# Patient Record
Sex: Female | Born: 1950 | Race: White | Hispanic: No | Marital: Single | Smoking: Current every day smoker
Health system: Southern US, Community
[De-identification: ages and names within clinical notes are randomized; demographics above are authoritative.]

## PROBLEM LIST (undated history)

## (undated) DIAGNOSIS — M797 Fibromyalgia: Secondary | ICD-10-CM

## (undated) DIAGNOSIS — D62 Acute posthemorrhagic anemia: Secondary | ICD-10-CM

## (undated) DIAGNOSIS — Z8701 Personal history of pneumonia (recurrent): Secondary | ICD-10-CM

## (undated) DIAGNOSIS — I499 Cardiac arrhythmia, unspecified: Secondary | ICD-10-CM

## (undated) DIAGNOSIS — R61 Generalized hyperhidrosis: Secondary | ICD-10-CM

## (undated) DIAGNOSIS — I471 Supraventricular tachycardia, unspecified: Secondary | ICD-10-CM

## (undated) DIAGNOSIS — M1990 Unspecified osteoarthritis, unspecified site: Secondary | ICD-10-CM

## (undated) DIAGNOSIS — K922 Gastrointestinal hemorrhage, unspecified: Secondary | ICD-10-CM

## (undated) DIAGNOSIS — J449 Chronic obstructive pulmonary disease, unspecified: Secondary | ICD-10-CM

## (undated) DIAGNOSIS — M545 Low back pain, unspecified: Secondary | ICD-10-CM

## (undated) DIAGNOSIS — I2510 Atherosclerotic heart disease of native coronary artery without angina pectoris: Secondary | ICD-10-CM

## (undated) DIAGNOSIS — J439 Emphysema, unspecified: Secondary | ICD-10-CM

## (undated) DIAGNOSIS — I5189 Other ill-defined heart diseases: Secondary | ICD-10-CM

## (undated) DIAGNOSIS — T8859XA Other complications of anesthesia, initial encounter: Secondary | ICD-10-CM

## (undated) DIAGNOSIS — K5792 Diverticulitis of intestine, part unspecified, without perforation or abscess without bleeding: Secondary | ICD-10-CM

## (undated) DIAGNOSIS — Z973 Presence of spectacles and contact lenses: Secondary | ICD-10-CM

## (undated) DIAGNOSIS — F32A Depression, unspecified: Secondary | ICD-10-CM

## (undated) DIAGNOSIS — G4730 Sleep apnea, unspecified: Secondary | ICD-10-CM

## (undated) DIAGNOSIS — R768 Other specified abnormal immunological findings in serum: Secondary | ICD-10-CM

## (undated) DIAGNOSIS — T4145XA Adverse effect of unspecified anesthetic, initial encounter: Secondary | ICD-10-CM

## (undated) DIAGNOSIS — F410 Panic disorder [episodic paroxysmal anxiety] without agoraphobia: Secondary | ICD-10-CM

## (undated) DIAGNOSIS — Z780 Asymptomatic menopausal state: Secondary | ICD-10-CM

## (undated) DIAGNOSIS — R7689 Other specified abnormal immunological findings in serum: Secondary | ICD-10-CM

## (undated) DIAGNOSIS — I10 Essential (primary) hypertension: Secondary | ICD-10-CM

## (undated) DIAGNOSIS — R0602 Shortness of breath: Secondary | ICD-10-CM

## (undated) DIAGNOSIS — J42 Unspecified chronic bronchitis: Secondary | ICD-10-CM

## (undated) DIAGNOSIS — I7410 Embolism and thrombosis of unspecified parts of aorta: Secondary | ICD-10-CM

## (undated) DIAGNOSIS — T7840XA Allergy, unspecified, initial encounter: Secondary | ICD-10-CM

## (undated) DIAGNOSIS — R0902 Hypoxemia: Secondary | ICD-10-CM

## (undated) DIAGNOSIS — Z9289 Personal history of other medical treatment: Secondary | ICD-10-CM

## (undated) DIAGNOSIS — K449 Diaphragmatic hernia without obstruction or gangrene: Secondary | ICD-10-CM

## (undated) DIAGNOSIS — R519 Headache, unspecified: Secondary | ICD-10-CM

## (undated) DIAGNOSIS — Z9981 Dependence on supplemental oxygen: Secondary | ICD-10-CM

## (undated) DIAGNOSIS — E785 Hyperlipidemia, unspecified: Secondary | ICD-10-CM

## (undated) DIAGNOSIS — F329 Major depressive disorder, single episode, unspecified: Secondary | ICD-10-CM

## (undated) DIAGNOSIS — K5790 Diverticulosis of intestine, part unspecified, without perforation or abscess without bleeding: Secondary | ICD-10-CM

## (undated) DIAGNOSIS — T148XXA Other injury of unspecified body region, initial encounter: Secondary | ICD-10-CM

## (undated) DIAGNOSIS — Z972 Presence of dental prosthetic device (complete) (partial): Secondary | ICD-10-CM

## (undated) DIAGNOSIS — R51 Headache: Secondary | ICD-10-CM

## (undated) DIAGNOSIS — C801 Malignant (primary) neoplasm, unspecified: Secondary | ICD-10-CM

## (undated) DIAGNOSIS — G8929 Other chronic pain: Secondary | ICD-10-CM

## (undated) DIAGNOSIS — B1920 Unspecified viral hepatitis C without hepatic coma: Secondary | ICD-10-CM

## (undated) HISTORY — DX: Hypoxemia: R09.02

## (undated) HISTORY — DX: Generalized hyperhidrosis: R61

## (undated) HISTORY — PX: ANKLE FRACTURE SURGERY: SHX122

## (undated) HISTORY — DX: Presence of spectacles and contact lenses: Z97.3

## (undated) HISTORY — PX: EYE SURGERY: SHX253

## (undated) HISTORY — DX: Sleep apnea, unspecified: G47.30

## (undated) HISTORY — PX: ABDOMINAL HERNIA REPAIR: SHX539

## (undated) HISTORY — DX: Cardiac arrhythmia, unspecified: I49.9

## (undated) HISTORY — DX: Panic disorder (episodic paroxysmal anxiety): F41.0

## (undated) HISTORY — DX: Diverticulitis of intestine, part unspecified, without perforation or abscess without bleeding: K57.92

## (undated) HISTORY — PX: COLON SURGERY: SHX602

## (undated) HISTORY — DX: Asymptomatic menopausal state: Z78.0

## (undated) HISTORY — DX: Diverticulosis of intestine, part unspecified, without perforation or abscess without bleeding: K57.90

## (undated) HISTORY — DX: Emphysema, unspecified: J43.9

## (undated) HISTORY — DX: Allergy, unspecified, initial encounter: T78.40XA

## (undated) HISTORY — DX: Presence of dental prosthetic device (complete) (partial): Z97.2

## (undated) HISTORY — DX: Essential (primary) hypertension: I10

## (undated) HISTORY — DX: Shortness of breath: R06.02

## (undated) HISTORY — DX: Hyperlipidemia, unspecified: E78.5

## (undated) HISTORY — DX: Unspecified osteoarthritis, unspecified site: M19.90

## (undated) HISTORY — DX: Other injury of unspecified body region, initial encounter: T14.8XXA

## (undated) HISTORY — DX: Unspecified viral hepatitis C without hepatic coma: B19.20

## (undated) HISTORY — PX: HERNIA REPAIR: SHX51

## (undated) HISTORY — PX: ABDOMINAL HYSTERECTOMY: SHX81

## (undated) HISTORY — DX: Diaphragmatic hernia without obstruction or gangrene: K44.9

---
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
## 1969-07-11 HISTORY — PX: APPENDECTOMY: SHX54

## 1991-11-11 DIAGNOSIS — B1920 Unspecified viral hepatitis C without hepatic coma: Secondary | ICD-10-CM

## 1991-11-11 HISTORY — DX: Unspecified viral hepatitis C without hepatic coma: B19.20

## 1997-11-10 HISTORY — PX: ELBOW SURGERY: SHX618

## 1999-11-11 DIAGNOSIS — E785 Hyperlipidemia, unspecified: Secondary | ICD-10-CM

## 1999-11-11 DIAGNOSIS — I10 Essential (primary) hypertension: Secondary | ICD-10-CM

## 1999-11-11 DIAGNOSIS — G4730 Sleep apnea, unspecified: Secondary | ICD-10-CM

## 1999-11-11 HISTORY — DX: Hyperlipidemia, unspecified: E78.5

## 1999-11-11 HISTORY — DX: Essential (primary) hypertension: I10

## 1999-11-11 HISTORY — DX: Sleep apnea, unspecified: G47.30

## 2001-11-10 HISTORY — PX: HIATAL HERNIA REPAIR: SHX195

## 2002-11-10 HISTORY — PX: FRACTURE SURGERY: SHX138

## 2003-12-12 ENCOUNTER — Other Ambulatory Visit: Admission: RE | Admit: 2003-12-12 | Discharge: 2003-12-12 | Payer: Self-pay | Admitting: Dermatology

## 2004-09-25 ENCOUNTER — Encounter: Payer: Self-pay | Admitting: Occupational Therapy

## 2004-10-10 ENCOUNTER — Encounter: Payer: Self-pay | Admitting: Occupational Therapy

## 2004-10-15 ENCOUNTER — Ambulatory Visit: Payer: Self-pay | Admitting: Occupational Therapy

## 2004-11-10 DIAGNOSIS — T148XXA Other injury of unspecified body region, initial encounter: Secondary | ICD-10-CM

## 2004-11-10 HISTORY — DX: Other injury of unspecified body region, initial encounter: T14.8XXA

## 2005-01-08 HISTORY — PX: TOTAL ABDOMINAL HYSTERECTOMY W/ BILATERAL SALPINGOOPHORECTOMY: SHX83

## 2005-01-12 ENCOUNTER — Emergency Department (HOSPITAL_COMMUNITY): Admission: EM | Admit: 2005-01-12 | Discharge: 2005-01-12 | Payer: Self-pay | Admitting: Obstetrics and Gynecology

## 2005-02-06 ENCOUNTER — Inpatient Hospital Stay (HOSPITAL_COMMUNITY): Admission: RE | Admit: 2005-02-06 | Discharge: 2005-02-08 | Payer: Self-pay | Admitting: Obstetrics and Gynecology

## 2005-03-06 ENCOUNTER — Encounter: Payer: Self-pay | Admitting: Orthopaedic Surgery

## 2005-03-10 ENCOUNTER — Encounter: Payer: Self-pay | Admitting: Orthopaedic Surgery

## 2005-08-05 ENCOUNTER — Ambulatory Visit (HOSPITAL_COMMUNITY): Admission: RE | Admit: 2005-08-05 | Discharge: 2005-08-05 | Payer: Self-pay | Admitting: Occupational Therapy

## 2005-09-11 ENCOUNTER — Emergency Department (HOSPITAL_COMMUNITY): Admission: EM | Admit: 2005-09-11 | Discharge: 2005-09-11 | Payer: Self-pay | Admitting: Emergency Medicine

## 2005-12-23 ENCOUNTER — Ambulatory Visit (HOSPITAL_COMMUNITY): Admission: RE | Admit: 2005-12-23 | Discharge: 2005-12-23 | Payer: Self-pay | Admitting: Family Medicine

## 2006-08-14 ENCOUNTER — Ambulatory Visit (HOSPITAL_COMMUNITY): Admission: EM | Admit: 2006-08-14 | Discharge: 2006-08-14 | Payer: Self-pay | Admitting: Emergency Medicine

## 2006-08-26 ENCOUNTER — Ambulatory Visit (HOSPITAL_COMMUNITY): Admission: RE | Admit: 2006-08-26 | Discharge: 2006-08-26 | Payer: Self-pay | Admitting: Family Medicine

## 2006-10-13 ENCOUNTER — Ambulatory Visit (HOSPITAL_COMMUNITY): Admission: RE | Admit: 2006-10-13 | Discharge: 2006-10-13 | Payer: Self-pay | Admitting: Family Medicine

## 2006-11-10 DIAGNOSIS — K5790 Diverticulosis of intestine, part unspecified, without perforation or abscess without bleeding: Secondary | ICD-10-CM

## 2006-11-10 HISTORY — DX: Diverticulosis of intestine, part unspecified, without perforation or abscess without bleeding: K57.90

## 2007-03-23 ENCOUNTER — Ambulatory Visit (HOSPITAL_COMMUNITY): Admission: RE | Admit: 2007-03-23 | Discharge: 2007-03-23 | Payer: Self-pay | Admitting: Family Medicine

## 2007-04-19 ENCOUNTER — Ambulatory Visit (HOSPITAL_COMMUNITY): Admission: RE | Admit: 2007-04-19 | Discharge: 2007-04-19 | Payer: Self-pay | Admitting: Family Medicine

## 2007-11-02 ENCOUNTER — Ambulatory Visit (HOSPITAL_COMMUNITY): Admission: RE | Admit: 2007-11-02 | Discharge: 2007-11-02 | Payer: Self-pay | Admitting: Otolaryngology

## 2007-11-11 HISTORY — PX: OTHER SURGICAL HISTORY: SHX169

## 2007-11-11 HISTORY — PX: PARTIAL COLECTOMY: SHX5273

## 2008-01-09 HISTORY — PX: ESOPHAGOGASTRODUODENOSCOPY: SHX1529

## 2008-01-14 ENCOUNTER — Ambulatory Visit: Payer: Self-pay | Admitting: Gastroenterology

## 2008-01-19 ENCOUNTER — Encounter: Payer: Self-pay | Admitting: Gastroenterology

## 2008-01-19 ENCOUNTER — Ambulatory Visit (HOSPITAL_COMMUNITY): Admission: RE | Admit: 2008-01-19 | Discharge: 2008-01-19 | Payer: Self-pay | Admitting: Gastroenterology

## 2008-01-19 ENCOUNTER — Ambulatory Visit: Payer: Self-pay | Admitting: Gastroenterology

## 2008-01-21 ENCOUNTER — Ambulatory Visit: Payer: Self-pay | Admitting: Internal Medicine

## 2008-01-27 ENCOUNTER — Ambulatory Visit: Payer: Self-pay | Admitting: Gastroenterology

## 2008-06-04 ENCOUNTER — Emergency Department (HOSPITAL_COMMUNITY): Admission: EM | Admit: 2008-06-04 | Discharge: 2008-06-04 | Payer: Self-pay | Admitting: Emergency Medicine

## 2008-06-27 ENCOUNTER — Ambulatory Visit: Payer: Self-pay | Admitting: Gastroenterology

## 2008-07-04 ENCOUNTER — Ambulatory Visit (HOSPITAL_COMMUNITY): Admission: RE | Admit: 2008-07-04 | Discharge: 2008-07-04 | Payer: Self-pay | Admitting: Gastroenterology

## 2008-07-11 HISTORY — PX: COLONOSCOPY: SHX174

## 2008-07-14 ENCOUNTER — Ambulatory Visit: Payer: Self-pay | Admitting: Gastroenterology

## 2008-07-14 ENCOUNTER — Ambulatory Visit (HOSPITAL_COMMUNITY): Admission: RE | Admit: 2008-07-14 | Discharge: 2008-07-14 | Payer: Self-pay | Admitting: Gastroenterology

## 2008-07-14 ENCOUNTER — Encounter: Payer: Self-pay | Admitting: Gastroenterology

## 2008-07-14 LAB — HM COLONOSCOPY

## 2008-08-07 ENCOUNTER — Inpatient Hospital Stay (HOSPITAL_COMMUNITY): Admission: RE | Admit: 2008-08-07 | Discharge: 2008-08-12 | Payer: Self-pay | Admitting: General Surgery

## 2008-08-07 ENCOUNTER — Encounter (INDEPENDENT_AMBULATORY_CARE_PROVIDER_SITE_OTHER): Payer: Self-pay | Admitting: General Surgery

## 2008-09-18 DIAGNOSIS — R1310 Dysphagia, unspecified: Secondary | ICD-10-CM | POA: Insufficient documentation

## 2008-09-18 DIAGNOSIS — G4733 Obstructive sleep apnea (adult) (pediatric): Secondary | ICD-10-CM

## 2008-09-18 DIAGNOSIS — Z8619 Personal history of other infectious and parasitic diseases: Secondary | ICD-10-CM | POA: Insufficient documentation

## 2008-09-18 DIAGNOSIS — M129 Arthropathy, unspecified: Secondary | ICD-10-CM | POA: Insufficient documentation

## 2008-09-18 DIAGNOSIS — E785 Hyperlipidemia, unspecified: Secondary | ICD-10-CM

## 2008-09-18 DIAGNOSIS — K2970 Gastritis, unspecified, without bleeding: Secondary | ICD-10-CM | POA: Insufficient documentation

## 2008-09-18 DIAGNOSIS — J45909 Unspecified asthma, uncomplicated: Secondary | ICD-10-CM | POA: Insufficient documentation

## 2008-09-18 DIAGNOSIS — Z8679 Personal history of other diseases of the circulatory system: Secondary | ICD-10-CM | POA: Insufficient documentation

## 2008-09-18 DIAGNOSIS — K449 Diaphragmatic hernia without obstruction or gangrene: Secondary | ICD-10-CM | POA: Insufficient documentation

## 2008-09-18 DIAGNOSIS — K219 Gastro-esophageal reflux disease without esophagitis: Secondary | ICD-10-CM | POA: Insufficient documentation

## 2008-09-25 ENCOUNTER — Emergency Department (HOSPITAL_COMMUNITY): Admission: EM | Admit: 2008-09-25 | Discharge: 2008-09-25 | Payer: Self-pay | Admitting: Emergency Medicine

## 2008-12-06 ENCOUNTER — Ambulatory Visit: Payer: Self-pay | Admitting: Family Medicine

## 2008-12-06 DIAGNOSIS — F1011 Alcohol abuse, in remission: Secondary | ICD-10-CM | POA: Insufficient documentation

## 2008-12-06 DIAGNOSIS — B1710 Acute hepatitis C without hepatic coma: Secondary | ICD-10-CM

## 2008-12-06 DIAGNOSIS — R5383 Other fatigue: Secondary | ICD-10-CM

## 2008-12-06 DIAGNOSIS — M79609 Pain in unspecified limb: Secondary | ICD-10-CM | POA: Insufficient documentation

## 2008-12-06 DIAGNOSIS — R519 Headache, unspecified: Secondary | ICD-10-CM | POA: Insufficient documentation

## 2008-12-06 DIAGNOSIS — F17200 Nicotine dependence, unspecified, uncomplicated: Secondary | ICD-10-CM

## 2008-12-06 DIAGNOSIS — R51 Headache: Secondary | ICD-10-CM

## 2008-12-06 DIAGNOSIS — R5381 Other malaise: Secondary | ICD-10-CM | POA: Insufficient documentation

## 2008-12-06 DIAGNOSIS — I10 Essential (primary) hypertension: Secondary | ICD-10-CM | POA: Insufficient documentation

## 2008-12-11 ENCOUNTER — Telehealth: Payer: Self-pay | Admitting: Family Medicine

## 2008-12-14 ENCOUNTER — Ambulatory Visit: Payer: Self-pay | Admitting: Family Medicine

## 2008-12-14 DIAGNOSIS — G4700 Insomnia, unspecified: Secondary | ICD-10-CM

## 2008-12-14 DIAGNOSIS — F411 Generalized anxiety disorder: Secondary | ICD-10-CM | POA: Insufficient documentation

## 2008-12-18 ENCOUNTER — Encounter: Payer: Self-pay | Admitting: Family Medicine

## 2008-12-19 LAB — CONVERTED CEMR LAB
AST: 17 units/L (ref 0–37)
Albumin: 4.5 g/dL (ref 3.5–5.2)
Alkaline Phosphatase: 75 units/L (ref 39–117)
BUN: 12 mg/dL (ref 6–23)
Basophils Absolute: 0 10*3/uL (ref 0.0–0.1)
Basophils Relative: 0 % (ref 0–1)
Bilirubin, Direct: 0.1 mg/dL (ref 0.0–0.3)
CO2: 23 meq/L (ref 19–32)
Calcium: 9.8 mg/dL (ref 8.4–10.5)
Cholesterol: 188 mg/dL (ref 0–200)
Creatinine, Ser: 0.71 mg/dL (ref 0.40–1.20)
Eosinophils Relative: 2 % (ref 0–5)
HCT: 45.9 % (ref 36.0–46.0)
HDL: 47 mg/dL (ref >39)
LDL Cholesterol: 104 mg/dL — ABNORMAL HIGH (ref 0–99)
Lymphocytes Relative: 18 % (ref 12–46)
MCHC: 32.5 g/dL (ref 30.0–36.0)
Monocytes Absolute: 0.9 10*3/uL (ref 0.1–1.0)
RBC: 4.86 M/uL (ref 3.87–5.11)
RDW: 15.1 % (ref 11.5–15.5)
Sodium: 141 meq/L (ref 135–145)
TSH: 2.128 microintl units/mL (ref 0.350–4.50)
Total CHOL/HDL Ratio: 4
Triglycerides: 187 mg/dL — ABNORMAL HIGH (ref <150)

## 2008-12-26 ENCOUNTER — Encounter: Payer: Self-pay | Admitting: Family Medicine

## 2009-01-01 ENCOUNTER — Ambulatory Visit (HOSPITAL_COMMUNITY): Admission: RE | Admit: 2009-01-01 | Discharge: 2009-01-01 | Payer: Self-pay | Admitting: Neurology

## 2009-01-02 ENCOUNTER — Encounter: Payer: Self-pay | Admitting: Family Medicine

## 2009-01-17 ENCOUNTER — Encounter: Payer: Self-pay | Admitting: Family Medicine

## 2009-01-31 ENCOUNTER — Ambulatory Visit (HOSPITAL_COMMUNITY): Admission: RE | Admit: 2009-01-31 | Discharge: 2009-01-31 | Payer: Self-pay | Admitting: General Surgery

## 2009-01-31 ENCOUNTER — Encounter: Payer: Self-pay | Admitting: Family Medicine

## 2009-01-31 HISTORY — PX: UMBILICAL HERNIA REPAIR: SHX196

## 2009-02-16 ENCOUNTER — Ambulatory Visit: Payer: Self-pay | Admitting: Family Medicine

## 2009-02-26 ENCOUNTER — Encounter: Payer: Self-pay | Admitting: Family Medicine

## 2009-04-11 ENCOUNTER — Ambulatory Visit: Payer: Self-pay | Admitting: Family Medicine

## 2009-04-11 DIAGNOSIS — M2550 Pain in unspecified joint: Secondary | ICD-10-CM | POA: Insufficient documentation

## 2009-04-11 DIAGNOSIS — IMO0001 Reserved for inherently not codable concepts without codable children: Secondary | ICD-10-CM | POA: Insufficient documentation

## 2009-04-16 LAB — CONVERTED CEMR LAB
ALT: 9 units/L (ref 0–35)
AST: 14 units/L (ref 0–37)
Albumin: 4.2 g/dL (ref 3.5–5.2)
Alkaline Phosphatase: 64 units/L (ref 39–117)
Bilirubin, Direct: <0.1 mg/dL (ref 0.0–0.3)
Chloride: 106 meq/L (ref 96–112)
Magnesium: 1.9 mg/dL (ref 1.5–2.5)
Rhuematoid fact SerPl-aCnc: <20 intl units/mL (ref 0–20)
TSH: 1.033 microintl units/mL (ref 0.350–4.500)
Total Bilirubin: 0.3 mg/dL (ref 0.3–1.2)
Total Protein: 7.1 g/dL (ref 6.0–8.3)

## 2009-04-17 ENCOUNTER — Ambulatory Visit: Payer: Self-pay | Admitting: Family Medicine

## 2009-04-17 DIAGNOSIS — E739 Lactose intolerance, unspecified: Secondary | ICD-10-CM

## 2009-06-13 ENCOUNTER — Encounter: Payer: Self-pay | Admitting: Family Medicine

## 2009-06-14 LAB — CONVERTED CEMR LAB
ALT: 12 units/L (ref 0–35)
Albumin: 4.4 g/dL (ref 3.5–5.2)
Cholesterol: 140 mg/dL (ref 0–200)
HDL: 46 mg/dL (ref >39)
Total CHOL/HDL Ratio: 3
Total Protein: 7 g/dL (ref 6.0–8.3)
Triglycerides: 108 mg/dL (ref <150)
VLDL: 22 mg/dL (ref 0–40)

## 2009-06-18 ENCOUNTER — Ambulatory Visit: Payer: Self-pay | Admitting: Family Medicine

## 2009-07-12 ENCOUNTER — Encounter: Payer: Self-pay | Admitting: Family Medicine

## 2009-08-03 ENCOUNTER — Encounter: Payer: Self-pay | Admitting: Orthopedic Surgery

## 2009-08-20 ENCOUNTER — Ambulatory Visit: Payer: Self-pay | Admitting: Family Medicine

## 2009-08-20 ENCOUNTER — Telehealth: Payer: Self-pay | Admitting: Family Medicine

## 2009-08-20 LAB — CONVERTED CEMR LAB: Hgb A1c MFr Bld: 6.1 %

## 2009-09-12 ENCOUNTER — Ambulatory Visit: Payer: Self-pay | Admitting: Family Medicine

## 2009-09-12 DIAGNOSIS — T7840XA Allergy, unspecified, initial encounter: Secondary | ICD-10-CM | POA: Insufficient documentation

## 2009-10-19 ENCOUNTER — Encounter (INDEPENDENT_AMBULATORY_CARE_PROVIDER_SITE_OTHER): Payer: Self-pay | Admitting: *Deleted

## 2009-10-19 LAB — CONVERTED CEMR LAB
ALT: 11 units/L (ref 0–35)
AST: 15 units/L (ref 0–37)
Albumin: 4.7 g/dL
Bilirubin, Direct: 0.1 mg/dL
Bilirubin, Direct: 0.1 mg/dL (ref 0.0–0.3)
CO2: 25 meq/L
Calcium: 10 mg/dL (ref 8.4–10.5)
Cholesterol: 160 mg/dL
Cholesterol: 160 mg/dL (ref 0–200)
Glucose, Bld: 93 mg/dL
Glucose, Bld: 93 mg/dL (ref 70–99)
HDL: 53 mg/dL (ref >39)
Sodium: 141 meq/L
Sodium: 141 meq/L (ref 135–145)
Total CHOL/HDL Ratio: 3
Total Protein: 7.2 g/dL
Triglycerides: 99 mg/dL (ref <150)

## 2009-10-22 ENCOUNTER — Ambulatory Visit: Payer: Self-pay | Admitting: Family Medicine

## 2009-10-22 DIAGNOSIS — R209 Unspecified disturbances of skin sensation: Secondary | ICD-10-CM | POA: Insufficient documentation

## 2009-10-22 DIAGNOSIS — M542 Cervicalgia: Secondary | ICD-10-CM | POA: Insufficient documentation

## 2009-10-22 DIAGNOSIS — G8929 Other chronic pain: Secondary | ICD-10-CM | POA: Insufficient documentation

## 2009-10-23 ENCOUNTER — Encounter: Payer: Self-pay | Admitting: Family Medicine

## 2009-10-31 ENCOUNTER — Encounter: Payer: Self-pay | Admitting: Family Medicine

## 2009-11-08 ENCOUNTER — Encounter: Payer: Self-pay | Admitting: Family Medicine

## 2009-11-12 ENCOUNTER — Ambulatory Visit: Payer: Self-pay | Admitting: Family Medicine

## 2009-11-12 DIAGNOSIS — R002 Palpitations: Secondary | ICD-10-CM

## 2009-11-14 ENCOUNTER — Ambulatory Visit (HOSPITAL_COMMUNITY): Admission: RE | Admit: 2009-11-14 | Discharge: 2009-11-14 | Payer: Self-pay | Admitting: Family Medicine

## 2009-11-19 ENCOUNTER — Encounter: Payer: Self-pay | Admitting: Family Medicine

## 2009-11-26 ENCOUNTER — Encounter: Payer: Self-pay | Admitting: Family Medicine

## 2009-12-03 ENCOUNTER — Encounter (INDEPENDENT_AMBULATORY_CARE_PROVIDER_SITE_OTHER): Payer: Self-pay | Admitting: *Deleted

## 2009-12-06 ENCOUNTER — Encounter (INDEPENDENT_AMBULATORY_CARE_PROVIDER_SITE_OTHER): Payer: Self-pay | Admitting: *Deleted

## 2009-12-06 ENCOUNTER — Ambulatory Visit (HOSPITAL_COMMUNITY): Admission: RE | Admit: 2009-12-06 | Discharge: 2009-12-06 | Payer: Self-pay | Admitting: Cardiovascular Disease

## 2009-12-06 ENCOUNTER — Ambulatory Visit: Payer: Self-pay | Admitting: Cardiovascular Disease

## 2009-12-06 DIAGNOSIS — R079 Chest pain, unspecified: Secondary | ICD-10-CM | POA: Insufficient documentation

## 2009-12-17 ENCOUNTER — Telehealth: Payer: Self-pay | Admitting: Family Medicine

## 2009-12-19 ENCOUNTER — Telehealth (INDEPENDENT_AMBULATORY_CARE_PROVIDER_SITE_OTHER): Payer: Self-pay | Admitting: *Deleted

## 2009-12-24 ENCOUNTER — Telehealth: Payer: Self-pay | Admitting: Family Medicine

## 2009-12-24 ENCOUNTER — Telehealth (INDEPENDENT_AMBULATORY_CARE_PROVIDER_SITE_OTHER): Payer: Self-pay | Admitting: *Deleted

## 2009-12-26 ENCOUNTER — Encounter: Payer: Self-pay | Admitting: Cardiovascular Disease

## 2009-12-31 ENCOUNTER — Telehealth (INDEPENDENT_AMBULATORY_CARE_PROVIDER_SITE_OTHER): Payer: Self-pay | Admitting: *Deleted

## 2010-01-02 ENCOUNTER — Ambulatory Visit: Payer: Self-pay | Admitting: Cardiology

## 2010-01-03 ENCOUNTER — Encounter: Payer: Self-pay | Admitting: Cardiovascular Disease

## 2010-02-27 ENCOUNTER — Ambulatory Visit: Payer: Self-pay | Admitting: Cardiology

## 2010-03-06 ENCOUNTER — Telehealth (INDEPENDENT_AMBULATORY_CARE_PROVIDER_SITE_OTHER): Payer: Self-pay

## 2010-03-18 ENCOUNTER — Ambulatory Visit: Payer: Self-pay | Admitting: Cardiology

## 2010-03-19 ENCOUNTER — Encounter: Payer: Self-pay | Admitting: Adult Health

## 2010-03-19 ENCOUNTER — Encounter: Payer: Self-pay | Admitting: Cardiovascular Disease

## 2010-04-15 ENCOUNTER — Ambulatory Visit: Payer: Self-pay | Admitting: Orthopedic Surgery

## 2010-04-15 DIAGNOSIS — M19079 Primary osteoarthritis, unspecified ankle and foot: Secondary | ICD-10-CM | POA: Insufficient documentation

## 2010-06-24 ENCOUNTER — Ambulatory Visit (HOSPITAL_COMMUNITY): Admission: RE | Admit: 2010-06-24 | Discharge: 2010-06-24 | Payer: Self-pay | Admitting: Neurology

## 2010-06-24 ENCOUNTER — Ambulatory Visit: Payer: Self-pay | Admitting: Family Medicine

## 2010-06-24 DIAGNOSIS — L259 Unspecified contact dermatitis, unspecified cause: Secondary | ICD-10-CM | POA: Insufficient documentation

## 2010-06-24 DIAGNOSIS — G459 Transient cerebral ischemic attack, unspecified: Secondary | ICD-10-CM | POA: Insufficient documentation

## 2010-07-08 ENCOUNTER — Encounter: Payer: Self-pay | Admitting: Family Medicine

## 2010-07-09 LAB — CONVERTED CEMR LAB
Albumin: 4.3 g/dL (ref 3.5–5.2)
CO2: 26 meq/L (ref 19–32)
Chloride: 104 meq/L (ref 96–112)
Cholesterol: 184 mg/dL (ref 0–200)
Glucose, Bld: 84 mg/dL (ref 70–99)
MCV: 96.1 fL (ref 78.0–100.0)
Potassium: 4.5 meq/L (ref 3.5–5.3)
RBC: 4.66 M/uL (ref 3.87–5.11)
Sodium: 141 meq/L (ref 135–145)
Total Protein: 6.8 g/dL (ref 6.0–8.3)
Triglycerides: 151 mg/dL — ABNORMAL HIGH (ref <150)
WBC: 10.7 10*3/uL — ABNORMAL HIGH (ref 4.0–10.5)

## 2010-07-22 ENCOUNTER — Ambulatory Visit: Payer: Self-pay | Admitting: Family Medicine

## 2010-07-22 ENCOUNTER — Ambulatory Visit (HOSPITAL_COMMUNITY)
Admission: RE | Admit: 2010-07-22 | Discharge: 2010-07-22 | Payer: Self-pay | Source: Home / Self Care | Admitting: Neurology

## 2010-07-22 DIAGNOSIS — L600 Ingrowing nail: Secondary | ICD-10-CM | POA: Insufficient documentation

## 2010-07-22 DIAGNOSIS — R7301 Impaired fasting glucose: Secondary | ICD-10-CM | POA: Insufficient documentation

## 2010-07-31 ENCOUNTER — Ambulatory Visit: Payer: Self-pay | Admitting: Family Medicine

## 2010-07-31 DIAGNOSIS — L509 Urticaria, unspecified: Secondary | ICD-10-CM | POA: Insufficient documentation

## 2010-08-06 ENCOUNTER — Encounter: Payer: Self-pay | Admitting: Family Medicine

## 2010-08-21 ENCOUNTER — Encounter: Payer: Self-pay | Admitting: Family Medicine

## 2010-09-30 ENCOUNTER — Telehealth: Payer: Self-pay | Admitting: Family Medicine

## 2010-10-02 LAB — CONVERTED CEMR LAB
ALT: 13 units/L (ref 0–35)
Albumin: 4.4 g/dL (ref 3.5–5.2)
Alkaline Phosphatase: 63 units/L (ref 39–117)
HDL: 48 mg/dL (ref >39)
LDL Cholesterol: 93 mg/dL (ref 0–99)
Total Protein: 7 g/dL (ref 6.0–8.3)
Triglycerides: 73 mg/dL (ref <150)
VLDL: 15 mg/dL (ref 0–40)

## 2010-10-11 ENCOUNTER — Ambulatory Visit: Payer: Self-pay | Admitting: Family Medicine

## 2010-10-14 ENCOUNTER — Telehealth: Payer: Self-pay | Admitting: Family Medicine

## 2010-10-17 ENCOUNTER — Encounter (INDEPENDENT_AMBULATORY_CARE_PROVIDER_SITE_OTHER): Payer: Self-pay | Admitting: *Deleted

## 2010-10-17 ENCOUNTER — Encounter: Payer: Self-pay | Admitting: Family Medicine

## 2010-10-18 ENCOUNTER — Encounter (INDEPENDENT_AMBULATORY_CARE_PROVIDER_SITE_OTHER): Payer: Self-pay | Admitting: *Deleted

## 2010-10-21 ENCOUNTER — Encounter: Payer: Self-pay | Admitting: Family Medicine

## 2010-10-21 ENCOUNTER — Ambulatory Visit
Admission: RE | Admit: 2010-10-21 | Discharge: 2010-10-21 | Payer: Self-pay | Source: Home / Self Care | Attending: Family Medicine | Admitting: Family Medicine

## 2010-10-23 ENCOUNTER — Encounter: Payer: Self-pay | Admitting: Family Medicine

## 2010-10-25 ENCOUNTER — Ambulatory Visit: Payer: Self-pay | Admitting: Family Medicine

## 2010-10-31 ENCOUNTER — Encounter: Payer: Self-pay | Admitting: Family Medicine

## 2010-11-08 LAB — HM MAMMOGRAPHY

## 2010-11-08 LAB — HM COLONOSCOPY

## 2010-11-18 ENCOUNTER — Telehealth: Payer: Self-pay | Admitting: Family Medicine

## 2010-11-20 ENCOUNTER — Encounter: Payer: Self-pay | Admitting: Family Medicine

## 2010-11-26 ENCOUNTER — Telehealth: Payer: Self-pay | Admitting: Family Medicine

## 2010-12-01 ENCOUNTER — Encounter: Payer: Self-pay | Admitting: Cardiovascular Disease

## 2010-12-02 ENCOUNTER — Encounter: Payer: Self-pay | Admitting: Family Medicine

## 2010-12-10 NOTE — Progress Notes (Signed)
Summary: TEST  Phone Note Call from Patient   Summary of Call: CALL HER ABOUT HER STRESS TEST  CEL # [phone]  HOME [phone] Initial call taken by: Lind Guest,  December 24, 2009 8:38 AM  Follow-up for Phone Call        advised patient to call cardiologist for any questions reguarding stress test Follow-up by: Lilyan Gilford LPN,  December 24, 2009 8:43 AM

## 2010-12-10 NOTE — Progress Notes (Signed)
Summary: TREADMILL  Phone Note Call from Patient Call back at Home Phone [phone]   Caller: PT Reason for Call: Talk to Nurse Summary of Call: PT IS SCHEDULED FOR LEXISCAN ON FRIDAY AND WOULD LIKE TO DO REG TREADMILL WITH NO CHEMICALS IS THIS OKAY? Initial call taken by: Faythe Ghee,  December 24, 2009 2:38 PM  Follow-up for Phone Call        pt is unable to make her appt for NM stress on Friday, would like to r/s a gxt instead. Follow-up by: Teressa Lower RN,  December 24, 2009 3:45 PM  Additional Follow-up for Phone Call Additional follow up Details #1::        yes that is fine for her to try Additional Follow-up by: Colon Branch, MD, [facility],  December 26, 2009 10:31 AM

## 2010-12-10 NOTE — Progress Notes (Signed)
Summary: med  Phone Note Call from Patient   Summary of Call: needs to speak with nurse about two meds that insur. want pay. [phone]  OR [phone] Initial call taken by: Rudene Anda,  December 17, 2009 2:49 PM  Follow-up for Phone Call        Francine Graven will not pay for bystolic and zetia. please choose a generic alternative Follow-up by: Worthy Keeler LPN,  December 17, 2009 3:06 PM  Additional Follow-up for Phone Call Additional follow up Details #1::        advise there is no generic fer zertia, but i see where she is on simvastatin continue this only and review labs in 3 months, if she absolutely wants a replacement then a trial of niaspan 500mg  one at bedtime would be appropriate #30 refill 4 along with the simvastatin. As for the carvdelilol, I suggest she has the cardiologist substitute since he is currently assessing her palpitations and recently stopped the atenolol Additional Follow-up by: Syliva Overman MD,  December 18, 2009 5:40 AM    Additional Follow-up for Phone Call Additional follow up Details #2::    will just stay on simvastatin and check labs in three months goes to cardiologist tomorrow and will check with him about the bystolic Follow-up by: Worthy Keeler LPN,  December 18, 2009 2:38 PM

## 2010-12-10 NOTE — Letter (Signed)
Summary: Letter  Letter   Imported By: Lind Guest 11/22/2009 08:17:44  _____________________________________________________________________  External Attachment:    Type:   Image     Comment:   External Document

## 2010-12-10 NOTE — Progress Notes (Signed)
Summary: simvastin  Phone Note Call from Patient   Summary of Call: [name] in [phone] in [place], she needs her simvastin called in. Initial call taken by: Curtis Sites,  September 30, 2010 4:24 PM  Follow-up for Phone Call        she has no more pills    Follow-up by: Lind Guest,  October 01, 2010 2:12 PM    Prescriptions: SIMVASTATIN 40 MG TABS (SIMVASTATIN) Take 1 tablet by mouth once a day  #30 Each x 0   Entered by:   Adella Hare LPN   Authorized by:   Syliva Overman MD   Signed by:   Adella Hare LPN on 45/40/9811   Method used:   Electronically to        [facility] (retail)       [address]       [place], [place]  [address]       Ph: [phone]       Fax: [phone]   RxID:   [id number]

## 2010-12-10 NOTE — Letter (Signed)
[facility]: [facility]   Imported By: Lind Guest 10/18/2010 13:32:51  _____________________________________________________________________  External Attachment:    Type:   Image     Comment:   External Document

## 2010-12-10 NOTE — Assessment & Plan Note (Signed)
Summary: nurse visit walk in/tmj  Nurse Visit   Vital Signs:  Patient profile:   60 year old female Menstrual status:  hysterectomy O2 Sat:      96 % on Room air Pulse (ortho):   112 / minute BP standing:   129 / 82  Vitals Entered By: Teressa Lower RN (January 02, 2010 9:38 AM)  O2 Flow:  Room air  Serial Vital Signs/Assessments:  Time      Position  BP       Pulse  Resp  Temp     By 9:38 AM   Lying LA  142/79   97                    Glendoris Nodarse RN 9:38 AM   Sitting   139/88   98                    Mennie Spiller RN 9:38 AM   Standing  129/82   112                   Jazzelle Zhang RN   Current Medications (verified): 1)  Simvastatin 40 Mg Tabs (Simvastatin) .... Take 1 Tablet By Mouth Once A Day 2)  Metoprolol Succinate 50 Mg Xr24h-Tab (Metoprolol Succinate) .... Take One Tablet By Mouth Daily 3)  Halcion 0.25 Mg Tabs (Triazolam) .... Take 1 Tab By Mouth At Bedtime 4)  Hydrocodone-Acetaminophen 5-500 Mg Tabs (Hydrocodone-Acetaminophen) .... One Tab By Mouth Every Four To Six Hours As Needed Pain 5)  Zetia 10 Mg Tabs (Ezetimibe) .... Take 1 Tab By Mouth At Bedtime 6)  Epipen 2-Pak 0.3 Mg/0.27ml Devi (Epinephrine) .... 0.15mg  Im X 1 Dose For An Acuteseverer Allergic Reaction 7)  Polysporin 500-10000 Unit/gm Oint (Bacitracin-Polymyxin B) .... Apply Thin Strip To Each Eye Bid 8)  Flexeril 10 Mg Tabs (Cyclobenzaprine Hcl) .... One Tab Q 8 Hrs As Needed  Allergies (verified): 1)  ! Steroids 2)  ! Ultram 3)  ! Codeine 4)  ! * Willo 5)  ! Bayer Aspirin (Aspirin)  Patient Instructions: 1)  Took pt to ED for further evaluation
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
## 2010-12-10 NOTE — Miscellaneous (Signed)
Summary: Orders Update  Clinical Lists Changes  Orders: Added new Referral order of Treadmill (Treadmill) - Signed  Appended Document: Orders Update GXT 01/09/10

## 2010-12-10 NOTE — Letter (Signed)
Summary: Cement Results Engineer, agricultural at [facility]  [address] [place] [address]   Phone: [phone]  Fax: [phone]      December 06, 2009 MRN: [id number]   [facility] [address] [place], [place]  [address]   Dear Ms. Looney,  Your test ordered by Selena Batten has been reviewed by your physician (or physician assistant) and was found to be normal or stable. Your physician (or physician assistant) felt no changes were needed at this time.  ____ Echocardiogram  ____ Cardiac Stress Test  ____ Lab Work  ____ Peripheral vascular study of arms, legs or neck  __X__ CT scan or X-ray  ____ Lung or Breathing test  ____ Other: Please continue on current medical treatment.  Thank you.   Charlton Haws, MD, F.A.C.C

## 2010-12-10 NOTE — Progress Notes (Signed)
Summary: rx replacement  Phone Note Call from Patient Call back at Home Phone [phone] Call back at [phone]   Caller: pt walk in Reason for Call: Refill Medication Summary of Call: pt insurance will not cover bystolic any longer is there any other med she can take in place of it? Initial call taken by: Faythe Ghee,  December 19, 2009 9:35 AM  Follow-up for Phone Call        pt was seen by you on 12/06/2009, please recommend at cheaper replacement for her bystolic  Follow-up by: Teressa Lower RN,  December 20, 2009 11:06 AM  Additional Follow-up for Phone Call Additional follow up Details #1::        Topol 50 mg would be ok Additional Follow-up by: Colon Branch, MD, [facility],  December 21, 2009 5:58 PM     Appended Document: rx replacement toprol rx    Clinical Lists Changes  Medications: Changed medication from BYSTOLIC 10 MG TABS (NEBIVOLOL HCL) one tab by mouth qd to METOPROLOL SUCCINATE 50 MG XR24H-TAB (METOPROLOL SUCCINATE) Take one tablet by mouth daily - Signed Rx of METOPROLOL SUCCINATE 50 MG XR24H-TAB (METOPROLOL SUCCINATE) Take one tablet by mouth daily;  #30 x 6;  Signed;  Entered by: Teressa Lower RN;  Authorized by: Colon Branch, MD, [facility];  Method used: Electronically to [facility] [address] [place], [place], [place]  [address], Ph: [phone], Fax: [phone]    Prescriptions: METOPROLOL SUCCINATE 50 MG XR24H-TAB (METOPROLOL SUCCINATE) Take one tablet by mouth daily  #30 x 6   Entered by:   Teressa Lower RN   Authorized by:   Colon Branch, MD, [facility]   Signed by:   Teressa Lower RN on 12/24/2009   Method used:   Electronically to        [facility] (retail)       [address]       [place], [place]  [address]       Ph: [phone]       Fax: [phone]   RxID:   [id number]

## 2010-12-10 NOTE — Progress Notes (Signed)
Summary: allergy & asthma  allergy & asthma   Imported By: Lind Guest 01/04/2010 15:17:23  _____________________________________________________________________  External Attachment:    Type:   Image     Comment:   External Document

## 2010-12-10 NOTE — Assessment & Plan Note (Signed)
Summary: OV   Vital Signs:  Patient profile:   60 year old female Menstrual status:  hysterectomy Height:      63 inches Weight:      132.08 pounds BMI:     23.48 O2 Sat:      97 % on Room air Pulse rate:   87 / minute BP sitting:   122 / 60  (left arm) CC: [facility] thuoght she had a stroke last Monday, blood pressure was low, and she had a Carotid  ultra sound this morning/thinks she has posion ivy in blood stream/room 1 Is Patient Diabetic? No   Referring Ridley Dileo:  Dr. Gerilyn Pilgrim Primary Nivek Powley:  Dr.Margaret Lodema Hong  CC:  [facility] thuoght she had a stroke last Monday, blood pressure was low, and and she had a Carotid  ultra sound this morning/thinks she has posion ivy in blood stream/room 1.  History of Present Illness: Pt reports that over a aweek ago she had an episode while outside working in her yard.  She felt like the Rt ear and Rt side of her face/ cheek area became cold and numb.  Then she developed numbness of her RUE.  Few minutes later she had numbness and weakness in her RLE also.  She had difficulty walking.  She denies HA.  A friend was with her and she had difficulty speaking, though she states her thoughts were clear.  This lasted for approx or more then resolved.  She has had several episodes in the past where she had the numbness and cold sensation on the Rt side of her face that would last for a few minutes then resolve.  Never has had an episode to this severity.   Pt states she saw Dr Ronal Fear NP for her routine appt last week.  She described the above to her, and a carotid doppler was ordered and done this am.  She has another appt scheduld with Dr Gerilyn Pilgrim for f/u.  Also was prescribed Plavix and she is taking this daily as prescribed.  Pt also states she is very allergic to poison ivy, etc.  She did get a small spot of poison ivy a couple weeks ago by her Lt eyebrow.  This resolved, but since she has been itching all over, and breaks out in various locations  with red bumps that form tiny blisters.  She states she cannot have steroids.  Also doesnt tolerate Benadryl.  Has not tried any other antihistamines.  States when she has gone to ER in the past has been given shots and IV's but is uncertain of what, other than possibly Zantac.  Allergies: 1)  ! Steroids 2)  ! Ultram 3)  ! Codeine 4)  ! * Willo 5)  ! Bayer Aspirin (Aspirin)  Past History:  Past medical history reviewed for relevance to current acute and chronic problems.  Past Medical History: Reviewed history from 12/14/2008 and no changes required. Hepatitis C diagnosed in 1993 needs hepatic panel every 6 months, treated for 1 years Hyperlipidemia  since 2001 Hearing loss since age 89 hypertension  since 2001 sleep apnea  non compliant with machine, dx in 2001 Tinnitus which is disabling 2006 Fracture left foot and ankle  2006  i mmobilized for healing diverticulosis diagnosed in2008 H/O panic disorder, was followed by mental health  Review of Systems General:  Denies chills, fatigue, and fever. ENT:  Denies decreased hearing, earache, nasal congestion, and sinus pressure. CV:  Denies chest pain or discomfort and palpitations. Resp:  Denies shortness of breath. MS:  Denies muscle weakness. Derm:  Complains of itching and rash. Neuro:  Denies headaches, numbness, tingling, and visual disturbances.  Physical Exam  General:  Well-developed,well-nourished,in no acute distress; alert,appropriate and cooperative throughout examination Head:  Normocephalic and atraumatic without obvious abnormalities. No apparent alopecia or balding. Eyes:  pupils equal, pupils round, and pupils reactive to light.   Ears:  External ear exam shows no significant lesions or deformities.  Otoscopic examination reveals clear canals, tympanic membranes are intact bilaterally without bulging, retraction, inflammation or discharge. Hearing is grossly normal bilaterally. Nose:  External nasal examination  shows no deformity or inflammation. Nasal mucosa are pink and moist without lesions or exudates. Mouth:  Oral mucosa and oropharynx without lesions or exudates.  Neck:  No deformities, masses, or tenderness noted.no thyromegaly and no carotid bruits.   Lungs:  Normal respiratory effort, chest expands symmetrically. Lungs are clear to auscultation, no crackles or wheezes. Heart:  Normal rate and regular rhythm. S1 and S2 normal without gallop, murmur, click, rub or other extra sounds. Extremities:  No clubbing, cyanosis, edema, or deformity noted with normal full range of motion of all joints.   Neurologic:  alert & oriented X3, cranial nerves II-XII intact, strength normal in all extremities, sensation intact to light touch, gait normal, and DTRs symmetrical and normal.   Skin:  excoriated grouped red papules abd noted. Cervical Nodes:  No lymphadenopathy noted Psych:  memory intact for recent and remote, normally interactive, good eye contact, and not anxious appearing.     Impression & Recommendations:  Problem # 1:  ESSENTIAL HYPERTENSION (ICD-401.9) Assessment Comment Only  Her updated medication list for this problem includes:    Metoprolol Succinate 50 Mg Xr24h-tab (Metoprolol succinate) .Marland Kitchen... Take one tablet by mouth daily  Orders: T-Comprehensive Metabolic Panel ([id number])  BP today: 122/60 Prior BP: 120/73 (03/18/2010)  Prior 10 Yr Risk Heart Disease: 9 % (02/27/2010)  Labs Reviewed: K+: 4.8 (10/19/2009) Creat: : 0.63 (10/19/2009)   Chol: 160 (10/19/2009)   HDL: 53 (10/19/2009)   LDL: 87 (10/19/2009)   TG: 99 (10/19/2009)  Problem # 2:  TIA (ICD-435.9) Assessment: New Question of recent CVA.  Work up initiated by Dr Ronal Fear office.   Discussed with pt that if she has an episode like this again to call 911.  That she needs to be seen at ER ASAP.  Discussed with pt that if she is having a stroke that early intervention is important for survival and to reduce the  injury/deficit that can be caused.   Her updated medication list for this problem includes:    Plavix 75 Mg Tabs (Clopidogrel bisulfate) .Marland Kitchen... Take 1 daily  Problem # 3:  DERMATITIS (ICD-692.9) Assessment: New Due to pts inablity to tolerate meds this is difficult to treat.  She agrees to try an over the counter antihistamine such as Loratadine, and also wants to try over the counter Zantac.  Pt declines prescription cream "they never work."  If no improv will need derm referral.  Complete Medication List: 1)  Simvastatin 40 Mg Tabs (Simvastatin) .... Take 1 tablet by mouth once a day 2)  Metoprolol Succinate 50 Mg Xr24h-tab (Metoprolol succinate) .... Take one tablet by mouth daily 3)  Hydrocodone-acetaminophen 5-500 Mg Tabs (Hydrocodone-acetaminophen) .... One tab by mouth every four to six hours as needed pain 4)  Epipen 2-pak 0.3 Mg/0.53ml Devi (Epinephrine) .... 0.15mg  im x 1 dose for an acuteseverer allergic reaction 5)  Advair Diskus 250-50 Mcg/dose  Aepb (Fluticasone-salmeterol) .... Use  as needed 6)  Plavix 75 Mg Tabs (Clopidogrel bisulfate) .... Take 1 daily 7)  Fentanyl 12 Mcg/hr Pt72 (Fentanyl) .... Apply 1 patch every 3 days & remove the old patch  Other Orders: T-Lipid Profile [id number]) T-CBC No Diff ([id number]) T-TSH ([id number])  Patient Instructions: 1)  Please schedule a follow-up appointment in 2 months with Dr Lodema Hong. 2)  Await your Carotid ultrasound results. 3)  I have refilled your Simvastatin for your cholesterol. 4)  Have lab work done fasting this week please. Prescriptions: SIMVASTATIN 40 MG TABS (SIMVASTATIN) Take 1 tablet by mouth once a day  #30 Each x 2   Entered and Authorized by:   Esperanza Sheets PA   Signed by:   Esperanza Sheets PA on 06/24/2010   Method used:   Electronically to        [facility], [facility] (retail)       [address]       [place], [place]  [address]       Ph: [phone]       Fax: [phone]   RxID:    [id number]   Appended Document: OV  06-24-10 Carotid Doppler Impression: "Bilateral carotid atherosclerosis with greater plaque volume on the   left.  No significant carotid stenosis identified.  Estimated   bilateral ICA stenoses are less than 50%."

## 2010-12-10 NOTE — Progress Notes (Signed)
Summary: [facility]   Imported By: Lind Guest 11/27/2009 13:30:43  _____________________________________________________________________  External Attachment:    Type:   Image     Comment:   External Document

## 2010-12-10 NOTE — Letter (Signed)
Summary: Appointment - Reminder 2  [facility] [place]. [address], [place] [address]   Phone: [phone]  Fax: [phone]     October 17, 2010 MRN: [id number]   [facility] [address] [place], [place]  [address]   Dear Ms. Allcorn,  Our records indicate that it is time to schedule a follow-up appointment.  [name]        recommended that you follow up with [place] in    09/2010 PAST DUE        . It is very important that we reach you to schedule this appointment. We look forward to participating in your health care needs. Please contact us at the number listed above at your earliest convenience to schedule your appointment.  If you are unable to make an appointment at this time, give [place] a call so we can update our records.     Sincerely,   Glass blower/designer

## 2010-12-10 NOTE — Letter (Signed)
Summary: History form  History form   Imported By: Jacklynn Ganong 04/18/2010 15:04:53  _____________________________________________________________________  External Attachment:    Type:   Image     Comment:   External Document

## 2010-12-10 NOTE — Miscellaneous (Signed)
Summary: LABS BMP,LIPIDS,LIVER 10/19/09  Clinical Lists Changes  Observations: Added new observation of CALCIUM: 10.0 mg/dL (19/14/7829 56:21) Added new observation of ALBUMIN: 4.7 g/dL (30/86/5784 69:62) Added new observation of PROTEIN, TOT: 7.2 g/dL (95/28/4132 44:01) Added new observation of SGPT (ALT): 11 units/L (10/19/2009 11:56) Added new observation of SGOT (AST): 15 units/L (10/19/2009 11:56) Added new observation of ALK PHOS: 65 units/L (10/19/2009 11:56) Added new observation of BILI DIRECT: 0.1 mg/dL (02/72/5366 44:03) Added new observation of CREATININE: 0.63 mg/dL (47/42/5956 38:75) Added new observation of BUN: 12 mg/dL (64/33/2951 88:41) Added new observation of BG RANDOM: 93 mg/dL (66/04/3015 01:09) Added new observation of CO2 PLSM/SER: 25 meq/L (10/19/2009 11:56) Added new observation of CL SERUM: 104 meq/L (10/19/2009 11:56) Added new observation of K SERUM: 4.8 meq/L (10/19/2009 11:56) Added new observation of NA: 141 meq/L (10/19/2009 11:56) Added new observation of LDL: 87 mg/dL (32/35/5732 20:25) Added new observation of HDL: 53 mg/dL (42/70/6237 62:83) Added new observation of TRIGLYC TOT: 99 mg/dL (15/17/6160 73:71) Added new observation of CHOLESTEROL: 160 mg/dL (05/05/9484 46:27)

## 2010-12-10 NOTE — Assessment & Plan Note (Signed)
Summary: ROV F'U TREADMILL   Visit Type:  Follow-up Primary Provider:  Dr.Margaret Lodema Hong  CC:  right side numbness on friday.  History of Present Illness: Ms. Meding is a 60 y/o CF with multiple compalints with known history of palpatations, tobacco abuse, chronic pain syndrome and arthritis with fx of R ankle.  She was last seen by [name] for complaints of palpations and was placed on Metoprolol and bystolic and atenolol was discontinued. A holter monitor was placed in Feb 2011.  A myoview was completed in April of 2011. All were found to be normal, with hypertensive response to exercise (meds held for stress).  She states that since the medication change her palpatations symptoms have gotten much better.  She complains today of postional dizzienss and instability of her Right leg causing her to severely limit her activities. These symptoms have been ongoing for several months.  She is on chronic pain medication, antianxiety medications, sleeping medication and anti-muscle spasm meds.  She is frustrated with the chronic pain and wants to eliminate some of her medications.  She believes the metoprolol may be contributing her her symptoms.  Preventive Screening-Counseling & Management  Alcohol-Tobacco     Smoking Status: current     Smoking Cessation Counseling: yes     Smoke Cessation Stage: precontemplative  Current Medications (verified): 1)  Simvastatin 40 Mg Tabs (Simvastatin) .... Take 1 Tablet By Mouth Once A Day 2)  Metoprolol Succinate 50 Mg Xr24h-Tab (Metoprolol Succinate) .... Take One Tablet By Mouth Daily 3)  Halcion 0.25 Mg Tabs (Triazolam) .... Take 1 Tab By Mouth At Bedtime 4)  Hydrocodone-Acetaminophen 5-500 Mg Tabs (Hydrocodone-Acetaminophen) .... One Tab By Mouth Every Four To Six Hours As Needed Pain 5)  Zetia 10 Mg Tabs (Ezetimibe) .... Take 1 Tab By Mouth At Bedtime 6)  Epipen 2-Pak 0.3 Mg/0.66ml Devi (Epinephrine) .... 0.15mg  Im X 1 Dose For An Acuteseverer  Allergic Reaction 7)  Polysporin 500-10000 Unit/gm Oint (Bacitracin-Polymyxin B) .... Apply Thin Strip To Each Eye Bid 8)  Flexeril 10 Mg Tabs (Cyclobenzaprine Hcl) .... One Tab Q 8 Hrs As Needed 9)  Advair Diskus 250-50 Mcg/dose Aepb (Fluticasone-Salmeterol) .... Use  As Needed  Allergies (verified): 1)  ! Steroids 2)  ! Ultram 3)  ! Codeine 4)  ! * Willo 5)  ! Bayer Aspirin (Aspirin) PMH-FH-SH reviewed-no changes except otherwise noted  Review of Systems       Postural dizziness.  Chronic pain of her Right leg, ankle with instability.  All other systems have been reviewed and are negative unless stated above.   Vital Signs:  Patient profile:   60 year old female Menstrual status:  hysterectomy Weight:      132 pounds Pulse rate:   91 / minute Pulse (ortho):   91 / minute BP sitting:   131 / 75  (right arm) BP standing:   120 / 73  Vitals Entered By: Dreama Saa, CNA (Mar 18, 2010 10:12 AM)  Serial Vital Signs/Assessments:  Time      Position  BP       Pulse  Resp  Temp     By 10:28 AM  Lying RA  120/70   90                    Sandy Neugent, CNA 10:28 AM  Sitting   118/77   101  Dreama Saa, CNA 10:28 AM  Standing  120/73   [address] CNA   Physical Exam  General:  Well developed, well nourished, in no acute distress. Lungs:  Clear bilaterally to auscultation and percussion. Heart:  Non-displaced PMI, chest non-tender; regular rate and rhythm, S1, S2 without murmurs, rubs or gallops. Carotid upstroke normal, no bruit. Normal abdominal aortic size, no bruits. Femorals normal pulses, no bruits. Pedals normal pulses. No edema, no varicosities. Abdomen:  Bowel sounds positive; abdomen soft and non-tender without masses, organomegaly, or hernias noted. No hepatosplenomegaly. Extremities:  Swelling of the right ankle with pain during ROM. Psych:  anxious and agitated.     EKG  Procedure date:  03/18/2010  Findings:       Normal sinus rhythm with rate of:82bpm  Non-specific ST-T wave changes noted.    Impression & Recommendations:  Problem # 1:  PALPITATIONS (ICD-785.1) These have improved substaintially per patient.  No changes in her medications at this time. Her updated medication list for this problem includes:    Metoprolol Succinate 50 Mg Xr24h-tab (Metoprolol succinate) .Marland Kitchen... Take one tablet by mouth daily  Problem # 2:  HYPERTENSION, HX OF (ICD-V12.50) She is advised to slowly change postions to avoid dizziness.  I do not think the metoprolol is contributing to this and is necessary for BP control.  She is on multple medications for pain, anxiety, and muscle spasms which can also be contributing to these symtpoms.  Orthostatics were completed in the office and found not to be significant.  Of note, she did lose her step when she was called into the exam room from the waiting area.  She was asleep when she was called and stood up with her R ankle not supporting her well.  She confirms this on discussion.  She did not lose conciousness.  Patient Instructions: 1)  Your physician recommends that you schedule a follow-up appointment in: 6 months with [name] 2)  Your physician recommends that you continue on your current medications as directed. Please refer to the Current Medication list given to you today.

## 2010-12-10 NOTE — Assessment & Plan Note (Signed)
Summary: office visit   Vital Signs:  Patient profile:   60 year old female Menstrual status:  hysterectomy Height:      63 inches Weight:      132 pounds BMI:     23.47 O2 Sat:      98 % Pulse rate:   82 / minute Pulse rhythm:   regular Resp:     16 per minute BP sitting:   118 / 90  (left arm) Cuff size:   regular  Vitals Entered By: Everitt Amber LPN (July 22, 2010 9:22 AM) CC: she is brusing and bleeding alot if she scratches herself since taking the plavix. Has some moles on her skin that are changing and she wants them checked. Thinks she had a stroke on aug 8th.    Primary Care Provider:  Dr.Margaret Lodema Hong  CC:  she is brusing and bleeding alot if she scratches herself since taking the plavix. Has some moles on her skin that are changing and she wants them checked. Thinks she had a stroke on aug 8th. .  History of Present Illness: Pt presents with several concerns. She is currently being evaluated by neurology for a possible cVA, and describes expressive aphasia.She is still smoking and trying to quit, but finds this very difficult. She is cncerned about hercholesterol increasingoff of zetia , and requests a PA be sent in for her, in light of vascular disease I thinkl this appropriate, she describes a low fat diet. she denioes depression or anxiety and her sleep is controlled on current meds. She does have concerns about skin lesions on her forearm and back as well an ingrown toenail that ashe wants addressed. She has noted some bruising with plavix    Preventive Screening-Counseling & Management  Alcohol-Tobacco     Smoking Cessation Counseling: yes  Current Medications (verified): 1)  Simvastatin 40 Mg Tabs (Simvastatin) .... Take 1 Tablet By Mouth Once A Day 2)  Metoprolol Succinate 50 Mg Xr24h-Tab (Metoprolol Succinate) .... Take One Tablet By Mouth Daily 3)  Hydrocodone-Acetaminophen 5-500 Mg Tabs (Hydrocodone-Acetaminophen) .... One Tab By Mouth Every Four To  Six Hours As Needed Pain 4)  Epipen 2-Pak 0.3 Mg/0.82ml Devi (Epinephrine) .... 0.15mg  Im X 1 Dose For An Acuteseverer Allergic Reaction 5)  Advair Diskus 250-50 Mcg/dose Aepb (Fluticasone-Salmeterol) .... Use  As Needed 6)  Plavix 75 Mg Tabs (Clopidogrel Bisulfate) .... Take 1 Daily 7)  Fentanyl 12 Mcg/hr Pt72 (Fentanyl) .... Apply 1 Patch Every 3 Days & Remove The Old Patch  Allergies (verified): 1)  ! Steroids 2)  ! Ultram 3)  ! Codeine 4)  ! * Willo 5)  ! Bayer Aspirin (Aspirin)  Review of Systems      See HPI General:  Complains of fatigue; feels her elvated lipids contribute to her fatigue. Eyes:  Denies discharge, eye pain, and red eye. ENT:  Denies hoarseness, nasal congestion, sinus pressure, and sore throat. CV:  Denies chest pain or discomfort, palpitations, and swelling of feet. Resp:  Complains of cough; denies sputum productive; chronic smoker's cough. GI:  Denies constipation, diarrhea, nausea, and vomiting. GU:  Denies dysuria and urinary frequency. MS:  Complains of joint pain, low back pain, and mid back pain. Derm:  Complains of lesion(s); hyperpigmented lesions x 2 on left thigh over the yrs, recurring , getting lgr, also lesion on the back for exam , right ingrown toenail. Neuro:  Complains of headaches; reports recentdevelopment of expressive aphasia. Endo:  Denies cold intolerance, excessive thirst,  excessive urination, and heat intolerance. Heme:  Complains of abnormal bruising; denies bleeding. Allergy:  Complains of seasonal allergies.  Physical Exam  General:  Well-developed,well-nourished,in no acute distress; alert,appropriate and cooperative throughout examination HEENT: No facial asymmetry,  EOMI, No sinus tenderness, TM's Clear, oropharynx  pink and moist.   Chest: Clear to auscultation bilaterally.  CVS: S1, S2, No murmurs, No S3.   Abd: Soft, Nontender.  MS: Adequate ROM spine, hips, shoulders and knees.  Ext: No edema.   CNS: CN 2-12 intact,  power tone and sensation normal throughout.   Skin: Intact, hyperpigmented macular lesions on  left forearm x 2 and also white lesion on mid back. Ingrown right great toenail Psych: Good eye contact, normal affect.  Memory intact, not anxious or depressed appearing.    Impression & Recommendations:  Problem # 1:  INGROWN TOENAIL (ICD-703.0) Assessment Comment Only  Orders: Podiatry Referral (Podiatry)  Problem # 2:  DERMATITIS (ICD-692.9) Assessment: Comment Only  Orders: Dermatology Referral (Derma)  Problem # 3:  IMPAIRED FASTING GLUCOSE (ICD-790.21) Assessment: Comment Only  Orders: T- Hemoglobin A1C ([id number]) Pt advised to reduce carbohydrate intake, espescially sweets, and to start regular physical activity, at least 30 minutes 5 days weekly, to enable weight loss, and reduce the risk of becoming diabetic   Problem # 4:  TIA (ICD-435.9) Assessment: Comment Only  Her updated medication list for this problem includes:    Plavix 75 Mg Tabs (Clopidogrel bisulfate) .Marland Kitchen... 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   Complete Medication List: 1)  Simvastatin 40 Mg Tabs (Simvastatin) .... Take 1 tablet by mouth once a day 2)  Metoprolol Succinate 50 Mg Xr24h-tab (Metoprolol succinate) .... Take one tablet by mouth daily 3)  Hydrocodone-acetaminophen 5-500 Mg Tabs (Hydrocodone-acetaminophen) .... One tab by mouth every four to six hours as needed pain 4)  Epipen 2-pak 0.3 Mg/0.36ml Devi (Epinephrine) .... 0.15mg  im x 1 dose for an acuteseverer allergic reaction 5)  Advair Diskus 250-50 Mcg/dose Aepb (Fluticasone-salmeterol) .... Use  as needed 6)  Plavix 75 Mg Tabs (Clopidogrel bisulfate) .... Take 1 daily 7)  Fentanyl 12 Mcg/hr Pt72 (Fentanyl) .... Apply 1 patch every 3 days & remove the old  patch 8)  Zetia 10 Mg Tabs (Ezetimibe) .... Take 1 tab by mouth at bedtime 9)  Polysporin 500-10000 Unit/gm Oint (Bacitracin-polymyxin b) .... Apply thin strip to each eye two times a day  Other Orders: Influenza Vaccine MCR [id number]) T-Hepatic Function [phone]) T-Lipid Profile ([id number])  Patient Instructions: 1)  Please schedule a follow-up appointment in 4 months. 2)  Tobacco is very bad for your health and your loved ones! You Should stop smoking!. 3)  Stop Smoking Tips: Choose a Quit date. Cut down before the Quit date. decide what you will do as a substitute when you feel the urge to smoke(gum,toothpick,exercise). 4)  Hepatic Panel prior to visit, ICD-9: 5)  Lipid Panel prior to visit, ICD-9: 6)  HbgA1C prior to visit, ICD-9: Prescriptions: POLYSPORIN 500-10000 UNIT/GM OINT (BACITRACIN-POLYMYXIN B) Apply thin strip to each eye two times a day  #3.5gm x 0   Entered by:   Everitt Amber LPN   Authorized by:   Syliva Overman MD   Signed by:   Everitt Amber LPN on 96/29/5284   Method used:   Electronically to        [facility],  [facility] (retail)       [address]       [place], [place]  [address]       Ph: [phone]       Fax: [phone]   RxID:   [id number] ZETIA 10 MG TABS (EZETIMIBE) Take 1 tab by mouth at bedtime  #30 x 3   Entered and Authorized by:   Syliva Overman MD   Signed by:   Syliva Overman MD on 07/22/2010   Method used:   Electronically to        [facility], [facility] (retail)       [address]       [place], [place]  [address]       Ph: [phone]       Fax: [phone]   RxID:   [id number]    Influenza Vaccine    Vaccine Type: Fluvax MCR    Site: right deltoid    Mfr: novartis     Dose: 0.5 ml    Route: IM    Given by: Everitt Amber LPN    Exp. Date: 03/2011    Lot #: [id number]

## 2010-12-10 NOTE — Letter (Signed)
Summary: *Orthopedic Consult Note  [facility]  [address]  [place], [place] [address]   Phone: [phone]  Fax: [phone]    Re:    [name] DOB:    1951-01-08   Dear: Darleen Crocker   Thank you for requesting that we see the above patient for consultation.  A copy of the detailed office note will be sent under separate cover, for your review.  Evaluation today is consistent with: arthritis RIGHT ankle, severe   Our recommendation is for: referred ankle specialist for ankle fusion but patient declined       Thank you for this opportunity to look after your patient.  Sincerely,   Terrance Mass. MD.

## 2010-12-10 NOTE — Procedures (Signed)
Summary: 21 day end of summary  21 day end of summary   Imported By: Faythe Ghee 01/03/2010 09:51:17  _____________________________________________________________________  External Attachment:    Type:   Image     Comment:   External Document  Appended Document: 21 day end of summary No significant arrythmia  All NSR  Appended Document: 21 day end of summary pt aware of results

## 2010-12-10 NOTE — Progress Notes (Signed)
  Phone Note Call from Patient   Caller: [name] Call For: blood pressure issues Summary of Call: patient called stated her insurance wouldn't pay for bystolic so we changed her to toprol xl 50mg  daily now she is dizzy,blurred vision, not feeling well bp is running 159/79 states toprol is not working she has some bystolic at home she stated she could start taking them but told her not to take until the doctor got back with you or [name] about this issue made her a bp check on wed at 9:00 am . phone no is [phone]. Initial call taken by: Dreama Saa, CNA,  December 31, 2009 4:35 PM  Follow-up for Phone Call        pt stated since starting toprol she had had headache, blurred vision, dizziness.  She is to come in tomorrow for a nurse visit has only been on toprol for 4 days Follow-up by: Teressa Lower RN,  January 01, 2010 10:22 AM

## 2010-12-10 NOTE — Assessment & Plan Note (Signed)
Summary: OV   Vital Signs:  Patient profile:   60 year old female Menstrual status:  hysterectomy Height:      63 inches Weight:      126.75 pounds BMI:     22.53 O2 Sat:      93 % Pulse rate:   89 / minute Pulse rhythm:   regular Resp:     16 per minute BP sitting:   120 / 60 Cuff size:   regular  Vitals Entered By: Everitt Amber (November 12, 2009 9:58 AM) CC: Follow up chronic problems, went for allergy testing but they needed a form signed by pcp saying it was ok to test for suspected allergy incase she had bad reaction Is Patient Diabetic? No   CC:  Follow up chronic problems and went for allergy testing but they needed a form signed by pcp saying it was ok to test for suspected allergy incase she had bad reaction.  History of Present Illness: the pt ius in today with 2 major concerns. One is regarding a new onset of palpitatioins. She states she was hospitalised in the past for this and had been on medication which awas stopped by a subsequent provider.. she has noted inc frequency and longer duration of the episodes in the past 4 to 6 months, and at least once medication to stop[ them re-introduced.She ahs a printout of meds she has been on in the past, and atenolol seems the most likely. She is sometimes light headed with the episodes. Shedenies chest pain, pND or rorthopnea. he also has concerns about right sided headaches which have been present but not addressed for months. She also describves tight bands around her upper arms and thighs , worse when she sits. She alludes to a vascular cause, but i told her based on the history it is more likely to be neirpologic, she has upcoming studies. She also has concerns about eval by the allergist which she jut had. She wants to be specifically tested for willoow" which she states is in asprin, an dasks that I sewnd this msg to the ALLERGIST.   Preventive Screening-Counseling & Management  Alcohol-Tobacco     Smoking Status: current     Packs/Day: 1.0  Current Medications (verified): 1)  Simvastatin 40 Mg Tabs (Simvastatin) .... Take 1 Tablet By Mouth Once A Day 2)  Bystolic 10 Mg Tabs (Nebivolol Hcl) .... One Tab By Mouth Qd 3)  Halcion 0.25 Mg Tabs (Triazolam) .... Take 1 Tab By Mouth At Bedtime 4)  Hydrocodone-Acetaminophen 5-500 Mg Tabs (Hydrocodone-Acetaminophen) .... One Tab By Mouth Every Four To Six Hours As Needed Pain 5)  Zetia 10 Mg Tabs (Ezetimibe) .... Take 1 Tab By Mouth At Bedtime 6)  Epipen 2-Pak 0.3 Mg/0.28ml Devi (Epinephrine) .... 0.15mg  Im X 1 Dose For An Acuteseverer Allergic Reaction 7)  Polysporin 500-10000 Unit/gm Oint (Bacitracin-Polymyxin B) .... Apply Thin Strip To Each Eye Bid 8)  Flexeril 10 Mg Tabs (Cyclobenzaprine Hcl) .... One Tab Q 8 Hrs As Needed  Allergies (verified): 1)  ! Steroids 2)  ! Ultram 3)  ! Codeine  Past History:  Risk Factors: Smoking Status: current (11/12/2009) Packs/Day: 1.0 (11/12/2009)  Social History: Packs/Day:  1.0  Review of Systems General:  Denies chills and fever. Eyes:  Denies blurring and discharge. ENT:  Denies hoarseness, nasal congestion, and sinus pressure. CV:  Complains of palpitations; h/o palpitaions saw cardiologist in [place], was on med to regulate her heartbeat which was d/c by previous  provider Dondiego.increasedf frequency of palpitaions with lightheadeness in the past  several months, on avg 4 timesper week lasts about 3 to 5 miins hard cough stops this, no nausea, no chest pain. Resp:  Complains of cough; denies sputum productive; chronic smoker's cough. GI:  Denies abdominal pain, constipation, diarrhea, nausea, and vomiting. GU:  Denies dysuria and urinary frequency. MS:  Complains of joint pain and stiffness; right ankle stifness and instability  fusion was recommended, but states she was refused because of smoking. aLAO EXPERIENCES TIGHT "BANDS" OD PAIN IN UPPER ARM AND THIGHS. Neuro:  Complains of headaches; still having left  headache decribes a tight band fromarm to hand both sides also tight band behind thuighs and calves. Psych:  Complains of anxiety; denies depression. Endo:  Denies cold intolerance, excessive hunger, excessive thirst, excessive urination, heat intolerance, polyuria, and weight change. Heme:  Denies abnormal bruising and bleeding. Allergy:  Denies hives or rash and sneezing; pt has a h/o near anaphylactic reactions , she thinks her allergy is to willow , andwants to be tested specifically for this, as she states this is in aspirin, and she needs to know, she has seen the allergist and wants correspondence sent requesting this specific testing.  Physical Exam  General:  Well-developed,well-nourished,in no acute distress; alert,appropriate and cooperative throughout examination. aNXIOUS. HEENT: No facial asymmetry,  EOMI, No sinus tenderness, TM's Clear, oropharynx  pink and moist.   Chest: Clear to auscultation bilaterally.  CVS: S1, S2, No murmurs, No S3.   Abd: Soft, Nontender.  MS: Adequate ROM spine, hips, shoulders and knees.decreased power in right grip with posisitive Tinnel's  Ext: No edema.   CNS: CN 2-12 intact, power tone and sensation normal throughout.   Skin: Intact, no visible lesions or rashes.  Psych: Good eye contact, normal affect.  Memory intact, not anxious or depressed appearing.    Impression & Recommendations:  Problem # 1:  PALPITATIONS (ICD-785.1) Assessment Deteriorated  Her updated medication list for this problem includes:    Bystolic 10 Mg Tabs (Nebivolol hcl) ..... One tab by mouth qd    Atenolol 25 Mg Tabs (Atenolol) .Marland Kitchen... Take 1 tablet by mouth once a day  Orders: Cardiology Referral (Cardiology)  Problem # 2:  HEADACHE (ICD-784.0) Assessment: Deteriorated  Her updated medication list for this problem includes:    Bystolic 10 Mg Tabs (Nebivolol hcl) ..... One tab by mouth qd    Hydrocodone-acetaminophen 5-500 Mg Tabs (Hydrocodone-acetaminophen)  ..... One tab by mouth every four to six hours as needed pain    Atenolol 25 Mg Tabs (Atenolol) .Marland Kitchen... 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   Complete Medication List: 1)  Simvastatin 40 Mg Tabs (Simvastatin) .... Take 1 tablet by mouth once a day 2)  Bystolic 10 Mg Tabs (Nebivolol hcl) .... One tab by mouth qd 3)  Halcion 0.25 Mg Tabs (Triazolam) .... Take 1 tab by mouth at bedtime 4)  Hydrocodone-acetaminophen 5-500 Mg Tabs (Hydrocodone-acetaminophen) .... One tab by mouth every four to six hours as needed pain 5)  Zetia 10 Mg Tabs (Ezetimibe) .... Take 1 tab by mouth at bedtime 6)  Epipen 2-pak 0.3 Mg/0.58ml Devi (Epinephrine) .... 0.15mg  im x 1 dose for an acuteseverer allergic reaction 7)  Polysporin 500-10000 Unit/gm Oint (Bacitracin-polymyxin b) .... Apply thin strip to each eye bid 8)  Flexeril 10 Mg Tabs (Cyclobenzaprine hcl) .... One tab q 8 hrs as needed 9)  Atenolol 25 Mg Tabs (Atenolol) .... Take 1 tablet by mouth once a day  Patient Instructions: 1)  F/u as before. 2)  I will refer you to a cardiologist about you plpitations . 3)  I will speak directly with Dr Sonny Masters about you questions regarding your test, headache and pain sensations in upper and lower ext. 4)  I will speak with allergist requesting specific testing for willow Prescriptions: ATENOLOL 25 MG TABS (ATENOLOL) Take 1 tablet by mouth once a day  #30 x 4   Entered and Authorized by:   Syliva Overman MD    Signed by:   Syliva Overman MD on 11/12/2009   Method used:   Electronically to        [facility], [facility] (retail)       [address]       [place], [place]  [address]       Ph: [phone]       Fax: [phone]   RxID:   [id number]

## 2010-12-10 NOTE — Letter (Signed)
Summary: [facility] (Nuc Med Stress)   [facility]  [address] [place] [address]   Phone: [phone]  Fax: [phone]    Nuclear Medicine 1-Day Stress Test Information Sheet  Re:     [name]   DOB:     [birth year] MRN:     [id number] Weight:  Appointment Date: Register at: Appointment Time: Referring MD:  ___Exercise Stress  __Adenosine   __Dobutamine  _x_Lexiscan  __Persantine   __Thallium  Urgency: ____1 (next day)   ____2 (one week)    ____3 (PRN)  Patient will receive Follow Up call with results: Patient needs follow-up appointment:  Instructions regarding medication:  How to prepare for your stress test: 1. DO NOT eat or dring 6 hours prior to your arrival time. This includes no caffeine (coffee, tea, sodas, chocolate) if you were instructed to take your medications, drink water with it. 2. DO NOT use any tobacco products for at leaset 8 hours prior to arrival. 3. DO NOT wear dresses or any clothing that may have metal clasps or buttons. 4. Wear short sleeve shirts, loose clothing, and comfortalbe walking shoes. 5. DO NOT use lotions, oils or powder on your chest before the test. 6. The test will take approximately 3-4 hours from the time you arrive until completion. 7. To register the day of the test, go to the Short Stay entrance at [facility]. 8. If you must cancel your test, call [phone] as soon as you are aware.  After you arrive for test:   When you arrive at [facility], you will go to Short Stay to be registered. They will then send you to Radiology to check in. The Nuclear Medicine Tech will get you and start an IV in your arm or hand. A small amount of a radioactive tracer will then be injected into your IV. This tracer will then have to circulate for 30-45 minutes. During this time you will wait in the waiting room and you will be able to drink something without caffeine. A series of pictures will be taken  of your heart follwoing this waiting period. After the 1st set of pictures you will go to the stress lab to get ready for your stress test. During the stress test, another small amount of a radioactive tracer will be injected through your IV. When the stress test is complete, there is a short rest period while your heart rate and blood pressure will be monitored. When this monitoring period is complete you will have another set of pictrues taken. (The same as the 1st set of pictures). These pictures are taken between 15 minutes and 1 hour after the stress test. The time depends on the type of stress test you had. Your doctor will inform you of your test results within 7 days after test.    The possibilities of certain changes are possible during the test. They include abnormal blood pressure and disorders of the heart. Side effects of persantine or adenosine can include flushing, chest pain, shortness of breath, stomach tightness, headache and light-headedness. These side effects usually do not last long and are self-resolving. Every effort will be made to keep you comfortable and to minimize complications by obtaining a medical history and by close observation during the test. Emergency equipment, medications, and trained personnel are available to deal with any unusual situation which may arise.  Please notify office at least 48 hours in advance if you are unable to keep  this appt.

## 2010-12-10 NOTE — Assessment & Plan Note (Signed)
Summary: office visit   Vital Signs:  Patient profile:   60 year old female Menstrual status:  hysterectomy Height:      63 inches Weight:      129.50 pounds BMI:     23.02 O2 Sat:      97 % on Room air Pulse rate:   97 / minute Resp:     16 per minute BP sitting:   110 / 60  (left arm)  Vitals Entered By: Adella Hare LPN (October 11, 2010 9:46 AM)  O2 Flow:  Room air CC: follow-up visit Is Patient Diabetic? No   Primary Care Provider:  Dr.Margaret Lodema Hong  CC:  follow-up visit.  History of Present Illness: Pt states she contacted the scooter store who asked more mobility questions than she has been asked by any of her doctors. In summary she reports being severly limited in her ADL's due to safety issues, and belives that the need for a pWC isjustified based on he rhealth conditions. She reports difficulty and feeling unsafe getting in and out of bathsetc. prt is requesting PWC, reports recurrent falls, 3 times this month due to, loss of balance. She reports limited upper body movement, worse in the past 1 month, states can't sleep at night due to pain in the right ankle. She has broken the left ankle several yrs ago dr Hilda Lias managed this. Uses lift chair at home, also has multiple other pieces of equipment inc electric wheelchair which all reportedly have  been donated. Reports poor mobility esp in the past 1 month.,esp due to right ankle , Orthopedics  recently saw pt about this. Also c/o right hip pain. Has handicap ramp and states she will put concrete in her yard to improve safety by flattening thesurface.  C/o neck pain, bilateral shoulder bursitis.  Preventive Screening-Counseling & Management  Alcohol-Tobacco     Smoking Cessation Counseling: yes  Current Medications (verified): 1)  Simvastatin 40 Mg Tabs (Simvastatin) .... Take 1 Tablet By Mouth Once A Day 2)  Metoprolol Succinate 50 Mg Xr24h-Tab (Metoprolol Succinate) .... Take One Tablet By Mouth  Daily 3)  Hydrocodone-Acetaminophen 5-500 Mg Tabs (Hydrocodone-Acetaminophen) .... One Tab By Mouth Every Four To Six Hours As Needed Pain 4)  Epipen 2-Pak 0.3 Mg/0.69ml Devi (Epinephrine) .... 0.15mg  Im X 1 Dose For An Acuteseverer Allergic Reaction 5)  Advair Diskus 250-50 Mcg/dose Aepb (Fluticasone-Salmeterol) .... Use  As Needed 6)  Plavix 75 Mg Tabs (Clopidogrel Bisulfate) .... Take 1 Daily 7)  Fentanyl 12 Mcg/hr Pt72 (Fentanyl) .... Apply 1 Patch Every 3 Days & Remove The Old Patch 8)  Zetia 10 Mg Tabs (Ezetimibe) .... Take 1 Tab By Mouth At Bedtime 9)  Cetirizine Hcl 10 Mg Tabs (Cetirizine Hcl) .... Take 1 Daily As Needed For Hives  Allergies (verified): 1)  ! Steroids 2)  ! Ultram 3)  ! Codeine 4)  ! * Willo 5)  ! Bayer Aspirin (Aspirin) 6)  ! Flu Vaccine  Review of Systems      See HPI General:  Complains of fatigue, malaise, and weakness. Eyes:  Denies eye pain and red eye. ENT:  Denies hoarseness, nasal congestion, and sinus pressure. CV:  Denies chest pain or discomfort, palpitations, and swelling of feet. Resp:  Complains of cough; denies sputum productive and wheezing; chronic nicotine use. GI:  Denies abdominal pain, constipation, diarrhea, nausea, and vomiting. GU:  Denies dysuria and urinary frequency. MS:  Complains of joint pain, low back pain, mid back pain, and  stiffness; hurt right (bad) ankle this past month. Derm:  Denies lesion(s) and rash. Neuro:  Complains of difficulty with concentration, disturbances in coordination, poor balance, and weakness; denies seizures and sensation of room spinning. Psych:  Complains of anxiety, depression, easily tearful, irritability, and mental problems; denies suicidal thoughts/plans, thoughts of violence, and unusual visions or sounds. Endo:  Denies cold intolerance, excessive hunger, excessive thirst, and excessive urination. Heme:  Denies abnormal bruising and bleeding. Allergy:  Denies hives or rash and itching  eyes.  Physical Exam  General:  Well-developed,well-nourished,in no acute distress; alert,pt has multiple problems and concerns, and exhibits pressure of speech with fluctuation in her mood, she is primarily angry or depressed. HEENT: No facial asymmetry,  EOMI, No sinus tenderness, TM's Clear, oropharynx  pink and moist.   Chest: decresed air entry  CVS: S1, S2, No murmurs, No S3.   Abd: Soft, Nontender.  MS: decreased  ROM spine, hips, shoulders and knees. Wide based gait  Ext: No edema.   CNS: CN 2-12 intact, power tone and sensation normal throughout.   Skin: Intact, no visible lesions or rashes.  Psych: Good eye contact,   Memory intact, not anxious  appearing.    Impression & Recommendations:  Problem # 1:  ACCIDENTAL FALLS, RECURRENT (ICD-E888.9) Assessment Deteriorated  Orders: Physical Therapy Referral (PT) pt to be evaluated for power wheelchair necessity  Problem # 2:  INGROWN TOENAIL (ICD-703.0) Assessment: Comment Only recent cal from podiatrist on 10/17/2010. pt seems to havemade several calls c/o work recently done for this prob, the doc was quewtioning the "type of patient" she was based on the problem. He had been offering multiple appts to evaluate the area and she always was unable to make it seemingly.  Problem # 3:  ARTHRITIS, RIGHT ANKLE (ICD-716.97) Assessment: Deteriorated reports multiple falls  Problem # 4:  INSOMNIA UNSPECIFIED (ICD-780.52) Assessment: Deteriorated pt tearful talking about her blind duck which is dying and extremely concerned about arsenic poisoning  Problem # 5:  NICOTINE ADDICTION (ICD-305.1) Assessment: Unchanged  Encouraged smoking cessation and discussed different methods for smoking cessation.   Complete Medication List: 1)  Simvastatin 40 Mg Tabs (Simvastatin) .... Take 1 tablet by mouth once a day 2)  Metoprolol Succinate 50 Mg Xr24h-tab (Metoprolol succinate) .... Take one tablet by mouth daily 3)   Hydrocodone-acetaminophen 5-500 Mg Tabs (Hydrocodone-acetaminophen) .... One tab by mouth every four to six hours as needed pain 4)  Epipen 2-pak 0.3 Mg/0.70ml Devi (Epinephrine) .... 0.15mg  im x 1 dose for an acuteseverer allergic reaction 5)  Advair Diskus 250-50 Mcg/dose Aepb (Fluticasone-salmeterol) .... Use  as needed 6)  Fentanyl 12 Mcg/hr Pt72 (Fentanyl) .... Apply 1 patch every 3 days & remove the old patch 7)  Zetia 10 Mg Tabs (Ezetimibe) .... Take 1 tab by mouth at bedtime 8)  Cetirizine Hcl 10 Mg Tabs (Cetirizine hcl) .... Take 1 daily as needed for hives 9)  Plavix 75 Mg Tabs (Clopidogrel bisulfate) .... One half daily per patient  Other Orders: T-Hepatic Function [phone]) T-Lipid Profile [phone]) T- Hemoglobin A1C ([id number])  Patient Instructions: 1)  Please schedule a follow-up appointment in 4 months. 2)  Tobacco is very bad for your health and your loved ones! You Should stop smoking!. 3)  Stop Smoking Tips: Choose a Quit date. Cut down before the Quit date. decide what you will do as a substitute when you feel the urge to smoke(gum,toothpick,exercise). 4)  you are being referred for physical therapy eval 5)  your recent labs are excellent , continue current meds   Orders Added: 1)  Est. Patient Level IV [[address]3235] 2)  T-Hepatic Function [[id number]] 3)  T-Lipid Profile [[id number]] 4)  T- Hemoglobin A1C [[id number]] 5)  Physical Therapy Referral [PT]

## 2010-12-10 NOTE — Letter (Signed)
Summary: DERMATOLOGY  DERMATOLOGY   Imported By: Lind Guest 08/21/2010 13:29:38  _____________________________________________________________________  External Attachment:    Type:   Image     Comment:   External Document

## 2010-12-10 NOTE — Letter (Signed)
[facility]   Imported By: Lind Guest 10/18/2010 10:51:48  _____________________________________________________________________  External Attachment:    Type:   Image     Comment:   External Document

## 2010-12-10 NOTE — Letter (Signed)
Summary: Appointment - Reminder 2  [facility]. [address]   [place], [place] [address]   Phone: [phone]  Fax: [phone]     October 18, 2010 MRN: [id number]   [facility] [address] [place], [place]  [address]   Dear Ms. Shingler,  Our records indicate that it is time to schedule a follow-up appointment.  Dr.    Eden Emms      recommended that you follow up with [place] in    11.2011 PAST DUE        . It is very important that we reach you to schedule this appointment. We look forward to participating in your health care needs. Please contact us at the number listed above at your earliest convenience to schedule your appointment.  If you are unable to make an appointment at this time, give [place] a call so we can update our records.     Sincerely,   Glass blower/designer

## 2010-12-10 NOTE — Letter (Signed)
Summary: [facility]  [facility]   Imported By: Lind Guest 08/22/2010 14:58:11  _____________________________________________________________________  External Attachment:    Type:   Image     Comment:   External Document

## 2010-12-10 NOTE — Assessment & Plan Note (Signed)
Summary: [name]- room 2   Vital Signs:  Patient profile:   60 year old female Menstrual status:  hysterectomy Height:      63 inches Weight:      131.75 pounds BMI:     23.42 O2 Sat:      97 % on Room air Pulse rate:   91 / minute Resp:     16 per minute BP sitting:   122 / 60  (left arm)  Vitals Entered By: Adella Hare LPN (July 31, 2010 9:26 AM) CC: [name] Is Patient Diabetic? No Pain Assessment Patient in pain? no        Referring Provider:  Dr. Gerilyn Pilgrim Primary Provider:  Dr.Margaret Lodema Hong  CC:  [name].  History of Present Illness: Pt reports [name] and itching x 1 week.  This seems to correlate with when she started Zetia, though she has taken this in the past without problems.  Pt denies any other new meds, soaps, foods,etc. No nasal congestion or sneezing.  No difficulty breathing, wheezing, or difficulty swallowing.  No throat or tongue swelling. Has not tried any over the counter meds for releif of syptoms. Pt admits that she has a hx of anxiety and panic d/o. she wonders if the [name] may be related to this, though she has not been having any panic attacks.  She has been feeling increased stress because her computer is not currently working.   Current Medications (verified): 1)  Simvastatin 40 Mg Tabs (Simvastatin) .... Take 1 Tablet By Mouth Once A Day 2)  Metoprolol Succinate 50 Mg Xr24h-Tab (Metoprolol Succinate) .... Take One Tablet By Mouth Daily 3)  Hydrocodone-Acetaminophen 5-500 Mg Tabs (Hydrocodone-Acetaminophen) .... One Tab By Mouth Every Four To Six Hours As Needed Pain 4)  Epipen 2-Pak 0.3 Mg/0.41ml Devi (Epinephrine) .... 0.15mg  Im X 1 Dose For An Acuteseverer Allergic Reaction 5)  Advair Diskus 250-50 Mcg/dose Aepb (Fluticasone-Salmeterol) .... Use  As Needed 6)  Plavix 75 Mg Tabs (Clopidogrel Bisulfate) .... Take 1 Daily 7)  Fentanyl 12 Mcg/hr Pt72 (Fentanyl) .... Apply 1 Patch Every 3 Days & Remove The Old Patch 8)  Zetia 10 Mg Tabs  (Ezetimibe) .... Take 1 Tab By Mouth At Bedtime 9)  Polysporin 500-10000 Unit/gm Oint (Bacitracin-Polymyxin B) .... Apply Thin Strip To Each Eye Two Times A Day  Allergies (verified): 1)  ! Steroids 2)  ! Ultram 3)  ! Codeine 4)  ! * Willo 5)  ! Bayer Aspirin (Aspirin)  Past History:  Past medical history reviewed for relevance to current acute and chronic problems.  Past Medical History: Reviewed history from 12/14/2008 and no changes required. Hepatitis C diagnosed in 1993 needs hepatic panel every 6 months, treated for 1 years Hyperlipidemia  since 2001 Hearing loss since age 63 hypertension  since 2001 sleep apnea  non compliant with machine, dx in 2001 Tinnitus which is disabling 2006 Fracture left foot and ankle  2006  i mmobilized for healing diverticulosis diagnosed in2008 H/O panic disorder, was followed by mental health  Review of Systems General:  Denies chills and fever.  Physical Exam  General:  Well-developed,well-nourished,in no acute distress; alert,appropriate and cooperative throughout examination Head:  Normocephalic and atraumatic without obvious abnormalities. No apparent alopecia or balding. Ears:  External ear exam shows no significant lesions or deformities.  Otoscopic examination reveals clear canals, tympanic membranes are intact bilaterally without bulging, retraction, inflammation or discharge. Hearing is grossly normal bilaterally. Nose:  External nasal examination shows no deformity or inflammation. Nasal  mucosa are pink and moist without lesions or exudates. Mouth:  Oral mucosa and oropharynx without lesions or exudates.   Neck:  No deformities, masses, or tenderness noted. Lungs:  Normal respiratory effort, chest expands symmetrically. Lungs are clear to auscultation, no crackles or wheezes. Heart:  Normal rate and regular rhythm. S1 and S2 normal without gallop, murmur, click, rub or other extra sounds. Skin:  erythematous welt noted bilat  palms, Rt 5th finger Cervical Nodes:  No lymphadenopathy noted Psych:  Cognition and judgment appear intact. Alert and cooperative with normal attention span and concentration. No apparent delusions, illusions, hallucinations   Impression & Recommendations:  Problem # 1:  URTICARIA (ICD-708.9) Assessment New  Problem # 2:  ANXIETY STATE, UNSPECIFIED (ICD-300.00) Assessment: Comment Only  Discussed medication use and relaxation techniques.   Problem # 3:  HYPERLIPIDEMIA (ICD-272.4) Assessment: Comment Only Advised pt to hold Zetia at this time.  To discuss chol treatment with Dr Lodema Hong at her f/u appt.  Her updated medication list for this problem includes:    Simvastatin 40 Mg Tabs (Simvastatin) .Marland Kitchen... Take 1 tablet by mouth once a day    Zetia 10 Mg Tabs (Ezetimibe) .Marland Kitchen... Take 1 tab by mouth at bedtime  Complete Medication List: 1)  Simvastatin 40 Mg Tabs (Simvastatin) .... Take 1 tablet by mouth once a day 2)  Metoprolol Succinate 50 Mg Xr24h-tab (Metoprolol succinate) .... Take one tablet by mouth daily 3)  Hydrocodone-acetaminophen 5-500 Mg Tabs (Hydrocodone-acetaminophen) .... One tab by mouth every four to six hours as needed pain 4)  Epipen 2-pak 0.3 Mg/0.13ml Devi (Epinephrine) .... 0.15mg  im x 1 dose for an acuteseverer allergic reaction 5)  Advair Diskus 250-50 Mcg/dose Aepb (Fluticasone-salmeterol) .... Use  as needed 6)  Plavix 75 Mg Tabs (Clopidogrel bisulfate) .... Take 1 daily 7)  Fentanyl 12 Mcg/hr Pt72 (Fentanyl) .... Apply 1 patch every 3 days & remove the old patch 8)  Zetia 10 Mg Tabs (Ezetimibe) .... Take 1 tab by mouth at bedtime 9)  Polysporin 500-10000 Unit/gm Oint (Bacitracin-polymyxin b) .... Apply thin strip to each eye two times a day 10)  Cetirizine Hcl 10 Mg Tabs (Cetirizine hcl) .... Take 1 daily as needed for [name]  Patient Instructions: 1)  Keep your next scheduled appt with Dr Lodema Hong in October.  Return sooner if your symptoms persist. 2)  I have  prescribed an allergry medication for you to help with the [name] and itching. 3)  Discontinue Zetia at this time. Prescriptions: CETIRIZINE HCL 10 MG TABS (CETIRIZINE HCL) take 1 daily as needed for [name]  #30 x 0   Entered and Authorized by:   Esperanza Sheets PA   Signed by:   Esperanza Sheets PA on 07/31/2010   Method used:   Electronically to        [facility] (retail)       [address]       [place], [place]  [address]       Ph: [phone]       Fax: [phone]   RxID:   [id number]

## 2010-12-10 NOTE — Progress Notes (Signed)
Summary: Appt. Question  Phone Note Call from Patient   Caller: Patient Summary of Call: office needs to reschedule pt's appt for 4/29/pt wants to know if it necessary for her to come/pt was only coming for f/u treadmill/pls call pt at either one of the phone numbers listed/preferrably the [phone]/tg Initial call taken by: Raechel Ache [facility],  March 06, 2010 10:28 AM  Follow-up for Phone Call        Appt. rescheduled for May 9 with Gladis Riffle, NP.  Follow-up by: Larita Fife Via LPN,  March 06, 2010 11:20 AM

## 2010-12-10 NOTE — Progress Notes (Signed)
Summary: Naval architect from Patient   Summary of Call: Marisue Ivan from [employer] called wanted to know if doctor would call to approve of scootter for patient please call Marisue Ivan back @ [phone] Initial call taken by: Eugenio Hoes,  October 14, 2010 5:04 PM  Follow-up for Phone Call        are we doing this through them or [place]? Follow-up by: Adella Hare LPN,  October 16, 2010 11:19 AM  Additional Follow-up for Phone Call Additional follow up Details #1::        shwe wants [facility], I am awaiting pT eval Additional Follow-up by: Syliva Overman MD,  October 16, 2010 2:45 PM    Additional Follow-up for Phone Call Additional follow up Details #2::    noted Follow-up by: Adella Hare LPN,  October 17, 2010 8:40 AM

## 2010-12-12 NOTE — Progress Notes (Signed)
Summary: CALL BACK  Phone Note Call from Patient   Summary of Call: RETURNING YOUR CALL Initial call taken by: Lind Guest,  November 26, 2010 3:34 PM  Follow-up for Phone Call        called patient back regarding previous message and there was still no answer.   See previous message  Follow-up by: Everitt Amber LPN,  November 26, 2010 4:50 PM

## 2010-12-12 NOTE — Letter (Signed)
Summary: [name]  [name]   Imported By: Lind Guest 10/24/2010 08:14:15  _____________________________________________________________________  External Attachment:    Type:   Image     Comment:   External Document

## 2010-12-12 NOTE — Progress Notes (Signed)
Summary: physical therapy  Phone Note Call from Patient   Summary of Call: patient called in and was requesting information about physical therapy.  She can go on Monday, Wednesday or Friday morning.  Her number is [phone]. Initial call taken by: Lind Guest,  November 18, 2010 1:33 PM  Follow-up for Phone Call        Called physical therapy they stated they needed paperwork refaxed.  I refaxed to them and they will call patient with appt.  I also called Mrs. Gail asking her to call our office back so I could tell her they should contact her. Follow-up by: Rudene Anda,  November 18, 2010 3:09 PM  Additional Follow-up for Phone Call Additional follow up Details #1::        pt stated she has already done wheel chair eval. But stated that dr. Lodema Hong wanted her to have it done again cause it was not done right the first time.. per pt. please advise i don't see another referral and rept is in black box. Additional Follow-up by: Rudene Anda,  November 18, 2010 4:29 PM    Additional Follow-up for Phone Call Additional follow up Details #2::    I had called and spoken directly with Arline Asp the chief therapis about her, asking for review, since pt was very  upset with originasl ebval which does not qualify her. PLS call and leave a msg to call me back, when she calls back pls get me to the phone, this matyter should have been completely resolved one way or another by now Follow-up by: Syliva Overman MD,  November 18, 2010 5:55 PM  Additional Follow-up for Phone Call Additional follow up Details #3:: Details for Additional Follow-up Action Taken: I left a msg for Arline Asp to call you at [phone] when she calls I will get you. Additional Follow-up by: Curtis Sites,  November 19, 2010 8:15 AM  Note from [place] reviewed, she states  clearly that the only thing that can be offered fron the PT dept is therapy to improve gait stability. Not able to state she needs a PWC form the eval. She also recommends  if at ll possible pt gets concrete poured in a path to her animals so she will be betterable to access them safely outside to feed them, pls let me know ifshe is willing to opt for the therapy, based on her last visit I do not expect this will be so.    called patient, left message 11/26/2010 at 3pm Called patient, no answer 11/28/2010 CANNOT REACH PATIENT. MAILED LETTER

## 2010-12-12 NOTE — Letter (Signed)
[facility]   Imported By: Lind Guest 10/24/2010 08:15:01  _____________________________________________________________________  External Attachment:    Type:   Image     Comment:   External Document

## 2010-12-12 NOTE — Letter (Signed)
Summary: assessment for a wheelchair  assessment for a wheelchair   Imported By: Lind Guest 11/20/2010 10:40:41  _____________________________________________________________________  External Attachment:    Type:   Image     Comment:   External Document

## 2010-12-12 NOTE — Letter (Signed)
Summary: EMAIL  EMAIL   Imported By: Lind Guest 11/21/2010 08:11:27  _____________________________________________________________________  External Attachment:    Type:   Image     Comment:   External Document

## 2010-12-12 NOTE — Letter (Signed)
Summary: Letter  Letter   Imported By: Lind Guest 12/02/2010 16:25:16  _____________________________________________________________________  External Attachment:    Type:   Image     Comment:   External Document

## 2010-12-12 NOTE — Assessment & Plan Note (Signed)
Summary: ov   Vital Signs:  Patient profile:   60 year old female Menstrual status:  hysterectomy Height:      63 inches Weight:      129.25 pounds Pulse rate:   73 / minute Pulse rhythm:   regular Resp:     16 per minute BP sitting:   100 / 58  (left arm)  Vitals Entered By: Adella Hare LPN (October 30, 2010 9:55 AM) CC: wants to discuss some things Is Patient Diabetic? No Comments did not bring meds to ov   Primary Care Kassadi Presswood:  Dr.Margaret Lodema Hong  CC:  wants to discuss some things.  History of Present Illness: Pt states she is not happy about the toenail, and she is going to another [facility]  in [place].  She is also distressed about her wheelchair decision, states her foot swells and she is unable to move around safely, states she has unsteady gait, both shoulders hurt, knees  hurt. states her mobility in her home is  extremely limitedl  The pt is interested in re-eval by PT states she cant stand to dress, has to sit down to put on pants socks and shoes, has to grab the wall on standing, has fallen in her home, loses her balance on turning, if pivots and gets pain which is too intense , she is off balance on the floor, the rght anlkle is the worst,C/O right grt toenail being damage dbecause of how she walks and this is turn causes back pain, lleft elbow, right wrist , rindex have all been broken and has bursitis in shoulders, also has neck probs  Allergies (verified): 1)  ! Steroids 2)  ! Ultram 3)  ! Codeine 4)  ! * Willo 5)  ! Bayer Aspirin (Aspirin) 6)  ! Flu Vaccine  Review of Systems      See HPI General:  Complains of fatigue. Psych:  Complains of anxiety, depression, and mental problems. Heme:  Denies abnormal bruising and bleeding. Allergy:  Denies hives or rash and itching eyes.  Physical Exam  General:  adequately l-nourished,in no acute cardiopulmoary distress; alert,mentally and emotionally distraught HEENT: No facial asymmetry,  EOMI, No  sinus tenderness, decreased rOM of neck  Chest: Clear to auscultation bilaterally. Decreased air entry CVS: S1, S2, No murmurs, No S3.   Abd: Soft, Nontender.  MS: decreased  ROM spine, hips, shoulders and knees. decreased rom anlke Ext: No edema.   CNS: CN 2-12 intact,r tone and sensation normal throughout. upper and lower ext weakness with abnormal gait  Skin: Intact, no visible lesions or rashes.  Psych: Good eye contact, normal affect.  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 Take 1 tablet by mouth once a day 2)  Metoprolol Succinate 50 Mg Xr24h-tab (Metoprolol succinate) .... Take one tablet by mouth daily 3)  Hydrocodone-acetaminophen 5-500 Mg Tabs (Hydrocodone-acetaminophen) .... One tab by mouth every four to six hours as needed pain 4)  Epipen 2-pak 0.3 Mg/0.38ml Devi (Epinephrine) .... 0.15mg  im x 1 dose for an acuteseverer allergic reaction 5)  Advair Diskus 250-50 Mcg/dose Aepb (Fluticasone-salmeterol) .... Use  as needed 6)  Fentanyl 12 Mcg/hr Pt72 (Fentanyl) .... Apply 1 patch every 3 days & remove the old patch 7)  Zetia 10 Mg Tabs (Ezetimibe) .... Take 1 tab by mouth at bedtime 8)  Cetirizine Hcl 10 Mg Tabs (Cetirizine hcl) .... Take 1 daily as needed for hives 9)  Plavix 75 Mg Tabs (Clopidogrel bisulfate) .... One half daily per patient  Patient Instructions: 1)  f/u as before  2)  I will call   you as soon as I hear from the physical therapist. 3)  pls  kerep appt with new podiatrist in [place]   Orders Added: 1)  Est. Patient Level III [04540]

## 2010-12-13 NOTE — Letter (Signed)
Summary: [facility]   Imported By: Lind Guest 07/09/2010 09:03:32  _____________________________________________________________________  External Attachment:    Type:   Image     Comment:   External Document

## 2011-01-16 ENCOUNTER — Telehealth: Payer: Self-pay | Admitting: Family Medicine

## 2011-01-27 ENCOUNTER — Ambulatory Visit (INDEPENDENT_AMBULATORY_CARE_PROVIDER_SITE_OTHER): Payer: Medicare Other | Admitting: Family Medicine

## 2011-01-27 ENCOUNTER — Encounter: Payer: Self-pay | Admitting: Family Medicine

## 2011-01-27 DIAGNOSIS — R5383 Other fatigue: Secondary | ICD-10-CM

## 2011-01-27 DIAGNOSIS — M79609 Pain in unspecified limb: Secondary | ICD-10-CM

## 2011-01-27 DIAGNOSIS — R222 Localized swelling, mass and lump, trunk: Secondary | ICD-10-CM | POA: Insufficient documentation

## 2011-01-28 ENCOUNTER — Encounter: Payer: Self-pay | Admitting: Family Medicine

## 2011-01-28 NOTE — Progress Notes (Signed)
Summary: lump on chest / letter  Phone Note Call from Patient   Summary of Call: patient has  a lump on her chest   not a breast lump does not have an appt. till 4.2.12 offered 2 other appts, and she has other appoinments she made wants you to call her and also about the letter you sent her. Initial call taken by: Lind Guest,  January 16, 2011 8:20 AM  Follow-up for Phone Call        called patient left message  Follow-up by: Everitt Amber LPN,  January 20, 2011 12:59 PM  Additional Follow-up for Phone Call Additional follow up Details #1::        Has a small bump on her left side under breast in between 2 ribs. Started out a purplish/yellow color like a fading bruise. Color went away and then she noticed a small bump. Started out the size of a baby pea, now its the size of a bigger pea. Does she need to come in earlier than April 2 Additional Follow-up by: Everitt Amber LPN,  January 20, 2011 2:44 PM    Additional Follow-up for Phone Call Additional follow up Details #2::    pls call the pt the letter has your signature so pls follow up on this. Withouit seeing the lump I would suggest the earliest appt is most appropriate Follow-up by: Syliva Overman MD,  January 20, 2011 3:59 PM  Additional Follow-up for Phone Call Additional follow up Details #3:: Details for Additional Follow-up Action Taken: patient aware and scheduling earlier appt left message 3.14.12   x 2 patient called back and scheduled for 3.19.12 @ 10:00 Luann Bullins 3.15.12 Additional Follow-up by: Adella Hare LPN,  January 21, 2011 4:18 PM

## 2011-01-29 ENCOUNTER — Encounter: Payer: Self-pay | Admitting: Family Medicine

## 2011-01-29 ENCOUNTER — Other Ambulatory Visit: Payer: Self-pay | Admitting: Family Medicine

## 2011-01-29 DIAGNOSIS — R222 Localized swelling, mass and lump, trunk: Secondary | ICD-10-CM

## 2011-02-03 ENCOUNTER — Other Ambulatory Visit: Payer: Self-pay | Admitting: Family Medicine

## 2011-02-04 ENCOUNTER — Telehealth: Payer: Self-pay | Admitting: Family Medicine

## 2011-02-06 NOTE — Miscellaneous (Signed)
  Clinical Lists Changes  Orders: Added new Referral order of Radiology Referral (Radiology) - Signed 

## 2011-02-06 NOTE — Telephone Encounter (Signed)
Spoke with patient she is aware of the appoinment

## 2011-02-06 NOTE — Assessment & Plan Note (Signed)
Summary: office visit per doc   Vital Signs:  Patient profile:   60 year old female Menstrual status:  hysterectomy Height:      63 inches Weight:      126.50 pounds BMI:     22.49 O2 Sat:      99 % Pulse rate:   72 / minute Pulse rhythm:   regular Resp:     16 per minute BP sitting:   120 / 66  (right arm)  Vitals Entered By: Everitt Amber LPN (January 27, 2011 9:49 AM) CC: Has a lump under her left breast that she wants checked. Has been there a few weeks, the size of a pea   Primary Care Provider:  Dr.Margaret Lodema Hong  CC:  Has a lump under her left breast that she wants checked. Has been there a few weeks and the size of a pea.  History of Present Illness: Reports  that she is generally doing well. She does have 2 mmain concerns necessitating an early visit. Denies recent fever or chills. Denies sinus pressure, nasal congestion , ear pain or sore throat. Denies chest congestion, or cough productive of sputum. Denies chest pain, palpitations, PND, orthopnea or leg swelling. Denies abdominal pain, nausea, vomitting, diarrhea or constipation. Denies change in bowel movements or bloody stool. Denies dysuria , frequency, incontinence or hesitancy.  Denies headaches, vertigo, seizures. Denies depression, anxiety or insomnia. Denies  rash, lesions, or itch.     Preventive Screening-Counseling & Management  Alcohol-Tobacco     Smoking Cessation Counseling: yes  Current Medications (verified): 1)  Simvastatin 40 Mg Tabs (Simvastatin) .... Take 1 Tablet By Mouth Once A Day 2)  Metoprolol Succinate 50 Mg Xr24h-Tab (Metoprolol Succinate) .... Take One Tablet By Mouth Daily 3)  Lortab 7.5-500 Mg Tabs (Hydrocodone-Acetaminophen) .... Take 1 Tablet By Mouth Four Times A Day 4)  Epipen 2-Pak 0.3 Mg/0.33ml Devi (Epinephrine) .... 0.15mg  Im X 1 Dose For An Acuteseverer Allergic Reaction 5)  Advair Diskus 250-50 Mcg/dose Aepb (Fluticasone-Salmeterol) .... Use  As Needed 6)  Fentanyl 12  Mcg/hr Pt72 (Fentanyl) .... Apply 1 Patch Every 3 Days & Remove The Old Patch 7)  Zetia 10 Mg Tabs (Ezetimibe) .... Take 1 Tab By Mouth At Bedtime 8)  Cetirizine Hcl 10 Mg Tabs (Cetirizine Hcl) .... Take 1 Daily As Needed For Hives 9)  Plavix 75 Mg Tabs (Clopidogrel Bisulfate) .... One Half Daily Per Patient  Allergies (verified): 1)  ! Steroids 2)  ! Ultram 3)  ! Codeine 4)  ! * Willo 5)  ! Bayer Aspirin (Aspirin) 6)  ! Flu Vaccine  Past History:  Past Medical History: Hepatitis C diagnosed in 1993 needs hepatic panel every 6 months, treated for 1 year Hyperlipidemia  since 2001 Hearing loss since age 24 hypertension  since 2001 sleep apnea  non compliant with machine, dx in 2001 Tinnitus which is disabling 2006 Fracture left foot and ankle  2006  i mmobilized for healing diverticulosis diagnosed in2008 H/O panic disorder, was followed by mental health  Review of Systems      See HPI MS:  left 4th toe and 2nd right toe pain in ther ojint waking pt  approx 6 to 8 months wakes up pt  every night, walks for 30 mins,very well, pain is as much as a 10 wants to know the reason , wonders if it is gout. Derm:  Complains of changes in nail beds and lesion(s); painless "lump" on lefty ant chest, lower rib cage  x 2 weeks, mobile, feels as though there may be a 2nd lump. h/o bruise in the area, but no recall of trauma still concerned  that toenails still ingrown. Endo:  Denies cold intolerance, excessive hunger, excessive thirst, and excessive urination. Heme:  Denies abnormal bruising, bleeding, enlarge lymph nodes, and fevers.  Physical Exam  General:  Well-developed,well-nourished,in no acute distress; alert,appropriate and cooperative throughout examination HEENT: No facial asymmetry,  EOMI, No sinus tenderness, TM's Clear, oropharynx  pink and moist.   Chest: Clear to auscultation bilaterally.  CVS: S1, S2, No murmurs, No S3.   Abd: Soft, Nontender.  MS: Adequate ROM spine, hips,  shoulders and knees.  Ext: No edema.   CNS: CN 2-12 intact, power tone and sensation normal throughout.   Skin: Intact, no visible lesions or rashes. Palpalble c/st or nodule on left lower chest anteriorly, stated areas of concern Psych: Good eye contact, normal affect.  Memory intact, not anxious or depressed appearing.    Impression & Recommendations:  Problem # 1:  TOE PAIN (ICD-729.5) Assessment Deteriorated  Orders: Rheumatology Referral (Rheumatology) T-Uric Acid (Blood) ([address]-60454)  Problem # 2:  SWELLING, MASS, OR LUMP IN CHEST (ICD-786.6) Assessment: Comment Only  Orders: Radiology Referral (Radiology), i believe this is a benign lesion and have told pt this  Problem # 3:  ANXIETY STATE, UNSPECIFIED (ICD-300.00) Assessment: Improved  Problem # 4:  HYPERLIPIDEMIA (ICD-272.4) Assessment: Comment Only  Her updated medication list for this problem includes:    Simvastatin 40 Mg Tabs (Simvastatin) .Marland Kitchen... Take 1 tablet by mouth once a day    Zetia 10 Mg Tabs (Ezetimibe) .Marland Kitchen... Take 1 tab by mouth at bedtime Low fat dietdiscussed and encouraged  Labs Reviewed: SGOT: 18 (10/01/2010)   SGPT: 13 (10/01/2010)  Prior 10 Yr Risk Heart Disease: 9 % (02/27/2010)   HDL:48 (10/01/2010), 46 (07/08/2010)  LDL:93 (10/01/2010), 108 (09/81/1914)  Chol:156 (10/01/2010), 184 (07/08/2010)  Trig:73 (10/01/2010), 151 (07/08/2010)  Problem # 5:  PALPITATIONS (ICD-785.1) Assessment: Improved  Her updated medication list for this problem includes:    Metoprolol Succinate 50 Mg Xr24h-tab (Metoprolol succinate) .Marland Kitchen... Take one tablet by mouth daily  Avoid caffeine, chocolate, and stimulants. Stress reduction as well as medication options discussed.   Complete Medication List: 1)  Simvastatin 40 Mg Tabs (Simvastatin) .... Take 1 tablet by mouth once a day 2)  Metoprolol Succinate 50 Mg Xr24h-tab (Metoprolol succinate) .... Take one tablet by mouth daily 3)  Lortab 7.5-500 Mg Tabs  (Hydrocodone-acetaminophen) .... Take 1 tablet by mouth four times a day 4)  Epipen 2-pak 0.3 Mg/0.3ml Devi (Epinephrine) .... 0.15mg  im x 1 dose for an acuteseverer allergic reaction 5)  Advair Diskus 250-50 Mcg/dose Aepb (Fluticasone-salmeterol) .... Use  as needed 6)  Fentanyl 12 Mcg/hr Pt72 (Fentanyl) .... Apply 1 patch every 3 days & remove the old patch 7)  Zetia 10 Mg Tabs (Ezetimibe) .... Take 1 tab by mouth at bedtime 8)  Cetirizine Hcl 10 Mg Tabs (Cetirizine hcl) .... Take 1 daily as needed for hives 9)  Plavix 75 Mg Tabs (Clopidogrel bisulfate) .... One half daily per patient  Patient Instructions: 1)  f/u as before. 2)  Tobacco is very bad for your health and your loved ones! You Should stop smoking!. 3)  Stop Smoking Tips: Choose a Quit date. Cut down before the Quit date. decide what you will do as a substitute when you feel the urge to smoke(gum,toothpick,exercise). 4)  You are referred to rheumatology for evaluation for toe  pain. 5)  I believe you have cysts/nodules on your left chest , I will order an appropiate study to confirm this. 6)  Uric acid level to be added to labs. 7)  We need to send for your mamogram You are referr  Orders Added: 1)  Rheumatology Referral [Rheumatology] 2)  Est. Patient Level IV [04540] 3)  T-Uric Acid (Blood) [[id number]] 4)  Radiology Referral [Radiology]

## 2011-02-07 ENCOUNTER — Ambulatory Visit (HOSPITAL_COMMUNITY): Payer: Medicare Other

## 2011-02-10 ENCOUNTER — Ambulatory Visit: Payer: Self-pay | Admitting: Family Medicine

## 2011-02-20 LAB — BASIC METABOLIC PANEL
Chloride: 102 mEq/L (ref 96–112)
GFR calc Af Amer: >60 mL/min (ref >60)
GFR calc non Af Amer: >60 mL/min (ref >60)
Potassium: 4.6 mEq/L (ref 3.5–5.1)
Sodium: 138 mEq/L (ref 135–145)

## 2011-02-20 LAB — CBC
HCT: 40 % (ref 36.0–46.0)
Hemoglobin: 14 g/dL (ref 12.0–15.0)
MCV: 90.6 fL (ref 78.0–100.0)
RBC: 4.42 MIL/uL (ref 3.87–5.11)
WBC: 9.3 10*3/uL (ref 4.0–10.5)

## 2011-02-26 ENCOUNTER — Telehealth: Payer: Self-pay | Admitting: Family Medicine

## 2011-02-26 DIAGNOSIS — R5383 Other fatigue: Secondary | ICD-10-CM

## 2011-02-27 ENCOUNTER — Other Ambulatory Visit: Payer: Self-pay

## 2011-02-27 DIAGNOSIS — R5383 Other fatigue: Secondary | ICD-10-CM

## 2011-02-27 NOTE — Telephone Encounter (Signed)
Patient aware her labs were fine.  Wanted you to know that she went to [facility] and her BP was low--98/50 and they told her to contact our office regarding that as well as other symptoms she is having. She stays tired all the time and could sleep all day. Also having headaches and also has a few tick bites. She did go without her plavix for a few days but she is back on it. [place] did start her on Vit D 50000 units weekly. She wants to know what you think her problem is and if she needs more labs?

## 2011-02-27 NOTE — Telephone Encounter (Signed)
Ordered and patient aware and schedule appt

## 2011-02-27 NOTE — Telephone Encounter (Signed)
pls let her know her cholesterol ,liver panel and HBA1C were tested inNovember and these were all normal at the time.

## 2011-02-27 NOTE — Telephone Encounter (Signed)
pls order cbc and diff , chem 7 and tsh for fatigue , she needs to sched ov to be seen when i am available

## 2011-03-07 ENCOUNTER — Other Ambulatory Visit: Payer: Self-pay | Admitting: *Deleted

## 2011-03-07 MED ORDER — METOPROLOL SUCCINATE ER 50 MG PO TB24: 50.0000 mg | ORAL_TABLET | Freq: Every day | ORAL | Status: DC

## 2011-03-10 ENCOUNTER — Other Ambulatory Visit (INDEPENDENT_AMBULATORY_CARE_PROVIDER_SITE_OTHER): Payer: Medicare Other

## 2011-03-10 DIAGNOSIS — E785 Hyperlipidemia, unspecified: Secondary | ICD-10-CM

## 2011-03-10 MED ORDER — SIMVASTATIN 40 MG PO TABS: 40.0000 mg | ORAL_TABLET | Freq: Every day | ORAL | Status: DC

## 2011-03-11 ENCOUNTER — Telehealth: Payer: Self-pay | Admitting: Family Medicine

## 2011-03-11 NOTE — Telephone Encounter (Signed)
Social services states patient is on medicaid and gets transportation through them, they cannot provide transportation this far unless it is medically necessary.  Patient comes here by choice, it is not medically necessary. Social services wants a letter faxed to them stating this, is this ok?

## 2011-03-12 ENCOUNTER — Telehealth: Payer: Self-pay | Admitting: Family Medicine

## 2011-03-12 NOTE — Telephone Encounter (Signed)
Forwarded so you will be on the look out for form from [employer]

## 2011-03-12 NOTE — Telephone Encounter (Signed)
And wants to let you know that she still feels bad feels tired

## 2011-03-13 ENCOUNTER — Telehealth: Payer: Self-pay | Admitting: Family Medicine

## 2011-03-25 NOTE — Op Note (Signed)
NAMECHARNAE, LILL NO.:  [id number]   MEDICAL RECORD NO.:  [id number]          PATIENT TYPE:  AMB   LOCATION:  DAY                           FACILITY:  [facility]   PHYSICIAN:  Tilford Pillar, MD      DATE OF BIRTH:  02/03/1951   DATE OF PROCEDURE:  01/31/2009  DATE OF DISCHARGE:                               OPERATIVE REPORT   PREOPERATIVE DIAGNOSIS:  Incisional hernia.   POSTOPERATIVE DIAGNOSIS:  Incisional hernia.   PROCEDURE:  Open anterior repair of a incisional hernia with a 5 x 10 cm  Proceed mesh.   SURGEON:  Tilford Pillar, MD   ANESTHESIA:  General endotracheal, local anesthetic 0.5% Sensorcaine  plain.   SPECIMENS:  None.   ESTIMATED BLOOD LOSS:  Scant.   INDICATIONS:  The patient is a 60 year old female, well known to me  after previous sigmoid colectomy for diverticular disease who presents  with a bulge in her __________.  The patient's initial recovery from her  prior operations did include earlier return to work than I would have  liked.  The patient did admit to having increased pain during this  period.  On evaluation in the office, the patient does have a  bulge in  her __________.    Dictation ended at this point.      Tilford Pillar, MD  Electronically Signed     BZ/MEDQ  D:  02/01/2009  T:  02/01/2009  Job:  [id number]

## 2011-03-25 NOTE — Op Note (Signed)
[name], [name] NO.:  [id number]   MEDICAL RECORD NO.:  [id number]          PATIENT TYPE:  INP   LOCATION:  A323                          FACILITY:  [facility]   PHYSICIAN:  Tilford Pillar, MD      DATE OF BIRTH:  1950-11-26   DATE OF PROCEDURE:  08/07/2008  DATE OF DISCHARGE:                               OPERATIVE REPORT   PREOPERATIVE DIAGNOSIS:  Recurrent sigmoid diverticulitis.   POSTOPERATIVE DIAGNOSIS:  Recurrent sigmoid diverticulitis.   PROCEDURES:  1. Laparoscopic sigmoid colectomy with mobilization of the splenic      flexure.  2. Rigid proctoscopy.   SURGEON:  Tilford Pillar, MD   ANESTHESIA:  General endotracheal, local anesthetic 1% lidocaine plain.   SPECIMEN:  Sigmoid colon and anastomotic doughnuts x2.   ESTIMATED BLOOD LOSS:  Less than 100 mL.   INDICATIONS:  The patient is a 60 year old female who presented to my  office on referral after multiple episodes of left lower quadrant  abdominal pain.  She had been worked up extensively and diagnosed with  multiple episodes of sigmoid diverticulitis, last episode being  approximately 6 weeks ago.  The risks, benefits, and alternatives of a  laparoscopic possible open colectomy were discussed at length with the  patient including, but not limited to risk of bleeding, infection,  anastomotic leak, the possible need for an end colostomy with interval  anastomoses as well as the possibility of intraoperative cardiac and  pulmonary events.  The patient's questions and concerns were addressed.  The patient was consented for the planned procedure.   OPERATION:  The patient was taken to the operating room, placed in a  supine position on the operating room table at which time general  anesthetic was administered.  Once the patient was asleep, she was  endotracheally intubated by anesthesia.  At this point, she had a Foley  catheter placed in sterile fashion by the operating room staff and then  was  placed in bilateral stirrups in a lithotomy position.  Her perineum  was prepped with Betadine solution.  Her abdomen was prepped with  DuraPrep solution and drapes were placed in standard fashion.  A stab  incision was created supraumbilically at previous umbilical trocar site  scar with an 11-blade scalpel.  Additional dissection down through the  subcuticular tissue was carried out using a Kocher clamp, which we  utilized to grasp the anterior abdominal fascia, which was grasped  anteriorly.  A Veress needle was inserted.  Saline drop test was  utilized to confirm intraperitoneal placement and then a 11-mm trocar  was inserted over the laparoscope allowing visualization of the trocar  entering into the peritoneal cavity.  At this point, the inner cannula  was removed.  The laparoscope was reinserted.  There was no evidence of  any trocar or Veress needle placement injury.  Then attention was turned  to placement of a right lower quadrant trocar.  Stab incision was  created with an 11-blade scalpel.  An 11-mm trocar was inserted under  direct visualization of the laparoscope.  An additional 11-mm trocar was  placed into the left upper quadrant again through a stab incision and  placement of a trocar under direct visualization.  The initial umbilical  trocar was removed, and the incision was elongated in a left lateral  keyhole incision around the umbilicus and carried down inferiorly along  the midline.  The incision was created long enough to easily incorporate  my hand.  Additional dissection down to the subcuticular tissue was  carried out using electrocautery.  Upon entrance into the peritoneal  cavity, the pneumoperitoneum was wrapped and decompressed.  Upon  entrance, the fascial defect was elongated both superiorly and  inferiorly.  Once the size was adequate easily, I placed my hand snuggly  through the abdominal wall.  I then placed a hand port into the defect  and reinsufflated  the abdomen.  At this point, electrocautery was  utilized to dissect several pelvic adhesions along the midline diffuse  adhesions freed.  I turned my attention to the left lower quadrant.  On  palpating this area, the sigmoid colon could easily be identified and  was clearly adherent to the left lateral abdominal wall with multiple  inflammatory adhesions in this area.  A combination of sharp endocurette  dissection, blunt dissection, and electrocautery dissection were  utilized to dissect the sigmoid colon free from the lateral abdominal  wall.  This was carried up proximally along the left lateral peritoneal  reflection up to the splenic flexure, which was mobilized for planned  additional length for pelvic anastomoses.  At this time, I returned my  attention back to the left lower quadrant and the inflammatory  adhesions.  With continued careful dissection, I was able to mobilize  the sigmoid colon in its entirety.  The rectum was also freed from the  adhesions to the vaginal cuff inferiorly.  Again with sharp endocurette  dissection with the sigmoid colon and rectum freed and dissection well  away from the ureter, which was identified and noted to be clear of the  area of the dissection.  I then turned my attention to the rectosigmoid  junction in the mesenteric pedicle, the window created in the site.  I  then utilized a reticulating 45 Endo-GIA regular staple load to divide  the rectum at the rectosigmoid junction.  This required an additional  reload for free division of the rectum with an excellent staple line.  I  then continued to free the vascular pedicle proximally with laparoscopic  ligature continuing to dissect the sigmoid mesocolon.  The sigmoid colon  was freed, I carried this proximal to the __________ area of the colon,  so I did palpate the circle via descending colon, diverticulum  __________  the majority of polyps were within the planned resected  specimen and  having nut mobilization of the descending colon easily  reached the pelvis, then pulled the distal portion of colon through the  hand port.  Pneumoperitoneum was decompressed at this point in with the  sigmoid colon and distal descending colon outside  of the abdomen, I  cleared an area of planned proximal division.  Adipose tissue was  cleared over the overlying colon, and I used an automatic purse-string  device to place a purse-string suture on the proximal portion of the  descending colon.  Using heavy Mayo scissors, I divided the bowel distal  __________ and placed this with the three permanent specimens  on the  back table.  At this point, I did open the lumen around the purse-  string,  there was stool which was __________ and then Betadine solution  was poured into the lumen of the distal descending colon.  A 29-mm  __________  EEA stapler was brought to the field and was placed into the  open lumen and purse-string suture was secured.   The distal portion of the descending colon was then reinserted back into  the abdomen.  Pneumoperitoneum was initiated and at this I proceeded  below.  Using digital dilatation of the rectal vault, I did encounter  some impacted stool, which I __________ and then advanced serial  dilators from 27-29 mm EEA sizer to dilate the distal rectum.  A  __________ palpating the __________ rectum and pelvis, I then advanced a  29 EEA stapler without any difficulties __________  staple line of the  rectal stump.  The stapler was deployed and the Envoy was connected in  standard fashion again with RNA, first assistant.  The EEA stapler was  fired followed by __________  removed resulting in excellent staple  anastomoses.  The doughnuts were inspected on the back table, this was  complete and we are sending it as a permanent  specimen with the sigmoid  colon.  At this time, the pelvis was irrigated and the irrigation fluid  was allowed to cool the pelvis  overlying the staple line.  I then  performed a rigid proctoscopy.  I did see the intraluminal portion of  the staple line which was still intact.  Then with insufflation of the  lumen using the rigid proctoscope, there was no noted air leak from the  anastomoses visualized  the laparoscope.  __________ anastomosis  __________  returned back to surgical field.   __________  the anastomoses, at this point I did turn attention to  closure.  The pelvis and skin was irrigated and and the returning fluid  aspirate was clear.  Using an Endoclose suture passing device I passed a  0-Vicryl sutures to both __________  trocar sites, noted that I did  place an additional 11-mm trocar in the right lower quadrant during the  procedure for additional angulation and visualization of the  laparoscope.  __________  trocar sites.  I then turned attention to  closure of the midline hand port.  Hand port was removed.  Again  __________  0 Novofil suture was utilized x2 to reapproximate the fascia  of the hand port.  This was started inferiorly and was brought to the  midportion __________  the second was started superiorly and was brought  to the first and secured to each other and tails were tucked underneath  the subcuticular tissue.  The pneumoperitoneum evacuated.  The 2-0  Vicryl sutures secured at trocar sites.  Local anesthetic was instilled  at all trocar sites and hand port and skin staples were utilized to  reapproximate the skin edges at all trocar sites and at hand port.  Skin  was washed, dried with moist dry towel.  Sterile dressings were placed.  The drapes removed.  The dressings were secured.  The patient was  allowed to come out of general anesthetic and was transferred back to  regular hospital bed in stable condition.  At the conclusion of the  procedure, all instrument, sponge, and needle counts were correct.  The  patient tolerated the procedure well.      Tilford Pillar, MD   Electronically Signed     BZ/MEDQ  D:  08/07/2008  T:  08/08/2008  Job:  [id number]  cc:   Kassie Mends, M.D.  [address]  [place] , [place] [address]

## 2011-03-25 NOTE — Assessment & Plan Note (Signed)
NAME:  [name], [name]                CHART#:  [id number]   DATE:                                   DOB:  September 29, 1951   CHIEF COMPLAINT:  Work-in office visit for severe chest pain and  dysphagia status post Bravo placement.   SUBJECTIVE:  [name] is a 60 year old female, who has history of  refractory GERD including hoarseness, heartburn, regurgitation, and  water brash despite b.i.d. PPIs.  She underwent an EGD with Bravo  placement for further evaluation by Dr. Cira Servant on January 19, 2008.  She  states since her procedure, she has had a significant chest pain as well  as a sensation of burning along her esophagus, she has also noted pain  when she swallows solid food, she denies any problems with liquids  whatsoever.  She denies any fever or chills; otherwise, overall she  feels well.  She is on Omeprazole 20 mg b.i.d.   Dr. Cira Servant has asked her to abstain from alcohol, she continues to  consume alcohol, she was given a prescription for Librium; however, she  is not taking this and is not wanting to pursue treatment with Librium  at this time.   CURRENT MEDICATIONS:  See the list from January 19, 2008.   ALLERGIES:  1. ULTRAM.  2. STEROIDS.  3. CODEINE.   OBJECTIVE:  VITAL SIGNS:  Weight 160 pounds, height 63 inches, temp 98  degrees, blood pressure 118/78, pulse 72.  GENERAL:  [name] is a well-developed, well-nourished female in no  acute distress.  HEENT:  Sclerae are clear and anicteric, conjunctivae pink.  Oropharynx  pink and moist without any lesions.  CHEST:  Heart regular rate and rhythm, normal S1 S2.  ABDOMEN:  Positive bowel sounds +4, no bruits auscultated, soft,  nontender, and nondistended without palpable masses or  hepatosplenomegaly.  EXTREMITIES:  Without clubbing or edema bilaterally.   ASSESSMENT:  [name] is a 60 year old female with refractory  gastroesophageal reflux disease despite intact Nissen fundoplication,  who is currently undergoing Bravo pH  Study and is complaining of burning  chest pain and discomfort with swallowing.  I have discussed this case  with Dr. Jena Gauss.  There is no evidence of perforation.  I feel the  discomfort could be related to Bravo pH probe, which will hopefully  resolve all on its own within the next couple of days.   PLAN:  1. Lortab Elixir 15 mg p.o. q.4-6h p.Amandan., #630 ml, no refill.  2. She is not to take Lortab along with consuming alcoholic beverages      and she agrees with abstaining.  3. She is to call if she is no better or if things get worse, and we      will consider a CT scan of the chest.       [name], N.P.  Electronically Signed     [name], M.D.  Electronically Signed    KJ/MEDQ  D:  01/21/2008  T:  01/21/2008  Job:  [id number]   cc:   [name], M.D.  Amanda Novas, MD

## 2011-03-25 NOTE — Op Note (Signed)
NAMEHALA, NARULA NO.:  [id number]   MEDICAL RECORD NO.:  [id number]          PATIENT TYPE:  AMB   LOCATION:  DAY                           FACILITY:  [facility]   PHYSICIAN:  Kassie Mends, M.D.      DATE OF BIRTH:  [birth year]   DATE OF PROCEDURE:  01/19/2008  DATE OF DISCHARGE:                               OPERATIVE REPORT   INDICATION FOR EXAM:  Ms. Gieselman is a 59 year old female who complains of  uncontrolled reflux symptoms.  The includes hoarseness, heartburn,  regurgitation and water brash.  She continues to have these symptoms on  omeprazole 20 mg b.i.d..  She has a significant past surgical history of  fundoplication.  She had a barium swallow in December 2008 which showed  normal esophageal distention and motility, no esophageal mass or  stricture, no reflux and an intact fundoplication.   FINDINGS:  1. Normal esophagus without evidence of Barrett's mass, erosions,      ulceration or stricture.  The diagnostic gastroscope passed easily      into the stomach.  2. Mild patchy erythema in the antrum with one erosion and no ulcers.      Biopsies obtained via cold forceps to evaluate for H. pylori      gastritis.  3. Normal duodenal bulb and second portion of the duodenum.   DIAGNOSIS:  1. No evidence of erosive esophagitis on the endoscopy.  The Bravo      capsule placed at 29 cm from the teeth to evaluate acid exposure in      the esophagus.  2. Mild gastritis.   RECOMMENDATIONS:  1. Ms. Mcshea will have a 48-hour Bravo study performed.  She is asked      to avoid alcohol during this study.  She continues to smoke two      packs a day.  Combination of alcohol and tobacco was probably      contributing significantly to her reflux.  Her fundoplication seems      to be intact.  2. Will call Ms. Tow with the results of her biopsies.  She is to      continue her omeprazole 20 mg twice daily.  3. Will give her Librium to prevent alcohol withdrawal.  4. She  should avoid gastric irritants.  She is given handout on      gastric irritants and gastritis.  5. No aspirin, NSAIDs for three days.   PROCEDURE TECHNIQUE:  Physical exam was performed.  Informed consent was  obtained from the patient after explaining benefits, risks and  alternatives to the procedure.  The patient connected to monitor and  placed in left lateral position.  Continuous oxygen was provided by  nasal cannula and IV medicine administered through an indwelling  cannula.  After administration of sedation, the patient's esophagus was  intubated.  The scope advanced under direct visualization to the second  portion of the duodenum.  The scope was removed slowly by carefully  examining the color, texture, anatomy and integrity of the mucosa on the  way out.  The GE junction was  identified at 35 cm from the teeth.  The  Bravo capsule was introduced into the esophagus.  Suction was applied  for one minute.  The patient's esophagus was intubated with a diagnostic  gastroscope and placement on the esophageal side wall was confirmed.  The introducer and the diagnostic gastroscope were removed.  The patient  was recovered in endoscopy and discharged home in satisfactory  condition.   MEDICATIONS:  1. Demerol 100 mg IV.  2. Versed 5 mg IV.  3. Phenergan 12.5 mg IV.      Kassie Mends, M.D.  Electronically Signed     SM/MEDQ  D:  01/19/2008  T:  01/20/2008  Job:  [id number]   cc:   Zola Button T. Lazarus Salines, M.D.  Fax: [phone]

## 2011-03-25 NOTE — Assessment & Plan Note (Signed)
NAME:  [name], [name]                CHART#:  [id number]   DATE:  06/26/2008                       DOB:  April 16, 1951   PRIMARY CARE PHYSICIAN:  Melvyn Novas, MD.   Yvonna AlanisZola Button T. Lazarus Salines, M.D.   CHIEF COMPLAINT:  I think I have diverticulitis.   HISTORY OF PRESENT ILLNESS:  The patient is a 60 year old Caucasian  female.  She tells me she has had several bouts of diverticulitis.  She  tells me the last time she was on antibiotics was approximately 3 months  ago.  She tells me she has had 8 bouts of diverticulitis within the last  18 months.  She is having constant pain in bilateral lower quadrants.  She rates the pain 6/10 currently on pain scale.  She is having light  brown stool anywhere from 5 to 10 per day.  She describes it as soft but  not significantly loose.  She denies any rectal bleeding or melena.  Denies any fever or chills.  Her last colonoscopy was in [place] about  6 to 7 years ago.   PAST MEDICAL AND SURGICAL HISTORY:  She is scheduled to have  laryngoscopy with bilateral cord stripping for persistent umbilical cord  abnormality next week.  She has alcohol abuse.  She has history of  chronic GERD.  She had a Bravo capsule study by Dr. Cira Servant on 01/19/2008,  which showed adequate acid suppression on b.i.d. Prilosec.  She is  status post Nissen fundoplication which appears to be intact.  She has  history of nonulcer dyspepsia.  She has history of hypertension,  hyperlipidemia, and hepatitis C, treated at [facility], had been in remission since 1997 or 1998.  She has history of sleep apnea.  She has history of depression and  anxiety.  She tells me she had a normal colonoscopy 6 years ago in  [place], [place].  She has had a complete hysterectomy, appendectomy,  left elbow surgery, and multiple surgeries on her right ankle.   CURRENT MEDICATIONS:  Simvastatin 40 mg daily, omeprazole 10 mg b.i.d.,  and Zetia  10 mg daily.   ALLERGIES:  Ultram causes respiratory distress.  Codeine causes nausea  and vomiting, and steroids cause shakiness and anxiety.   FAMILY HISTORY:  There is no known family history of colorectal  carcinoma, liver or chronic GI problems.  Mother deceased 6, ovarian  carcinoma.  Father deceased 10, unknown etiology.  Multiple siblings are  otherwise healthy.   SOCIAL HISTORY:  The patient is divorced.  She lives alone.  She is  disabled.  She has 40 plus pack-year history of tobacco use.  Currently,  smokes about 2 packs per day.  She has history of IV drug use.  Denies  any current use.  She consumes 1 to 2 six-packs of beer per day.   REVIEW OF SYSTEMS:  See HPI, otherwise negative.   PHYSICAL EXAMINATION:  VITAL SIGNS:  Weight 153 pounds, height 63  inches, temperature 98.4, blood pressure 140/80, and pulse 103.  GENERAL:  The patient is a 60 year old Caucasian female who is alert,  oriented, pleasant, and cooperative, in no acute distress.  HEENT:  Sclerae clear and nonicteric.  Conjunctivae pink.  Oropharynx  pink and moist without any lesions.  NECK:  Supple without mass  or thyromegaly.  CHEST:  Heart regular rate and rhythm.  Normal S1 and S2.  No murmurs,  clicks, rubs, or gallops.  LUNGS:  Clear to auscultation bilaterally.  ABDOMEN:  Positive bowel sounds x4.  No bruits auscultated.  Soft and  nondistended.  She does have moderate tenderness to deep palpation of  bilateral lower quadrants and left side.  There is no rebound,  tenderness, or guarding.  No hepatosplenomegaly or mass.  EXTREMITIES:  Without edema bilaterally.   IMPRESSION:  The patient is a 60 year old Caucasian female with  bilateral lower quadrant abdominal pain, loose stools, and history of  recurrent diverticulitis.  She should have CT scan for further  evaluation to be sure that she does not have diverticulitis or colitis.  If CT is negative, we would proceed with colonoscopy given her  frequent  bouts of diverticulitis.  She may require surgical referral given the  fact that she reports 8 bouts of diverticulitis in the last 18 months.   PLAN:  1. CBC, LFTs, and MET-7.  2. CT of the abdomen and pelvis with IV and oral contrast.  3. If CT is negative, we would proceed with colonoscopy to rule out      occult malignancy with Dr. Cira Servant in the near future.  This      procedure would need to be done in the OR under propofol given her      significant alcohol intake history.   ADDENDUM 16109:  CT Scand- Mild divertirculitis:11/06 (Spl Flx) Definite  evidence of diverticulitis: 10/07(RecSig) 6/08(MidSig). NO EVIDENCE:  82509. TCS >5y ago. Random Bx for microscopic colitis. Could consider  elective hemicolectomy for recurrent diverticulitis.       Lorenza Burton, N.P.  Electronically Signed     Kassie Mends, M.D.  Electronically Signed    KJ/MEDQ  D:  06/27/2008  T:  06/27/2008  Job:  [id number]   cc:   Melvyn Novas, MD

## 2011-03-25 NOTE — H&P (Signed)
[name], MARTEN NO.:  [id number]   MEDICAL RECORD NO.:  [id number]          PATIENT TYPE:  AMB   LOCATION:  DAY                           FACILITY:  [facility]   PHYSICIAN:  Tilford Pillar, MD      DATE OF BIRTH:  02/27/1951   DATE OF ADMISSION:  DATE OF DISCHARGE:  LH                              HISTORY & PHYSICAL   CHIEF COMPLAINT:  Hernia.   HISTORY OF PRESENT ILLNESS:  The patient is a 60 year old female  extremely well known to me for multiple prior surgical procedures, who  presented to my office with noted increasing discomfort around her  umbilicus.  This had occurred over the last couple of months and she had  noted it increasing somewhat in size.  She states that she had felt some  pulling and did have solid popping sensation while she was moving  feedbags for her animals.  Since that time, she has had some constant  discomfort in this area with associated nausea.  She has had no change  in bowel movements.  No melena.  No hematochezia.  No diarrhea.  No  constipation.  She does state that she notes a bulge in this area and  that it does seem to reduce upon being in a supine position.  In  addition, the patient has a chronic history of lower extremity pain for  which she has been on chronic narcotics and is currently being evaluated  and treated by Dr. Gerilyn Pilgrim for continued assistance with these pain  symptomatologies.  She has been tolerating her regular diet and does  state at this time it is likely to be able to have help in assistance  with her animals in order to proceed with the potential repair, as had  been previously discussed with her on the prior office visit.   PAST MEDICAL HISTORY:  Vocal cord dysplasia, hypertension,  hypercholesterolemia, history of sleep apnea requiring CPAP, history of  hepatitis C, history of chronic nerve pain, and history of  diverticulosis.   PAST SURGICAL HISTORY:  Hiatal hernia repair, prior incisional  hernia  repair, hysterectomy, and multiple orthopedic surgeries.  She has had a  sigmoid colectomy for diverticulitis.   MEDICATIONS:  Current medications were reviewed with the patient, new  medications including trazodone, hydrocodone, omeprazole, Zetia,  Benicar, simvastatin, and Ambien.  Again, she is on CPAP at home.   ALLERGIES:  ULTRAM, STEROIDS, and CODEINE.   SOCIAL HISTORY:  She is a 2 pack per day smoker.  She denies any alcohol  or recreational drug use.  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   REVIEW OF SYSTEMS:  Reviewed with the patient.  CONSTITUTIONAL:  Unremarkable.  EYES:  Unremarkable.  EARS, NOSE, AND THROAT:  She has  had a prior vocal cord surgery for premalignant changes.  RESPIRATORY:  Occasional cough, shortness of breath, and wheezing.  CARDIOVASCULAR:  Unremarkable.  GASTROINTESTINAL:  Heartburn and current abdominal wall  discomfort.  GENITOURINARY:  Unremarkable, but she has had past history  of difficulties  with voiding and bladder spasm, requiring urologic  intervention.  MUSCULOSKELETAL:  Arthralgias on the joints.  SKIN:  Unremarkable.  ENDOCRINE:  Cold and heat intolerance, occasional  increased thirst.  NEURO:  Chronic neurologic pain, again for which she  has been evaluated and treated by Dr. Gerilyn Pilgrim.   PHYSICAL EXAMINATION:  GENERAL:  The patient is a relatively thin  female.  She is slightly disheveled, slightly anxious on her appearance.  She is not in any acute distress.  She is alert and oriented x3.  HEENT:  Scalp, no deformities.  Hair is nonbrittle.  No scalp masses  were noted.  Eyes, pupils are equal, round, and reactive to light.  Extraocular movements are intact.  No scleral icterus or conjunctival  pallor is noted.  Oral mucosa is pink, normal occlusion.  NECK:  Trachea is midline.  No cervical lymphadenopathy.  PULMONARY:  Unlabored respiration.  She has full bilateral breath  sounds.   CARDIOVASCULAR:  Regular rate and rhythm.  No murmurs or gallops are  apparent on exam.  She has 2+ radial and dorsalis pedis pulses  bilaterally.  ABDOMEN:  Positive bowel sounds.  Abdomen is soft.  She does have mild  tenderness around the umbilicus.  She does have a small reducible hernia  adjacent to her umbilicus on her midline hand-port incision.  EXTREMITIES:  Warm and dry.   ASSESSMENT AND PLAN:  1. Incisional hernia.  At this time, I had a very long conversation      with the patient regarding her findings.  At this time, I did      discuss the risks, benefits, and alternatives of the hernia repair      based on her extensive past abdominal surgeries.  I advised begin      proceeding with a laparoscopic approach and recommended an anterior      approach.  Additionally, the patient has had significant pain      control issues in the past and I felt that the patient would      unlikely tolerate proceeding with the laparoscopic hernia repair      with this associated increase postoperative pain course.  Based on      this, I did discuss with the patient anterior repair with mesh.      However, in light of this, I did discuss with the patient the need      to limit her postoperative activities to light duty only.  In the      past, this has been extremely difficult secondarily to her raising      of multiple small animals requiring moving feedbags just in the      general care of these animals.  In fact, after her last operation,      which was the laparoscopic hand-assisted sigmoid colectomy, the      patient did return back to her normal activities after only several      days of being discharged and had significant episodes of pain and      discomfort.  Based on this, I stressed the importance of adequate      time for healing and did discuss with the patient the typical      course of the healing cascade.  The patient did see understanding.      I also stressed the importance  of smoking cessation in the      likelihood of improved surgical results.  With that, I did discuss  with the patient the risks and benefits of a hernia repair      including, but not limited to the risk of bleeding, infection,      infection of the mesh requiring the removal of the mesh, and      subsequent repair upon treatment of the infection, possibility of      bowel injury, possibility of recurrence, as well as possibility of      intraoperative cardiac and pulmonary events.  The patient's      questions and concerns were addressed and the patient does wish to      proceed stating that she has adequate time and assistance with      plans to help continue raising her animals.  We will plan to      proceed at the patient's earliest convenience.      Tilford Pillar, MD  Electronically Signed     BZ/MEDQ  D:  01/23/2009  T:  01/24/2009  Job:  [id number]   cc:   [facility]   Kofi A. Gerilyn Pilgrim, M.D.  Fax: [phone]   Dr. Lodema Hong

## 2011-03-25 NOTE — Op Note (Signed)
[name], [name] NO.:  [id number]   MEDICAL RECORD NO.:  [id number]          PATIENT TYPE:  AMB   LOCATION:  DAY                           FACILITY:  [facility]   PHYSICIAN:  Tilford Pillar, MD      DATE OF BIRTH:  Jul 29, 1951   DATE OF PROCEDURE:  DATE OF DISCHARGE:                               OPERATIVE REPORT   CONTINUATION OF THE PROCEDURE:  At this point, general anesthetic was  administered.  Once the patient was asleep, she was endotracheally  intubated by Anesthesia.  At this point, her abdomen was then prepped  with DuraPrep solution and draped in standard fashion.  An incision was  created over the previous hand port scar, left and lateral to the  umbilicus.  Additional dissection down through the subcu tissue was  carried out using the electrocautery.  This was carried out down to the  fascia at which point I did encounter the peritoneal plaque of the  hernia.  I did enter into the sac in order to free the omental fat, but  it has been incarcerated within the hernia.  At this time, I did once  omental adhesions were freed circumferentially off of the anterior  abdominal wall, I palpated the anterior abdominal wall and did not  identify any additional herniations or weakness in the wall.  At this  point, the defect was exposed circumferentially.  I grasped the fascial  edges with Kocher clamps and brought a piece of 5 x 10 cm Erle Crocker  into the field.  Using 2-0 Novofil sutures, I used a series of sutures  to place the mesh in a underlie position.  I continued to place the  sutures and although temporarily placed with hemostat.  Upon placing all  sutures circumferentially, at this point I did pull all sutures snug.  I  was quite pleased with the lie of the mesh, secured all the sutures  circumferentially.  With the mesh in excellent position, I irrigated the  field.  Superiorly and inferiorly I did sew some of the fascial edges  together, they did  come together easily at this point using a 2-0 Vicryl  suture.  The deep subcuticular tissue was then reapproximated using a 3-  0 Vicryl in a running continuous fashion over the repair, 3-0 Vicryl was  also utilized to pack the stalk of the umbilicus back to the anterior  abdominal wall.  A local anesthetic at this point instilled and a 4-0  Monocryl was utilized to reapproximate the skin edges in a running  continuous fashion.  Skin was washed and dried with a moist and dry  towel.  Benzoin was applied around the incision.  Half-inch Steri-Strips  placed.  Drapes were removed.  The patient was allowed to come out  general aesthetic and was transferred back to regular hospital bed.  She  was transferred to Post-Anesthetic Care Unit in stable condition.  At  the conclusion of the procedure, also instrument, sponge, and needle  counts were correct.  The patient tolerated the procedure well.  Tilford Pillar, MD  Electronically Signed     BZ/MEDQ  D:  02/01/2009  T:  02/01/2009  Job:  [id number]   cc:   Dr. Lodema Hong

## 2011-03-25 NOTE — Op Note (Signed)
NAMEMANILLA, [name] NO.:  [id number]   MEDICAL RECORD NO.:  [id number]          PATIENT TYPE:  AMB   LOCATION:  DAY                           FACILITY:  [facility]   PHYSICIAN:  Kassie Mends, M.D.      DATE OF BIRTH:  01/30/1951   DATE OF PROCEDURE:  01/19/2008  DATE OF DISCHARGE:  01/19/2008                               OPERATIVE REPORT   PROCEDURE:  Bravo capsule study.   REFERRING PHYSICIAN:  Zola Button T. Lazarus Salines, M.D.   INDICATION FOR EXAM:  [name] is a 60 year old female who presents in  referral from Dr. Lazarus Salines with a history of gastroesophageal reflux  disease and a Nissen fundoplication in 2003.  She reports being seen by  Dr. Lazarus Salines in late 2008 for frequent throat clearing, hoarseness,  heartburn and regurgitation.  She was placed on omeprazole 20 mg twice a  day.  She had a normal barium swallow in 2008 which showed an intact  Nissen.  She had no evidence of gastroesophageal reflux disease on  barium swallow.   FINDINGS:  On day one of the Bravo study which lasted 23 hours and 17  minutes, she had one episode of reflux.  The episode of reflux lasted  less than a minute.  On day two of the study which lasted 23 hours and  two minutes she had no episodes of reflux.  She reported having  heartburn, regurgitation as well as chest pain on day one and day two of  the study.  She also continued to smoke.  On day one of the study her  Demeester score was 0.4 (normal less than 14.72).  On day two her  Demeester score was a 0.3 (normal less than 14.72).  The total duration  of the study was one day 22 hours and 20 minutes with a one episode of  reflux.  Her symptom association probability was 0.4 with heartburn and  0.3 with chest pain.   RECOMMENDATIONS:  [name] appears to have adequate acid suppression  with Prilosec 20 mg twice daily.  She has had a normal barium swallow.  Her Nissen fundoplication appears to be intact by endoscopy and barium  swallow.   She may have non-ulcer dyspepsia but does not appear to have  any significant esophageal pathology contributing to her symptoms.  She  should continue omeprazole 20 mg twice a day.  She should also attempt  to decrease her tobacco consumption and alcohol consumption.      Kassie Mends, M.D.  Electronically Signed     SM/MEDQ  D:  01/27/2008  T:  01/28/2008  Job:  [id number]   cc:   Zola Button T. Lazarus Salines, M.D.  Fax: [phone]
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
## 2011-03-25 NOTE — Consult Note (Signed)
[name], [name] NO.:  [id number]   MEDICAL RECORD NO.:  [id number]          PATIENT TYPE:  AMB   LOCATION:  DAY                           FACILITY:  [facility]   PHYSICIAN:  Kassie Mends, M.D.      DATE OF BIRTH:  10-Jun-1951   DATE OF CONSULTATION:  DATE OF DISCHARGE:                                 CONSULTATION   REQUESTING PHYSICIAN:  Karol T. Lazarus Salines, M.D.   REASON FOR CONSULTATION:  Refractory GERD.   HISTORY OF PRESENT ILLNESS:  [name] is a 60 year old female.  She has  a history of gastroesophageal reflux disease status post Nissen  fundoplication in 2003 by Dr. Mckinley Jewel. She well was seen by Dr. Lazarus Salines  late 2008 for refractory symptoms including frequent throat clearing,  hoarseness as well as heartburn and regurgitation.  She complains of  significant water brash.  She was placed on omeprazole 20 mg b.i.d. and  continues to complain of the same symptoms.  She denies any dysphagia or  odynophagia.  She denies any anorexia or early satiety.  She denies any  nausea, vomiting or rectal bleeding or melena.  She did have a barium  swallow on November 02, 2007. There was no significant abnormalities,  very minimal molecular pooling and an intact fundoplication without  recurrence of hiatal hernia.   PAST MEDICAL AND SURGICAL HISTORY:  She has hypertension,  hypercholesterolemia, sleep apnea.  She had a history of hepatitis C and  was treated at [facility].  She has  been in remission since 1997 or 98.  She has chronic GERD status post  Nissen fundoplication in [place] by Dr. Mckinley Jewel. She has a history of  depression and anxiety.  She had a colonoscopy 6 years ago in [place]  which she reports was negative.  She has had multiple episodes of  diverticulitis per her report.  She has a history of complete  hysterectomy, hiatal hernia that was repaired at the time of  laparoscopic Nissen fundoplication, appendectomy, left elbow  surgery,  multiple surgeries on her right ankle.   CURRENT MEDICATIONS:  1. Simvastatin 40 mg daily.  2. Calcium 600 mg with vitamin D once daily.  3. Omeprazole 20 mg b.i.d.  4. Benicar 40/12.5 mg daily.  5. Zetia 10 mg q.h.s.   ALLERGIES:  ULTRAM causes respiratory distress, CODEINE  cause nausea  and vomiting and STEROIDS cause shakiness and anxiety.   FAMILY HISTORY:  There is no known family history of colorectal  carcinoma, liver or chronic GI problems.  Mother deceased at 11  secondary to ovarian cancer.  Father deceased at 57 of unknown etiology.  She has multiple siblings with noncontributory history.   SOCIAL HISTORY:  [name] is divorced, she lives alone, she is  disabled.  She has a 40-year history of smoking about two packs per day.  She has a history of IV drug use.  No current use.  She consumes about a  six-pack of beers daily.   REVIEW OF SYSTEMS:  See HPI otherwise negative.   PHYSICAL EXAMINATION:  VITAL  SIGNS:  Weight 158 pound, height 63 inches,  temperature 97, blood pressure 142/66 and pulse 104.  GENERAL:  [name] is an anxious appearing Caucasian female who is  alert, oriented, pleasant, cooperative in no acute distress. HEENT:  Sclera clear, nonicteric. Conjunctiva pink. Oropharynx pink and moist  without lesions.  NECK:  Supple without mass or thyromegaly.  CHEST:  Heart regular rate and normal.  No S1, S2 without murmurs, clicks, rubs  or gallops.  LUNGS:  Clear to auscultation bilaterally.  ABDOMEN:  Positive bowel  sounds x4.  No bruits auscultated.  Soft, nontender, nondistended  without palpable mass or hepatosplenomegaly.  No rebound tenderness or  guarding.  EXTREMITIES:  Without clubbing or edema bilaterally.  SKIN:  Pink, warm and dry without any rash or jaundice.   IMPRESSION:  [name] is a 60 year old female with a history of chronic  gastroesophageal reflux disease status post Nissen fundoplication in  2003.  She has refractory  gastroesophageal reflux disease symptoms  including frequent throat clearing, acid regurgitation, water brash and  heartburn.  She has failed a trial of twice a day proton pump  inhibitors. A  recent barium esophagram shows intact fundoplication, but  I do feel she is going to need further evaluation with  esophagogastroduodenoscopy to be sure that her wrap is tight and  functioning and that there is no evidence of complicated  gastroesophageal reflux disease at this time.  She may benefit from a  Bravo pH study to determine whether she is having acid reflux or non  acid reflux/non-ulcer dyspepsia.   PLAN:  1. EGD with possible Bravo pH probe placement by Dr. Anselmo Rod in      the near future. I  discussed this procedure including the risks      and benefits which include but not limited to bleeding, infection,      perforation and drug reaction. She agrees to plan and consent will      be obtained.  2. She is going the continue Omeprazole 20 mg b.i.d.  3. Further recommendations pending procedure.   Thank you Dr. Lazarus Salines for allowing [place] to participate in the care of [name].      Lorenza Burton, N.P.      Kassie Mends, M.D.  Electronically Signed    KJ/MEDQ  D:  01/14/2008  T:  01/14/2008  Job:  [id number]   cc:   Zola Button T. Lazarus Salines, M.D.  Fax: [phone]

## 2011-03-25 NOTE — Op Note (Signed)
[name], [name] NO.:  [id number]   MEDICAL RECORD NO.:  [id number]          PATIENT TYPE:  AMB   LOCATION:  DAY                           FACILITY:  [facility]   PHYSICIAN:  Kassie Mends, M.D.      DATE OF BIRTH:  07-12-1951   DATE OF PROCEDURE:  07/14/2008  DATE OF DISCHARGE:                               OPERATIVE REPORT   REFERRING PHYSICIAN:  Melvyn Novas, MD   PROCEDURE:  Colonoscopy with cold forceps polypectomy.   INDICATION FOR EXAM:  [name] is a 60 year old female with diarrhea.   FINDINGS:  1. Frequent sigmoid colon and descending colon diverticula.  Thickened      walls in the sigmoid colon.  A 4-mm sessile colon polyp removed via      cold forceps. Random cold forceps biopsies obtained to evaluate for      microscopic colitis.  2. Small internal hemorrhoids.  Otherwise, no masses, inflammatory      changes, or AVMs.   DIAGNOSES:  1. Severe sigmoid colon diverticulosis.  2. Moderate descending colon diverticulosis.  3. Sigmoid colon polyp.  4. Small internal hemorrhoids.   RECOMMENDATIONS:  1. She should follow a high-fiber diet.  She is given a handout on      high-fiber diet, polyps, and diverticulosis.  2. Screening colonoscopy in 10 years if she has a simple adenoma.  3. Will await the results of her random biopsies and treat if positive      for microscopic colitis.  4. No aspirin, NSAIDs, or anticoagulation for 7 days.   MEDICATIONS:  MAC provided by anesthesia.   PROCEDURE TECHNIQUE:  Physical exam was performed.  Informed consent was  obtained from the patient after explaining the benefits, risks, and  alternatives to the procedure.  The patient was connected to the monitor  and placed in the left lateral position.  Continuous oxygen was provided  by nasal cannula.  IV medicine was administered through an indwelling  cannula.  After administration of sedation and rectal exam, the  patient's rectum was intubated and  the scope was advanced under direct  visualization to the distal terminal ileum.  The scope was  removed slowly by carefully examining the color, texture, anatomy, and  integrity of the mucosa on the way out.  The patient was recovered in  endoscopy and discharged home in satisfactory condition.   PATH:  Nl random biopsies. Hyperplastic rectal polyp. TCS in 10 years.      Kassie Mends, M.D.  Electronically Signed     SM/MEDQ  D:  07/14/2008  T:  07/15/2008  Job:  [id number]   cc:   Melvyn Novas, MD  Fax: [phone]

## 2011-03-25 NOTE — Consult Note (Signed)
[name], [name] NO.:  [id number]   MEDICAL RECORD NO.:  [id number]          PATIENT TYPE:  EMS   LOCATION:  ED                            FACILITY:  [facility]   PHYSICIAN:  Dalia Heading, M.D.  DATE OF BIRTH:  24-Aug-1951   DATE OF CONSULTATION:  09/25/2008  DATE OF DISCHARGE:  09/25/2008                                 CONSULTATION   REFERRING PHYSICIAN:  Emergency room.   CHIEF COMPLAINT:  Abdominal pain.   HISTORY OF PRESENT ILLNESS:  The patient is a 60 year old white female  status post sigmoid colectomy by Dr. Tilford Pillar several months ago.  Her postoperative course was remarkable only for nonspecific incisional  pain, which did resolve.  She now presents to the emergency room with a  1-week history of nonspecific abdominal pain.  She states she is also  constipated, though she had a bowel movement yesterday.  She did have  some trail mix with peanuts present in it several days ago.  She denies  any fever, chills, nausea, vomiting.  She states she did have loose  bowel movement yesterday.   Past medical and surgical histories are noted on the chart.   Current medications include Zetia, simvastatin, Bystolic, omeprazole.   ALLERGIES:  CODEINE AND ULTRAM.   REVIEW OF SYSTEMS:  The patient does smoke.  She denies any significant  alcohol use.  She denies any cardiopulmonary difficulties or bleeding  disorders.   On physical examination, the patient is well-developed, well-nourished  white female in no acute distress.  Her temperature is 99.4, blood  pressure 102/63, pulse 93, respirations 20.   Her abdomen is soft and flat.  It is nondistended.  Nonspecific  tenderness is noted to palpation, especially on the left side of the  abdomen.  No rigidity is noted.  No hepatosplenomegaly, masses, hernias  are noted.   White blood cell count is slightly elevated at 12.4, hemoglobin 13.7,  hematocrit 39.3, platelet count 268.  Urinalysis is pending.   CT scan of the abdomen and pelvis reveals pandiverticulosis with a  slight area of mesenteric stranding along the descending colon.  There  was no free fluid or abscess cavity present.  She does have some  peripheral vascular disease.   IMPRESSION:  Mild descending colon diverticulitis without abscess or  perforation.   PLAN:  The patient will be started on ciprofloxacin 500 mg p.o. b.i.d.  and Flagyl 250 mg p.o. t.i.d. x10 days.  She will be also on Lortab  10/650 one tablet p.o. q.4 h. p.r.n. pain, dispense 40, no refills.  She  is to adhere to a diverticular diet.  She is to follow up with Dr.  Leticia Penna in 2 weeks if her symptoms have not resolved.      Dalia Heading, M.D.  Electronically Signed     MAJ/MEDQ  D:  09/25/2008  T:  09/26/2008  Job:  [id number]   cc:   Tilford Pillar, MD  Fax: [phone]   Melvyn Novas, MD  Fax: [phone]
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
## 2011-03-28 NOTE — Op Note (Signed)
NAMEMIKYLAH, ACKROYD NO.:  [id number]   MEDICAL RECORD NO.:  [id number]          PATIENT TYPE:  AMB   LOCATION:  DAY                           FACILITY:  [facility]   PHYSICIAN:  Tilda Burrow, M.D. DATE OF BIRTH:  1951/10/09   DATE OF PROCEDURE:  02/06/2005  DATE OF DISCHARGE:                                 OPERATIVE REPORT   PREOPERATIVE DIAGNOSIS:  Symptomatic right ovarian cyst.   POSTOPERATIVE DIAGNOSIS:  Peritoneal inclusion cyst.   PROCEDURE:  Total abdominal hysterectomy, right salpingo-oophorectomy,  excision of peritoneal inclusive cyst.   SURGEON:  Tilda Burrow, M.D.   ASSISTANTMorrie Sheldon, R.N.   ANESTHESIA:  General.   COMPLICATIONS:  None.   FINDINGS:  An 8-cm peritoneal inclusive cyst attached to the posterior  aspects of the omentum, not attached to bowel or pelvic structures but  resting in the right lower quadrant.   DETAILS OF PROCEDURE:  The patient was taken to the operating room, prepped  and draped for lower abdominal surgery. Pfannenstiel type incision was  repeated, with easy entry into the peritoneal cavity, using blunt technique  that did not have any risks noticed, injury to bowel or underlying tissues.  The omentum was normal, and superficial surface was mobilized upward, and it  revealed the single thin-layered cyst which was completely translucent, able  to be seen through with no solid components. It was very lightly attached to  the posterior aspects of the omentum. It was easily excised from the back  side of the omentum. Bovie cautery was used to control any oozing from the  back side of the omentum. Pelvis was inspected, and the uterus was extremely  _small and mobilel_. Right ovary was normal in appearance. Left adnexa was  gone, and the sigmoid colon was attached to the site where the tube and  ovary had been removed. We were able then take the uterus out in standard  fashion, transecting round ligaments with Bovie  cautery, entering the  retroperitoneum on the right side, identifying the ureter, isolating the  infundibular pelvic ligament on the right, doubly clamping it, ligating it  with 0 chromic. The broad ligament was mobilized, and then the uterine  vessels on each side clamped, cut, and suture ligated with 0 chromic.  Bladder was developed anteriorly. We marched down the upper and lower  cardinal ligaments, taking serial small bites with straight Heaney clamps,  scissors transection and 0 chromic suture ligature. Upon reaching the level  of the cervix, we were able to use a curved Heaney clamp beneath the cervix  on each side, reducing the size of the vaginal cuff, then used Mayo scissors  to excise the cervix and uterus off of the vaginal cuff. Two interrupted 0  chromic sutures completed the vaginal cuff closure. Pelvis was inspected and  found hemostatic. Laparotomy equipment was removed, upper abdomen explored.  Some periumbilical adhesions were easily dissected free. The bowel was  normal in surface appearances. The peritoneum was closed with continuous  running 2-0 chromic, sponge and needle counts being correct. Fascia was  closed with  continuous running 0 Vicryl. Subcutaneous tissues were mobilized  from the  old scar tissue from the prior surgeries and then reapproximated with  interrupted 2-0 plain followed by staple closure of the skin. The patient  tolerated the procedure well and went to recovery room in good condition.  Sponge and needle counts correct. Foley catheter in place.      JVF/MEDQ  D:  02/06/2005  T:  02/06/2005  Job:  [id number]   cc:   Randell Patient  [address]  [place]  [place] [address]  Fax: [phone]

## 2011-03-28 NOTE — Discharge Summary (Signed)
NAMETRICIA, PLEDGER NO.:  [id number]   MEDICAL RECORD NO.:  [id number]          PATIENT TYPE:  INP   LOCATION:  A323                          FACILITY:  [facility]   PHYSICIAN:  Tilford Pillar, MD      DATE OF BIRTH:  [birth year]   DATE OF ADMISSION:  08/07/2008  DATE OF DISCHARGE:  10/03/2009LH                               DISCHARGE SUMMARY   ADMISSION DIAGNOSIS:  History of recurrent diverticulitis.   DISCHARGE DIAGNOSIS:  Status post laparoscopic sigmoid colectomy with  transanal anastomoses.   PROCEDURES:  As above.   ADMITTING PHYSICIAN:  Dr. Tilford Pillar.   CONSULTATIONS:  None.   DISPOSITION:  Home.   BRIEF HISTORY AND PHYSICAL:  Please see the admission history and  physical for the complete history and physical.  The patient is a 60-  year-old female who was referred to my office after a history of  recurrent bouts of diverticulitis.  Her last episode has been  approximately 6 weeks ago, which had been for 8 episodes over about a  year's time.  Based on that we did discuss the risks, benefits, and  alternatives of the laparoscopic possible open sigmoid colectomy and  plans were made and the patient was admitted for the planned procedure.   HOSPITAL COURSE:  The patient was admitted on August 07, 2008, at  which time she underwent the laparoscopic sigmoid colectomy.  Postoperatively, she did well.  She did have some moderate pain issues  upon postoperative day #1 and #2, but these were resolved with  administration of oral pain medication.  On postop day #4, she began to  have flatus.  She was advanced with full diet.  She did have a bowel  movement that was advanced to a regular diet.  On postoperative day #5,  she was tolerating a regular diet.  Her pain was controlled on oral pain  medications.  She was ambulatory.  At this time, it was discussed with  the patient likely discharged to home.  The patient was discharged on  August 14, 2008.   DISCHARGE INSTRUCTIONS:  The patient was instructed that she may resume  a normal diet as tolerated.  She may increase her activity as tolerated  with the exception of no lifting over 20 pounds for the next 3-4 weeks.  She may shower.  She was instructed not to soak the area for the next 3-  4 weeks.  She was instructed to see me in 2 weeks for staple removal.   DISCHARGE MEDICATIONS:  The patient is to continue all previously  prescribed home medications.  Lortab 5/500 one to two p.o. q.4 h. p.r.n.  pain.       Tilford Pillar, MD  Electronically Signed     BZ/MEDQ  D:  08/13/2008  T:  08/13/2008  Job:  [id number]

## 2011-04-07 ENCOUNTER — Other Ambulatory Visit: Payer: Self-pay | Admitting: Family Medicine

## 2011-04-08 ENCOUNTER — Other Ambulatory Visit: Payer: Self-pay | Admitting: Family Medicine

## 2011-04-08 ENCOUNTER — Encounter: Payer: Self-pay | Admitting: Family Medicine

## 2011-04-09 ENCOUNTER — Encounter: Payer: Self-pay | Admitting: Family Medicine

## 2011-04-09 ENCOUNTER — Ambulatory Visit (INDEPENDENT_AMBULATORY_CARE_PROVIDER_SITE_OTHER): Payer: Medicare Other | Admitting: Family Medicine

## 2011-04-09 VITALS — BP 120/60 | HR 70 | Resp 16 | Ht 64.0 in | Wt 123.0 lb

## 2011-04-09 DIAGNOSIS — R5381 Other malaise: Secondary | ICD-10-CM

## 2011-04-09 DIAGNOSIS — E785 Hyperlipidemia, unspecified: Secondary | ICD-10-CM

## 2011-04-09 DIAGNOSIS — R5383 Other fatigue: Secondary | ICD-10-CM

## 2011-04-09 DIAGNOSIS — F17200 Nicotine dependence, unspecified, uncomplicated: Secondary | ICD-10-CM

## 2011-04-09 DIAGNOSIS — M79609 Pain in unspecified limb: Secondary | ICD-10-CM

## 2011-04-09 DIAGNOSIS — I10 Essential (primary) hypertension: Secondary | ICD-10-CM

## 2011-04-09 NOTE — Patient Instructions (Signed)
F/u in 4 months.  You will need to f/u with cardiology, pls make your appt , since they have sent you a notice and you are experiencing increased fatigue.  We will refer you to a pulmonologist in [place].  We will send to rheumatologist for recent labs. Will need cbc and tsh if none were done

## 2011-04-16 ENCOUNTER — Telehealth: Payer: Self-pay | Admitting: Family Medicine

## 2011-04-16 NOTE — Telephone Encounter (Signed)
Spoke with pt , she has an appt in the am with me will f/u at that time, I have the report

## 2011-04-17 ENCOUNTER — Encounter: Payer: Self-pay | Admitting: Family Medicine

## 2011-04-17 ENCOUNTER — Ambulatory Visit (INDEPENDENT_AMBULATORY_CARE_PROVIDER_SITE_OTHER): Payer: Medicare Other | Admitting: Family Medicine

## 2011-04-17 VITALS — BP 126/64 | HR 78 | Resp 16 | Ht 64.0 in | Wt 123.0 lb

## 2011-04-17 DIAGNOSIS — I10 Essential (primary) hypertension: Secondary | ICD-10-CM

## 2011-04-17 DIAGNOSIS — R928 Other abnormal and inconclusive findings on diagnostic imaging of breast: Secondary | ICD-10-CM

## 2011-04-17 DIAGNOSIS — G4700 Insomnia, unspecified: Secondary | ICD-10-CM

## 2011-04-17 NOTE — Patient Instructions (Signed)
F/u as before.  You will be referred for surgical evaluation and management of your abormal test in [place] as soon as possible

## 2011-04-18 ENCOUNTER — Telehealth: Payer: Self-pay | Admitting: Family Medicine

## 2011-04-18 ENCOUNTER — Encounter: Payer: Self-pay | Admitting: Family Medicine

## 2011-04-20 ENCOUNTER — Other Ambulatory Visit: Payer: Self-pay | Admitting: Family Medicine

## 2011-04-20 ENCOUNTER — Encounter: Payer: Self-pay | Admitting: Family Medicine

## 2011-04-20 DIAGNOSIS — R0602 Shortness of breath: Secondary | ICD-10-CM

## 2011-04-20 NOTE — Assessment & Plan Note (Signed)
Controlled, pain management is through pain clinic

## 2011-04-20 NOTE — Assessment & Plan Note (Signed)
Controlled , when last checked, rept studies are due

## 2011-04-20 NOTE — Assessment & Plan Note (Signed)
Deteriorted, has upcoming cardiology appt which is due, she will schedule. Also to be referred to pulmonary specialist for eval of this problem

## 2011-04-20 NOTE — Assessment & Plan Note (Signed)
Unchanged, counseled again to quit esp in light of increasing fatigue

## 2011-04-20 NOTE — Progress Notes (Signed)
  Subjective:    Patient ID: [name], female    DOB: [birth year], 60 y.o.   MRN: [id number]  HPI The PT is here for follow up and re-evaluation of chronic medical conditions, medication management and review of recent lab and radiology data.  Preventive health is updated, specifically  Cancer screening,  and Immunization.   She c/o increasing dyspnea with minimal activity, she continues to smoke with no plan to quit  At this time. Has been notified of need for cardiology f/u and will make appt   Review of Systems    Denies recent fever or chills. Denies sinus pressure, ear pain or sore throat.seasonal allergies are controlled for the most part wit mediction Denies chest congestion, productive cough or wheezing. Denies chest pains, palpitations, paroxysmal nocturnal dyspnea, orthopnea and leg swelling Denies abdominal pain, nausea, vomiting,diarrhea or constipation.  Denies rectal bleeding or change in bowel movement. Denies dysuria, frequency, hesitancy or incontinence.  Denies headaches, seizure, numbness, or tingling. Denies depression or uncontrolled anxiety , anxiety or insomnia. Denies skin break down or rash.     Objective:   Physical Exam Patient alert and oriented and in no Cardiopulmonary distress.  HEENT: No facial asymmetry, EOMI, no sinus tenderness, Oropharynx pink and moist.  Neck decreased ROM no adenopathy.  Chest: Clear to auscultation bilaterally.Decreased air entry throughout  CVS: S1, S2 no murmurs, no S3.  ABD: Soft non tender. Bowel sounds normal.  Ext: No edema  MS: decreased  ROM spine, shoulders, hips and knees.  Skin: Intact, no ulcerations or rash noted.  Psych: Good eye contact, normal affect. Memory intact  anxious   CNS: CN 2-12 intact, .        Assessment & Plan:

## 2011-04-24 ENCOUNTER — Encounter: Payer: Self-pay | Admitting: Family Medicine

## 2011-04-26 NOTE — Assessment & Plan Note (Signed)
Controlled, no change in medication  

## 2011-04-26 NOTE — Assessment & Plan Note (Signed)
New diagnosis pt referred to surgeon for further evaluation ad management, she is anxious to have this done  in [place], asap

## 2011-04-26 NOTE — Progress Notes (Signed)
  Subjective:    Patient ID: [name], female    DOB: [birth year], 60 y.o.   MRN: [id number]  HPI Pt recently diagnosed with abn calcifications on mammogram, suspiscious for cancer, needing biopsy. She is here to discuss this as well as to be referred for further management . She is anxious about this understandably so.No other concerns at this visit She is somewhat confused, stating she is under the impression that treatment in the form of radiation can be administered during the biopsy, which I am certainly unaware of.sure she will be referred to an expert in the filesd who will clarify the dx and then appropriate treatment will be started    Review of Systems Denies recent fever or chills. Denies sinus pressure, nasal congestion, ear pain or sore throat. Chronic smoker's cough Denies chest pains, palpitations, paroxysmal nocturnal dyspnea, orthopnea and leg swelling  Denies dysuria, frequency, hesitancy or incontinence. Chronic joint pain and reduced mobility Denies headaches, seizure, numbness, or tingling. Chronic  anxiety  And insomnia on medication Denies skin break down or rash.       Objective:   Physical Exam Patient alert and oriented and in no Cardiopulmonary distress.  HEENT: No facial asymmetry, EOMI, no sinus tenderness, TM's clear, Oropharynx pink and moist.  Neck supple no adenopathy.  Chest: Clear to auscultation bilaterally.  CVS: S1, S2 no murmurs, no S3.    Psych: Good eye contact, normal affect. Memory intact  anxious CNS: CN 2-12 intact,   Assessment & Plan:

## 2011-04-29 NOTE — Telephone Encounter (Signed)
Patient has appointment.

## 2011-04-30 ENCOUNTER — Ambulatory Visit: Payer: Medicare Other | Admitting: Family Medicine

## 2011-05-01 ENCOUNTER — Encounter (INDEPENDENT_AMBULATORY_CARE_PROVIDER_SITE_OTHER): Payer: Self-pay | Admitting: General Surgery

## 2011-05-05 ENCOUNTER — Other Ambulatory Visit (INDEPENDENT_AMBULATORY_CARE_PROVIDER_SITE_OTHER): Payer: Self-pay | Admitting: General Surgery

## 2011-05-05 ENCOUNTER — Encounter (INDEPENDENT_AMBULATORY_CARE_PROVIDER_SITE_OTHER): Payer: Self-pay | Admitting: General Surgery

## 2011-05-05 ENCOUNTER — Ambulatory Visit (INDEPENDENT_AMBULATORY_CARE_PROVIDER_SITE_OTHER): Payer: Medicare Other | Admitting: General Surgery

## 2011-05-05 VITALS — BP 106/58 | HR 68 | Temp 98.1°F | Resp 12 | Ht 63.0 in | Wt 121.4 lb

## 2011-05-05 DIAGNOSIS — R920 Mammographic microcalcification found on diagnostic imaging of breast: Secondary | ICD-10-CM

## 2011-05-05 NOTE — Progress Notes (Signed)
Subjective:     Patient ID: [name], female   DOB: 1951/08/22, 60 y.o.   MRN: [id number]    BP 106/58  Pulse 68  Temp(Src) 98.1 F (36.7 C) (Temporal)  Resp 12  Ht 5\' 3"  (1.6 m)  Wt 121 lb 6.4 oz (55.067 kg)  BMI 21.51 kg/m2    HPI This is a 25- white female referred to me to proceed Dr. Syliva Overman for evaluation of an abnormal mammogram of the right breast. She has never had a breast problem in the past, and she states that she has never had an abnormal mammogram in the past. Recent mammograms there is now a new,tight cluster of microcalcifications within the upper, slightly inner quadrant of the right breast. This is in the mid third of the breast. Magnification views are enclosed. I have reviewed these images. She denies palpation of the mass. She denies nipple discharge. She has not had any biopsy yet.  Review of Systems  Constitutional: Negative.   HENT: Negative.   Eyes: Negative.   Respiratory: Positive for cough and wheezing. Negative for apnea, choking, chest tightness, shortness of breath and stridor.   Cardiovascular: Negative.   Gastrointestinal: Negative.   Genitourinary: Negative.   Skin: Negative.   Neurological: Positive for syncope.  Hematological: Negative.   Psychiatric/Behavioral: The patient is nervous/anxious and is hyperactive.        Objective:   Physical Exam  Constitutional: She is oriented to person, place, and time.  HENT:  Head: Normocephalic and atraumatic.  Mouth/Throat: Oropharynx is clear and moist.  Eyes: Right eye exhibits no discharge. Left eye exhibits no discharge. No scleral icterus.  Neck: Normal range of motion. Neck supple. No JVD present. No tracheal deviation present. No thyromegaly present.  Cardiovascular: Normal rate, regular rhythm and normal heart sounds.  Exam reveals no gallop and no friction rub.   Pulmonary/Chest: No respiratory distress. She has no wheezes. She has rales. She exhibits no mass. Right breast  exhibits no inverted nipple, no mass, no nipple discharge, no skin change and no tenderness. Left breast exhibits no inverted nipple, no mass, no nipple discharge, no skin change and no tenderness. Breasts are symmetrical.    Abdominal: Soft. Normal appearance and bowel sounds are normal. She exhibits no shifting dullness, no distension, no pulsatile liver and no abdominal bruit. There is hepatosplenomegaly. There is no rebound, no CVA tenderness, no tenderness at McBurney's point and negative Murphy's sign.    Musculoskeletal: Normal range of motion. She exhibits tenderness.       Tender right ankle  Lymphadenopathy:    She has no cervical adenopathy.  Neurological: She is alert and oriented to person, place, and time. Coordination normal.  Skin: Skin is warm and dry. No rash noted. She is not diaphoretic. No erythema.  Psychiatric:       Anxious and aggitated about delays.       Assessment:     Abnormal mammogram of the right breast with clustered microcalcifications in the upper inner quadrant. Biopsy of this is indicated  to rule out occult carcinoma.    Plan:     The patient is referred to the breast center and is brought for a second opinion regarding her mammograms, and image guided biopsy. She will return to see me after this is done and we will decide what the next steps are. She is advised that she may need a complete excision of this area and possibly further cancer surgery including lymph node  dissection if this turns out to be malignant. She is willing to undergo this.

## 2011-05-15 ENCOUNTER — Ambulatory Visit
Admission: RE | Admit: 2011-05-15 | Discharge: 2011-05-15 | Disposition: A | Discharge status: Home / Self Care | Payer: Medicare Other | Source: Ambulatory Visit | Attending: General Surgery | Admitting: General Surgery

## 2011-05-15 ENCOUNTER — Other Ambulatory Visit: Payer: Self-pay | Admitting: Diagnostic Radiology

## 2011-05-15 DIAGNOSIS — R920 Mammographic microcalcification found on diagnostic imaging of breast: Secondary | ICD-10-CM

## 2011-05-28 ENCOUNTER — Ambulatory Visit (HOSPITAL_COMMUNITY)
Admission: RE | Admit: 2011-05-28 | Discharge: 2011-05-28 | Disposition: A | Discharge status: Home / Self Care | Payer: Medicare Other | Source: Ambulatory Visit | Attending: Pulmonary Disease | Admitting: Pulmonary Disease

## 2011-05-28 DIAGNOSIS — R0602 Shortness of breath: Secondary | ICD-10-CM | POA: Insufficient documentation

## 2011-05-28 LAB — BLOOD GAS, ARTERIAL
Acid-base deficit: 0.2 mmol/L (ref 0.0–2.0)
Bicarbonate: 24.2 mEq/L — ABNORMAL HIGH (ref 20.0–24.0)
FIO2: 21 %
O2 Saturation: 96.7 %
Patient temperature: 37
TCO2: 21.1 mmol/L (ref 0–100)

## 2011-05-28 MED ORDER — ALBUTEROL SULFATE (2.5 MG/3ML) 0.083% IN NEBU
2.5000 mg | INHALATION_SOLUTION | Freq: Once | RESPIRATORY_TRACT | Status: AC
  Administered 2011-05-28: 2.5 mg via RESPIRATORY_TRACT
  Filled 2011-05-28: qty 3

## 2011-05-30 NOTE — Procedures (Signed)
NAMEARANDA, BIHM NO.:  [id number]  MEDICAL RECORD NO.:  [id number]  LOCATION:                                 FACILITY:  PHYSICIAN:  Emmilynn Marut L. Juanetta Gosling, M.D.DATE OF BIRTH:  March 07, 1951  DATE OF PROCEDURE: DATE OF DISCHARGE:                           PULMONARY FUNCTION TEST   Reason for pulmonary function testing is shortness of breath and cough. 1. Spirometry does not show a ventilatory defect, but does show some     airflow obstruction most marked in the smaller airways. 2. Lung volumes are normal. 3. DLCO is mildly reduced. 4. Arterial blood gas is normal. 5. There is no significant bronchodilator improvement. 6. Noting the smoking history this is consistent with COPD.     Renard Caperton L. Juanetta Gosling, M.D.     ELH/MEDQ  D:  05/29/2011  T:  05/29/2011  Job:  [id number]

## 2011-06-10 NOTE — Telephone Encounter (Signed)
Transportation worked out per patient confirmation

## 2011-07-08 ENCOUNTER — Other Ambulatory Visit: Payer: Self-pay | Admitting: Family Medicine

## 2011-07-09 ENCOUNTER — Ambulatory Visit: Payer: Medicare Other | Admitting: Family Medicine

## 2011-07-29 ENCOUNTER — Encounter: Payer: Self-pay | Admitting: Family Medicine

## 2011-07-30 ENCOUNTER — Encounter: Payer: Self-pay | Admitting: Family Medicine

## 2011-07-30 ENCOUNTER — Ambulatory Visit: Payer: Medicare Other | Admitting: Family Medicine

## 2011-07-31 ENCOUNTER — Ambulatory Visit (INDEPENDENT_AMBULATORY_CARE_PROVIDER_SITE_OTHER): Payer: Medicare Other | Admitting: Family Medicine

## 2011-07-31 ENCOUNTER — Encounter: Payer: Self-pay | Admitting: Family Medicine

## 2011-07-31 VITALS — BP 110/60 | HR 62 | Resp 16 | Ht 64.0 in | Wt 120.1 lb

## 2011-07-31 DIAGNOSIS — Z9181 History of falling: Secondary | ICD-10-CM

## 2011-07-31 DIAGNOSIS — Z23 Encounter for immunization: Secondary | ICD-10-CM

## 2011-07-31 DIAGNOSIS — R002 Palpitations: Secondary | ICD-10-CM

## 2011-07-31 DIAGNOSIS — E785 Hyperlipidemia, unspecified: Secondary | ICD-10-CM

## 2011-07-31 DIAGNOSIS — R7301 Impaired fasting glucose: Secondary | ICD-10-CM

## 2011-07-31 DIAGNOSIS — R296 Repeated falls: Secondary | ICD-10-CM

## 2011-07-31 DIAGNOSIS — R5383 Other fatigue: Secondary | ICD-10-CM

## 2011-07-31 DIAGNOSIS — R5381 Other malaise: Secondary | ICD-10-CM

## 2011-07-31 DIAGNOSIS — M899 Disorder of bone, unspecified: Secondary | ICD-10-CM

## 2011-07-31 DIAGNOSIS — F17200 Nicotine dependence, unspecified, uncomplicated: Secondary | ICD-10-CM

## 2011-07-31 DIAGNOSIS — I10 Essential (primary) hypertension: Secondary | ICD-10-CM

## 2011-07-31 DIAGNOSIS — Z8679 Personal history of other diseases of the circulatory system: Secondary | ICD-10-CM

## 2011-07-31 MED ORDER — DILTIAZEM HCL ER COATED BEADS 120 MG PO CP24: 120.0000 mg | ORAL_CAPSULE | Freq: Every day | ORAL | Status: DC

## 2011-07-31 MED ORDER — AMLODIPINE BESYLATE 5 MG PO TABS: 5.0000 mg | ORAL_TABLET | Freq: Every day | ORAL | Status: DC

## 2011-07-31 NOTE — Patient Instructions (Addendum)
F/u in 2.5 months  Stop metoprolol  per your request, new medication for blood pressure is amlodipine, start tomorrow.  We will contact [facility] to see if they can do an independent mobility assesment and let you know  Fasting labs are past due and should be done prior to f/u visit

## 2011-07-31 NOTE — Progress Notes (Signed)
  Subjective:    Patient ID: [name], female    DOB: [birth year], 59 y.o.   MRN: [id number]  HPI Pt requests that she get a new blood pressure medication, believes that the current  Medication may be causing ringing in her ears. States she fell 06/28/2011 outside, right ankle twisted, knees buckled, went forward, and knees folded over backwards, entire ankle became black and swollen Reports  Falling approx once per month in her house since this year, reports poor sensation in the foot, has nerve damage in the right leg, and also and poor mobility in  The right ankle.it does not pivot. Emotionally distraught that she has not qualified  For a power wheelchair, wonders if there is anything else that can be done to assist esp in light of history. She has had one negative breast biopsy and has another in January   Review of Systems See HPI Denies recent fever or chills. Denies sinus pressure, nasal congestion, ear pain or sore throat. Denies chest congestion, productive cough or wheezing. Denies chest pains, palpitations and leg swelling Denies abdominal pain, nausea, vomiting,diarrhea or constipation.   Denies dysuria, frequency, hesitancy or incontinence. Denies headaches, seizures, numbness, or tingling. Denies uncontrolled  depression, anxiety or insomnia. Denies skin break down or rash.        Objective:   Physical Exam Patient alert and oriented and in no cardiopulmonary distress.  HEENT: No facial asymmetry, EOMI, no sinus tenderness,  oropharynx pink and moist.  Neck supple no adenopathy.  Chest: Clear to auscultation bilaterally.Decreased air entry bilaterally  CVS: S1, S2 no murmurs, no S3.  ABD: Soft non tender. Bowel sounds normal.  Ext: No edema  MS: Adequate ROM spine, shoulders, hips and knees.  Skin: Intact, no ulcerations or rash noted.  Psych: Good eye contact, normal affect. Memory intact mildly anxious not  depressed appearing.  CNS: CN 2-12 intact,  power,  normal throughout.        Assessment & Plan:

## 2011-08-01 NOTE — Assessment & Plan Note (Signed)
Medication changed per pt to alternative drug, amlodipine, to se if any benefit as far as tinnitus symptom is concerned

## 2011-08-01 NOTE — Assessment & Plan Note (Signed)
Labs past due, none in nearly 1 year, pt to have labs prior to f/u

## 2011-08-01 NOTE — Assessment & Plan Note (Signed)
Unchanged, no plans to quit 

## 2011-08-07 ENCOUNTER — Other Ambulatory Visit: Payer: Self-pay | Admitting: Family Medicine

## 2011-08-11 LAB — DIFFERENTIAL
Basophils Relative: 0
Eosinophils Absolute: 0.2
Eosinophils Absolute: 0.2
Eosinophils Absolute: 0.4
Eosinophils Absolute: 0.4
Eosinophils Relative: 2
Eosinophils Relative: 4
Eosinophils Relative: 5
Lymphocytes Relative: 13
Lymphocytes Relative: 18
Lymphocytes Relative: 6 — ABNORMAL LOW
Lymphs Abs: 0.4 — ABNORMAL LOW
Lymphs Abs: 0.6 — ABNORMAL LOW
Lymphs Abs: 1.2
Lymphs Abs: 1.4
Monocytes Absolute: 0.9
Monocytes Absolute: 0.7
Monocytes Absolute: 0.9
Monocytes Relative: 8
Monocytes Relative: 10
Monocytes Relative: 11
Neutrophils Relative %: 86 — ABNORMAL HIGH
Neutrophils Relative %: 74

## 2011-08-11 LAB — BASIC METABOLIC PANEL
BUN: 3 — ABNORMAL LOW
BUN: 4 — ABNORMAL LOW
CO2: 28
CO2: 28
CO2: 26
Calcium: 9.4
Chloride: 96
Chloride: 102
Chloride: 95 — ABNORMAL LOW
Chloride: 98
Creatinine, Ser: 0.72
GFR calc Af Amer: >60
GFR calc Af Amer: >60
GFR calc non Af Amer: >60
GFR calc non Af Amer: >60
GFR calc non Af Amer: >60
Glucose, Bld: 134 — ABNORMAL HIGH
Glucose, Bld: 123 — ABNORMAL HIGH
Glucose, Bld: 144 — ABNORMAL HIGH
Potassium: 3.8
Potassium: 4.3
Potassium: 3.1 — ABNORMAL LOW
Potassium: 3.5
Potassium: 4
Sodium: 129 — ABNORMAL LOW
Sodium: 137
Sodium: 132 — ABNORMAL LOW
Sodium: 132 — ABNORMAL LOW
Sodium: 134 — ABNORMAL LOW

## 2011-08-11 LAB — CBC
HCT: 37.6
HCT: 40.4
HCT: 34.7 — ABNORMAL LOW
HCT: 37.6
HCT: 39
Hemoglobin: 13.2
Hemoglobin: 13.9
Hemoglobin: 12.2
Hemoglobin: 13.6
MCHC: 35.1
MCV: 93.8
MCV: 93.5
MCV: 93.8
MCV: 94.4
Platelets: 246
RBC: 4
RBC: 4.25
RBC: 3.98
RDW: 13.1
RDW: 12.9
WBC: 11.7 — ABNORMAL HIGH
WBC: 8.2
WBC: 9.2
WBC: 9.9

## 2011-08-11 LAB — CROSSMATCH: Antibody Screen: NEGATIVE

## 2011-08-11 LAB — PHOSPHORUS
Phosphorus: 4
Phosphorus: 3.4

## 2011-08-11 LAB — MAGNESIUM: Magnesium: 1.9

## 2011-08-12 LAB — COMPREHENSIVE METABOLIC PANEL
ALT: 20
BUN: 13
CO2: 28
Calcium: 9.3
GFR calc non Af Amer: >60
Glucose, Bld: 91
Sodium: 136
Total Protein: 7

## 2011-08-12 LAB — DIFFERENTIAL
Basophils Relative: 0
Eosinophils Absolute: 0.2
Lymphs Abs: 1.9
Monocytes Relative: 8
Neutro Abs: 9.3 — ABNORMAL HIGH
Neutrophils Relative %: 75

## 2011-08-12 LAB — CBC
HCT: 39.3
Hemoglobin: 13.7
MCHC: 34.8
MCV: 91.3
RBC: 4.3
RDW: 13.7

## 2011-08-12 LAB — URINALYSIS, ROUTINE W REFLEX MICROSCOPIC
Glucose, UA: NEGATIVE
Protein, ur: NEGATIVE
Specific Gravity, Urine: 1.02
Urobilinogen, UA: 0.2

## 2011-08-12 LAB — URINE CULTURE

## 2011-10-15 ENCOUNTER — Encounter: Payer: Self-pay | Admitting: Family Medicine

## 2011-10-17 ENCOUNTER — Encounter: Payer: Self-pay | Admitting: Family Medicine

## 2011-10-17 ENCOUNTER — Ambulatory Visit (INDEPENDENT_AMBULATORY_CARE_PROVIDER_SITE_OTHER): Payer: Medicare Other | Admitting: Family Medicine

## 2011-10-17 VITALS — BP 128/60 | HR 80 | Resp 16 | Ht 64.0 in | Wt 119.0 lb

## 2011-10-17 DIAGNOSIS — I10 Essential (primary) hypertension: Secondary | ICD-10-CM

## 2011-10-17 DIAGNOSIS — R928 Other abnormal and inconclusive findings on diagnostic imaging of breast: Secondary | ICD-10-CM

## 2011-10-17 DIAGNOSIS — F17200 Nicotine dependence, unspecified, uncomplicated: Secondary | ICD-10-CM

## 2011-10-17 DIAGNOSIS — R002 Palpitations: Secondary | ICD-10-CM

## 2011-10-17 DIAGNOSIS — Z87898 Personal history of other specified conditions: Secondary | ICD-10-CM | POA: Insufficient documentation

## 2011-10-17 DIAGNOSIS — Z8709 Personal history of other diseases of the respiratory system: Secondary | ICD-10-CM

## 2011-10-17 DIAGNOSIS — E785 Hyperlipidemia, unspecified: Secondary | ICD-10-CM

## 2011-10-17 DIAGNOSIS — K219 Gastro-esophageal reflux disease without esophagitis: Secondary | ICD-10-CM

## 2011-10-17 MED ORDER — DILTIAZEM HCL ER COATED BEADS 120 MG PO CP24: 120.0000 mg | ORAL_CAPSULE | Freq: Every day | ORAL | Status: DC

## 2011-10-17 NOTE — Assessment & Plan Note (Signed)
3 month h/o intermittent left epistaxis, on average 3 times per week refer to ENT

## 2011-10-17 NOTE — Assessment & Plan Note (Addendum)
rept test due requests [place] , will schedule

## 2011-10-17 NOTE — Assessment & Plan Note (Signed)
rept labs today, low fat diet encouraged, continue meds

## 2011-10-17 NOTE — Assessment & Plan Note (Signed)
Unchanged, encouraged to, and needs to quit

## 2011-10-17 NOTE — Assessment & Plan Note (Signed)
Reports episodic palpitations with rapid heart rate, put on plavix by cardiology for this. Recent episode of central chest pain which awakened pt, states her card f/u is past due, h/o collapsing in card office in the past will refer for eval of both

## 2011-10-17 NOTE — Assessment & Plan Note (Signed)
Controlled, but pt wants to resume previous med since states ringing ion her ears is unchanged

## 2011-10-17 NOTE — Progress Notes (Signed)
  Subjective:    Patient ID: [name], female    DOB: January 07, 1951, 60 y.o.   MRN: [id number]  HPI  Pt in for follow up of chronic problems, med review, and she is getting labs today. She had an abnormal mammogram in July , which needs to be repeated in January, we will schedule. She has an approximately 4 month h/o intermittent left sided nose bleed, denies sinus pressure aor excessive mucous from nostril, no fever or chills. States approx 2 weeks ago she was awakened from her sleep by central chest pain, took deep breaths and it resolved. States she has been followed by [facility] cardiology in the past for irregular heartbeat and palpitations and needs to return for review.also concerned about recent central chest pain  Review of Systems See HPI Denies recent fever or chills. Denies sinus pressure, nasal congestion, ear pain or sore throat. Denies chest congestion, continues to smoke , no plan to quit at this time Denies PND, orthopnea  and leg swelling Denies abdominal pain, nausea, vomiting,diarrhea or constipation.   Denies dysuria, frequency, hesitancy or incontinence. Chronic joint pain, managed  By pain specialist  Denies headaches, seizures, numbness, or tingling. Denies uncontrolled depression, anxiety or insomnia. Denies skin break down or rash.        Objective:   Physical Exam Patient alert and oriented and in no cardiopulmonary distress.  HEENT: No facial asymmetry, EOMI, no sinus tenderness,  oropharynx pink and moist.  Neck supple no adenopathy.  Chest: Clear to auscultation bilaterally.Decreased air entry   CVS: S1, S2 no murmurs, no S3.  ABD: Soft non tender. Bowel sounds normal.  Ext: No edema  MS: Adequate ROM spine, shoulders, hips and knees.  Skin: Intact, no ulcerations or rash noted.  Psych: Good eye contact, normal affect. Not anxious or depressed appearing.  CNS: CN 2-12 intact, power, tone and sensation normal throughout.          Assessment & Plan:

## 2011-10-17 NOTE — Patient Instructions (Signed)
F/u in 4 months.  You are referred for mammogram in January when due , also to cardiology and ENT.  You need to stop smoking.  Blood pressure medication has been changed back to the previous drug per your request.  All the best, and call if you need to see me before

## 2011-10-17 NOTE — Assessment & Plan Note (Signed)
Controlled, no change in medication  

## 2011-10-18 LAB — CBC WITH DIFFERENTIAL/PLATELET
Basophils Relative: 0 % (ref 0–1)
Eosinophils Absolute: 0.2 10*3/uL (ref 0.0–0.7)
Eosinophils Relative: 2 % (ref 0–5)
HCT: 49.5 % — ABNORMAL HIGH (ref 36.0–46.0)
Hemoglobin: 16.5 g/dL — ABNORMAL HIGH (ref 12.0–15.0)
MCH: 32 pg (ref 26.0–34.0)
MCHC: 33.3 g/dL (ref 30.0–36.0)
MCV: 96.1 fL (ref 78.0–100.0)
Monocytes Absolute: 0.7 10*3/uL (ref 0.1–1.0)
Monocytes Relative: 6 % (ref 3–12)

## 2011-10-18 LAB — HEPATIC FUNCTION PANEL
ALT: 13 U/L (ref 0–35)
AST: 16 U/L (ref 0–37)
Bilirubin, Direct: 0.1 mg/dL (ref 0.0–0.3)
Indirect Bilirubin: 0.2 mg/dL (ref 0.0–0.9)
Total Bilirubin: 0.3 mg/dL (ref 0.3–1.2)

## 2011-10-18 LAB — BASIC METABOLIC PANEL
BUN: 12 mg/dL (ref 6–23)
CO2: 28 mEq/L (ref 19–32)
Calcium: 9.7 mg/dL (ref 8.4–10.5)
Glucose, Bld: 94 mg/dL (ref 70–99)

## 2011-10-18 LAB — LIPID PANEL: Cholesterol: 166 mg/dL (ref 0–200)

## 2011-10-18 LAB — VITAMIN D 25 HYDROXY (VIT D DEFICIENCY, FRACTURES): Vit D, 25-Hydroxy: 45 ng/mL (ref 30–89)

## 2011-10-19 ENCOUNTER — Other Ambulatory Visit: Payer: Self-pay | Admitting: Family Medicine

## 2011-10-19 DIAGNOSIS — D72829 Elevated white blood cell count, unspecified: Secondary | ICD-10-CM

## 2011-10-20 NOTE — Progress Notes (Signed)
Faxed over information to dr. Mariel Sleet office. They will call pt with appt and time

## 2011-10-20 NOTE — Progress Notes (Signed)
Pt was referred to dr. Mariel Sleet office. They will call her with appt and time.

## 2011-10-20 NOTE — Progress Notes (Signed)
Sent information over to dr. Arnell Asal office. They will call pt with appt and time.

## 2011-10-24 ENCOUNTER — Ambulatory Visit: Payer: Medicare Other | Admitting: Adult Health

## 2011-10-24 ENCOUNTER — Telehealth: Payer: Self-pay

## 2011-10-24 ENCOUNTER — Ambulatory Visit (INDEPENDENT_AMBULATORY_CARE_PROVIDER_SITE_OTHER): Payer: Medicare Other | Admitting: Adult Health

## 2011-10-24 ENCOUNTER — Encounter: Payer: Self-pay | Admitting: Adult Health

## 2011-10-24 VITALS — BP 131/80 | HR 80 | Resp 18 | Ht 63.0 in | Wt 118.0 lb

## 2011-10-24 DIAGNOSIS — R079 Chest pain, unspecified: Secondary | ICD-10-CM

## 2011-10-24 DIAGNOSIS — R002 Palpitations: Secondary | ICD-10-CM

## 2011-10-24 DIAGNOSIS — Z8679 Personal history of other diseases of the circulatory system: Secondary | ICD-10-CM

## 2011-10-24 MED ORDER — METOPROLOL TARTRATE 25 MG PO TABS: 12.5000 mg | ORAL_TABLET | Freq: Two times a day (BID) | ORAL | Status: DC

## 2011-10-24 NOTE — Progress Notes (Signed)
HPI: [name] is a 60 y/o patient of [name] we are following for ongoing assessment and treatment of palpitations and chest pain with multiple medical problems to include chronic musculoskeletal pain and anxiety. She is a chronic smoker.  She has had a stress test in April 2011 that was normal. She comes today complaining of an episode of severe chest pain and pressure with palpitations while at rest making jewelry.  She felt palpitations as well. She planned to call EMS after she secured her pets, gathered her medications, and called her neighbor. Once all of that was completed, the pain had subsided. She knew she had an appointment today with cardiology so she did not seek medical attention. She has been seen by Dr. Lodema Hong this month and has been ordered a CT of the chest to evaluate her chronic pain. She does not know who prescribes her medications or changes them, as I reviewed them and found them to be different from last list in our office. She says that she cannot keep up with all of the doctors she sees. She has recently been placed on Plavix for an unknown reason, she is followed by Dr. Judithann Sheen for chronic dizziness and neurologic pain.   Allergies  Allergen Reactions  . Aspirin Hives and Itching  . Tramadol Hcl Other (See Comments)    Lowers BP  . Willow Bark ([place]) Hives, Itching and Swelling    Requires EPI PEN. Swelling of throat, tongue.   Baruch Merl Leaf Swallow Wort Rhizome Hives, Itching and Swelling    Requires EPI PEN, Welling of throat, tongue.  . Benadryl (Altaryl) Itching    Makes patient fell hyper.  . Codeine     Patient also states she is allergic to steroids.    Current Outpatient Prescriptions  Medication Sig Dispense Refill  . amLODipine (NORVASC) 5 MG tablet Take 5 mg by mouth daily.        . calcium carbonate (OS-CAL) 600 MG TABS Take 600 mg by mouth daily.        . cetirizine (ZYRTEC) 10 MG tablet Take 10 mg by mouth daily. As  Needed for  hives       . clopidogrel (PLAVIX) 75 MG tablet Take 75 mg by mouth daily. One half daily per patient       . EPINEPHrine (EPIPEN 2-PAK) 0.3 MG/0.3ML DEVI Inject 0.3 mg into the muscle once. For an acute severe allergic reaction       . fentaNYL (DURAGESIC - DOSED MCG/HR) 12 MCG/HR Place 1 patch onto the skin every third day. Apply 1 patch every 3 days and remove the old patch       . HYDROcodone-acetaminophen (LORTAB) 7.5-500 MG per tablet Take 1 tablet by mouth every 6 (six) hours as needed.        . neomycin-bacitracin-polymyxin (POLYSPORIN) ophthalmic ointment Place into both eyes as needed.        Marland Kitchen ZETIA 10 MG tablet TAKE ONE TABLET BY MOUTH AT BEDTIME.  30 each  3  . ZOCOR 40 MG tablet TAKE 1 TABLET BY MOUTH ONCE DAILY.  30 each  3  . metoprolol tartrate (LOPRESSOR) 25 MG tablet Take 0.5 tablets (12.5 mg total) by mouth 2 (two) times daily.  90 tablet  3    Past Medical History  Diagnosis Date  . Hepatitis C 1993     Needs Hepatic panel every 6   months, traeted for  1 year   . Hyperlipidemia  2001  . HEARING LOSS     since age 8  . Hypertension 2001  . Sleep apnea 2001    non compliant wit the use of the machine  . Tinnitus 2006    disabling  . Fracture 2006    left foot & ankle , immobilized for healing   . Diverticulosis 2008    diagnosed   . Panic disorder     was followed by mental health  . Diverticulitis   . Hiatal hernia     Per medical history form dated 05/02/11. Repaired.  . Night sweats     Per medical history form dated 05/02/11.  Marland Kitchen SOB (shortness of breath)     Per medical history form dated 05/02/11.  . Wears glasses   . Arthritis     Per medical history form dated 05/02/11.  . Menopause     per medical history form  . Generalized headaches     Patient stated they are felt in back of the head, not throbing. But always in same spot. MRI's done, no reason why they occur.  . Gout     Recently diagnosed.  . Wears dentures     Per medical history form dated  05/02/11.    Past Surgical History  Procedure Date  . Tonsilllectomy 1955  . Vocal cord biopsy 2009    pt reports she had voice loss, reports that she had precancerous lesions on the throat   . Hiatal hernia repair 2003  . Appendectomy in teens   . Total abdominal hysterectomy w/ bilateral salpingoophorectomy March 2006    Non Cancerous   . Abdominal hernia repair x2 more   . Left elbow surgery 1999  . R ankle surgery x5 in 1993-1999  . Partial colectomy 2009    PT. REPORTS THAT SHE HAS HAD 8 INFECTIOS PREVO\IOUSLY WHICH REQUIRED SURGERY  . Umbilical hernis repair March 24,2010  . Hernia repair     incisional    VHQ:IONGEX of systems complete and found to be negative unless listed above PHYSICAL EXAM BP 131/80  Pulse 80  Resp 18  Ht 5\' 3"  (1.6 m)  Wt 118 lb (53.524 kg)  BMI 20.90 kg/m2  EKG:NSR rate of 79 bpm.   ASSESSMENT AND PLAN

## 2011-10-24 NOTE — Patient Instructions (Signed)
Your physician recommends that you schedule a follow-up appointment in: 12 months  Your physician has recommended you make the following change in your medication:  Start Metoprolol 12.5 mg twice a day

## 2011-10-24 NOTE — Progress Notes (Signed)
Addended by: Reather Laurence A on: 10/24/2011 04:33 PM   Modules accepted: Orders

## 2011-10-24 NOTE — Telephone Encounter (Signed)
Walked in (has appt at [facility] at 11 today) states that yesterday she had severe pain in her chest. Extremely severe crushing pain and very bad pressure in both upper jaw/ear area. Was confused and started putting all her dogs up because she was going to call 911. After she had notified her neighbor of her symptoms and was going to call EMS, her symptoms started to slow down and eventually went away. She decided not to go to ER. States she is still somewhat confused today and feels like her words are not coming out right.  Also on her last few discharges she has seen a CT w/ contrast in the "future orders" to be done. It was ordered in 01/29/11 and you were the authorizing provider. I spoke with [facility] about it but she didn't know if she was still supposed to get it now or not. It was to eval a lump in her chest.

## 2011-10-24 NOTE — Assessment & Plan Note (Signed)
Currently well controlled on medications. NO dizziness is reported at this time.

## 2011-10-24 NOTE — Assessment & Plan Note (Addendum)
Do not think there is a cardiac etiology for this pain. She admits to smoking prior to the episode, and having chronic palpitations.  EKG is normal, last stress test is normal. Will add back low dose metoprolol 12.5 mg for palpitations. Uncertain why she is on plavix. She has a history of TIA's in the past. This may have been started by neurologist as we have not placed her on it.  She does not remember to put her on the meds.She is not on this for any cardiac reasons.

## 2011-10-27 ENCOUNTER — Ambulatory Visit (HOSPITAL_COMMUNITY): Payer: Medicare Other | Admitting: Oncology

## 2011-10-27 ENCOUNTER — Other Ambulatory Visit: Payer: Self-pay | Admitting: Family Medicine

## 2011-10-27 DIAGNOSIS — F17200 Nicotine dependence, unspecified, uncomplicated: Secondary | ICD-10-CM

## 2011-10-27 DIAGNOSIS — R222 Localized swelling, mass and lump, trunk: Secondary | ICD-10-CM

## 2011-10-27 NOTE — Telephone Encounter (Signed)
pls order chest ct without contrast to evaluate lump on chest also continued nicotine use to evaluate for mass in lungs, let her know test is being re ordered and why also please

## 2011-10-29 ENCOUNTER — Encounter (HOSPITAL_COMMUNITY): Payer: Medicare Other | Attending: Oncology | Admitting: Oncology

## 2011-10-29 VITALS — BP 132/66 | HR 68 | Temp 99.3°F | Ht 62.5 in | Wt 122.8 lb

## 2011-10-29 DIAGNOSIS — I10 Essential (primary) hypertension: Secondary | ICD-10-CM | POA: Insufficient documentation

## 2011-10-29 DIAGNOSIS — F17200 Nicotine dependence, unspecified, uncomplicated: Secondary | ICD-10-CM | POA: Insufficient documentation

## 2011-10-29 DIAGNOSIS — J984 Other disorders of lung: Secondary | ICD-10-CM

## 2011-10-29 DIAGNOSIS — G4730 Sleep apnea, unspecified: Secondary | ICD-10-CM | POA: Insufficient documentation

## 2011-10-29 DIAGNOSIS — B1920 Unspecified viral hepatitis C without hepatic coma: Secondary | ICD-10-CM | POA: Insufficient documentation

## 2011-10-29 DIAGNOSIS — D72829 Elevated white blood cell count, unspecified: Secondary | ICD-10-CM | POA: Insufficient documentation

## 2011-10-29 DIAGNOSIS — D596 Hemoglobinuria due to hemolysis from other external causes: Secondary | ICD-10-CM | POA: Insufficient documentation

## 2011-10-29 LAB — DIFFERENTIAL
Basophils Relative: 0 % (ref 0–1)
Eosinophils Absolute: 0.3 10*3/uL (ref 0.0–0.7)
Eosinophils Relative: 2 % (ref 0–5)
Monocytes Absolute: 1 10*3/uL (ref 0.1–1.0)
Monocytes Relative: 8 % (ref 3–12)

## 2011-10-29 LAB — CBC
HCT: 45.9 % (ref 36.0–46.0)
Hemoglobin: 15.1 g/dL — ABNORMAL HIGH (ref 12.0–15.0)
MCH: 31.3 pg (ref 26.0–34.0)
MCHC: 32.9 g/dL (ref 30.0–36.0)
RDW: 13.8 % (ref 11.5–15.5)
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
## 2011-10-29 NOTE — Patient Instructions (Signed)
[name]  [id number] [birth year]   [facility]  Discharge Instructions  RECOMMENDATIONS MADE BY THE CONSULTANT AND ANY TEST RESULTS WILL BE SENT TO YOUR REFERRING DOCTOR.   EXAM FINDINGS BY MD TODAY AND SIGNS AND SYMPTOMS TO REPORT TO CLINIC OR PRIMARY MD: We will just watch you for now.  If any of your labs are abnormal we will contact you.  MEDICATIONS PRESCRIBED: none   INSTRUCTIONS GIVEN AND DISCUSSED: Other :  Report frequent or recurring infections  SPECIAL INSTRUCTIONS/FOLLOW-UP: Lab work Needed in 6 months and Return to Clinic to see Dr. Mariel Sleet after results are back.   I acknowledge that I have been informed and understand all the instructions given to me and received a copy. I do not have any more questions at this time, but understand that I may call the Specialty Clinic at [facility] at [phone] during business hours should I have any further questions or need assistance in obtaining follow-up care.    __________________________________________  _____________  __________ Signature of Patient or Authorized Representative            Date                   Time    __________________________________________ Nurse's Signature

## 2011-10-29 NOTE — Telephone Encounter (Signed)
Pt has appt at [facility] for 11/07/2011. luann made appt for pt and called her with it

## 2011-10-30 ENCOUNTER — Ambulatory Visit (HOSPITAL_COMMUNITY): Payer: Medicare Other | Admitting: Oncology

## 2011-11-07 ENCOUNTER — Ambulatory Visit (HOSPITAL_COMMUNITY)
Admission: RE | Admit: 2011-11-07 | Discharge: 2011-11-07 | Disposition: A | Discharge status: Home / Self Care | Payer: Medicare Other | Source: Ambulatory Visit | Attending: Family Medicine | Admitting: Family Medicine

## 2011-11-07 ENCOUNTER — Encounter (HOSPITAL_COMMUNITY): Payer: Self-pay

## 2011-11-07 DIAGNOSIS — F17200 Nicotine dependence, unspecified, uncomplicated: Secondary | ICD-10-CM

## 2011-11-07 DIAGNOSIS — R222 Localized swelling, mass and lump, trunk: Secondary | ICD-10-CM | POA: Insufficient documentation

## 2011-11-12 ENCOUNTER — Telehealth: Payer: Self-pay | Admitting: Family Medicine

## 2011-11-12 NOTE — Telephone Encounter (Signed)
Appointment 1.21.2013 @ 2:45 with this ENT patient is aware

## 2011-11-17 DIAGNOSIS — R269 Unspecified abnormalities of gait and mobility: Secondary | ICD-10-CM | POA: Diagnosis not present

## 2011-11-17 DIAGNOSIS — Z79899 Other long term (current) drug therapy: Secondary | ICD-10-CM | POA: Diagnosis not present

## 2011-11-17 DIAGNOSIS — G8929 Other chronic pain: Secondary | ICD-10-CM | POA: Diagnosis not present

## 2011-11-17 DIAGNOSIS — I10 Essential (primary) hypertension: Secondary | ICD-10-CM | POA: Diagnosis not present

## 2011-12-10 ENCOUNTER — Ambulatory Visit (HOSPITAL_COMMUNITY)
Admission: RE | Admit: 2011-12-10 | Discharge: 2011-12-10 | Disposition: A | Discharge status: Home / Self Care | Payer: Medicare Other | Source: Ambulatory Visit | Attending: Family Medicine | Admitting: Family Medicine

## 2011-12-10 ENCOUNTER — Other Ambulatory Visit: Payer: Self-pay | Admitting: Family Medicine

## 2011-12-10 DIAGNOSIS — R928 Other abnormal and inconclusive findings on diagnostic imaging of breast: Secondary | ICD-10-CM | POA: Diagnosis not present

## 2011-12-12 ENCOUNTER — Other Ambulatory Visit: Payer: Self-pay | Admitting: Family Medicine

## 2012-01-14 DIAGNOSIS — G8929 Other chronic pain: Secondary | ICD-10-CM | POA: Diagnosis not present

## 2012-01-14 DIAGNOSIS — R269 Unspecified abnormalities of gait and mobility: Secondary | ICD-10-CM | POA: Diagnosis not present

## 2012-01-14 DIAGNOSIS — M79609 Pain in unspecified limb: Secondary | ICD-10-CM | POA: Diagnosis not present

## 2012-01-14 DIAGNOSIS — M62838 Other muscle spasm: Secondary | ICD-10-CM | POA: Diagnosis not present

## 2012-01-14 DIAGNOSIS — M1990 Unspecified osteoarthritis, unspecified site: Secondary | ICD-10-CM | POA: Diagnosis not present

## 2012-01-14 DIAGNOSIS — Z79899 Other long term (current) drug therapy: Secondary | ICD-10-CM | POA: Diagnosis not present

## 2012-01-27 ENCOUNTER — Other Ambulatory Visit: Payer: Self-pay

## 2012-01-27 ENCOUNTER — Emergency Department (HOSPITAL_COMMUNITY): Payer: Medicare Other

## 2012-01-27 ENCOUNTER — Encounter (HOSPITAL_COMMUNITY): Payer: Self-pay

## 2012-01-27 ENCOUNTER — Inpatient Hospital Stay (HOSPITAL_COMMUNITY)
Admission: EM | Admit: 2012-01-27 | Discharge: 2012-01-29 | DRG: 378 | Disposition: A | Discharge status: Home / Self Care | Payer: Medicare Other | Attending: Internal Medicine | Admitting: Internal Medicine

## 2012-01-27 DIAGNOSIS — R5381 Other malaise: Secondary | ICD-10-CM

## 2012-01-27 DIAGNOSIS — M109 Gout, unspecified: Secondary | ICD-10-CM | POA: Diagnosis present

## 2012-01-27 DIAGNOSIS — M2550 Pain in unspecified joint: Secondary | ICD-10-CM

## 2012-01-27 DIAGNOSIS — I7411 Embolism and thrombosis of thoracic aorta: Secondary | ICD-10-CM | POA: Diagnosis present

## 2012-01-27 DIAGNOSIS — E7800 Pure hypercholesterolemia, unspecified: Secondary | ICD-10-CM | POA: Diagnosis present

## 2012-01-27 DIAGNOSIS — J45909 Unspecified asthma, uncomplicated: Secondary | ICD-10-CM

## 2012-01-27 DIAGNOSIS — M19079 Primary osteoarthritis, unspecified ankle and foot: Secondary | ICD-10-CM

## 2012-01-27 DIAGNOSIS — R928 Other abnormal and inconclusive findings on diagnostic imaging of breast: Secondary | ICD-10-CM

## 2012-01-27 DIAGNOSIS — G4730 Sleep apnea, unspecified: Secondary | ICD-10-CM

## 2012-01-27 DIAGNOSIS — B1710 Acute hepatitis C without hepatic coma: Secondary | ICD-10-CM | POA: Diagnosis present

## 2012-01-27 DIAGNOSIS — B1920 Unspecified viral hepatitis C without hepatic coma: Secondary | ICD-10-CM | POA: Diagnosis present

## 2012-01-27 DIAGNOSIS — K921 Melena: Secondary | ICD-10-CM | POA: Diagnosis present

## 2012-01-27 DIAGNOSIS — M542 Cervicalgia: Secondary | ICD-10-CM

## 2012-01-27 DIAGNOSIS — K5790 Diverticulosis of intestine, part unspecified, without perforation or abscess without bleeding: Secondary | ICD-10-CM

## 2012-01-27 DIAGNOSIS — E739 Lactose intolerance, unspecified: Secondary | ICD-10-CM | POA: Diagnosis present

## 2012-01-27 DIAGNOSIS — Z87898 Personal history of other specified conditions: Secondary | ICD-10-CM

## 2012-01-27 DIAGNOSIS — R1310 Dysphagia, unspecified: Secondary | ICD-10-CM

## 2012-01-27 DIAGNOSIS — K625 Hemorrhage of anus and rectum: Secondary | ICD-10-CM | POA: Diagnosis not present

## 2012-01-27 DIAGNOSIS — Z8673 Personal history of transient ischemic attack (TIA), and cerebral infarction without residual deficits: Secondary | ICD-10-CM

## 2012-01-27 DIAGNOSIS — D62 Acute posthemorrhagic anemia: Secondary | ICD-10-CM | POA: Diagnosis present

## 2012-01-27 DIAGNOSIS — R079 Chest pain, unspecified: Secondary | ICD-10-CM

## 2012-01-27 DIAGNOSIS — R51 Headache: Secondary | ICD-10-CM

## 2012-01-27 DIAGNOSIS — K2990 Gastroduodenitis, unspecified, without bleeding: Secondary | ICD-10-CM

## 2012-01-27 DIAGNOSIS — Z8679 Personal history of other diseases of the circulatory system: Secondary | ICD-10-CM

## 2012-01-27 DIAGNOSIS — R7301 Impaired fasting glucose: Secondary | ICD-10-CM

## 2012-01-27 DIAGNOSIS — Z79899 Other long term (current) drug therapy: Secondary | ICD-10-CM

## 2012-01-27 DIAGNOSIS — Z780 Asymptomatic menopausal state: Secondary | ICD-10-CM

## 2012-01-27 DIAGNOSIS — K922 Gastrointestinal hemorrhage, unspecified: Secondary | ICD-10-CM | POA: Diagnosis not present

## 2012-01-27 DIAGNOSIS — Z888 Allergy status to other drugs, medicaments and biological substances status: Secondary | ICD-10-CM

## 2012-01-27 DIAGNOSIS — G459 Transient cerebral ischemic attack, unspecified: Secondary | ICD-10-CM

## 2012-01-27 DIAGNOSIS — F1011 Alcohol abuse, in remission: Secondary | ICD-10-CM

## 2012-01-27 DIAGNOSIS — I7410 Embolism and thrombosis of unspecified parts of aorta: Secondary | ICD-10-CM | POA: Diagnosis present

## 2012-01-27 DIAGNOSIS — L259 Unspecified contact dermatitis, unspecified cause: Secondary | ICD-10-CM

## 2012-01-27 DIAGNOSIS — Z7902 Long term (current) use of antithrombotics/antiplatelets: Secondary | ICD-10-CM

## 2012-01-27 DIAGNOSIS — IMO0001 Reserved for inherently not codable concepts without codable children: Secondary | ICD-10-CM

## 2012-01-27 DIAGNOSIS — F411 Generalized anxiety disorder: Secondary | ICD-10-CM

## 2012-01-27 DIAGNOSIS — M79609 Pain in unspecified limb: Secondary | ICD-10-CM

## 2012-01-27 DIAGNOSIS — I10 Essential (primary) hypertension: Secondary | ICD-10-CM | POA: Diagnosis present

## 2012-01-27 DIAGNOSIS — D649 Anemia, unspecified: Secondary | ICD-10-CM

## 2012-01-27 DIAGNOSIS — I519 Heart disease, unspecified: Secondary | ICD-10-CM | POA: Diagnosis present

## 2012-01-27 DIAGNOSIS — K5730 Diverticulosis of large intestine without perforation or abscess without bleeding: Secondary | ICD-10-CM | POA: Diagnosis present

## 2012-01-27 DIAGNOSIS — H9190 Unspecified hearing loss, unspecified ear: Secondary | ICD-10-CM | POA: Diagnosis present

## 2012-01-27 DIAGNOSIS — F17200 Nicotine dependence, unspecified, uncomplicated: Secondary | ICD-10-CM | POA: Diagnosis present

## 2012-01-27 DIAGNOSIS — T7840XA Allergy, unspecified, initial encounter: Secondary | ICD-10-CM

## 2012-01-27 DIAGNOSIS — R002 Palpitations: Secondary | ICD-10-CM | POA: Diagnosis present

## 2012-01-27 DIAGNOSIS — R222 Localized swelling, mass and lump, trunk: Secondary | ICD-10-CM

## 2012-01-27 DIAGNOSIS — I498 Other specified cardiac arrhythmias: Secondary | ICD-10-CM | POA: Diagnosis present

## 2012-01-27 DIAGNOSIS — Z9049 Acquired absence of other specified parts of digestive tract: Secondary | ICD-10-CM

## 2012-01-27 DIAGNOSIS — D72829 Elevated white blood cell count, unspecified: Secondary | ICD-10-CM | POA: Diagnosis present

## 2012-01-27 DIAGNOSIS — R209 Unspecified disturbances of skin sensation: Secondary | ICD-10-CM

## 2012-01-27 DIAGNOSIS — L509 Urticaria, unspecified: Secondary | ICD-10-CM

## 2012-01-27 DIAGNOSIS — K219 Gastro-esophageal reflux disease without esophagitis: Secondary | ICD-10-CM | POA: Diagnosis not present

## 2012-01-27 DIAGNOSIS — D126 Benign neoplasm of colon, unspecified: Secondary | ICD-10-CM | POA: Diagnosis present

## 2012-01-27 DIAGNOSIS — M129 Arthropathy, unspecified: Secondary | ICD-10-CM

## 2012-01-27 DIAGNOSIS — K6389 Other specified diseases of intestine: Secondary | ICD-10-CM | POA: Diagnosis not present

## 2012-01-27 DIAGNOSIS — L600 Ingrowing nail: Secondary | ICD-10-CM

## 2012-01-27 DIAGNOSIS — G4700 Insomnia, unspecified: Secondary | ICD-10-CM

## 2012-01-27 DIAGNOSIS — K2970 Gastritis, unspecified, without bleeding: Secondary | ICD-10-CM

## 2012-01-27 DIAGNOSIS — R6889 Other general symptoms and signs: Secondary | ICD-10-CM | POA: Diagnosis not present

## 2012-01-27 DIAGNOSIS — E785 Hyperlipidemia, unspecified: Secondary | ICD-10-CM

## 2012-01-27 DIAGNOSIS — Z8619 Personal history of other infectious and parasitic diseases: Secondary | ICD-10-CM

## 2012-01-27 DIAGNOSIS — K449 Diaphragmatic hernia without obstruction or gangrene: Secondary | ICD-10-CM

## 2012-01-27 DIAGNOSIS — K2960 Other gastritis without bleeding: Secondary | ICD-10-CM | POA: Diagnosis present

## 2012-01-27 HISTORY — DX: Acute posthemorrhagic anemia: D62

## 2012-01-27 HISTORY — DX: Other ill-defined heart diseases: I51.89

## 2012-01-27 HISTORY — DX: Gastrointestinal hemorrhage, unspecified: K92.2

## 2012-01-27 HISTORY — DX: Embolism and thrombosis of unspecified parts of aorta: I74.10

## 2012-01-27 LAB — COMPREHENSIVE METABOLIC PANEL
Alkaline Phosphatase: 59 U/L (ref 39–117)
BUN: 24 mg/dL — ABNORMAL HIGH (ref 6–23)
CO2: 24 mEq/L (ref 19–32)
Chloride: 103 mEq/L (ref 96–112)
Creatinine, Ser: 0.65 mg/dL (ref 0.50–1.10)
GFR calc Af Amer: >90 mL/min (ref >90)
GFR calc non Af Amer: >90 mL/min (ref >90)
Glucose, Bld: 100 mg/dL — ABNORMAL HIGH (ref 70–99)
Potassium: 4.1 mEq/L (ref 3.5–5.1)
Total Bilirubin: 0.2 mg/dL — ABNORMAL LOW (ref 0.3–1.2)

## 2012-01-27 LAB — CBC
HCT: 41.8 % (ref 36.0–46.0)
HCT: 35.4 % — ABNORMAL LOW (ref 36.0–46.0)
Hemoglobin: 14.4 g/dL (ref 12.0–15.0)
MCHC: 34.4 g/dL (ref 30.0–36.0)
MCV: 93.2 fL (ref 78.0–100.0)
Platelets: 202 10*3/uL (ref 150–400)
RBC: 4.53 MIL/uL (ref 3.87–5.11)
RBC: 3.8 MIL/uL — ABNORMAL LOW (ref 3.87–5.11)
RDW: 13.7 % (ref 11.5–15.5)
WBC: 17.8 10*3/uL — ABNORMAL HIGH (ref 4.0–10.5)

## 2012-01-27 LAB — DIFFERENTIAL
Basophils Relative: 0 % (ref 0–1)
Lymphocytes Relative: 28 % (ref 12–46)
Lymphs Abs: 3.3 10*3/uL (ref 0.7–4.0)
Monocytes Absolute: 0.9 10*3/uL (ref 0.1–1.0)
Monocytes Relative: 8 % (ref 3–12)
Neutro Abs: 7.4 10*3/uL (ref 1.7–7.7)

## 2012-01-27 LAB — PROTIME-INR
INR: 0.98 (ref 0.00–1.49)
Prothrombin Time: 13.2 seconds (ref 11.6–15.2)

## 2012-01-27 LAB — LIPASE, BLOOD: Lipase: 24 U/L (ref 11–59)

## 2012-01-27 LAB — URINE MICROSCOPIC-ADD ON

## 2012-01-27 LAB — URINALYSIS, ROUTINE W REFLEX MICROSCOPIC
Bilirubin Urine: NEGATIVE
Specific Gravity, Urine: >1.03 — ABNORMAL HIGH (ref 1.005–1.030)
Urobilinogen, UA: 0.2 mg/dL (ref 0.0–1.0)
pH: 5.5 (ref 5.0–8.0)

## 2012-01-27 MED ORDER — SODIUM CHLORIDE 0.9 % IV BOLUS (SEPSIS)
1000.0000 mL | Freq: Once | INTRAVENOUS | Status: AC
  Administered 2012-01-27: 1000 mL via INTRAVENOUS

## 2012-01-27 MED ORDER — SODIUM CHLORIDE 0.9 % IV SOLN
INTRAVENOUS | Status: DC
  Administered 2012-01-27: 23:00:00 via INTRAVENOUS

## 2012-01-27 NOTE — ED Provider Notes (Addendum)
History     CSN: [id number]  Arrival date & time 01/27/12  4098   First MD Initiated Contact with Patient 01/27/12 1920      Chief Complaint  Patient presents with  . Rectal Bleeding    (Consider location/radiation/quality/duration/timing/severity/associated sxs/prior treatment) HPI Comments: Patient presents with bright red blood per rectum it started around 4:00. She says she fell as she did have a bowel movement but had a large amount of red blood on toilet bowl. This is painless. This is a total of 3 times since the onset. She denies any abdominal pain, nausea, vomiting, chest pain, shortness of breath, dizziness or lightheadedness. She is on Plavix but not Coumadin or aspirin. She has a history of diverticulosis status post partial colectomy. She states she is overdue for her repeat colonoscopy.  The history is provided by the patient.    Past Medical History  Diagnosis Date  . Hepatitis C 1993     Needs Hepatic panel every 6   months, traeted for  1 year   . Hyperlipidemia 2001  . HEARING LOSS     since age 18  . Hypertension 2001  . Sleep apnea 2001    non compliant wit the use of the machine  . Tinnitus 2006    disabling  . Fracture 2006    left foot & ankle , immobilized for healing   . Diverticulosis 2008    diagnosed   . Panic disorder     was followed by mental health  . Diverticulitis   . Hiatal hernia     Per medical history form dated 05/02/11. Repaired.  . Night sweats     Per medical history form dated 05/02/11.  Marland Kitchen SOB (shortness of breath)     Per medical history form dated 05/02/11.  . Wears glasses   . Arthritis     Per medical history form dated 05/02/11.  . Menopause     per medical history form  . Generalized headaches     Patient stated they are felt in back of the head, not throbing. But always in same spot. MRI's done, no reason why they occur.  . Gout     Recently diagnosed.  . Wears dentures     Per medical history form dated 05/02/11.     Past Surgical History  Procedure Date  . Tonsilllectomy 1955  . Vocal cord biopsy 2009    pt reports she had voice loss, reports that she had precancerous lesions on the throat   . Hiatal hernia repair 2003  . Appendectomy in teens   . Total abdominal hysterectomy w/ bilateral salpingoophorectomy March 2006    Non Cancerous   . Abdominal hernia repair x2 more   . Left elbow surgery 1999  . R ankle surgery x5 in 1993-1999  . Partial colectomy 2009    PT. REPORTS THAT SHE HAS HAD 8 INFECTIOS PREVO\IOUSLY WHICH REQUIRED SURGERY  . Umbilical hernis repair March 24,2010  . Hernia repair     incisional    Family History  Problem Relation Age of Onset  . Ovarian cancer Mother   . Cancer Mother   . Cancer      Family history of  . Arthritis      Family history of  . Heart disease      family history of    History  Substance Use Topics  . Smoking status: Current Everyday Smoker -- 2.0 packs/day  . Smokeless tobacco: Not on file  .  Alcohol Use: No     Quit on 08/07/08    OB History    Grav Para Term Preterm Abortions TAB SAB Ect Mult Living                  Review of Systems  Constitutional: Negative for fever, activity change and appetite change.  HENT: Negative for congestion and rhinorrhea.   Respiratory: Negative for cough, chest tightness and shortness of breath.   Cardiovascular: Negative for chest pain.  Gastrointestinal: Positive for blood in stool and hematochezia. Negative for nausea, vomiting and abdominal pain.  Genitourinary: Negative for dysuria, hematuria, vaginal bleeding and vaginal discharge.  Musculoskeletal: Negative for back pain.  Neurological: Negative for dizziness, weakness and headaches.    Allergies  Aspirin; Tramadol hcl; Willow bark; Willow leaf swallow wort rhizome; Benadryl; Codeine; and Prednisone  Home Medications   Current Outpatient Rx  Name Route Sig Dispense Refill  . CETIRIZINE HCL 10 MG PO TABS Oral Take 10 mg by mouth  daily. As  Needed for hives     . CLOPIDOGREL BISULFATE 75 MG PO TABS Oral Take 37.5 mg by mouth every morning. One half daily per patient    . CYCLOBENZAPRINE HCL 10 MG PO TABS Oral Take 10 mg by mouth 3 (three) times daily as needed.    Marland Kitchen DILTIAZEM HCL ER COATED BEADS 120 MG PO CP24 Oral Take 120 mg by mouth every morning. For blood pressure    . EZETIMIBE 10 MG PO TABS      . FENTANYL 12 MCG/HR TD PT72 Transdermal Place 1 patch onto the skin every third day. Apply 1 patch every 3 days and remove the old patch     . HYDROCODONE-ACETAMINOPHEN 7.5-500 MG PO TABS Oral Take 1 tablet by mouth every 6 (six) hours as needed. FOR PAIN    . METOPROLOL TARTRATE 25 MG PO TABS Oral Take 0.5 tablets (12.5 mg total) by mouth 2 (two) times daily. 90 tablet 3    FOR PALPITATIONS  . ZOCOR 40 MG PO TABS  TAKE 1 TABLET BY MOUTH ONCE DAILY. 30 each 3  . CALCIUM CARBONATE 600 MG PO TABS Oral Take 600 mg by mouth daily.      Marland Kitchen EPINEPHRINE 0.3 MG/0.3ML IJ DEVI Intramuscular Inject 0.3 mg into the muscle once. For an acute severe allergic reaction    . NEOMYCIN-BACITRACIN ZN-POLYMYX 5-400-10000 OP OINT Both Eyes Place into both eyes as needed.        BP 107/67  Pulse 104  Temp(Src) 98.6 F (37 C) (Oral)  Resp 18  Ht 5\' 3"  (1.6 m)  Wt 120 lb (54.432 kg)  BMI 21.26 kg/m2  SpO2 96%  Physical Exam  Constitutional: She is oriented to person, place, and time. She appears well-developed and well-nourished. No distress.  HENT:  Head: Normocephalic and atraumatic.  Mouth/Throat: Oropharynx is clear and moist. No oropharyngeal exudate.  Eyes: Conjunctivae are normal. Pupils are equal, round, and reactive to light.  Neck: Normal range of motion. Neck supple.  Cardiovascular: Normal rate, regular rhythm and normal heart sounds.   Pulmonary/Chest: Effort normal and breath sounds normal. No respiratory distress.  Abdominal: Soft. There is no tenderness. There is no rebound and no guarding.  Genitourinary: Guaiac  positive stool.       Bright Red blood on rectal exam. No visible fissures or hemorrhoids  Musculoskeletal: Normal range of motion. She exhibits no edema and no tenderness.  Neurological: She is alert and oriented to person,  place, and time. No cranial nerve deficit.  Skin: Skin is warm.    ED Course  Procedures (including critical care time)  Labs Reviewed  CBC - Abnormal; Notable for the following:    WBC 11.9 (*)    All other components within normal limits  COMPREHENSIVE METABOLIC PANEL - Abnormal; Notable for the following:    Glucose, Bld 100 (*)    BUN 24 (*)    Total Bilirubin 0.2 (*)    All other components within normal limits  CBC - Abnormal; Notable for the following:    WBC 17.8 (*)    RBC 3.80 (*)    HCT 35.4 (*)    All other components within normal limits  DIFFERENTIAL  PROTIME-INR  TYPE AND SCREEN  TROPONIN I  LIPASE, BLOOD  URINALYSIS, ROUTINE W REFLEX MICROSCOPIC   No results found.   1. GI bleeding   2. Anemia       MDM  Hematochezia without abdominal pain. Vital signs stable. Suspect diverticular bleed. Abdomen soft.  Labs, orthostatic vitals, IV hydration.  Hemoglobin stable at 14. Patient looks well but continues to have frequent episodes of needing to return to the bathroom to have bloody bowel movements. She states she's had 5 since she's been in the ED.  Repeat hemoglobin 12.2. Orthostatic by heart rate.  CT ordered and care signed out to Dr. Adriana Simas at change of shift.  D/w Dr. Jena Gauss.  Aware of patient and will see in the morning. Admission to stepdown discussed with Dr. Rito Ehrlich.   Date: 01/27/2012  Rate: 105  Rhythm: sinus tachycardia  QRS Axis: normal  Intervals: normal  ST/T Wave abnormalities: normal  Conduction Disutrbances:none  Narrative Interpretation:   Old EKG Reviewed: none available  CRITICAL CARE Performed by: Glynn Octave   Total critical care time: 30  Critical care time was exclusive of separately  billable procedures and treating other patients.  Critical care was necessary to treat or prevent imminent or life-threatening deterioration.  Critical care was time spent personally by me on the following activities: development of treatment plan with patient and/or surrogate as well as nursing, discussions with consultants, evaluation of patient's response to treatment, examination of patient, obtaining history from patient or surrogate, ordering and performing treatments and interventions, ordering and review of laboratory studies, ordering and review of radiographic studies, pulse oximetry and re-evaluation of patient's condition.      Glynn Octave, MD 01/27/12 [id number]  Glynn Octave, MD 01/27/12 2232

## 2012-01-27 NOTE — ED Notes (Signed)
Pt c/o 2 bright red rectal bleeding episodes in past 45 minutes, denies pain.

## 2012-01-27 NOTE — H&P (Addendum)
[name] is an 61 y.o. female.    PCP: Syliva Overman, MD, MD   Chief Complaint: Blood per rectum since 6 PM  HPI: This is a 61 year old, Caucasian female, with a past medical history of hypertension, hypercholesterolemia, palpitations, hepatitis C, who was in her usual state of health till 6 PM when she felt like she had to go urinate. Subsequently, she felt like she had to pass gas, however, then she started having profuse rectal bleeding. She's had about 9 episodes of the same since 6 PM. Over the last hour or so it feels like the bleeding is slowing down. She has passed clots of blood. Denies any abdominal pain, per se denies any nausea, vomiting. She has been feeling weak in the last couple of hours and has had some dizziness. She, said she's had a colonoscopy within the last 4 years, but does not know what it showed. Denies any fever or chills. Has a history of surgery for diverticular disease.   Home Medications: Prior to Admission medications   Medication Sig Start Date End Date Taking? Authorizing Provider  cetirizine (ZYRTEC) 10 MG tablet Take 10 mg by mouth daily. As  Needed for hives    Yes Historical Provider, MD  clopidogrel (PLAVIX) 75 MG tablet Take 37.5 mg by mouth every morning. One half daily per patient   Yes Historical Provider, MD  cyclobenzaprine (FLEXERIL) 10 MG tablet Take 10 mg by mouth 3 (three) times daily as needed.   Yes Historical Provider, MD  diltiazem (CARDIZEM CD) 120 MG 24 hr capsule Take 120 mg by mouth every morning. For blood pressure   Yes Historical Provider, MD  ezetimibe (ZETIA) 10 MG tablet  12/12/11  Yes [name], MD  fentaNYL (DURAGESIC - DOSED MCG/HR) 12 MCG/HR Place 1 patch onto the skin every third day. Apply 1 patch every 3 days and remove the old patch    Yes Historical Provider, MD  HYDROcodone-acetaminophen (LORTAB) 7.5-500 MG per tablet Take 1 tablet by mouth every 6 (six) hours as needed. FOR PAIN   Yes Historical Provider,  MD  metoprolol tartrate (LOPRESSOR) 25 MG tablet Take 0.5 tablets (12.5 mg total) by mouth 2 (two) times daily. 10/24/11 10/23/12 Yes Jodelle Gross, NP  ZOCOR 40 MG tablet TAKE 1 TABLET BY MOUTH ONCE DAILY. 12/10/11  Yes [name], MD  calcium carbonate (OS-CAL) 600 MG TABS Take 600 mg by mouth daily.      Historical Provider, MD  EPINEPHrine (EPIPEN 2-PAK) 0.3 MG/0.3ML DEVI Inject 0.3 mg into the muscle once. For an acute severe allergic reaction    Historical Provider, MD  neomycin-bacitracin-polymyxin (POLYSPORIN) ophthalmic ointment Place into both eyes as needed.      Historical Provider, MD    Allergies:  Allergies  Allergen Reactions  . Aspirin Hives and Itching  . Tramadol Hcl Other (See Comments)    Lowers BP  . Willow Bark ([place]) Hives, Itching and Swelling    Requires EPI PEN. Swelling of throat, tongue.   Baruch Merl Leaf Swallow Wort Rhizome Hives, Itching and Swelling    Requires EPI PEN, Welling of throat, tongue.  . Benadryl (Altaryl) Itching    Makes patient fell hyper.  . Codeine     Patient also states she is allergic to steroids.  . Prednisone     All steroids    Past Medical History: Past Medical History  Diagnosis Date  . Hepatitis C 1993  Needs Hepatic panel every 6   months, traeted for  1 year   . Hyperlipidemia 2001  . HEARING LOSS     since age 29  . Hypertension 2001  . Sleep apnea 2001    non compliant wit the use of the machine  . Tinnitus 2006    disabling  . Fracture 2006    left foot & ankle , immobilized for healing   . Diverticulosis 2008    diagnosed   . Panic disorder     was followed by mental health  . Diverticulitis   . Hiatal hernia     Per medical history form dated 05/02/11. Repaired.  . Night sweats     Per medical history form dated 05/02/11.  Marland Kitchen SOB (shortness of breath)     Per medical history form dated 05/02/11.  . Wears glasses   . Arthritis     Per medical history form dated 05/02/11.  .  Menopause     per medical history form  . Generalized headaches     Patient stated they are felt in back of the head, not throbing. But always in same spot. MRI's done, no reason why they occur.  . Gout     Recently diagnosed.  . Wears dentures     Per medical history form dated 05/02/11.    Past Surgical History  Procedure Date  . Tonsilllectomy 1955  . Vocal cord biopsy 2009    pt reports she had voice loss, reports that she had precancerous lesions on the throat   . Hiatal hernia repair 2003  . Appendectomy in teens   . Total abdominal hysterectomy w/ bilateral salpingoophorectomy March 2006    Non Cancerous   . Abdominal hernia repair x2 more   . Left elbow surgery 1999  . R ankle surgery x5 in 1993-1999  . Partial colectomy 2009    PT. REPORTS THAT SHE HAS HAD 8 INFECTIOS PREVO\IOUSLY WHICH REQUIRED SURGERY  . Umbilical hernis repair March 24,2010  . Hernia repair     incisional    Social History:  reports that she has been smoking.  She does not have any smokeless tobacco history on file. She reports that she does not drink alcohol or use illicit drugs.  Family History:  Family History  Problem Relation Age of Onset  . Ovarian cancer Mother   . Cancer Mother   . Cancer      Family history of  . Arthritis      Family history of  . Heart disease      family history of    Review of Systems - History obtained from the patient General ROS: positive for  - fatigue Psychological ROS: negative Ophthalmic ROS: negative ENT ROS: negative Allergy and Immunology ROS: negative Hematological and Lymphatic ROS: negative Endocrine ROS: positive for - bleeding Respiratory ROS: negative Cardiovascular ROS: negative Gastrointestinal ROS: negative Genito-Urinary ROS: negative Musculoskeletal ROS: negative Neurological ROS: negative Dermatological ROS: negative  Physical Examination Blood pressure 107/67, pulse 104, temperature 98.6 F (37 C), temperature source Oral,  resp. rate 18, height 5\' 3"  (1.6 m), weight 54.432 kg (120 lb), SpO2 96.00%.  General appearance: alert, cooperative, appears stated age and no distress Head: Normocephalic, without obvious abnormality, atraumatic Eyes: conjunctivae/corneas clear. PERRL, EOM's intact.  Throat: lips, mucosa, and tongue normal; teeth and gums normal Neck: no adenopathy, no carotid bruit, no JVD, supple, symmetrical, trachea midline and thyroid not enlarged, symmetric, no tenderness/mass/nodules Resp: clear to auscultation bilaterally  Cardio: regular rate and rhythm, S1, S2 normal, no murmur, click, rub or gallop GI: soft, mildly tender on the left, without rebound or rigidity; bowel sounds normal; no masses,  no organomegaly Extremities: extremities normal, atraumatic, no cyanosis or edema Pulses: 2+ and symmetric Skin: Skin color, texture, turgor normal. No rashes or lesions Lymph nodes: Cervical, supraclavicular, and axillary nodes normal. Neurologic: Grossly normal  Laboratory Data: Results for orders placed during the hospital encounter of 01/27/12 (from the past 48 hour(s))  CBC     Status: Abnormal   Collection Time   01/27/12  6:50 PM      Component Value Range Comment   WBC 11.9 (*) 4.0 - 10.5 (K/uL)    RBC 4.53  3.87 - 5.11 (MIL/uL)    Hemoglobin 14.4  12.0 - 15.0 (g/dL)    HCT 16.1  09.6 - 04.5 (%)    MCV 92.3  78.0 - 100.0 (fL)    MCH 31.8  26.0 - 34.0 (pg)    MCHC 34.4  30.0 - 36.0 (g/dL)    RDW 40.9  81.1 - 91.4 (%)    Platelets 210  150 - 400 (K/uL)   DIFFERENTIAL     Status: Normal   Collection Time   01/27/12  6:50 PM      Component Value Range Comment   Neutrophils Relative 62  43 - 77 (%)    Neutro Abs 7.4  1.7 - 7.7 (K/uL)    Lymphocytes Relative 28  12 - 46 (%)    Lymphs Abs 3.3  0.7 - 4.0 (K/uL)    Monocytes Relative 8  3 - 12 (%)    Monocytes Absolute 0.9  0.1 - 1.0 (K/uL)    Eosinophils Relative 2  0 - 5 (%)    Eosinophils Absolute 0.2  0.0 - 0.7 (K/uL)    Basophils  Relative 0  0 - 1 (%)    Basophils Absolute 0.0  0.0 - 0.1 (K/uL)   COMPREHENSIVE METABOLIC PANEL     Status: Abnormal   Collection Time   01/27/12  6:50 PM      Component Value Range Comment   Sodium 138  135 - 145 (mEq/L)    Potassium 4.1  3.5 - 5.1 (mEq/L)    Chloride 103  96 - 112 (mEq/L)    CO2 24  19 - 32 (mEq/L)    Glucose, Bld 100 (*) 70 - 99 (mg/dL)    BUN 24 (*) 6 - 23 (mg/dL)    Creatinine, Ser 7.82  0.50 - 1.10 (mg/dL)    Calcium 9.4  8.4 - 10.5 (mg/dL)    Total Protein 6.9  6.0 - 8.3 (g/dL)    Albumin 3.7  3.5 - 5.2 (g/dL)    AST 16  0 - 37 (U/L)    ALT 14  0 - 35 (U/L)    Alkaline Phosphatase 59  39 - 117 (U/L)    Total Bilirubin 0.2 (*) 0.3 - 1.2 (mg/dL)    GFR calc non Af Amer >90  >90 (mL/min)    GFR calc Af Amer >90  >90 (mL/min)   PROTIME-INR     Status: Normal   Collection Time   01/27/12  6:50 PM      Component Value Range Comment   Prothrombin Time 13.2  11.6 - 15.2 (seconds)    INR 0.98  0.00 - 1.49    TYPE AND SCREEN     Status: Normal   Collection Time  01/27/12  6:50 PM      Component Value Range Comment   ABO/RH(D) A POS      Antibody Screen NEG      Sample Expiration 01/30/2012     TROPONIN I     Status: Normal   Collection Time   01/27/12  6:50 PM      Component Value Range Comment   Troponin I <0.30  <0.30 (ng/mL)   LIPASE, BLOOD     Status: Normal   Collection Time   01/27/12  6:50 PM      Component Value Range Comment   Lipase 24  11 - 59 (U/L)   CBC     Status: Abnormal   Collection Time   01/27/12  8:23 PM      Component Value Range Comment   WBC 17.8 (*) 4.0 - 10.5 (K/uL)    RBC 3.80 (*) 3.87 - 5.11 (MIL/uL)    Hemoglobin 12.3  12.0 - 15.0 (g/dL)    HCT 40.9 (*) 81.1 - 46.0 (%)    MCV 93.2  78.0 - 100.0 (fL)    MCH 32.4  26.0 - 34.0 (pg)    MCHC 34.7  30.0 - 36.0 (g/dL)    RDW 91.4  78.2 - 95.6 (%)    Platelets 202  150 - 400 (K/uL)   URINALYSIS, ROUTINE W REFLEX MICROSCOPIC     Status: Abnormal   Collection Time   01/27/12  10:26 PM      Component Value Range Comment   Color, Urine YELLOW  YELLOW     APPearance CLEAR  CLEAR     Specific Gravity, Urine >1.030 (*) 1.005 - 1.030     pH 5.5  5.0 - 8.0     Glucose, UA NEGATIVE  NEGATIVE (mg/dL)    Hgb urine dipstick TRACE (*) NEGATIVE     Bilirubin Urine NEGATIVE  NEGATIVE     Ketones, ur NEGATIVE  NEGATIVE (mg/dL)    Protein, ur NEGATIVE  NEGATIVE (mg/dL)    Urobilinogen, UA 0.2  0.0 - 1.0 (mg/dL)    Nitrite NEGATIVE  NEGATIVE     Leukocytes, UA NEGATIVE  NEGATIVE    URINE MICROSCOPIC-ADD ON     Status: Abnormal   Collection Time   01/27/12 10:26 PM      Component Value Range Comment   Squamous Epithelial / LPF FEW (*) RARE     WBC, UA 3-6  <3 (WBC/hpf)    Bacteria, UA RARE  RARE     Urine-Other MUCOUS PRESENT       Radiology Reports: No results found.  Assessment/Plan  Principal Problem:  *Hematochezia Active Problems:  HEPATITIS C  NICOTINE ADDICTION  ESSENTIAL HYPERTENSION  Palpitations   #1 hematochezia: With a known history of diverticulosis this is most likely diverticular bleed. We will admit her to step down. Gastroenterology has already been contacted and they will see her in the morning. CBCs will be checked every 6 hours. She'll be transfused as needed. If the bleeding becomes severe we may need to consult surgery. Colonoscopy report from September of 2009, showed severe sigmoid colon diverticulosis with moderate descending colon, diverticulosis. Small internal hemorrhoids were also noted.  #2 history of hypertension, stable. Continue to monitor. We will hold her diltiazem and continue with the metoprolol withholding parameters.  #3 history of palpitations. Stable. She is followed by cardiology as an outpatient. Apparently, she had a stress test in April of 2011, which was low risk.  #4 Tobacco abuse:  nicotine patch  DVT, prophylaxis with SCDs. We'll hold her Plavix.  Reason for the Plavix is not clearly known. Apparently, it was  started in the last 4-5 months.  Further management decisions will depend on results of further testing and patient's response to treatment.  [facility]  [facility] Pager [phone]  01/27/2012, 11:26 PM

## 2012-01-27 NOTE — ED Notes (Signed)
Patient states that she has had two bowel movements with blood since the last time i talked with her at about 2200. States that one was dark red, and the next was back to bright red blood.

## 2012-01-27 NOTE — ED Notes (Signed)
Patient has went to restroom a total of 8 times now to have bowel movement, bright red thin blood seen in toilet. Patient stated after last bowel movement that she feels weak, dizzy, and lightheaded and doesn't feel good.

## 2012-01-27 NOTE — ED Notes (Signed)
Patient has had another bowel movement, states it is bright red.

## 2012-01-28 ENCOUNTER — Encounter (HOSPITAL_COMMUNITY): Payer: Self-pay | Admitting: Intensive Care

## 2012-01-28 DIAGNOSIS — Z7902 Long term (current) use of antithrombotics/antiplatelets: Secondary | ICD-10-CM | POA: Diagnosis not present

## 2012-01-28 DIAGNOSIS — Z79899 Other long term (current) drug therapy: Secondary | ICD-10-CM | POA: Diagnosis not present

## 2012-01-28 DIAGNOSIS — D126 Benign neoplasm of colon, unspecified: Secondary | ICD-10-CM | POA: Diagnosis not present

## 2012-01-28 DIAGNOSIS — K5730 Diverticulosis of large intestine without perforation or abscess without bleeding: Secondary | ICD-10-CM | POA: Diagnosis not present

## 2012-01-28 DIAGNOSIS — K5790 Diverticulosis of intestine, part unspecified, without perforation or abscess without bleeding: Secondary | ICD-10-CM | POA: Diagnosis present

## 2012-01-28 DIAGNOSIS — K5731 Diverticulosis of large intestine without perforation or abscess with bleeding: Secondary | ICD-10-CM | POA: Diagnosis not present

## 2012-01-28 DIAGNOSIS — I7411 Embolism and thrombosis of thoracic aorta: Secondary | ICD-10-CM | POA: Diagnosis present

## 2012-01-28 DIAGNOSIS — K2970 Gastritis, unspecified, without bleeding: Secondary | ICD-10-CM | POA: Diagnosis not present

## 2012-01-28 DIAGNOSIS — Z780 Asymptomatic menopausal state: Secondary | ICD-10-CM | POA: Diagnosis not present

## 2012-01-28 DIAGNOSIS — I5189 Other ill-defined heart diseases: Secondary | ICD-10-CM

## 2012-01-28 DIAGNOSIS — I10 Essential (primary) hypertension: Secondary | ICD-10-CM | POA: Diagnosis present

## 2012-01-28 DIAGNOSIS — E7800 Pure hypercholesterolemia, unspecified: Secondary | ICD-10-CM | POA: Diagnosis present

## 2012-01-28 DIAGNOSIS — R002 Palpitations: Secondary | ICD-10-CM | POA: Diagnosis present

## 2012-01-28 DIAGNOSIS — M129 Arthropathy, unspecified: Secondary | ICD-10-CM | POA: Diagnosis present

## 2012-01-28 DIAGNOSIS — H9190 Unspecified hearing loss, unspecified ear: Secondary | ICD-10-CM | POA: Diagnosis present

## 2012-01-28 DIAGNOSIS — K449 Diaphragmatic hernia without obstruction or gangrene: Secondary | ICD-10-CM | POA: Diagnosis present

## 2012-01-28 DIAGNOSIS — I059 Rheumatic mitral valve disease, unspecified: Secondary | ICD-10-CM | POA: Diagnosis not present

## 2012-01-28 DIAGNOSIS — D72829 Elevated white blood cell count, unspecified: Secondary | ICD-10-CM | POA: Diagnosis present

## 2012-01-28 DIAGNOSIS — B1920 Unspecified viral hepatitis C without hepatic coma: Secondary | ICD-10-CM | POA: Diagnosis present

## 2012-01-28 DIAGNOSIS — K922 Gastrointestinal hemorrhage, unspecified: Secondary | ICD-10-CM

## 2012-01-28 DIAGNOSIS — I498 Other specified cardiac arrhythmias: Secondary | ICD-10-CM | POA: Diagnosis present

## 2012-01-28 DIAGNOSIS — M109 Gout, unspecified: Secondary | ICD-10-CM | POA: Diagnosis present

## 2012-01-28 DIAGNOSIS — Z8673 Personal history of transient ischemic attack (TIA), and cerebral infarction without residual deficits: Secondary | ICD-10-CM | POA: Diagnosis not present

## 2012-01-28 DIAGNOSIS — D62 Acute posthemorrhagic anemia: Secondary | ICD-10-CM

## 2012-01-28 DIAGNOSIS — R112 Nausea with vomiting, unspecified: Secondary | ICD-10-CM | POA: Diagnosis not present

## 2012-01-28 DIAGNOSIS — Z9049 Acquired absence of other specified parts of digestive tract: Secondary | ICD-10-CM | POA: Diagnosis not present

## 2012-01-28 DIAGNOSIS — K625 Hemorrhage of anus and rectum: Secondary | ICD-10-CM | POA: Diagnosis not present

## 2012-01-28 DIAGNOSIS — K2960 Other gastritis without bleeding: Secondary | ICD-10-CM | POA: Diagnosis present

## 2012-01-28 DIAGNOSIS — I7410 Embolism and thrombosis of unspecified parts of aorta: Secondary | ICD-10-CM | POA: Diagnosis present

## 2012-01-28 DIAGNOSIS — I519 Heart disease, unspecified: Secondary | ICD-10-CM | POA: Diagnosis present

## 2012-01-28 DIAGNOSIS — K921 Melena: Secondary | ICD-10-CM | POA: Diagnosis not present

## 2012-01-28 DIAGNOSIS — F17200 Nicotine dependence, unspecified, uncomplicated: Secondary | ICD-10-CM | POA: Diagnosis not present

## 2012-01-28 DIAGNOSIS — K2900 Acute gastritis without bleeding: Secondary | ICD-10-CM | POA: Diagnosis not present

## 2012-01-28 DIAGNOSIS — Z888 Allergy status to other drugs, medicaments and biological substances status: Secondary | ICD-10-CM | POA: Diagnosis not present

## 2012-01-28 HISTORY — DX: Acute posthemorrhagic anemia: D62

## 2012-01-28 HISTORY — DX: Embolism and thrombosis of unspecified parts of aorta: I74.10

## 2012-01-28 HISTORY — DX: Gastrointestinal hemorrhage, unspecified: K92.2

## 2012-01-28 HISTORY — DX: Other ill-defined heart diseases: I51.89

## 2012-01-28 LAB — CBC
HCT: 32.6 % — ABNORMAL LOW (ref 36.0–46.0)
HCT: 29.1 % — ABNORMAL LOW (ref 36.0–46.0)
Hemoglobin: 11.2 g/dL — ABNORMAL LOW (ref 12.0–15.0)
Hemoglobin: 11.2 g/dL — ABNORMAL LOW (ref 12.0–15.0)
Hemoglobin: 9.9 g/dL — ABNORMAL LOW (ref 12.0–15.0)
Hemoglobin: 10.4 g/dL — ABNORMAL LOW (ref 12.0–15.0)
MCH: 31.6 pg (ref 26.0–34.0)
MCH: 31.4 pg (ref 26.0–34.0)
MCHC: 34 g/dL (ref 30.0–36.0)
MCHC: 33.8 g/dL (ref 30.0–36.0)
MCV: 92.7 fL (ref 78.0–100.0)
MCV: 92.7 fL (ref 78.0–100.0)
MCV: 93.1 fL (ref 78.0–100.0)
RBC: 3.52 MIL/uL — ABNORMAL LOW (ref 3.87–5.11)
RBC: 3.54 MIL/uL — ABNORMAL LOW (ref 3.87–5.11)
RDW: 13.8 % (ref 11.5–15.5)
WBC: 13 10*3/uL — ABNORMAL HIGH (ref 4.0–10.5)
WBC: 7.6 10*3/uL (ref 4.0–10.5)

## 2012-01-28 LAB — COMPREHENSIVE METABOLIC PANEL
ALT: 10 U/L (ref 0–35)
Calcium: 8.6 mg/dL (ref 8.4–10.5)
Creatinine, Ser: 0.59 mg/dL (ref 0.50–1.10)
GFR calc Af Amer: >90 mL/min (ref >90)
GFR calc non Af Amer: >90 mL/min (ref >90)
Glucose, Bld: 102 mg/dL — ABNORMAL HIGH (ref 70–99)
Sodium: 139 mEq/L (ref 135–145)
Total Protein: 5.6 g/dL — ABNORMAL LOW (ref 6.0–8.3)

## 2012-01-28 LAB — MRSA PCR SCREENING: MRSA by PCR: NEGATIVE

## 2012-01-28 MED ORDER — PANTOPRAZOLE SODIUM 40 MG PO TBEC
40.0000 mg | DELAYED_RELEASE_TABLET | Freq: Every day | ORAL | Status: DC
  Administered 2012-01-28: 40 mg via ORAL
  Filled 2012-01-28: qty 1

## 2012-01-28 MED ORDER — SODIUM CHLORIDE 0.9 % IV SOLN: INTRAVENOUS | Status: DC

## 2012-01-28 MED ORDER — POTASSIUM CHLORIDE IN NACL 20-0.9 MEQ/L-% IV SOLN
INTRAVENOUS | Status: DC
  Administered 2012-01-28 (×2): via INTRAVENOUS

## 2012-01-28 MED ORDER — METOPROLOL TARTRATE 25 MG PO TABS
12.5000 mg | ORAL_TABLET | Freq: Two times a day (BID) | ORAL | Status: DC
  Administered 2012-01-28 – 2012-01-29 (×3): 12.5 mg via ORAL
  Filled 2012-01-28 (×3): qty 1

## 2012-01-28 MED ORDER — PEG 3350-KCL-NABCB-NACL-NASULF 236 G PO SOLR
4000.0000 mL | Freq: Once | ORAL | Status: AC
  Administered 2012-01-28: 4000 mL via ORAL
  Filled 2012-01-28: qty 4000

## 2012-01-28 MED ORDER — NICOTINE 14 MG/24HR TD PT24
14.0000 mg | MEDICATED_PATCH | Freq: Every day | TRANSDERMAL | Status: DC
  Filled 2012-01-28 (×2): qty 1

## 2012-01-28 MED ORDER — FENTANYL 12 MCG/HR TD PT72
12.5000 ug | MEDICATED_PATCH | TRANSDERMAL | Status: DC
  Administered 2012-01-29: 12.5 ug via TRANSDERMAL
  Filled 2012-01-28: qty 1

## 2012-01-28 MED ORDER — ACETAMINOPHEN 650 MG RE SUPP: 650.0000 mg | Freq: Four times a day (QID) | RECTAL | Status: DC | PRN

## 2012-01-28 MED ORDER — ONDANSETRON HCL 4 MG/2ML IJ SOLN: 4.0000 mg | Freq: Four times a day (QID) | INTRAMUSCULAR | Status: DC | PRN

## 2012-01-28 MED ORDER — ALBUTEROL SULFATE (5 MG/ML) 0.5% IN NEBU: 2.5000 mg | INHALATION_SOLUTION | RESPIRATORY_TRACT | Status: DC | PRN

## 2012-01-28 MED ORDER — CYCLOBENZAPRINE HCL 10 MG PO TABS: 10.0000 mg | ORAL_TABLET | Freq: Three times a day (TID) | ORAL | Status: DC | PRN

## 2012-01-28 MED ORDER — ACETAMINOPHEN 325 MG PO TABS: 650.0000 mg | ORAL_TABLET | Freq: Four times a day (QID) | ORAL | Status: DC | PRN

## 2012-01-28 MED ORDER — ONDANSETRON HCL 4 MG PO TABS: 4.0000 mg | ORAL_TABLET | Freq: Four times a day (QID) | ORAL | Status: DC | PRN

## 2012-01-28 MED ORDER — SODIUM CHLORIDE 0.9 % IJ SOLN
3.0000 mL | Freq: Two times a day (BID) | INTRAMUSCULAR | Status: DC
  Administered 2012-01-28 – 2012-01-29 (×3): 3 mL via INTRAVENOUS
  Filled 2012-01-28 (×2): qty 3

## 2012-01-28 MED ORDER — IOHEXOL 300 MG/ML  SOLN
100.0000 mL | Freq: Once | INTRAMUSCULAR | Status: AC | PRN
  Administered 2012-01-28: 100 mL via INTRAVENOUS

## 2012-01-28 NOTE — Progress Notes (Signed)
Subjective: The patient has had a small bowel movement this morning with bright red blood intermixed. She denies abdominal pain. She denies chest pain. She denies shortness of breath. Initially, she wanted to leave this morning for outpatient evaluation, but she changed her mind and now wants to stay for in-hospital evaluation.  Objective: Vital signs in last 24 hours: Filed Vitals:   01/28/12 0600 01/28/12 0700 01/28/12 0800 01/28/12 0900  BP: 105/45 101/78 101/53 102/50  Pulse:   85   Temp:   98.5 F (36.9 C)   TempSrc:   Oral   Resp: 21 22 22 21   Height:      Weight:      SpO2:   94%     Intake/Output Summary (Last 24 hours) at 01/28/12 1052 Last data filed at 01/28/12 0800  Gross per 24 hour  Intake    428 ml  Output    975 ml  Net   -547 ml    Weight change:   Physical exam: Lungs: Clear to auscultation bilaterally. Heart: S1, S2, with no murmurs rubs or gallops. Abdomen: Positive bowel sounds, soft, nontender, nondistended. Extremities: No pedal edema.  Lab Results: Basic Metabolic Panel:  Basename 01/28/12 0704 01/27/12 1850  NA 139 138  K 3.7 4.1  CL 107 103  CO2 25 24  GLUCOSE 102* 100*  BUN 14 24*  CREATININE 0.59 0.65  CALCIUM 8.6 9.4  MG -- --  PHOS -- --   Liver Function Tests:  [facility] 01/28/12 0704 01/27/12 1850  AST 14 16  ALT 10 14  ALKPHOS 47 59  BILITOT 0.3 0.2*  PROT 5.6* 6.9  ALBUMIN 3.2* 3.7    Basename 01/27/12 1850  LIPASE 24  AMYLASE --   No results found for this basename: AMMONIA:2 in the last 72 hours CBC:  Basename 01/28/12 0704 01/28/12 0026 01/27/12 1850  WBC 9.6 13.0* --  NEUTROABS -- -- 7.4  HGB 11.2* 11.2* --  HCT 32.8* 32.6* --  MCV 92.7 92.6 --  PLT 180 181 --   Cardiac Enzymes:  Basename 01/27/12 1850  CKTOTAL --  CKMB --  CKMBINDEX --  TROPONINI <0.30   BNP: No results found for this basename: PROBNP:3 in the last 72 hours D-Dimer: No results found for this basename: DDIMER:2 in the last 72  hours CBG: No results found for this basename: GLUCAP:6 in the last 72 hours Hemoglobin A1C: No results found for this basename: HGBA1C in the last 72 hours Fasting Lipid Panel: No results found for this basename: CHOL,HDL,LDLCALC,TRIG,CHOLHDL,LDLDIRECT in the last 72 hours Thyroid Function Tests: No results found for this basename: TSH,T4TOTAL,FREET4,T3FREE,THYROIDAB in the last 72 hours Anemia Panel: No results found for this basename: VITAMINB12,FOLATE,FERRITIN,TIBC,IRON,RETICCTPCT in the last 72 hours Coagulation:  Basename 01/27/12 1850  LABPROT 13.2  INR 0.98   Urine Drug Screen: Drugs of Abuse  No results found for this basename: labopia,  cocainscrnur,  labbenz,  amphetmu,  thcu,  labbarb    Alcohol Level: No results found for this basename: ETH:2 in the last 72 hours Urinalysis:  Basename 01/27/12 2226  COLORURINE YELLOW  LABSPEC >1.030*  PHURINE 5.5  GLUCOSEU NEGATIVE  HGBUR TRACE*  BILIRUBINUR NEGATIVE  KETONESUR NEGATIVE  PROTEINUR NEGATIVE  UROBILINOGEN 0.2  NITRITE NEGATIVE  LEUKOCYTESUR NEGATIVE   Misc. Labs:   Micro: Recent Results (from the past 240 hour(s))  MRSA PCR SCREENING     Status: Normal   Collection Time   01/28/12 12:35 AM  Component Value Range Status Comment   MRSA by PCR NEGATIVE  NEGATIVE  Final     Studies/Results: Ct Abdomen Pelvis W Contrast  01/28/2012  *RADIOLOGY REPORT*  Clinical Data: Rectal bleeding.  CT ABDOMEN AND PELVIS WITH CONTRAST  Technique:  Multidetector CT imaging of the abdomen and pelvis was performed using the standard protocol during bolus administration of intravenous contrast.  Contrast: 80 mL of Omnipaque 300 IV contrast  Comparison:   None.  Findings:  Mild bibasilar atelectasis is noted.  Minimal nodularity at the right lung base appears stable from the prior study. Scattered coronary artery calcifications are seen.  The liver and spleen are unremarkable in appearance.  The gallbladder is within normal  limits.  The pancreas and adrenal glands are unremarkable.  The kidneys are unremarkable in appearance.  There is no evidence of hydronephrosis.  No renal or ureteral stones are seen.  No perinephric stranding is appreciated.  No free fluid is identified.  The small bowel is unremarkable in appearance.  The stomach is within normal limits.  No acute vascular abnormalities are seen.  Diffuse calcification is noted along the abdominal aorta and its branches.  There is focal intramural thrombus along the distal descending thoracic aorta, without significant intraluminal narrowing.  The appendix is not seen; there is no evidence for appendicitis. The ascending and transverse colon filled with stool.  Diffuse diverticulosis is noted along the descending and residual sigmoid colon.  Minimal wall thickening may be present at the sigmoid colon just proximal to the anastomosis.   The patient's rectal bleeding likely arises from one of these diverticula.  No definite soft tissue inflammation is seen to suggest acute diverticulitis.  The patient's sigmoid colon anastomosis is grossly unremarkable in appearance.  The bladder is mildly distended and grossly unremarkable in appearance.  The patient is status post hysterectomy; no suspicious adnexal masses are seen.  No inguinal lymphadenopathy is seen.  No acute osseous abnormalities are identified.  Vacuum phenomenon is noted at L1-L2.  IMPRESSION:  1.  Diffuse diverticulosis along the descending and residual sigmoid colon; the patient's rectal bleeding likely arises from one of these diverticula.  No definite soft tissue inflammation seen to suggest acute diverticulitis, though minimal wall thickening may be present at the sigmoid colon just proximal to the anastomosis. 2.  Sigmoid colon anastomosis is grossly unremarkable in appearance. 3.  Diffuse calcification along the abdominal aorta and its branches. 4.  Focal intramural thrombus along the distal descending thoracic  aorta, without significant intraluminal narrowing. 5.  Scattered coronary artery calcifications seen. 6.  Mild bibasilar atelectasis noted; minimal nodularity at the right lung base appears stable from the prior study.  Original Report Authenticated By: Tonia Ghent, M.D.    Medications: I have reviewed the patient's current medications.  Assessment: Principal Problem:  *Acute GI bleeding Active Problems:  HEPATITIS C  NICOTINE ADDICTION  ESSENTIAL HYPERTENSION  Palpitations  Hematochezia  Anemia due to blood loss, acute  Leukocytosis  Aortic mural thrombus  Diverticulosis   1. Hematochezia/GI bleeding, in the setting of antiplatelet therapy with Plavix. Etiology unknown at this time. She does have a history of diverticulosis and it is likely she is having diverticular bleeding. GI has hours been consulted. The plan is for a colonoscopy tomorrow.   Acute blood loss anemia. This has not necessitated transfusion.  Incidental finding of focal intramural thrombus along the descending thoracic aorta. No significant narrowing per radiology. We'll order a 2-D echocardiogram for further evaluation and discuss  with cardiology. Anticoagulation is currently contraindicated because of active bleeding.  Leukocytosis. This may be reactive. No active infection noted.   History of palpitations/history of TIA. The patient may be on Plavix for this. Plavix was discontinued because of the GI bleeding.  Nicotine addition/tobacco abuse. She was advised to quit. Nicotine replacement therapy has been started.   Plan:   1. Continue to monitor her hemoglobin and hematocrit. Continue IV fluid hydration. 2. GI consult has been obtained. The plan is for the patient to undergo a colonoscopy tomorrow. Clear liquid diet will be started. Appreciate GI's input.    LOS: 1 day   Clotiel Troop 01/28/2012, 10:52 AM

## 2012-01-28 NOTE — Progress Notes (Signed)
Utilization review completed.  

## 2012-01-28 NOTE — Progress Notes (Signed)
*  PRELIMINARY RESULTS* Echocardiogram 2D Echocardiogram has been performed.  [name] 01/28/2012, 2:48 PM
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
## 2012-01-29 ENCOUNTER — Encounter (HOSPITAL_COMMUNITY): Payer: Self-pay | Admitting: *Deleted

## 2012-01-29 ENCOUNTER — Encounter (HOSPITAL_COMMUNITY)
Admission: EM | Disposition: A | Discharge status: Home / Self Care | Payer: Self-pay | Source: Home / Self Care | Attending: Internal Medicine

## 2012-01-29 DIAGNOSIS — D126 Benign neoplasm of colon, unspecified: Secondary | ICD-10-CM | POA: Diagnosis not present

## 2012-01-29 DIAGNOSIS — K2990 Gastroduodenitis, unspecified, without bleeding: Secondary | ICD-10-CM | POA: Diagnosis not present

## 2012-01-29 DIAGNOSIS — K2900 Acute gastritis without bleeding: Secondary | ICD-10-CM | POA: Diagnosis not present

## 2012-01-29 DIAGNOSIS — K922 Gastrointestinal hemorrhage, unspecified: Secondary | ICD-10-CM | POA: Diagnosis not present

## 2012-01-29 DIAGNOSIS — K5730 Diverticulosis of large intestine without perforation or abscess without bleeding: Secondary | ICD-10-CM | POA: Diagnosis not present

## 2012-01-29 DIAGNOSIS — F17200 Nicotine dependence, unspecified, uncomplicated: Secondary | ICD-10-CM | POA: Diagnosis not present

## 2012-01-29 LAB — CBC
HCT: 30.9 % — ABNORMAL LOW (ref 36.0–46.0)
Hemoglobin: 10.5 g/dL — ABNORMAL LOW (ref 12.0–15.0)
MCH: 31.5 pg (ref 26.0–34.0)
MCV: 92.8 fL (ref 78.0–100.0)
Platelets: 181 10*3/uL (ref 150–400)
RBC: 3.33 MIL/uL — ABNORMAL LOW (ref 3.87–5.11)
WBC: 8.7 10*3/uL (ref 4.0–10.5)

## 2012-01-29 LAB — BASIC METABOLIC PANEL
BUN: 5 mg/dL — ABNORMAL LOW (ref 6–23)
CO2: 27 mEq/L (ref 19–32)
Calcium: 8.9 mg/dL (ref 8.4–10.5)
Chloride: 107 mEq/L (ref 96–112)
Creatinine, Ser: 0.58 mg/dL (ref 0.50–1.10)
Glucose, Bld: 96 mg/dL (ref 70–99)

## 2012-01-29 SURGERY — COLONOSCOPY WITH ESOPHAGOGASTRODUODENOSCOPY (EGD)
Anesthesia: Moderate Sedation

## 2012-01-29 MED ORDER — MIDAZOLAM HCL 2 MG/2ML IJ SOLN
INTRAMUSCULAR | Status: AC
  Administered 2012-01-29: 2 mg
  Filled 2012-01-29: qty 2

## 2012-01-29 MED ORDER — PROMETHAZINE HCL 25 MG/ML IJ SOLN
INTRAMUSCULAR | Status: AC
  Filled 2012-01-29: qty 1

## 2012-01-29 MED ORDER — THERA VITAL M PO TABS: 1.0000 | ORAL_TABLET | Freq: Every day | ORAL | Status: DC

## 2012-01-29 MED ORDER — MIDAZOLAM HCL 5 MG/5ML IJ SOLN
INTRAMUSCULAR | Status: AC
  Filled 2012-01-29: qty 10

## 2012-01-29 MED ORDER — MIDAZOLAM HCL 5 MG/5ML IJ SOLN
INTRAMUSCULAR | Status: DC | PRN
  Administered 2012-01-29 (×2): 2 mg via INTRAVENOUS
  Administered 2012-01-29: 1 mg via INTRAVENOUS

## 2012-01-29 MED ORDER — PROMETHAZINE HCL 25 MG/ML IJ SOLN
25.0000 mg | Freq: Once | INTRAMUSCULAR | Status: AC
  Administered 2012-01-29: 25 mg via INTRAVENOUS

## 2012-01-29 MED ORDER — MEPERIDINE HCL 100 MG/ML IJ SOLN
INTRAMUSCULAR | Status: AC
  Filled 2012-01-29: qty 2

## 2012-01-29 MED ORDER — PANTOPRAZOLE SODIUM 40 MG PO TBEC: 40.0000 mg | DELAYED_RELEASE_TABLET | Freq: Every day | ORAL | Status: DC

## 2012-01-29 MED ORDER — MEPERIDINE HCL 100 MG/ML IJ SOLN
INTRAMUSCULAR | Status: DC | PRN
  Administered 2012-01-29: 25 mg via INTRAVENOUS
  Administered 2012-01-29: 50 mg via INTRAVENOUS

## 2012-01-29 MED ORDER — MIDAZOLAM HCL 5 MG/ML IJ SOLN
2.0000 mg | Freq: Once | INTRAMUSCULAR | Status: AC
  Administered 2012-01-29: 2 mg via INTRAVENOUS

## 2012-01-29 MED ORDER — SODIUM CHLORIDE 0.45 % IV SOLN
Freq: Once | INTRAVENOUS | Status: AC
  Administered 2012-01-29: 08:00:00 via INTRAVENOUS

## 2012-01-29 MED ORDER — CLOPIDOGREL BISULFATE 75 MG PO TABS: 37.5000 mg | ORAL_TABLET | ORAL | Status: DC

## 2012-01-29 MED ORDER — SODIUM CHLORIDE 0.9 % IJ SOLN
INTRAMUSCULAR | Status: AC
  Filled 2012-01-29: qty 10

## 2012-01-29 NOTE — Progress Notes (Signed)
Pt d/c instructions, medications and follow up appts given to pt. No issues or concerns addressed. Pt d/ced home at this time. Pt ambulated out of facility via staff.

## 2012-01-29 NOTE — Progress Notes (Signed)
0540-administered 1st tap water enema. 0600-admin. 2nd and results are now clear.

## 2012-01-29 NOTE — Op Note (Signed)
[facility] [address] [place], [place]  [address]  ENDOSCOPY PROCEDURE REPORT  PATIENT:  [name], [name]  MR#:  [id number] BIRTHDATE:  1951-07-25, 60 yrs. old  GENDER:  female  ENDOSCOPIST:  R. Roetta Sessions, MD Caleen Essex Referred by:  Wyonia Hough, M.D. Syliva Overman, M.D.  PROCEDURE DATE:  01/29/2012 PROCEDURE:  EGD with biopsy  INDICATIONS:  hematochezia with significant drop in hemoglobin and negative colonoscopy earlier today  INFORMED CONSENT:   The risks, benefits, limitations, alternatives and imponderables have been discussed.  The potential for biopsy, esophogeal dilation, etc. have also been reviewed.  Questions have been answered.  All parties agreeable.  Please see the history and physical in the medical record for more information.  MEDICATIONS:     Phenergan 25 mg IV as well as Demerol and Versed in incremental doses.  DESCRIPTION OF PROCEDURE:   The EG-[id number] ([id number]) endoscope was introduced through the mouth and advanced to the second portion of the duodenum without difficulty or limitations.  The mucosal surfaces were surveyed very carefully during advancement of the scope and upon withdrawal.  Retroflexion view of the proximal stomach and esophagogastric junction was performed.  <<PROCEDUREIMAGES>>  FINDINGS:  normal esophagus. Stomach empty. Examination of the gastric mucosa revealed multiple antral erosions and evidence of a partial/slipped fundoplication versus a toupe. Also there appears to be a small recurrent hiatal hernia and possible small paraesophageal hernia present. No ulcer and no infiltrating process. duodenum through the second portion appeared normal. No blood in the upper GI tract. THERAPEUTIC / DIAGNOSTIC MANEUVERS PERFORMED:    biopsies of the antral erosions taken.  COMPLICATIONS:   None  IMPRESSION:    Prior fundoplication with probable slippage. Small hiatal hernia and possible small paraesophageal component.  Antral erosions-  status post biopsy  - otherwise normal examination. Etiology of GI bleed not adequately explained with today's procedures.  RECOMMENDATIONS:   Advance diet. Outpatient capsule study of small intestine.  Follow up on pathology  ______________________________ R. Roetta Sessions, MD Caleen Essex  CC:  n. eSIGNED:   R. Roetta Sessions at 01/29/2012 09:23 AM  Atilano Median, [phone]

## 2012-01-29 NOTE — Discharge Summary (Signed)
Physician Discharge Summary  [name] MRN: [id number] DOB/AGE: [birth year] 61 y.o.  PCP: Syliva Overman, MD, MD   Admit date: 01/27/2012 Discharge date: 01/29/2012  Discharge Diagnoses:  1. GI bleeding. Clear etiology was not elucidated during the hospitalization.     Outpatient capsule study of small intestine will be ordered by GI. 2. Antral erosions per EGD by Dr. Jena Gauss on March 21st 2013. Biopsies taken. 3. Small hiatal hernia and possible small paraesophageal hernia also present per EGD. 4. Surgical anastomosis at 15 cm, residual distal colonic diverticula, blind pouch, diverticula, 4 mm polyp in the pouch per colonoscopy by Dr. Jena Gauss on January 29 2012. No blood or suspected lesion in the lower GI tract. 5. 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     2-D echocardiogram revealed grade 1 diastolic dysfunction and ejection fraction of 55-60% 6. Anemia secondary to acute blood loss. The patient's hemoglobin was 14.4 on admission and 10.5 at the time of discharge. 7. Leukocytosis, reactive. Resolved. 8. Hypertension. Stable. 9. Tobacco abuse/nicotine addiction. Advised against.    Medication List  As of 01/29/2012  6:40 PM   TAKE these medications         calcium carbonate 600 MG Tabs   Commonly known as: OS-CAL   Take 600 mg by mouth daily.      CARDIZEM CD 120 MG 24 hr capsule   Generic drug: diltiazem   Take 120 mg by mouth every morning. For blood pressure      cetirizine 10 MG tablet   Commonly known as: ZYRTEC   Take 10 mg by mouth daily. As  Needed for hives      clopidogrel 75 MG tablet   Commonly known as: PLAVIX   Take 0.5 tablets (37.5 mg total) by mouth every morning. RESTART THIS MEDICATION ON MARCH 23RD 2013. One half daily per patient      cyclobenzaprine 10 MG tablet   Commonly known as: FLEXERIL   Take 10 mg by  mouth 3 (three) times daily as needed.      EPIPEN 2-PAK 0.3 mg/0.3 mL Devi   Generic drug: EPINEPHrine   Inject 0.3 mg into the muscle once. For an acute severe allergic reaction      fentaNYL 12 MCG/HR   Commonly known as: DURAGESIC - dosed mcg/hr   Place 1 patch onto the skin every third day. Apply 1 patch every 3 days and remove the old patch      HYDROcodone-acetaminophen 7.5-500 MG per tablet   Commonly known as: LORTAB   Take 1 tablet by mouth every 6 (six) hours as needed. FOR PAIN      metoprolol tartrate 25 MG tablet   Commonly known as: LOPRESSOR   Take 0.5 tablets (12.5 mg total) by mouth 2 (two) times daily.      multivitamin tablet   Take 1 tablet by mouth daily.      neomycin-bacitracin-polymyxin ophthalmic ointment   Commonly known as: POLYSPORIN   Place into both eyes as needed.      pantoprazole 40 MG tablet   Commonly known as: PROTONIX   Take 1 tablet (40 mg total) by mouth daily. For stomach ulcerations.      ZETIA 10 MG tablet   Generic drug: ezetimibe      ZOCOR 40 MG tablet   Generic drug: simvastatin   TAKE 1 TABLET BY  MOUTH ONCE DAILY.            Discharge Condition: Improved and stable.  Disposition: 01-Home or Self Care   Consults: Roetta Sessions, M.D.   Significant Diagnostic Studies: Ct Abdomen Pelvis W Contrast  01/28/2012  *RADIOLOGY REPORT*  Clinical Data: Rectal bleeding.  CT ABDOMEN AND PELVIS WITH CONTRAST  Technique:  Multidetector CT imaging of the abdomen and pelvis was performed using the standard protocol during bolus administration of intravenous contrast.  Contrast: 80 mL of Omnipaque 300 IV contrast  Comparison:   None.  Findings:  Mild bibasilar atelectasis is noted.  Minimal nodularity at the right lung base appears stable from the prior study. Scattered coronary artery calcifications are seen.  The liver and spleen are unremarkable in appearance.  The gallbladder is within normal limits.  The pancreas and adrenal glands  are unremarkable.  The kidneys are unremarkable in appearance.  There is no evidence of hydronephrosis.  No renal or ureteral stones are seen.  No perinephric stranding is appreciated.  No free fluid is identified.  The small bowel is unremarkable in appearance.  The stomach is within normal limits.  No acute vascular abnormalities are seen.  Diffuse calcification is noted along the abdominal aorta and its branches.  There is focal intramural thrombus along the distal descending thoracic aorta, without significant intraluminal narrowing.  The appendix is not seen; there is no evidence for appendicitis. The ascending and transverse colon filled with stool.  Diffuse diverticulosis is noted along the descending and residual sigmoid colon.  Minimal wall thickening may be present at the sigmoid colon just proximal to the anastomosis.   The patient's rectal bleeding likely arises from one of these diverticula.  No definite soft tissue inflammation is seen to suggest acute diverticulitis.  The patient's sigmoid colon anastomosis is grossly unremarkable in appearance.  The bladder is mildly distended and grossly unremarkable in appearance.  The patient is status post hysterectomy; no suspicious adnexal masses are seen.  No inguinal lymphadenopathy is seen.  No acute osseous abnormalities are identified.  Vacuum phenomenon is noted at L1-L2.  IMPRESSION:  1.  Diffuse diverticulosis along the descending and residual sigmoid colon; the patient's rectal bleeding likely arises from one of these diverticula.  No definite soft tissue inflammation seen to suggest acute diverticulitis, though minimal wall thickening may be present at the sigmoid colon just proximal to the anastomosis. 2.  Sigmoid colon anastomosis is grossly unremarkable in appearance. 3.  Diffuse calcification along the abdominal aorta and its branches. 4.  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 5.  Scattered coronary artery calcifications seen. 6.  Mild bibasilar atelectasis noted; minimal nodularity at the right lung base appears stable from the prior study.  Original Report Authenticated By: Tonia Ghent, M.D.   ECHO:Study Conclusions  - Left ventricle: The cavity size was normal. Wall thickness was normal. Systolic function was normal. The estimated ejection fraction was in the range of 55% to 60%. Wall motion was normal; there were no regional wall motion abnormalities. Doppler parameters are consistent with abnormal left ventricular relaxation (grade 1 diastolic dysfunction). - Aortic valve: Trileaflet; mildly thickened leaflets. Apparent heavy nodular calcification involving annulus near region of noncoronary cusp. Not well visualized in all views. Does not appear mobile or clearly affixed to a leaflet. - Mitral valve: Mildly thickened leaflets . Mild regurgitation. - Left atrium: The atrium was mildly dilated. - Atrial septum: There was increased thickness of the  septum, consistent with lipomatous hypertrophy. A patent foramen ovale cannot be excluded. - Tricuspid valve: Trivial regurgitation. - Pericardium, extracardiac: There was no pericardial effusion. Transthoracic echocardiography. M-mode, complete 2D, spectral Doppler, and color Doppler. Height: Height: 160cm. Height: 63in. Weight: Weight: 57.2kg. Weight: 125.7lb. Body mass index: BMI: 22.3kg/m^2. Body surface area: BSA: 1.7m^2. Patient status: Inpatient. Location: ICU/CCU      Microbiology: Recent Results (from the past 240 hour(s))  MRSA PCR SCREENING     Status: Normal   Collection Time   01/28/12 12:35 AM      Component Value Range Status Comment   MRSA by PCR NEGATIVE  NEGATIVE  Final      Labs: Results for orders placed during the hospital encounter of 01/27/12 (from the past 48 hour(s))  CBC     Status: Abnormal   Collection Time   01/27/12  6:50 PM      Component Value Range  Comment   WBC 11.9 (*) 4.0 - 10.5 (K/uL)    RBC 4.53  3.87 - 5.11 (MIL/uL)    Hemoglobin 14.4  12.0 - 15.0 (g/dL)    HCT 16.1  09.6 - 04.5 (%)    MCV 92.3  78.0 - 100.0 (fL)    MCH 31.8  26.0 - 34.0 (pg)    MCHC 34.4  30.0 - 36.0 (g/dL)    RDW 40.9  81.1 - 91.4 (%)    Platelets 210  150 - 400 (K/uL)   DIFFERENTIAL     Status: Normal   Collection Time   01/27/12  6:50 PM      Component Value Range Comment   Neutrophils Relative 62  43 - 77 (%)    Neutro Abs 7.4  1.7 - 7.7 (K/uL)    Lymphocytes Relative 28  12 - 46 (%)    Lymphs Abs 3.3  0.7 - 4.0 (K/uL)    Monocytes Relative 8  3 - 12 (%)    Monocytes Absolute 0.9  0.1 - 1.0 (K/uL)    Eosinophils Relative 2  0 - 5 (%)    Eosinophils Absolute 0.2  0.0 - 0.7 (K/uL)    Basophils Relative 0  0 - 1 (%)    Basophils Absolute 0.0  0.0 - 0.1 (K/uL)   COMPREHENSIVE METABOLIC PANEL     Status: Abnormal   Collection Time   01/27/12  6:50 PM      Component Value Range Comment   Sodium 138  135 - 145 (mEq/L)    Potassium 4.1  3.5 - 5.1 (mEq/L)    Chloride 103  96 - 112 (mEq/L)    CO2 24  19 - 32 (mEq/L)    Glucose, Bld 100 (*) 70 - 99 (mg/dL)    BUN 24 (*) 6 - 23 (mg/dL)    Creatinine, Ser 7.82  0.50 - 1.10 (mg/dL)    Calcium 9.4  8.4 - 10.5 (mg/dL)    Total Protein 6.9  6.0 - 8.3 (g/dL)    Albumin 3.7  3.5 - 5.2 (g/dL)    AST 16  0 - 37 (U/L)    ALT 14  0 - 35 (U/L)    Alkaline Phosphatase 59  39 - 117 (U/L)    Total Bilirubin 0.2 (*) 0.3 - 1.2 (mg/dL)    GFR calc non Af Amer >90  >90 (mL/min)    GFR calc Af Amer >90  >90 (mL/min)   PROTIME-INR     Status: Normal   Collection Time   01/27/12  6:50 PM      Component Value Range Comment   Prothrombin Time 13.2  11.6 - 15.2 (seconds)    INR 0.98  0.00 - 1.49    TYPE AND SCREEN     Status: Normal   Collection Time   01/27/12  6:50 PM      Component Value Range Comment   ABO/RH(D) A POS      Antibody Screen NEG      Sample Expiration 01/30/2012     TROPONIN I     Status: Normal    Collection Time   01/27/12  6:50 PM      Component Value Range Comment   Troponin I <0.30  <0.30 (ng/mL)   LIPASE, BLOOD     Status: Normal   Collection Time   01/27/12  6:50 PM      Component Value Range Comment   Lipase 24  11 - 59 (U/L)   CBC     Status: Abnormal   Collection Time   01/27/12  8:23 PM      Component Value Range Comment   WBC 17.8 (*) 4.0 - 10.5 (K/uL)    RBC 3.80 (*) 3.87 - 5.11 (MIL/uL)    Hemoglobin 12.3  12.0 - 15.0 (g/dL)    HCT 86.5 (*) 78.4 - 46.0 (%)    MCV 93.2  78.0 - 100.0 (fL)    MCH 32.4  26.0 - 34.0 (pg)    MCHC 34.7  30.0 - 36.0 (g/dL)    RDW 69.6  29.5 - 28.4 (%)    Platelets 202  150 - 400 (K/uL)   URINALYSIS, ROUTINE W REFLEX MICROSCOPIC     Status: Abnormal   Collection Time   01/27/12 10:26 PM      Component Value Range Comment   Color, Urine YELLOW  YELLOW     APPearance CLEAR  CLEAR     Specific Gravity, Urine >1.030 (*) 1.005 - 1.030     pH 5.5  5.0 - 8.0     Glucose, UA NEGATIVE  NEGATIVE (mg/dL)    Hgb urine dipstick TRACE (*) NEGATIVE     Bilirubin Urine NEGATIVE  NEGATIVE     Ketones, ur NEGATIVE  NEGATIVE (mg/dL)    Protein, ur NEGATIVE  NEGATIVE (mg/dL)    Urobilinogen, UA 0.2  0.0 - 1.0 (mg/dL)    Nitrite NEGATIVE  NEGATIVE     Leukocytes, UA NEGATIVE  NEGATIVE    URINE MICROSCOPIC-ADD ON     Status: Abnormal   Collection Time   01/27/12 10:26 PM      Component Value Range Comment   Squamous Epithelial / LPF FEW (*) RARE     WBC, UA 3-6  <3 (WBC/hpf)    Bacteria, UA RARE  RARE     Urine-Other MUCOUS PRESENT     CBC     Status: Abnormal   Collection Time   01/28/12 12:26 AM      Component Value Range Comment   WBC 13.0 (*) 4.0 - 10.5 (K/uL)    RBC 3.52 (*) 3.87 - 5.11 (MIL/uL)    Hemoglobin 11.2 (*) 12.0 - 15.0 (g/dL)    HCT 13.2 (*) 44.0 - 46.0 (%)    MCV 92.6  78.0 - 100.0 (fL)    MCH 31.8  26.0 - 34.0 (pg)    MCHC 34.4  30.0 - 36.0 (g/dL)    RDW 10.2  72.5 - 36.6 (%)    Platelets 181  150 - 400 (  K/uL)   MRSA PCR  SCREENING     Status: Normal   Collection Time   01/28/12 12:35 AM      Component Value Range Comment   MRSA by PCR NEGATIVE  NEGATIVE    CBC     Status: Abnormal   Collection Time   01/28/12  7:04 AM      Component Value Range Comment   WBC 9.6  4.0 - 10.5 (K/uL)    RBC 3.54 (*) 3.87 - 5.11 (MIL/uL)    Hemoglobin 11.2 (*) 12.0 - 15.0 (g/dL)    HCT 16.1 (*) 09.6 - 46.0 (%)    MCV 92.7  78.0 - 100.0 (fL)    MCH 31.6  26.0 - 34.0 (pg)    MCHC 34.1  30.0 - 36.0 (g/dL)    RDW 04.5  40.9 - 81.1 (%)    Platelets 180  150 - 400 (K/uL)   COMPREHENSIVE METABOLIC PANEL     Status: Abnormal   Collection Time   01/28/12  7:04 AM      Component Value Range Comment   Sodium 139  135 - 145 (mEq/L)    Potassium 3.7  3.5 - 5.1 (mEq/L)    Chloride 107  96 - 112 (mEq/L)    CO2 25  19 - 32 (mEq/L)    Glucose, Bld 102 (*) 70 - 99 (mg/dL)    BUN 14  6 - 23 (mg/dL)    Creatinine, Ser 9.14  0.50 - 1.10 (mg/dL)    Calcium 8.6  8.4 - 10.5 (mg/dL)    Total Protein 5.6 (*) 6.0 - 8.3 (g/dL)    Albumin 3.2 (*) 3.5 - 5.2 (g/dL)    AST 14  0 - 37 (U/L)    ALT 10  0 - 35 (U/L)    Alkaline Phosphatase 47  39 - 117 (U/L)    Total Bilirubin 0.3  0.3 - 1.2 (mg/dL)    GFR calc non Af Amer >90  >90 (mL/min)    GFR calc Af Amer >90  >90 (mL/min)   CBC     Status: Abnormal   Collection Time   01/28/12  1:19 PM      Component Value Range Comment   WBC 7.6  4.0 - 10.5 (K/uL)    RBC 3.14 (*) 3.87 - 5.11 (MIL/uL)    Hemoglobin 9.9 (*) 12.0 - 15.0 (g/dL)    HCT 78.2 (*) 95.6 - 46.0 (%)    MCV 92.7  78.0 - 100.0 (fL)    MCH 31.5  26.0 - 34.0 (pg)    MCHC 34.0  30.0 - 36.0 (g/dL)    RDW 21.3  08.6 - 57.8 (%)    Platelets 154  150 - 400 (K/uL)   CBC     Status: Abnormal   Collection Time   01/28/12  6:56 PM      Component Value Range Comment   WBC 10.5  4.0 - 10.5 (K/uL)    RBC 3.31 (*) 3.87 - 5.11 (MIL/uL)    Hemoglobin 10.4 (*) 12.0 - 15.0 (g/dL)    HCT 46.9 (*) 62.9 - 46.0 (%)    MCV 93.1  78.0 - 100.0 (fL)     MCH 31.4  26.0 - 34.0 (pg)    MCHC 33.8  30.0 - 36.0 (g/dL)    RDW 52.8  41.3 - 24.4 (%)    Platelets 176  150 - 400 (K/uL)   CBC     Status: Abnormal  Collection Time   01/29/12  5:33 AM      Component Value Range Comment   WBC 8.7  4.0 - 10.5 (K/uL)    RBC 3.33 (*) 3.87 - 5.11 (MIL/uL)    Hemoglobin 10.5 (*) 12.0 - 15.0 (g/dL)    HCT 78.2 (*) 95.6 - 46.0 (%)    MCV 92.8  78.0 - 100.0 (fL)    MCH 31.5  26.0 - 34.0 (pg)    MCHC 34.0  30.0 - 36.0 (g/dL)    RDW 21.3  08.6 - 57.8 (%)    Platelets 181  150 - 400 (K/uL)   BASIC METABOLIC PANEL     Status: Abnormal   Collection Time   01/29/12  5:33 AM      Component Value Range Comment   Sodium 142  135 - 145 (mEq/L)    Potassium 3.9  3.5 - 5.1 (mEq/L)    Chloride 107  96 - 112 (mEq/L)    CO2 27  19 - 32 (mEq/L)    Glucose, Bld 96  70 - 99 (mg/dL)    BUN 5 (*) 6 - 23 (mg/dL)    Creatinine, Ser 4.69  0.50 - 1.10 (mg/dL)    Calcium 8.9  8.4 - 10.5 (mg/dL)    GFR calc non Af Amer >90  >90 (mL/min)    GFR calc Af Amer >90  >90 (mL/min)      HPI : The patient is a 61 year old woman with a past medical history significant for hypertension, chronic palpitations, hepatitis C, and sigmoid colectomy for recurrent diverticulitis.. She presented to the emergency department on January 27 2012 with a chief complaint of rectal bleeding. In the emergency department, she was noted to be afebrile and mildly tachycardic with a heart rate of 104 beats per minute. Otherwise her blood pressure was within normal limits. Her lab data were significant for a WBC of 11.9, normal hemoglobin of 14.4, normal LFTs, normal lipase, normal troponin I., and normal PT/INR. She was admitted for further evaluation and management.  HOSPITAL COURSE: The patient was admitted to the step down unit for closer observation. IV fluids were started. Plavix was withheld. She was typed and screened for 2 units of packed red blood cells but did not require transfusion. Her hemoglobin  and hematocrit was monitored every 6 hours. Protonix was started prophylactically. Her antihypertensive medications were continued but with holding parameters for hypotension. She was counseled on tobacco cessation. A nicotine patch was placed daily. For further evaluation, a CT scan of her abdomen and pelvis was ordered. It revealed diffuse diverticulosis but no evidence of diverticulitis. 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  Gastroenterologist, Dr. Jena Gauss was consulted. He agreed with medical management. He proceeded with a total colonoscopy and EGD. The results of the procedures were dictated above. There was no obvious etiology of the bleeding and no active bleeding seen during both studies. Biopsies were taken. The pathology results were pending at the time of discharge. Dr. Jena Gauss will evaluate the patient further in the outpatient setting with a small bowel capsule study. He recommended that the patient be restarted on Plavix in several days. She should remain on proton pump inhibitor therapy daily.  The patient was noted to have an interval finding of an intramural thrombus in her distal descending thoracic aorta. For this reason, an echocardiogram was ordered. The results are above. The patient did not  appear to have any sequelae of this incidental finding. Anticoagulation was relatively contraindicated during the hospitalization. She will be restarted on Plavix in several days per the recommendation of Dr. Jena Gauss. An appointment was made for her to followup with Dr. Dietrich Pates next week for further evaluation and management of this finding. The patient was encouraged to stop smoking.   The patient had no further GI bleeding. Her hemoglobin did drift down slowly to 10.5, however, there was no indication for a blood transfusion.    Discharge Exam: Blood pressure  108/68, pulse 73, temperature 97.5 F (36.4 C), temperature source Oral, resp. rate 19, height 5\' 3"  (1.6 m), weight 57.5 kg (126 lb 12.2 oz), SpO2 100.00%.  Lungs: Decreased breath sounds in the bases, otherwise clear. Heart: S1, S2, with a soft systolic murmur. Abdomen: Positive bowel sounds, soft, nontender, nondistended. Extremities: No pedal edema.   Discharge Orders    Future Appointments: Provider: Department: Dept Phone: Center:   02/05/2012 3:00 PM Jodelle Gross, NP [facility] [phone] [facility]   02/16/2012 10:00 AM [name], MD [facility] [phone] [facility]   04/28/2012 9:10 AM [facility] [phone] None   04/30/2012 9:00 AM Randall An, MD [facility] [phone] None     Future Orders Please Complete By Expires   Diet - low sodium heart healthy      Increase activity slowly      Discharge instructions      Comments:   Try to stop smoking. Avoid aspirin-like products. Restart Plavix on March 23rd 2013. Dr. Luvenia Starch office will call you regarding the biopsy results and to schedule the capsule study.      Follow-up Information    Follow up with Eula Listen, MD. (His office will call you. If you have not heard back from him in 1-2 weeks, call his office.)    Contact information:   [address] [place] [address] [phone]       Follow up with Avoca Bing, MD on 02/05/2012. (At 3:00 PM)    Contact information:   [address] [place] [address] [phone]       Schedule an appointment as soon as possible for a visit with Syliva Overman, MD.   Contact information:   [address] [place] [address] [phone]           Total discharge time: 40 minutes.   Signed: Danil Wedge 01/29/2012, 6:40 PM

## 2012-01-29 NOTE — Progress Notes (Signed)
Pt transferred directly from endoscopy to room 309. Report given to [facility] from ICU.

## 2012-01-29 NOTE — OR Nursing (Signed)
Upon arrival to endo pt combative; sitting up in bed then flopping back; was able to tell birthday/name.  Pulling at wires.

## 2012-01-29 NOTE — OR Nursing (Signed)
Phenergan 25mg  IV given at 0736 per MD order prior to procedure. At 0800 patient began to holler out and state, "I feel like my insides are coming out:" and thrashing around in the bed. Dr. Jena Gauss notified by Cliffton Asters RN and order received for Versed 2 mg IV. Versed 2mg  IV given, oxygen applied via nasal cannula at 2 L. BP 124/98, P 87 and respirations 20. Will continue to monitor patient.

## 2012-01-29 NOTE — Op Note (Signed)
[facility] [address] [place], [place]  [address]  COLONOSCOPY PROCEDURE REPORT  PATIENT:  [name], [name]  MR#:  [id number] BIRTHDATE:  Sep 08, 1951, 60 yrs. old  GENDER:  female ENDOSCOPIST:  R. Roetta Sessions, MD FACP [facility] REF. BY:          Hospitalist PROCEDURE DATE:  01/29/2012 PROCEDURE:  diagnostic ileocolonoscopy with snare polypectomy  INDICATIONS:  hematochezia  INFORMED CONSENT:  The risks, benefits, alternatives and imponderables including but not limited to bleeding, perforation as well as the possibility of a missed lesion have been reviewed. The potential for biopsy, lesion removal, etc. have also been discussed.  Questions have been answered.  All parties agreeable. Please see the history and physical in the medical record for more information.  MEDICATIONS:  Phenergan 25 mg IV and Demerol 75 mg IV and Versed 4 mg IV in divided doses.  DESCRIPTION OF PROCEDURE:  After a digital rectal exam was performed, the EC-[id number] ([id number]) colonoscope was advanced from the anus through the rectum and colon to the area of the cecum, ileocecal valve and appendiceal orifice.  The cecum was deeply intubated.  These structures were well-seen and photographed for the record.  From the level of the cecum and ileocecal valve, the scope was slowly and cautiously withdrawn.  The mucosal surfaces were carefully surveyed utilizing scope tip deflection to facilitate fold flattening as needed.  The scope was pulled down into the rectum where a thorough examination including retroflexion was performed. <<PROCEDUREIMAGES>>  FINDINGS:  good prep. Normal rectum aside from surgical anastomosis at 15 cm. Residual distal colonic diverticula involving 30-40 cm of the residual distal colon. At the anastomosis, a blind pouch extended to approximately 10-15 cm. There were diverticula in the pouch as well. There was a single 4 mm polyp in the pouch.  The remainder of the residual  colonic mucosa appeared normal. There was no blood or suspect lesion in the lower GI tract. The distal 10 cm of terminal ileal mucosa appeared normal as well.  THERAPEUTIC / DIAGNOSTIC MANEUVERS PERFORMED:   The single diminutive polyp at the anastomosis was removed with cold snare technique.  COMPLICATIONS:  none  CECAL WITHDRAWAL TIME:  19 minutes  IMPRESSION:  Colonic diverticulosis. Findings consistent with prior partial colon resection. Residual diverticulosis. Small polyp-removed                   as described above. Etiology of hematochezia not found RECOMMENDATIONS:   See EGD  report.  Follow up on pathology  ______________________________ R. Roetta Sessions, MD Caleen Essex  CC:  n. eSIGNED:   R. Roetta Sessions at 01/29/2012 09:03 AM  Atilano Median, [phone]

## 2012-01-30 ENCOUNTER — Telehealth: Payer: Self-pay | Admitting: Internal Medicine

## 2012-01-30 NOTE — Telephone Encounter (Signed)
Patient was [facility] from hospital yesterday and has questions regarding her procedure she had and her results. Please call her back

## 2012-01-30 NOTE — Telephone Encounter (Signed)
Spoke with pt and answered all her questions

## 2012-02-05 ENCOUNTER — Encounter: Payer: Self-pay | Admitting: Adult Health

## 2012-02-05 ENCOUNTER — Ambulatory Visit (INDEPENDENT_AMBULATORY_CARE_PROVIDER_SITE_OTHER): Payer: Medicare Other | Admitting: Adult Health

## 2012-02-05 ENCOUNTER — Ambulatory Visit: Payer: Medicare Other | Admitting: Family Medicine

## 2012-02-05 ENCOUNTER — Encounter: Payer: Medicare Other | Admitting: Adult Health

## 2012-02-05 VITALS — BP 138/73 | HR 96 | Ht 63.0 in | Wt 123.0 lb

## 2012-02-05 DIAGNOSIS — K219 Gastro-esophageal reflux disease without esophagitis: Secondary | ICD-10-CM | POA: Diagnosis not present

## 2012-02-05 DIAGNOSIS — I10 Essential (primary) hypertension: Secondary | ICD-10-CM | POA: Diagnosis not present

## 2012-02-05 NOTE — Assessment & Plan Note (Signed)
She will need to follow-up with Dr.Rourke in the office for answers to questions concerning GI issues. I have given her a copy of her path report that showed only gastritis. She is to have capsule study when it can be arranged. Will defer to them for further testing.

## 2012-02-05 NOTE — Assessment & Plan Note (Signed)
Blood pressure is well controlled on present medications. She is unhappy with her medications as she thinks that her palpitations are worse on them instead of better. She is on metoprolol 12.5 mg BID and diltiazem 120 mg daily. I will not change any medications. I have tried to explain to her the need for both medications and answered several rapid fire questions,  but she left the room stating that she had another appointment.  She will follow-up with [name] in one month. It is my hope that he can convince her to take her medications as directed and continue to do so until next appointment.

## 2012-02-05 NOTE — Progress Notes (Signed)
HPI: [name] is a difficult and anxiety prone 61 y/o patient of [name] who we are following post hospitalization where she was admitted for GIB. She was seen by Dr. Kendell Bane and had an EGD and colonoscopy without evidence of active bleeding. 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 Echo showed grade I diastolic dysfunction. She left AMA after testing because she had animals at home that needed her care. She is very anxious about the results of the CT scan along with questions about the GI pathology reports. She goes on to describe a horrific experience with her EGD sedation mediations. She continues to have intermittent chest discomfort and occasional leg pain. She continues to smoke heavily.  Allergies  Allergen Reactions  . Aspirin Hives and Itching  . Tramadol Hcl Other (See Comments)    Lowers BP  . Willow Bark ([place]) Hives, Itching and Swelling    Requires EPI PEN. Swelling of throat, tongue.   Baruch Merl Leaf Swallow Wort Rhizome Hives, Itching and Swelling    Requires EPI PEN, Welling of throat, tongue.  . Benadryl (Altaryl) Itching    Makes patient fell hyper.  . Codeine     Patient also states she is allergic to steroids.  . Prednisone     All steroids  . Phenergan Anxiety    Current Outpatient Prescriptions  Medication Sig Dispense Refill  . calcium carbonate (OS-CAL) 600 MG TABS Take 600 mg by mouth daily.        . cetirizine (ZYRTEC) 10 MG tablet Take 10 mg by mouth daily. As  Needed for hives       . clopidogrel (PLAVIX) 75 MG tablet Take 0.5 tablets (37.5 mg total) by mouth every morning. RESTART THIS MEDICATION ON MARCH 23RD 2013. One half daily per patient      . cyclobenzaprine (FLEXERIL) 10 MG tablet Take 10 mg by mouth 3 (three) times daily as needed.      . diltiazem (CARDIZEM CD) 120 MG 24 hr capsule Take 120 mg by mouth every  morning. For blood pressure      . EPINEPHrine (EPIPEN 2-PAK) 0.3 MG/0.3ML DEVI Inject 0.3 mg into the muscle once. For an acute severe allergic reaction      . ezetimibe (ZETIA) 10 MG tablet       . fentaNYL (DURAGESIC - DOSED MCG/HR) 12 MCG/HR Place 1 patch onto the skin every third day. Apply 1 patch every 3 days and remove the old patch       . HYDROcodone-acetaminophen (LORTAB) 7.5-500 MG per tablet Take 1 tablet by mouth every 6 (six) hours as needed. FOR PAIN      . metoprolol tartrate (LOPRESSOR) 25 MG tablet Take 0.5 tablets (12.5 mg total) by mouth 2 (two) times daily.  90 tablet  3  . Multiple Vitamins-Minerals (MULTIVITAMIN) tablet Take 1 tablet by mouth daily.      Marland Kitchen neomycin-bacitracin-polymyxin (POLYSPORIN) ophthalmic ointment Place into both eyes as needed.        . pantoprazole (PROTONIX) 40 MG tablet Take 1 tablet (40 mg total) by mouth daily. For stomach ulcerations.  30 tablet  2  . ZOCOR 40 MG tablet TAKE 1 TABLET BY MOUTH ONCE DAILY.  30 each  3  . DISCONTD: amLODipine (NORVASC) 5 MG tablet Take 5 mg by mouth daily.          Past Medical History  Diagnosis Date  . Hepatitis C 1993     Needs Hepatic panel every 6   months, treated for  1 year   . Hyperlipidemia 2001  . HEARING LOSS     since age 11  . Hypertension 2001  . Sleep apnea 2001    non compliant wit the use of the machine  . Tinnitus 2006    disabling  . Fracture 2006    left foot & ankle , immobilized for healing   . Diverticulosis 2008    diagnosed   . Panic disorder     was followed by mental health  . Diverticulitis     pt reports 8 times. Dr. Leticia Penna colectomy in 2009  . Hiatal hernia     Per medical history form dated 05/02/11. Repaired.  . Night sweats     Per medical history form dated 05/02/11.  Marland Kitchen SOB (shortness of breath)     Per medical history form dated 05/02/11.  . Wears glasses   . Arthritis     Per medical history form dated 05/02/11.  . Menopause     per medical history form  .  Generalized headaches     Patient stated they are felt in back of the head, not throbing. But always in same spot. MRI's done, no reason why they occur.  . Gout     Recently diagnosed.  . Wears dentures     Per medical history form dated 05/02/11.  . Anemia due to blood loss, acute 01/28/2012  . Acute GI bleeding 01/28/2012  . 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 Diastolic dysfunction 01/28/2012    Grade 1    Past Surgical History  Procedure Date  . Tonsilllectomy 1955  . Vocal cord biopsy 2009    pt reports she had voice loss, reports that she had precancerous lesions on the throat   . Hiatal hernia repair 2003  . Appendectomy in teens   . Total abdominal hysterectomy w/ bilateral salpingoophorectomy March 2006    Non Cancerous   . Abdominal hernia repair x2 more   . Left elbow surgery 1999  . R ankle surgery x5 in 1993-1999  . Partial colectomy 2009    PT. REPORTS THAT SHE HAS HAD 8 INFECTIOS PREVO\IOUSLY WHICH REQUIRED SURGERY  . Umbilical hernis repair March 24,2010  . Hernia repair     incisional  . Colonoscopy Sept 2009    SLF: frequent sigmoid colon and descending colon diverticula, thickened walls in sigmoid, small internal hemorrhoids, colon polyp: hyperplastic, normal random biopsies  . Esophagogastroduodenoscopy March 2009    SLF: normal esophagus, gastric erosion, benign path    ZOX:WRUEAV of systems complete and found to be negative unless listed above\ PHYSICAL EXAM BP 138/73  Pulse 96  Ht 5\' 3"  (1.6 m)  Wt 123 lb (55.792 kg)  BMI 21.79 kg/m2  General: Well developed, well nourished, in no acute distress Head: Eyes PERRLA, No xanthomas.   Normal cephalic and atramatic  Lungs: Clear bilaterally to auscultation and percussion. Heart: HRRR S1 S2, without MRG.  Pulses are 2+ & equal.            No carotid bruit. No JVD.  No abdominal bruits. No femoral bruits. Abdomen: Bowel sounds are positive, abdomen soft and non-tender without masses  or                  Hernia's noted. Msk:  Back normal, normal gait. Normal  strength and tone for age. Extremities: No clubbing, cyanosis or edema.  DP +1 Neuro: Alert and oriented X 3. Psych:  Good affect, responds appropriately. Anxious.    ASSESSMENT AND PLAN

## 2012-02-07 ENCOUNTER — Encounter: Payer: Self-pay | Admitting: Internal Medicine

## 2012-02-09 ENCOUNTER — Telehealth: Payer: Self-pay | Admitting: Gastroenterology

## 2012-02-16 ENCOUNTER — Telehealth: Payer: Self-pay | Admitting: Gastroenterology

## 2012-02-16 ENCOUNTER — Ambulatory Visit (INDEPENDENT_AMBULATORY_CARE_PROVIDER_SITE_OTHER): Payer: Medicare Other | Admitting: Family Medicine

## 2012-02-16 ENCOUNTER — Telehealth: Payer: Self-pay | Admitting: Family Medicine

## 2012-02-16 ENCOUNTER — Encounter: Payer: Self-pay | Admitting: Family Medicine

## 2012-02-16 VITALS — BP 110/74 | HR 74 | Resp 16 | Ht 62.5 in | Wt 126.0 lb

## 2012-02-16 DIAGNOSIS — I10 Essential (primary) hypertension: Secondary | ICD-10-CM | POA: Diagnosis not present

## 2012-02-16 DIAGNOSIS — E785 Hyperlipidemia, unspecified: Secondary | ICD-10-CM | POA: Diagnosis not present

## 2012-02-16 DIAGNOSIS — K922 Gastrointestinal hemorrhage, unspecified: Secondary | ICD-10-CM | POA: Diagnosis not present

## 2012-02-16 NOTE — Telephone Encounter (Signed)
I received a referral from Dr Anthony Sar office to make OV for acute GI bleed. Patient is upset and said that [facility] had "dropped the ball" and no one has followed up on her since she was discharged from ICU. She does not want [facility], she wants [facility] and wants SF to call her about this ongoing issue. I apologized to the patient and voiced understanding of her concerns. She is unsure if there is any need of making OV and there would probably be no evidence of why she had a GI bleed. Please advise and call patient back ASAP.

## 2012-02-16 NOTE — Assessment & Plan Note (Signed)
Controlled, no change in medication  

## 2012-02-16 NOTE — Telephone Encounter (Signed)
No answer and left message on machine for patient to call our office to set up OV where Dr Lodema Hong had referred her to [place].

## 2012-02-16 NOTE — Progress Notes (Signed)
  Subjective:    Patient ID: [name], female    DOB: Aug 01, 1951, 61 y.o.   MRN: [id number]  HPI Pt in for follow up of recent hospitalization for acute GI bleed of unknown etiology. She still has the capsule study outstanding, and she  Has refused the days originally offered through her local GI doc stating she just cannot get to [place] on those days.Her local Doc later called after she was referred , stating that she is discharging the pt because it is difficult for her to follow through with proposed tests. I have made the pat aware and she is opting to change to GI nearer to where she lives.She also had questions about bacterial infection in her stomach states she was told this by Dr Sherrie Mustache, however at this time no report on H pylori disease is available to me . Pt also had specific c/o rectal exam in the Ed by a solo person , she is not clear who and wanted to know if I could access info on this.I stated I saw no record of guaiac testing in the Ed and would not be able to provide that info Also stated that when she was in the holding area for endoscopies she receiver medication she was "allergic to" felt tingling in her skin and as though she should scream. Of note today ,she specifically requested a letter recommending that her pCP remain here in [place] with me, expressed "lack of trust" she had obtained previously at the nearer clinic which she is being recommended to go to. No further rectal bleeding since d/c. She showed a picture of a commode with bright red blood which she had stored in her phonne from when she was hospitalized   Review of Systems See HPI Denies recent fever or chills. Denies sinus pressure, nasal congestion, ear pain or sore throat. Denies chest congestion, productive cough or wheezing. Denies chest pains, palpitations and leg swelling Denies abdominal pain, nausea, vomiting,diarrhea or constipation.   Denies dysuria, frequency, hesitancy or  incontinence. Denies joint pain, swelling and limitation in mobility. Denies headaches, seizures, numbness, or tingling. Denies depression,reports  Anxiety over her recent bleeding episode. She has a partial colectomy for diverticular disease. Denies skin break down or rash.        Objective:   Physical Exam  Patient alert and oriented and in no cardiopulmonary distress.  HEENT: No facial asymmetry, EOMI, no sinus tenderness,  oropharynx pink and moist.  Neck supple no adenopathy.  Chest: Clear to auscultation bilaterally.  CVS: S1, S2 no murmurs, no S3.  ABD: Soft non tender. Bowel sounds normal.  Ext: No edema  MS: Adequate ROM spine, shoulders, hips and knees.  Skin: Intact, no ulcerations or rash noted.  Psych: Good eye contact, normal affect. Memory intact  anxious not  depressed appearing.  CNS: CN 2-12 intact, power, tone and sensation normal throughout.       Assessment & Plan:

## 2012-02-16 NOTE — Telephone Encounter (Signed)
We have tried to work with her schedule. Pt has been demanding and inflexible. Spoke with Dr. Lodema Hong. I feel it's best to discharge pt from the practice because we  Have a nonproductive interaction. We will continue to care for her within the next 30 days and would be happy to refer her to another GI practice.

## 2012-02-16 NOTE — Patient Instructions (Addendum)
F/u in 3.5 month   Please start one iron tablet 65mg  to 325 mg once daily, since you had recent blood loss.   Fasting lipid and cmp and cbc in 3.5 month   Please ensure you get the f/u mammogram in May when due.  You will be referred to Dr fields for office f/u of your recent hospitalization due to rectal bleeding  You will be handed a letter recommending f/u of general medical care in [place] per your request

## 2012-02-16 NOTE — Telephone Encounter (Signed)
I received a call from Dr Darrick Penna earlier today explaining that pt is being discharged from the practice but that she is welcome to do the capsule study within the next 30 days through that office. [name] states she will not do the study in [place], and wants to be referred to GI in [place]. Please refer her to GI doc in [place] to evaluate GI bleed of unknown cause, requiring recent hospitaliztion. Her hosp d/c summary can be sent if needed.  Call pt with appt info please

## 2012-02-16 NOTE — Telephone Encounter (Signed)
D/C letter mailed. 

## 2012-02-16 NOTE — Telephone Encounter (Signed)
Called pt to have a GIVENS study on 04/04 but when I called pt to inform her of this, she stated she could keep that appt. She only had transportation to [place] on MWF from 9 to 11:30- Pt then stated that she felt like [facility] did not care about her of finding out why she had been bleeding . She stated that this procedure should have been done while she was an inpatient and could not understand why it was not done then. I tried to assure her that we did care about what was happening and offered to see if I could get her GIVENS scheduled to accomodate her but I would have to call her back. Ms Odonell stated she was not sure what she was going to do. She kept repeating that this should have been taken care while she was in the hospital because now it just seemed pointless and hung up. I called Kim in ENDO and asked about the pt having the GIVENS on those specific days and required times- and she informed me that was long as the DR was okay with the pt keeping the film and extra day and understood the film could be damaged, then it would be ok to schedule- I talked with Dr Darrick Penna and after reviewing the patient's chart it was discovered to be her pt and not RMR. Dr Darrick Penna stated she wanted the pt to come in for a non-urgent OV with her to discuss the need for the GIVENS and try to help with the pts concerns about her care. Tried to call pt back and advise of plan but no answer.

## 2012-02-17 ENCOUNTER — Telehealth: Payer: Self-pay | Admitting: Family Medicine

## 2012-02-17 NOTE — Telephone Encounter (Signed)
noted 

## 2012-02-20 ENCOUNTER — Encounter: Payer: Medicare Other | Admitting: Cardiology

## 2012-03-01 DIAGNOSIS — Z86010 Personal history of colonic polyps: Secondary | ICD-10-CM | POA: Diagnosis not present

## 2012-03-01 DIAGNOSIS — K922 Gastrointestinal hemorrhage, unspecified: Secondary | ICD-10-CM | POA: Diagnosis not present

## 2012-03-01 DIAGNOSIS — K5909 Other constipation: Secondary | ICD-10-CM | POA: Diagnosis not present

## 2012-03-01 DIAGNOSIS — K2960 Other gastritis without bleeding: Secondary | ICD-10-CM | POA: Diagnosis not present

## 2012-03-02 ENCOUNTER — Other Ambulatory Visit: Payer: Self-pay | Admitting: Family Medicine

## 2012-03-02 DIAGNOSIS — Z139 Encounter for screening, unspecified: Secondary | ICD-10-CM

## 2012-03-04 ENCOUNTER — Telehealth: Payer: Self-pay | Admitting: Family Medicine

## 2012-03-08 NOTE — Telephone Encounter (Signed)
Pt has called in requesting overnight pulse ox imetry. She is a smoker. States in the hospital she was awakened periodically stating her oxygen was low so she should breathe.  Please see if [facility] or [facility] can do overnight sleep study on her to see if she qualifies, I am writing order for test on script pls fax

## 2012-03-10 DIAGNOSIS — R1013 Epigastric pain: Secondary | ICD-10-CM | POA: Diagnosis not present

## 2012-03-10 DIAGNOSIS — K922 Gastrointestinal hemorrhage, unspecified: Secondary | ICD-10-CM | POA: Diagnosis not present

## 2012-03-10 DIAGNOSIS — B1920 Unspecified viral hepatitis C without hepatic coma: Secondary | ICD-10-CM | POA: Diagnosis not present

## 2012-03-10 DIAGNOSIS — J9819 Other pulmonary collapse: Secondary | ICD-10-CM | POA: Diagnosis not present

## 2012-03-10 DIAGNOSIS — K5730 Diverticulosis of large intestine without perforation or abscess without bleeding: Secondary | ICD-10-CM | POA: Diagnosis not present

## 2012-03-11 DIAGNOSIS — K922 Gastrointestinal hemorrhage, unspecified: Secondary | ICD-10-CM | POA: Diagnosis not present

## 2012-03-12 DIAGNOSIS — Z79899 Other long term (current) drug therapy: Secondary | ICD-10-CM | POA: Diagnosis not present

## 2012-03-12 DIAGNOSIS — M79609 Pain in unspecified limb: Secondary | ICD-10-CM | POA: Diagnosis not present

## 2012-03-12 DIAGNOSIS — F17200 Nicotine dependence, unspecified, uncomplicated: Secondary | ICD-10-CM | POA: Diagnosis not present

## 2012-03-12 DIAGNOSIS — M62838 Other muscle spasm: Secondary | ICD-10-CM | POA: Diagnosis not present

## 2012-03-12 DIAGNOSIS — M1990 Unspecified osteoarthritis, unspecified site: Secondary | ICD-10-CM | POA: Diagnosis not present

## 2012-03-12 NOTE — Telephone Encounter (Signed)
rx sent to [facility] 

## 2012-03-14 DIAGNOSIS — J449 Chronic obstructive pulmonary disease, unspecified: Secondary | ICD-10-CM | POA: Diagnosis not present

## 2012-03-15 ENCOUNTER — Telehealth: Payer: Self-pay | Admitting: Family Medicine

## 2012-03-15 NOTE — Telephone Encounter (Signed)
Please let her know she does qualify for supplemental oxygen for use while asleep, I am writing  Script pls fax after you fax

## 2012-03-16 NOTE — Telephone Encounter (Signed)
Faxed in and pt aware

## 2012-03-22 ENCOUNTER — Encounter: Payer: Self-pay | Admitting: Family Medicine

## 2012-03-26 ENCOUNTER — Ambulatory Visit (INDEPENDENT_AMBULATORY_CARE_PROVIDER_SITE_OTHER): Payer: Medicare Other | Admitting: Family Medicine

## 2012-03-26 ENCOUNTER — Encounter: Payer: Self-pay | Admitting: Family Medicine

## 2012-03-26 VITALS — BP 112/60 | HR 71 | Resp 16 | Ht 62.5 in | Wt 124.0 lb

## 2012-03-26 DIAGNOSIS — R0902 Hypoxemia: Secondary | ICD-10-CM

## 2012-03-26 DIAGNOSIS — J020 Streptococcal pharyngitis: Secondary | ICD-10-CM

## 2012-03-26 DIAGNOSIS — G4734 Idiopathic sleep related nonobstructive alveolar hypoventilation: Secondary | ICD-10-CM

## 2012-03-26 MED ORDER — AMOXICILLIN 500 MG PO CAPS: 500.0000 mg | ORAL_CAPSULE | Freq: Three times a day (TID) | ORAL | Status: AC

## 2012-03-26 NOTE — Progress Notes (Signed)
  Subjective:    Patient ID: [name], female    DOB: 1951-07-17, 61 y.o.   MRN: [id number]  HPI Sore throat and left ear pain for the past week. She started oxygen 3 days prior and felt very dry at night time. Therefore she discontinued the oxygen. Since then she has a pain behind her right ear and a sore throat. Denies cough, denies fever, denies chills denies rash. No over-the-counter medications taken. No other new medications.   Review of Systems - per above   GEN- denies fatigue, fever, weight loss,weakness, recent illness HEENT- denies eye drainage, change in vision, nasal discharge, CVS- denies chest pain, palpitations RESP- denies SOB, cough, wheeze ABD- denies N/V, change in stools, abd pain Neuro- denies headache, dizziness, syncope, seizure activity       Objective:   Physical Exam GEN- NAD, alert and oriented x3,smells of tobacco  HEENT- PERRL, EOMI, non injected sclera, pink conjunctiva, MMM, oropharynx injected-mild, TM clear bilat no effusion  Neck- Supple, no LAD CVS- RRR, no murmur RESP-CTAB EXT- No edema Pulses- Radial, DP- 2+        Assessment & Plan:

## 2012-03-26 NOTE — Patient Instructions (Addendum)
Gargle with warm salt water or try chloroseptic spray Restart oxygen after we get a humidifier on board  IF you develop fever please call back  Antibiotics for strep throat  Keep previous f/u with Dr.Simpson

## 2012-03-28 DIAGNOSIS — J020 Streptococcal pharyngitis: Secondary | ICD-10-CM | POA: Insufficient documentation

## 2012-03-28 DIAGNOSIS — R0902 Hypoxemia: Secondary | ICD-10-CM | POA: Insufficient documentation

## 2012-03-28 DIAGNOSIS — Z9981 Dependence on supplemental oxygen: Secondary | ICD-10-CM | POA: Insufficient documentation

## 2012-03-28 NOTE — Assessment & Plan Note (Signed)
Started on oxygen by PCP , encouraged pt to restart, humidifier to be used

## 2012-03-28 NOTE — Assessment & Plan Note (Signed)
+  strep swab, will treat with antibiotics

## 2012-03-29 ENCOUNTER — Telehealth: Payer: Self-pay | Admitting: Family Medicine

## 2012-03-29 DIAGNOSIS — B182 Chronic viral hepatitis C: Secondary | ICD-10-CM | POA: Diagnosis not present

## 2012-03-29 DIAGNOSIS — R10816 Epigastric abdominal tenderness: Secondary | ICD-10-CM | POA: Diagnosis not present

## 2012-03-29 DIAGNOSIS — K922 Gastrointestinal hemorrhage, unspecified: Secondary | ICD-10-CM | POA: Diagnosis not present

## 2012-03-30 NOTE — Telephone Encounter (Signed)
Patient aware.

## 2012-03-30 NOTE — Telephone Encounter (Signed)
Please tell her to complete the antibiotics for the strep throat, she can take probiotics or eat yogurt as well this will not cause any harm.  Signs of C diff are stomach pain with  diarrhea, if she develops that she needs to call us.

## 2012-03-30 NOTE — Telephone Encounter (Signed)
I didn't really understand what she was talking about. Something about she went to her GI and had some tests done regarding the rectal bleeding episode she had and they haven't gotten the results back yet but he told her that since she has had C diff before that she was at greater risk of getting it again and she told him about the antibiotics she was on for strep and he said she shouldn't be on that but using probiotics? That is what pt states. Wanted me to give you the message and let her know if she needs to stop them

## 2012-04-02 ENCOUNTER — Telehealth: Payer: Self-pay

## 2012-04-02 NOTE — Telephone Encounter (Signed)
Please let her know this is not coming from her pets, she had a positive strep screen, she needs to wash her hand if sneezing or coughing, she can disinfect doornobs etc, she does not need another antibiotic, she needs to complete her current course unless she is having severe diarrhea or abdominal pain.  Salt water gargles or she can get chloraseptic spray Make sure she keeps her f/u appt for recheck

## 2012-04-07 DIAGNOSIS — M79609 Pain in unspecified limb: Secondary | ICD-10-CM | POA: Diagnosis not present

## 2012-04-07 DIAGNOSIS — Z79899 Other long term (current) drug therapy: Secondary | ICD-10-CM | POA: Diagnosis not present

## 2012-04-07 DIAGNOSIS — F17200 Nicotine dependence, unspecified, uncomplicated: Secondary | ICD-10-CM | POA: Diagnosis not present

## 2012-04-07 DIAGNOSIS — M1990 Unspecified osteoarthritis, unspecified site: Secondary | ICD-10-CM | POA: Diagnosis not present

## 2012-04-07 DIAGNOSIS — M62838 Other muscle spasm: Secondary | ICD-10-CM | POA: Diagnosis not present

## 2012-04-09 ENCOUNTER — Ambulatory Visit (HOSPITAL_COMMUNITY)
Admission: RE | Admit: 2012-04-09 | Discharge: 2012-04-09 | Disposition: A | Discharge status: Home / Self Care | Payer: Medicare Other | Source: Ambulatory Visit | Attending: Family Medicine | Admitting: Family Medicine

## 2012-04-09 DIAGNOSIS — E785 Hyperlipidemia, unspecified: Secondary | ICD-10-CM | POA: Diagnosis not present

## 2012-04-09 DIAGNOSIS — Z1231 Encounter for screening mammogram for malignant neoplasm of breast: Secondary | ICD-10-CM | POA: Diagnosis not present

## 2012-04-09 DIAGNOSIS — K922 Gastrointestinal hemorrhage, unspecified: Secondary | ICD-10-CM | POA: Diagnosis not present

## 2012-04-09 DIAGNOSIS — Z139 Encounter for screening, unspecified: Secondary | ICD-10-CM

## 2012-04-09 LAB — COMPREHENSIVE METABOLIC PANEL
AST: 17 U/L (ref 0–37)
BUN: 13 mg/dL (ref 6–23)
Calcium: 9.6 mg/dL (ref 8.4–10.5)
Chloride: 105 mEq/L (ref 96–112)
Creat: 0.82 mg/dL (ref 0.50–1.10)

## 2012-04-09 LAB — CBC
HCT: 41.9 % (ref 36.0–46.0)
MCV: 91.1 fL (ref 78.0–100.0)
RDW: 14 % (ref 11.5–15.5)
WBC: 12.6 10*3/uL — ABNORMAL HIGH (ref 4.0–10.5)

## 2012-04-09 LAB — LIPID PANEL
Cholesterol: 163 mg/dL (ref 0–200)
HDL: 53 mg/dL (ref >39)
Triglycerides: 87 mg/dL (ref <150)

## 2012-04-09 NOTE — Telephone Encounter (Signed)
Pt in to office and notified.  Encouraged to make a sooner appt if she was still have problems.  Patient declined.

## 2012-04-14 ENCOUNTER — Other Ambulatory Visit: Payer: Self-pay | Admitting: Family Medicine

## 2012-04-23 ENCOUNTER — Encounter: Payer: Self-pay | Admitting: Family Medicine

## 2012-04-23 ENCOUNTER — Ambulatory Visit (INDEPENDENT_AMBULATORY_CARE_PROVIDER_SITE_OTHER): Payer: Medicare Other | Admitting: Family Medicine

## 2012-04-23 VITALS — BP 120/64 | HR 77 | Resp 18 | Ht 62.5 in | Wt 125.1 lb

## 2012-04-23 DIAGNOSIS — I10 Essential (primary) hypertension: Secondary | ICD-10-CM | POA: Diagnosis not present

## 2012-04-23 DIAGNOSIS — R0902 Hypoxemia: Secondary | ICD-10-CM | POA: Diagnosis not present

## 2012-04-23 DIAGNOSIS — G4734 Idiopathic sleep related nonobstructive alveolar hypoventilation: Secondary | ICD-10-CM

## 2012-04-23 DIAGNOSIS — D72829 Elevated white blood cell count, unspecified: Secondary | ICD-10-CM | POA: Diagnosis not present

## 2012-04-23 DIAGNOSIS — E785 Hyperlipidemia, unspecified: Secondary | ICD-10-CM

## 2012-04-23 DIAGNOSIS — F17200 Nicotine dependence, unspecified, uncomplicated: Secondary | ICD-10-CM

## 2012-04-23 DIAGNOSIS — R5383 Other fatigue: Secondary | ICD-10-CM

## 2012-04-23 DIAGNOSIS — R5381 Other malaise: Secondary | ICD-10-CM

## 2012-04-23 NOTE — Progress Notes (Signed)
  Subjective:    Patient ID: [name], female    DOB: 01-31-1951, 61 y.o.   MRN: [id number]  HPI Pt states she is experiencing a lot of fatigue , feels as though she is fading away ever since she had her blood loss in March, so far GI has been unable to determine the cause, states she had a negative report from a capsule study done in  [place], states she has been told by the GI Doc there that if she starts having an intestinal bleed again, she should go directly to [facility] or [facility]. I advised she go to the nearest hospital, and they would arrange a transfer, if she was too weak/unstable to go that far. She has  major concerns about costs of lab tests and the fact that medicare is stating they will not re imburse many. She asked about hep C viral load testing, was surprised that this had never been ordered by me, and states she had been treated in the past for hep C and her previous PCP used to follow this. I advised that I refer hep C pts to GI to follow , and that she should have lab work done and I would be happy to refer her. She had and still reports that she is in remission. Still smoking , no quit date set , but cutting back   Review of Systems See HPI Denies recent fever or chills.c/o fatigue Denies sinus pressure, nasal congestion,  or sore throat. Denies chest congestion, productive cough or wheezing. Denies chest pains, palpitations and leg swelling .   Denies dysuria, frequency, hesitancy or incontinence. Denies joint pain, swelling and limitation in mobility. Denies seizures, numbness, or tingling. Denies depression,uncontrolled  anxiety or insomnia. Denies skin break down or rash.        Objective:   Physical Exam Patient alert and oriented and in no cardiopulmonary distress.  HEENT: No facial asymmetry, EOMI, no sinus tenderness,  oropharynx pink and moist.  Neck supple no adenopathy.  Chest: Clear to auscultation bilaterally.Decreased air entry throughout  CVS:  S1, S2 no murmurs, no S3.  ABD: Soft non tender.   Ext: No edema  MS: Adequate ROM spine, shoulders, hips and knees.  Skin: Intact, no ulcerations or rash noted.  Psych: Good eye contact, normal affect. Memory intact, mildly  anxious not  depressed appearing.  CNS: CN 2-12 intact, power normal throughout.        Assessment & Plan:

## 2012-04-23 NOTE — Patient Instructions (Addendum)
F/u in 4 month, please call if you need me before  We will send most recent lab result to Dr Mariel Sleet and see if he can use that result to determine your future f/u needs, I doubt this will be the case, he needed a smear for personal review. You need to call the hematology/oncology office next 2 weeks to see what you need to do about this  I recommend that you get hep C  viral load, as you have had in the past reportedly, and get f/u with Dr Karilyn Cota regarding your hepatitis C status  Please continue to cut back on smoking, this will improve your health and exercise tolerance.   Please think about quitting smoking.  This is very important for your health.  Consider setting a quit date, then cutting back or switching brands to prepare to stop.  Also think of the money you will save every day by not smoking.  Quick Tips to Quit Smoking: Fix a date i.e. keep a date in mind from when you would not touch a tobacco product to smoke  Keep yourself busy and block your mind with work loads or reading books or watching movies in malls where smoking is not allowed  Vanish off the things which reminds you about smoking for example match box, or your favorite lighter, or the pipe you used for smoking, or your favorite jeans and shirt with which you used to enjoy smoking, or the club where you used to do smoking  Try to avoid certain people places and incidences where and with whom smoking is a common factor to add on  Praise yourself with some token gifts from the money you saved by stopping smoking  Anti Smoking teams are there to help you. Join their programs  Anti-smoking Gums are there in many medical shops. Try them to quit smoking   Side-effects of Smoking: Disease caused by smoking cigarettes are emphysema, bronchitis, heart failures  Premature death  Cancer is the major side effect of smoking  Heart attacks and strokes are the quick effects of smoking causing sudden death  Some smokers lives end  up with limbs amputated  Breathing problem or fast breathing is another side effect of smoking  Due to more intakes of smokes, carbon mono-oxide goes into your brain and other muscles of the body which leads to swelling of the veins and blockage to the air passage to lungs  Carbon monoxide blocks blood vessels which leads to blockage in the flow of blood to different major body organs like heart lungs and thus leads to attacks and deaths  During pregnancy smoking is very harmful and leads to premature birth of the infant, spontaneous abortions, low weight of the infant during birth  Fat depositions to narrow and blocked blood vessels causing heart attacks  In many cases cigarette smoking caused infertility in men    Recent lab still showed slightly elevated WBC

## 2012-04-24 DIAGNOSIS — R5383 Other fatigue: Secondary | ICD-10-CM | POA: Insufficient documentation

## 2012-04-24 NOTE — Assessment & Plan Note (Signed)
Reports cutting back, no quit date set, counseled pt for 3 minutes about the need to quit to reduce risk of CV illness and cancer, also the fact that this would improve her sense of wellbeing and reduce fatigue

## 2012-04-24 NOTE — Assessment & Plan Note (Signed)
Pt on supplemental oxygen at night

## 2012-04-24 NOTE — Assessment & Plan Note (Signed)
Controlled, no change in medication  

## 2012-04-24 NOTE — Assessment & Plan Note (Signed)
Discussed with pt the need to f/u with hematology as previously arranged

## 2012-04-24 NOTE — Assessment & Plan Note (Signed)
Chronic complaint, but thinks this worsening. I reviewed the fact that her mammogram, GI studies and chest scan were all within the past 18 months and negative. She is no longer anemic from blood loss, and her TSh is normal. She needs to stop smoking. I believe she would also benefit from an SSRI , but she is not interested

## 2012-04-26 ENCOUNTER — Telehealth: Payer: Self-pay | Admitting: Family Medicine

## 2012-04-26 NOTE — Telephone Encounter (Signed)
Papers re faxed today. 

## 2012-04-28 ENCOUNTER — Other Ambulatory Visit (HOSPITAL_COMMUNITY): Payer: Medicare Other

## 2012-04-30 ENCOUNTER — Ambulatory Visit (HOSPITAL_COMMUNITY): Payer: Medicare Other | Admitting: Oncology

## 2012-05-03 DIAGNOSIS — M1990 Unspecified osteoarthritis, unspecified site: Secondary | ICD-10-CM | POA: Diagnosis not present

## 2012-05-03 DIAGNOSIS — M79609 Pain in unspecified limb: Secondary | ICD-10-CM | POA: Diagnosis not present

## 2012-05-03 DIAGNOSIS — F17200 Nicotine dependence, unspecified, uncomplicated: Secondary | ICD-10-CM | POA: Diagnosis not present

## 2012-05-03 DIAGNOSIS — Z79899 Other long term (current) drug therapy: Secondary | ICD-10-CM | POA: Diagnosis not present

## 2012-06-11 ENCOUNTER — Other Ambulatory Visit: Payer: Self-pay | Admitting: Family Medicine

## 2012-06-11 DIAGNOSIS — F17200 Nicotine dependence, unspecified, uncomplicated: Secondary | ICD-10-CM | POA: Diagnosis not present

## 2012-06-11 DIAGNOSIS — M1990 Unspecified osteoarthritis, unspecified site: Secondary | ICD-10-CM | POA: Diagnosis not present

## 2012-06-11 DIAGNOSIS — Z79899 Other long term (current) drug therapy: Secondary | ICD-10-CM | POA: Diagnosis not present

## 2012-06-11 DIAGNOSIS — M79609 Pain in unspecified limb: Secondary | ICD-10-CM | POA: Diagnosis not present

## 2012-06-14 ENCOUNTER — Ambulatory Visit: Payer: Medicare Other | Admitting: Family Medicine

## 2012-07-14 ENCOUNTER — Other Ambulatory Visit: Payer: Self-pay | Admitting: *Deleted

## 2012-07-14 ENCOUNTER — Other Ambulatory Visit: Payer: Self-pay | Admitting: Family Medicine

## 2012-07-14 DIAGNOSIS — Z79899 Other long term (current) drug therapy: Secondary | ICD-10-CM | POA: Diagnosis not present

## 2012-07-14 DIAGNOSIS — M1990 Unspecified osteoarthritis, unspecified site: Secondary | ICD-10-CM | POA: Diagnosis not present

## 2012-07-14 DIAGNOSIS — F17200 Nicotine dependence, unspecified, uncomplicated: Secondary | ICD-10-CM | POA: Diagnosis not present

## 2012-07-14 DIAGNOSIS — M79609 Pain in unspecified limb: Secondary | ICD-10-CM | POA: Diagnosis not present

## 2012-07-14 MED ORDER — METOPROLOL TARTRATE 25 MG PO TABS: 12.5000 mg | ORAL_TABLET | Freq: Two times a day (BID) | ORAL | Status: DC

## 2012-07-23 ENCOUNTER — Encounter: Payer: Self-pay | Admitting: Adult Health

## 2012-07-23 ENCOUNTER — Ambulatory Visit (INDEPENDENT_AMBULATORY_CARE_PROVIDER_SITE_OTHER): Payer: Medicare Other | Admitting: Adult Health

## 2012-07-23 VITALS — BP 130/62 | HR 80 | Ht 63.0 in | Wt 125.0 lb

## 2012-07-23 DIAGNOSIS — Z720 Tobacco use: Secondary | ICD-10-CM | POA: Insufficient documentation

## 2012-07-23 DIAGNOSIS — R002 Palpitations: Secondary | ICD-10-CM | POA: Diagnosis not present

## 2012-07-23 MED ORDER — DILTIAZEM HCL ER COATED BEADS 120 MG PO CP24: ORAL_CAPSULE | ORAL | Status: DC

## 2012-07-23 MED ORDER — METOPROLOL SUCCINATE ER 25 MG PO TB24: ORAL_TABLET | ORAL | Status: DC

## 2012-07-23 MED ORDER — DILTIAZEM HCL ER COATED BEADS 120 MG PO CP24: 120.0000 mg | ORAL_CAPSULE | Freq: Every day | ORAL | Status: DC

## 2012-07-23 NOTE — Patient Instructions (Addendum)
Your physician wants you to follow-up in: 9 months with [facility]. You will receive a reminder letter in the mail two months in advance. If you  don't receive a letter, please call our office to schedule the follow-up appointment.  Your physician has recommended you make the following change in your medication:   Toprol  (metoprolol succinate) 1 tablet daily  This is for your for you palpitations  Diltiazem is for your blood pressure

## 2012-07-23 NOTE — Progress Notes (Signed)
HPI: Mrs. Steger is a difficult and anxiety prone 61 year old patient of Dr.Nishan, we are following for palpitations and hypertension. 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 Echocardiogram revealed grade 1 diastolic dysfunction.   She comes today continued anxious, she has been weaned off the fentanyl patch by pain management. She complains of neuralgia-like symptoms in her hands and feet. She also complains of continued palpitations. She has been on metoprolol tartrate 12.5 mg twice a day, and Cardizem CD  120 mg daily. Unfortunately she has only been taking the metoprolol once a day and says it twice a day recently. She does complain of late afternoon palpitations and racing heart rate.  Allergies  Allergen Reactions  . Aspirin Hives and Itching  . Tramadol Hcl Other (See Comments)    Lowers BP  . Willow Bark ([place]) Hives, Itching and Swelling    Requires EPI PEN. Swelling of throat, tongue.   Baruch Merl Leaf Swallow Wort Rhizome Hives, Itching and Swelling    Requires EPI PEN, Welling of throat, tongue.  . Benadryl (Diphenhydramine Hcl) Itching    Makes patient fell hyper.  . Codeine     Patient also states she is allergic to steroids.  . Prednisone     All steroids  . Promethazine Hcl Anxiety    Current Outpatient Prescriptions  Medication Sig Dispense Refill  . calcium carbonate (OS-CAL) 600 MG TABS Take 600 mg by mouth daily.        . clopidogrel (PLAVIX) 75 MG tablet Take 0.5 tablets (37.5 mg total) by mouth every morning. RESTART THIS MEDICATION ON MARCH 23RD 2013. One half daily per patient      . cyclobenzaprine (FLEXERIL) 10 MG tablet Take 10 mg by mouth 3 (three) times daily as needed.      . diltiazem (CARDIZEM CD) 120 MG 24 hr capsule Take 120 mg by mouth every morning. For  blood pressure      . docusate sodium (COLACE) 100 MG capsule Take 100 mg by mouth 2 (two) times daily.      Marland Kitchen EPIPEN 2-PAK 0.3 MG/0.3ML DEVI USE AS DIRECTED  2 each  0  . HYDROcodone-acetaminophen (LORTAB) 7.5-500 MG per tablet Take 1 tablet by mouth every 6 (six) hours as needed. FOR PAIN      . Multiple Vitamins-Minerals (MULTIVITAMIN) tablet Take 1 tablet by mouth daily.      Marland Kitchen neomycin-bacitracin-polymyxin (POLYSPORIN) ophthalmic ointment Place into both eyes as needed.        . pantoprazole (PROTONIX) 40 MG tablet Take 1 tablet (40 mg total) by mouth daily. For stomach ulcerations.  30 tablet  2  . tiotropium (SPIRIVA) 18 MCG inhalation capsule Place 18 mcg into inhaler and inhale daily.      Marland Kitchen ZOCOR 40 MG tablet TAKE 1 TABLET BY MOUTH ONCE DAILY.  30 each  3  . ZYRTEC 10 MG tablet TAKE (1) TABLET BY MOUTH ONCE DAILY AS NEEDED FOR HIVES.  30 each  5  . ZETIA 10 MG tablet TAKE ONE TABLET BY MOUTH AT BEDTIME.  30 each  3  . DISCONTD: amLODipine (NORVASC) 5 MG tablet Take 5 mg by mouth daily.          Past Medical History  Diagnosis Date  . Hepatitis C 1993     Needs Hepatic panel every  6   months, treated for  1 year   . Hyperlipidemia 2001  . HEARING LOSS     since age 1  . Hypertension 2001  . Sleep apnea 2001    non compliant wit the use of the machine  . Tinnitus 2006    disabling  . Fracture 2006    left foot & ankle , immobilized for healing   . Diverticulosis 2008    diagnosed   . Panic disorder     was followed by mental health  . Diverticulitis     pt reports 8 times. Dr. Leticia Penna colectomy in 2009  . Hiatal hernia     Per medical history form dated 05/02/11. Repaired.  . Night sweats     Per medical history form dated 05/02/11.  Marland Kitchen SOB (shortness of breath)     Per medical history form dated 05/02/11.  . Wears glasses   . Arthritis     Per medical history form dated 05/02/11.  . Menopause     per medical history form  . Generalized headaches     Patient stated  they are felt in back of the head, not throbing. But always in same spot. MRI's done, no reason why they occur.  . Gout     Recently diagnosed.  . Wears dentures     Per medical history form dated 05/02/11.  . Anemia due to blood loss, acute 01/28/2012  . Acute GI bleeding 01/28/2012  . 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 Diastolic dysfunction 01/28/2012    Grade 1  . Sleep apnea     wear oxygen at bedtime.     Past Surgical History  Procedure Date  . Tonsilllectomy 1955  . Vocal cord biopsy 2009    pt reports she had voice loss, reports that she had precancerous lesions on the throat   . Hiatal hernia repair 2003  . Appendectomy in teens   . Total abdominal hysterectomy w/ bilateral salpingoophorectomy March 2006    Non Cancerous   . Abdominal hernia repair x2 more   . Left elbow surgery 1999  . R ankle surgery x5 in 1993-1999  . Partial colectomy 2009    PT. REPORTS THAT SHE HAS HAD 8 INFECTIOS PREVO\IOUSLY WHICH REQUIRED SURGERY  . Umbilical hernis repair March 24,2010  . Hernia repair     incisional  . Colonoscopy Sept 2009    SLF: frequent sigmoid colon and descending colon diverticula, thickened walls in sigmoid, small internal hemorrhoids, colon polyp: hyperplastic, normal random biopsies  . Esophagogastroduodenoscopy March 2009    SLF: normal esophagus, gastric erosion, benign path    JYN:WGNFAO of systems complete and found to be negative unless listed above  PHYSICAL EXAM BP 130/62  Pulse 80  Ht 5\' 3"  (1.6 m)  Wt 125 lb (56.7 kg)  BMI 22.14 kg/m2  General: Well developed, well nourished, in no acute distress, smelling of cigarettes.  Head: Eyes PERRLA, No xanthomas.   Normal cephalic and atramatic  Lungs: Clear bilaterally to auscultation and percussion. Heart: HRRR S1 S2, without MRG.  Pulses are 2+ & equal.            No carotid bruit. No JVD.  No abdominal bruits. No femoral bruits. Abdomen: Bowel sounds are positive, abdomen soft  and non-tender without masses or                  Hernia's noted. Msk:  Back normal,  normal gait. Normal strength and tone for age. Extremities: No clubbing, cyanosis or edema.  DP +1 Neuro: Alert and oriented X 3. Psych:  Good affect, responds appropriately  EKG: Sinus rhythm with marked sinus arrythmia rate of 80 bpm.  ASSESSMENT AND PLAN

## 2012-07-23 NOTE — Assessment & Plan Note (Signed)
She has long-standing tobacco abuse, despite counseling, she has no plans to stop at this time. She states this helps with her anxiety. I have asked her to please cut down and she is willing to try this. The where the risk factors associated with ongoing tobacco abuse.

## 2012-07-23 NOTE — Assessment & Plan Note (Signed)
Blood pressure has excellent control. She is doing well on the Cardizem CD 120 mg daily she continues to have palpitations however and I believe this may be related to the once a day dosing of the metoprolol should be taken twice a day administration.  I changed her metoprolol to once a day long-acting dosing metoprolol succinate 25 mg. She is requesting that we place on the prescription the reason for taking the medication so that the pharmacist will place this on the label. We will see her again in 9 months.

## 2012-08-13 DIAGNOSIS — M1990 Unspecified osteoarthritis, unspecified site: Secondary | ICD-10-CM | POA: Diagnosis not present

## 2012-08-13 DIAGNOSIS — Z79899 Other long term (current) drug therapy: Secondary | ICD-10-CM | POA: Diagnosis not present

## 2012-08-13 DIAGNOSIS — M79609 Pain in unspecified limb: Secondary | ICD-10-CM | POA: Diagnosis not present

## 2012-08-13 DIAGNOSIS — F17200 Nicotine dependence, unspecified, uncomplicated: Secondary | ICD-10-CM | POA: Diagnosis not present

## 2012-08-27 ENCOUNTER — Ambulatory Visit: Payer: Medicare Other | Admitting: Family Medicine

## 2012-08-30 ENCOUNTER — Ambulatory Visit: Payer: Medicare Other | Admitting: Family Medicine

## 2012-09-08 DIAGNOSIS — G894 Chronic pain syndrome: Secondary | ICD-10-CM | POA: Diagnosis not present

## 2012-09-08 DIAGNOSIS — Z79899 Other long term (current) drug therapy: Secondary | ICD-10-CM | POA: Diagnosis not present

## 2012-09-08 DIAGNOSIS — F17200 Nicotine dependence, unspecified, uncomplicated: Secondary | ICD-10-CM | POA: Diagnosis not present

## 2012-09-08 DIAGNOSIS — M1990 Unspecified osteoarthritis, unspecified site: Secondary | ICD-10-CM | POA: Diagnosis not present

## 2012-09-08 DIAGNOSIS — M79609 Pain in unspecified limb: Secondary | ICD-10-CM | POA: Diagnosis not present

## 2012-09-09 ENCOUNTER — Other Ambulatory Visit: Payer: Self-pay | Admitting: Family Medicine

## 2012-09-16 ENCOUNTER — Telehealth: Payer: Self-pay | Admitting: Family Medicine

## 2012-09-16 NOTE — Telephone Encounter (Signed)
Spoke with patient and letter sent on her behalf.

## 2012-09-20 ENCOUNTER — Encounter: Payer: Self-pay | Admitting: Family Medicine

## 2012-09-20 ENCOUNTER — Ambulatory Visit (INDEPENDENT_AMBULATORY_CARE_PROVIDER_SITE_OTHER): Payer: Medicare Other | Admitting: Family Medicine

## 2012-09-20 VITALS — BP 134/62 | HR 80 | Resp 18 | Ht 62.5 in | Wt 126.0 lb

## 2012-09-20 DIAGNOSIS — F17200 Nicotine dependence, unspecified, uncomplicated: Secondary | ICD-10-CM

## 2012-09-20 DIAGNOSIS — B1920 Unspecified viral hepatitis C without hepatic coma: Secondary | ICD-10-CM | POA: Diagnosis not present

## 2012-09-20 DIAGNOSIS — E785 Hyperlipidemia, unspecified: Secondary | ICD-10-CM | POA: Diagnosis not present

## 2012-09-20 DIAGNOSIS — M25579 Pain in unspecified ankle and joints of unspecified foot: Secondary | ICD-10-CM

## 2012-09-20 DIAGNOSIS — M25571 Pain in right ankle and joints of right foot: Secondary | ICD-10-CM

## 2012-09-20 DIAGNOSIS — H9190 Unspecified hearing loss, unspecified ear: Secondary | ICD-10-CM

## 2012-09-20 DIAGNOSIS — Z23 Encounter for immunization: Secondary | ICD-10-CM | POA: Diagnosis not present

## 2012-09-20 DIAGNOSIS — R002 Palpitations: Secondary | ICD-10-CM

## 2012-09-20 DIAGNOSIS — H6120 Impacted cerumen, unspecified ear: Secondary | ICD-10-CM

## 2012-09-20 DIAGNOSIS — B1710 Acute hepatitis C without hepatic coma: Secondary | ICD-10-CM

## 2012-09-20 DIAGNOSIS — B079 Viral wart, unspecified: Secondary | ICD-10-CM

## 2012-09-20 DIAGNOSIS — Z8619 Personal history of other infectious and parasitic diseases: Secondary | ICD-10-CM

## 2012-09-20 DIAGNOSIS — D699 Hemorrhagic condition, unspecified: Secondary | ICD-10-CM

## 2012-09-20 DIAGNOSIS — I10 Essential (primary) hypertension: Secondary | ICD-10-CM

## 2012-09-20 NOTE — Progress Notes (Signed)
  Subjective:    Patient ID: [name], female    DOB: [birth year], 61 y.o.   MRN: [id number]  HPI  Recurrent very painful wart which affects her aDL's on right index finger for several years worsening, picks it out but re grows.  2 other warts on same hand one on the thumb also irritating needs deerm eval. 5th finger wart no problem Priogressive difficulty  Hearing out of right ear  For past several month concern re wax  Pt states she had a spontaneous bleed under her left great toe on March 19 at the time she had a GI bleed of unclear etiology, which required hospitalization wants to be further checked  C/o increased swelling and pain in right ankle, should have had an xray of the right ankle at last visit, and she had recently tried to get off fentanyl,but neded to go back on it, concerned about progressive disease in the ankle Review of Systems See HPI Denies recent fever or chills. Denies sinus pressure, nasal congestion,  or sore throat. Denies chest congestion, productive cough or wheezing. Denies chest pains, palpitations and leg swelling Denies abdominal pain, nausea, vomiting,diarrhea or constipation.   Denies dysuria, frequency, hesitancy or incontinence.  Denies uncontrolled  depression, anxiety or insomnia.        Objective:   Physical Exam  Patient alert and oriented and in no cardiopulmonary distress.  HEENT: No facial asymmetry, EOMI, no sinus tenderness,  oropharynx pink and moist.  Neck supple no adenopathy.Right tM occluded by wax, successful ear irrigation in office  Chest: Clear to auscultation bilaterally.  CVS: S1, S2 no murmurs, no S3.  ABD: Soft non tender. Bowel sounds normal.  Ext: No edema  MS: Adequate ROM spine, shoulders, hips and knees.decreased  In ankle  Skin: Intact, no ulcerations or rash noted.Wats in hands, tender wart present on right hand  Psych: Good eye contact, normal affect. Memory intact not anxious or depressed  appearing.  CNS: CN 2-12 intact, power, tone and sensation normal throughout.       Assessment & Plan:

## 2012-09-20 NOTE — Assessment & Plan Note (Signed)
Dx  In 1993 treated in June 1999 to June 2000. Dr Kerby Nora at [facility], and rebitrol  Chemo three times weekly for 52 weeks

## 2012-09-20 NOTE — Patient Instructions (Addendum)
F/u in 4 month  You are referred for evaluation by ENT for hearing loss and right ear discomfort, appointments on Monday, Wednesday or Friday per your request  You are referred re hepatitis C follow up  You are referred to hematology to evaluate bleeding  Flu vaccine today.  Right ear irrigation today.  You are referred to dermatology re warts on right hand  An xray of your right ankle is already ordered, no appointment needed  For this  Fasting lipid and cmp are due now

## 2012-09-20 NOTE — Assessment & Plan Note (Signed)
Right ear successfully irrigated , no trauma, TM clear, pt reported improved hearing post procedure

## 2012-09-21 ENCOUNTER — Telehealth: Payer: Self-pay | Admitting: Family Medicine

## 2012-09-21 DIAGNOSIS — B079 Viral wart, unspecified: Secondary | ICD-10-CM | POA: Insufficient documentation

## 2012-09-21 DIAGNOSIS — D699 Hemorrhagic condition, unspecified: Secondary | ICD-10-CM | POA: Insufficient documentation

## 2012-09-21 DIAGNOSIS — H9190 Unspecified hearing loss, unspecified ear: Secondary | ICD-10-CM | POA: Insufficient documentation

## 2012-09-21 NOTE — Telephone Encounter (Signed)
Patient is aware 

## 2012-09-22 ENCOUNTER — Telehealth: Payer: Self-pay | Admitting: Family Medicine

## 2012-09-22 NOTE — Telephone Encounter (Signed)
Patient is aware 

## 2012-09-29 ENCOUNTER — Other Ambulatory Visit (INDEPENDENT_AMBULATORY_CARE_PROVIDER_SITE_OTHER): Payer: Self-pay | Admitting: Internal Medicine

## 2012-09-29 ENCOUNTER — Ambulatory Visit (INDEPENDENT_AMBULATORY_CARE_PROVIDER_SITE_OTHER): Payer: Medicare Other | Admitting: Internal Medicine

## 2012-09-29 ENCOUNTER — Ambulatory Visit (HOSPITAL_COMMUNITY)
Admission: RE | Admit: 2012-09-29 | Discharge: 2012-09-29 | Disposition: A | Discharge status: Home / Self Care | Payer: Medicare Other | Source: Ambulatory Visit | Attending: Family Medicine | Admitting: Family Medicine

## 2012-09-29 ENCOUNTER — Encounter (INDEPENDENT_AMBULATORY_CARE_PROVIDER_SITE_OTHER): Payer: Self-pay | Admitting: Internal Medicine

## 2012-09-29 ENCOUNTER — Telehealth (INDEPENDENT_AMBULATORY_CARE_PROVIDER_SITE_OTHER): Payer: Self-pay | Admitting: *Deleted

## 2012-09-29 VITALS — BP 104/50 | HR 70 | Temp 98.7°F | Ht 62.6 in | Wt 124.5 lb

## 2012-09-29 DIAGNOSIS — M25579 Pain in unspecified ankle and joints of unspecified foot: Secondary | ICD-10-CM | POA: Diagnosis not present

## 2012-09-29 DIAGNOSIS — E785 Hyperlipidemia, unspecified: Secondary | ICD-10-CM | POA: Diagnosis not present

## 2012-09-29 DIAGNOSIS — M19079 Primary osteoarthritis, unspecified ankle and foot: Secondary | ICD-10-CM | POA: Diagnosis not present

## 2012-09-29 DIAGNOSIS — B1920 Unspecified viral hepatitis C without hepatic coma: Secondary | ICD-10-CM

## 2012-09-29 DIAGNOSIS — B1710 Acute hepatitis C without hepatic coma: Secondary | ICD-10-CM | POA: Diagnosis not present

## 2012-09-29 DIAGNOSIS — M25571 Pain in right ankle and joints of right foot: Secondary | ICD-10-CM

## 2012-09-29 LAB — HEPATITIS C ANTIBODY: HCV Ab: REACTIVE — AB

## 2012-09-29 NOTE — Addendum Note (Signed)
Addended by: Len Blalock on: 09/29/2012 11:36 AM   Modules accepted: Orders

## 2012-09-29 NOTE — Patient Instructions (Addendum)
Labs for Hepatitis C. OV in 2 months.

## 2012-09-29 NOTE — Telephone Encounter (Signed)
Per Dorene Ar NP

## 2012-09-29 NOTE — Progress Notes (Addendum)
Subjective:     Patient ID: [name], female   DOB: 11-07-1951, 61 y.o.   MRN: [id number]  HPI Referred to our office by Dr. Lodema Hong for f/u of her Hepatitis C. She was diagnosed in 1993 with Hepatitis.  Risk factors for her include blood transfusion, unprotected sex, IV drug use, and tattoos. She received tx at [facility] with Dr. Kerby Nora in 1999-2000 for her Hep C.  She was treated with Rebetron and Intron A She took 52 weeks of treatment. She says her virus is undetectable.  Appetite is good. No weight loss. BMs are good. No GI problems. She had a liver biopsy at [facility].  Liver biopsy Grade 1. Stage 1  CMP     Component Value Date/Time   NA 141 09/29/2012 1118   K 4.9 09/29/2012 1118   CL 102 09/29/2012 1118   CO2 33* 09/29/2012 1118   GLUCOSE 91 09/29/2012 1118   BUN 11 09/29/2012 1118   CREATININE 0.61 09/29/2012 1118   CREATININE 0.58 01/29/2012 0533   CALCIUM 10.2 09/29/2012 1118   PROT 6.9 09/29/2012 1118   ALBUMIN 4.4 09/29/2012 1118   AST 19 09/29/2012 1118   ALT 16 09/29/2012 1118   ALKPHOS 60 09/29/2012 1118   BILITOT 0.3 09/29/2012 1118   GFRNONAA >90 01/29/2012 0533   GFRAA >90 01/29/2012 0533     Review of Systemssee hpi Current Outpatient Prescriptions  Medication Sig Dispense Refill  . calcium carbonate (OS-CAL) 600 MG TABS Take 600 mg by mouth daily.        . clopidogrel (PLAVIX) 75 MG tablet Take 0.5 tablets (37.5 mg total) by mouth every morning. RESTART THIS MEDICATION ON MARCH 23RD 2013. One half daily per patient      . cyclobenzaprine (FLEXERIL) 10 MG tablet Take 10 mg by mouth 3 (three) times daily as needed.      . diltiazem (CARDIZEM CD) 120 MG 24 hr capsule Take 1 capsule (120 mg total) by mouth daily. For blood pressure  30 capsule  11  . docusate sodium (COLACE) 100 MG capsule Take 100 mg by mouth 2 (two) times daily.      Marland Kitchen EPIPEN 2-PAK 0.3 MG/0.3ML DEVI USE AS DIRECTED  2 each  0  . fentaNYL (DURAGESIC - DOSED MCG/HR) 12 MCG/HR Place 1 patch  onto the skin every 3 (three) days.      Marland Kitchen HYDROcodone-acetaminophen (LORTAB) 7.5-500 MG per tablet Take 1 tablet by mouth every 6 (six) hours as needed. FOR PAIN      . metoprolol succinate (TOPROL-XL) 25 MG 24 hr tablet 1 tablet daily. for palpitations  30 tablet  11  . neomycin-bacitracin-polymyxin (POLYSPORIN) ophthalmic ointment Place into both eyes as needed.        . simvastatin (ZOCOR) 40 MG tablet TAKE 1 TABLET BY MOUTH ONCE DAILY FOR CHOLESTEROL.  30 tablet  3  . tiotropium (SPIRIVA) 18 MCG inhalation capsule Place 18 mcg into inhaler and inhale daily.      Marland Kitchen ZETIA 10 MG tablet TAKE ONE TABLET BY MOUTH AT BEDTIME.  30 each  3  . ZYRTEC 10 MG tablet TAKE (1) TABLET BY MOUTH ONCE DAILY AS NEEDED FOR HIVES.  30 each  5  . [DISCONTINUED] amLODipine (NORVASC) 5 MG tablet Take 5 mg by mouth daily.         Past Medical History  Diagnosis Date  . Hepatitis C 1993     Needs Hepatic panel every 6   months, treated for  1 year   . Hyperlipidemia 2001  . HEARING LOSS     since age 1  . Hypertension 2001  . Sleep apnea 2001    non compliant wit the use of the machine  . Tinnitus 2006    disabling  . Fracture 2006    left foot & ankle , immobilized for healing   . Diverticulosis 2008    diagnosed   . Panic disorder     was followed by mental health  . Diverticulitis     pt reports 8 times. Dr. Leticia Penna colectomy in 2009  . Hiatal hernia     Per medical history form dated 05/02/11. Repaired.  . Night sweats     Per medical history form dated 05/02/11.  Marland Kitchen SOB (shortness of breath)     Per medical history form dated 05/02/11.  . Wears glasses   . Arthritis     Per medical history form dated 05/02/11.  . Menopause     per medical history form  . Generalized headaches     Patient stated they are felt in back of the head, not throbing. But always in same spot. MRI's done, no reason why they occur.  . Gout     Recently diagnosed.  . Wears dentures     Per medical history form dated  05/02/11.  . Anemia due to blood loss, acute 01/28/2012  . Acute GI bleeding 01/28/2012  . 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 Diastolic dysfunction 01/28/2012    Grade 1  . Sleep apnea     wear oxygen at bedtime.    Past Surgical History  Procedure Date  . Tonsilllectomy 1955  . Vocal cord biopsy 2009    pt reports she had voice loss, reports that she had precancerous lesions on the throat   . Hiatal hernia repair 2003  . Appendectomy in teens   . Total abdominal hysterectomy w/ bilateral salpingoophorectomy March 2006    Non Cancerous   . Abdominal hernia repair x2 more   . Left elbow surgery 1999  . R ankle surgery x5 in 1993-1999  . Partial colectomy 2009    PT. REPORTS THAT SHE HAS HAD 8 INFECTIOS PREVO\IOUSLY WHICH REQUIRED SURGERY  . Umbilical hernis repair March 24,2010  . Hernia repair     incisional  . Colonoscopy Sept 2009    SLF: frequent sigmoid colon and descending colon diverticula, thickened walls in sigmoid, small internal hemorrhoids, colon polyp: hyperplastic, normal random biopsies  . Esophagogastroduodenoscopy March 2009    SLF: normal esophagus, gastric erosion, benign path   Allergies  Allergen Reactions  . Aspirin Hives and Itching  . Tramadol Hcl Other (See Comments)    Lowers BP  . Willow Bark ([place]) Hives, Itching and Swelling    Requires EPI PEN. Swelling of throat, tongue.   Baruch Merl Leaf Swallow Wort Rhizome Hives, Itching and Swelling    Requires EPI PEN, Welling of throat, tongue.  . Benadryl (Diphenhydramine Hcl) Itching    Makes patient fell hyper.  . Codeine     Patient also states she is allergic to steroids.  . Prednisone     All steroids  . Promethazine Hcl Anxiety        Objective:   Physical Exam  Filed Vitals:   09/29/12 0946  BP: 104/50  Pulse: 70  Temp: 98.7 F (37.1 C)  Height: 5' 2.6" (1.59 m)  Weight: 124 lb 8  oz (56.473 kg)  Alert and oriented. Skin warm and dry. Oral mucosa  is moist.   . Sclera anicteric, conjunctivae is pink. Thyroid not enlarged. No cervical lymphadenopathy. Lungs clear. Heart regular rate and rhythm.  Abdomen is soft. Bowel sounds are positive. No hepatomegaly. No abdominal masses felt. No tenderness.  No edema to lower extremities.        Assessment:    Hepatitis C diagnosed over 10 yrs ago. Tx and cleared virus. Needs follow up for her Hep C. Liver biopsy at [facility] years ago with Stage 1, Grade 1 I discussed this case with Dr. Karilyn Cota. She has normal liver enzymes.     Plan:    Hep C antibody, Hep C quantative, Hep C genotype, Cmet. OV in 1 yr.

## 2012-09-30 LAB — LIPID PANEL
HDL: 53 mg/dL (ref >39)
LDL Cholesterol: 109 mg/dL — ABNORMAL HIGH (ref 0–99)
Total CHOL/HDL Ratio: 3.6 Ratio
VLDL: 27 mg/dL (ref 0–40)

## 2012-09-30 LAB — COMPREHENSIVE METABOLIC PANEL
AST: 19 U/L (ref 0–37)
Albumin: 4.4 g/dL (ref 3.5–5.2)
Alkaline Phosphatase: 60 U/L (ref 39–117)
Potassium: 4.9 mEq/L (ref 3.5–5.3)
Sodium: 141 mEq/L (ref 135–145)
Total Bilirubin: 0.3 mg/dL (ref 0.3–1.2)
Total Protein: 6.9 g/dL (ref 6.0–8.3)

## 2012-09-30 LAB — HEPATITIS C RNA QUANTITATIVE

## 2012-10-01 NOTE — Assessment & Plan Note (Signed)
Reports deterioration in hearing and already uses a hearing aid, will refer for re eval by ENT

## 2012-10-01 NOTE — Assessment & Plan Note (Signed)
Painful warts in the han, refer to dermatology for further eval and treatment

## 2012-10-01 NOTE — Assessment & Plan Note (Signed)
Controlled, no change in medication DASH diet and commitment to daily physical activity for a minimum of 30 minutes discussed and encouraged, as a part of hypertension management. The importance of attaining a healthy weight is also discussed.  

## 2012-10-01 NOTE — Assessment & Plan Note (Signed)

## 2012-10-01 NOTE — Assessment & Plan Note (Signed)
Controlled, continue toprol

## 2012-10-04 ENCOUNTER — Ambulatory Visit (HOSPITAL_COMMUNITY): Payer: Medicare Other | Admitting: Oncology

## 2012-10-04 ENCOUNTER — Encounter: Payer: Self-pay | Admitting: Family Medicine

## 2012-10-05 LAB — HEPATITIS C GENOTYPE

## 2012-10-06 ENCOUNTER — Ambulatory Visit (HOSPITAL_COMMUNITY): Payer: Medicare Other | Admitting: Oncology

## 2012-10-11 DIAGNOSIS — K219 Gastro-esophageal reflux disease without esophagitis: Secondary | ICD-10-CM | POA: Diagnosis not present

## 2012-10-11 DIAGNOSIS — J370 Chronic laryngitis: Secondary | ICD-10-CM | POA: Diagnosis not present

## 2012-10-11 DIAGNOSIS — H905 Unspecified sensorineural hearing loss: Secondary | ICD-10-CM | POA: Diagnosis not present

## 2012-10-11 DIAGNOSIS — J383 Other diseases of vocal cords: Secondary | ICD-10-CM | POA: Diagnosis not present

## 2012-10-12 ENCOUNTER — Telehealth (INDEPENDENT_AMBULATORY_CARE_PROVIDER_SITE_OTHER): Payer: Self-pay | Admitting: Internal Medicine

## 2012-10-12 NOTE — Telephone Encounter (Signed)
[name], cancel office visit for 2 months.   She will need an Office visit in 1 yr. I have talked with patient.

## 2012-10-12 NOTE — Telephone Encounter (Signed)
No answer at home #

## 2012-10-13 ENCOUNTER — Encounter (INDEPENDENT_AMBULATORY_CARE_PROVIDER_SITE_OTHER): Payer: Self-pay

## 2012-10-13 DIAGNOSIS — G8929 Other chronic pain: Secondary | ICD-10-CM | POA: Diagnosis not present

## 2012-10-13 DIAGNOSIS — G894 Chronic pain syndrome: Secondary | ICD-10-CM | POA: Diagnosis not present

## 2012-10-13 DIAGNOSIS — M79609 Pain in unspecified limb: Secondary | ICD-10-CM | POA: Diagnosis not present

## 2012-10-13 DIAGNOSIS — M159 Polyosteoarthritis, unspecified: Secondary | ICD-10-CM | POA: Diagnosis not present

## 2012-10-13 DIAGNOSIS — Z79899 Other long term (current) drug therapy: Secondary | ICD-10-CM | POA: Diagnosis not present

## 2012-10-13 DIAGNOSIS — M1990 Unspecified osteoarthritis, unspecified site: Secondary | ICD-10-CM | POA: Diagnosis not present

## 2012-10-13 NOTE — Telephone Encounter (Signed)
1 yr f/u has been noted on the recall list 

## 2012-10-18 ENCOUNTER — Telehealth: Payer: Self-pay | Admitting: Family Medicine

## 2012-10-18 NOTE — Telephone Encounter (Signed)
Patient already sorted out the issue

## 2012-10-22 ENCOUNTER — Encounter (HOSPITAL_COMMUNITY): Payer: Medicare Other | Attending: Oncology | Admitting: Oncology

## 2012-10-22 VITALS — BP 122/74 | HR 73 | Temp 98.1°F | Resp 20 | Wt 127.0 lb

## 2012-10-22 DIAGNOSIS — D801 Nonfamilial hypogammaglobulinemia: Secondary | ICD-10-CM | POA: Diagnosis not present

## 2012-10-22 DIAGNOSIS — D72829 Elevated white blood cell count, unspecified: Secondary | ICD-10-CM

## 2012-10-22 DIAGNOSIS — B1920 Unspecified viral hepatitis C without hepatic coma: Secondary | ICD-10-CM | POA: Insufficient documentation

## 2012-10-22 DIAGNOSIS — K922 Gastrointestinal hemorrhage, unspecified: Secondary | ICD-10-CM | POA: Diagnosis not present

## 2012-10-22 DIAGNOSIS — F17200 Nicotine dependence, unspecified, uncomplicated: Secondary | ICD-10-CM | POA: Diagnosis not present

## 2012-10-22 DIAGNOSIS — D596 Hemoglobinuria due to hemolysis from other external causes: Secondary | ICD-10-CM | POA: Insufficient documentation

## 2012-10-22 DIAGNOSIS — G4730 Sleep apnea, unspecified: Secondary | ICD-10-CM | POA: Diagnosis not present

## 2012-10-22 LAB — CBC WITH DIFFERENTIAL/PLATELET
Eosinophils Absolute: 0.2 10*3/uL (ref 0.0–0.7)
Lymphocytes Relative: 20 % (ref 12–46)
Lymphs Abs: 2.5 10*3/uL (ref 0.7–4.0)
Neutro Abs: 8.6 10*3/uL — ABNORMAL HIGH (ref 1.7–7.7)
Neutrophils Relative %: 71 % (ref 43–77)
Platelets: 224 10*3/uL (ref 150–400)
RBC: 5.07 MIL/uL (ref 3.87–5.11)
WBC: 12.2 10*3/uL — ABNORMAL HIGH (ref 4.0–10.5)

## 2012-10-22 LAB — PLATELET FUNCTION ASSAY: Collagen / Epinephrine: 72 seconds (ref 0–197)

## 2012-10-22 LAB — APTT: aPTT: 28 seconds (ref 24–37)

## 2012-10-22 LAB — PROTIME-INR
INR: 0.88 (ref 0.00–1.49)
Prothrombin Time: 11.9 seconds (ref 11.6–15.2)

## 2012-10-22 NOTE — Patient Instructions (Addendum)
[facility] Discharge Instructions  RECOMMENDATIONS MADE BY THE CONSULTANT AND ANY TEST RESULTS WILL BE SENT TO YOUR REFERRING PHYSICIAN.  EXAM FINDINGS BY THE PHYSICIAN TODAY AND SIGNS OR SYMPTOMS TO REPORT TO CLINIC OR PRIMARY PHYSICIAN: Exam and discussion by PA.  We will check some blood work today and will contact you if there's anything that the MD is concerned about.  MEDICATIONS PRESCRIBED:  none     SPECIAL INSTRUCTIONS/FOLLOW-UP: Return in 6 months for bloodwork and to see MD.  Thank you for choosing [facility] to provide your oncology and hematology care.  To afford each patient quality time with our providers, please arrive at least 15 minutes before your scheduled appointment time.  With your help, our goal is to use those 15 minutes to complete the necessary work-up to ensure our physicians have the information they need to help with your evaluation and healthcare recommendations.    Effective January 1st, 2014, we ask that you re-schedule your appointment with our physicians should you arrive 10 or more minutes late for your appointment.  We strive to give you quality time with our providers, and arriving late affects you and other patients whose appointments are after yours.    Again, thank you for choosing [facility].  Our hope is that these requests will decrease the amount of time that you wait before being seen by our physicians.       _____________________________________________________________  I acknowledge that I have been informed and understand all the instructions given to me and received a copy. I do not have anymore questions at this time but understand that I may call the Cancer Center at [facility] at [phone] during business hours should I have any further questions or need assistance in obtaining follow-up care.    __________________________________________  _____________  __________ Signature of  Patient or Authorized Representative            Date                   Time    __________________________________________ Nurse's Signature

## 2012-10-22 NOTE — Progress Notes (Signed)
Amanda Overman, MD [address] [name] [address]  1. Leukocytosis  CBC with Differential  2. Acute GI bleeding  Protime-INR, APTT, Platelet function assay    CURRENT THERAPY: Observation  INTERVAL HISTORY: [name] 61 y.o. female returns for  regular  visit for followup of of her leukocytosis.    There is notation on my schedule that the patient's PCP was sending the patient back for evaluation of bleeding.  There is no notation in the patient's chart about bleeding other than an incident in March 2013 with rectal bleeding.  GI was consulted and is following.  The patient underwent an EGD and Colonoscopy on 01/29/2012 by Dr. Jena Gauss.  Colonoscopy revealed evidence of prior colon resection surgery with diverticulosis and EGD revealed prior fundoplication and antral erosions.  Biopsies were negative for malignancy.  I personally reviewed and went over pathology results with the patient.  The patient was under the impression that she is here for her yearly follow-up of her leukocytosis.  Therefore, there is a little confusion about her visit today, but we will investigate both issues mentioned above.   She denies any bleeding since her hospital admission in March.  Chart is reviewed in detail since our last encounter.    According to Loney Loh most recent note, the patient was treated in 1999-2000 for hepatitis C after being diagnosed in 1993 with Rebetron and Intron A x 52 weeks of treatment by Dr. Kerby Nora at [facility].   Recently, the patient reports, the person she believes she contracted the virus from called her and reported that he is Hep C positive and is not doing well.  So is proud that she is doing well from that standpoint.  She was seen by Aurther Loft who performed hepatitis blood work which was hep c positive, but undetectable.  The patient continues to smoke tobacco.  Smoking cessation education provided. A CT of chest in Dec 2012 was negative for nodules or  malignancy but did demonstrate emphysema.  I personally reviewed and went over radiographic studies with the patient.   She underwent a sleep study and the results of that revealed hypoxia during sleep and she was therefore placed on O2 at 2L at HS.   Mammogram was negative.   I personally reviewed and went over laboratory results with the patient.  Patient's platelet count is WNL with a normal Hgb level.  Her WBC count was elevated in May 2013 but she was treated and diagnosed with Strep throat at that time too.  In March 2013, WBC count was WNL.   Hematologic ROS questioning is negative.    Past Medical History  Diagnosis Date  . Hepatitis C 1993     Needs Hepatic panel every 6   months, treated for  1 year   . Hyperlipidemia 2001  . HEARING LOSS     since age 66  . Hypertension 2001  . Sleep apnea 2001    non compliant wit the use of the machine  . Tinnitus 2006    disabling  . Fracture 2006    left foot & ankle , immobilized for healing   . Diverticulosis 2008    diagnosed   . Panic disorder     was followed by mental health  . Diverticulitis     pt reports 8 times. Dr. Leticia Penna colectomy in 2009  . Hiatal hernia     Per medical history form dated 05/02/11. Repaired.  . Night sweats  Per medical history form dated 05/02/11.  Marland Kitchen SOB (shortness of breath)     Per medical history form dated 05/02/11.  . Wears glasses   . Arthritis     Per medical history form dated 05/02/11.  . Menopause     per medical history form  . Generalized headaches     Patient stated they are felt in back of the head, not throbing. But always in same spot. MRI's done, no reason why they occur.  . Gout     Recently diagnosed.  . Wears dentures     Per medical history form dated 05/02/11.  . Anemia due to blood loss, acute 01/28/2012  . Acute GI bleeding 01/28/2012  . 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 Diastolic dysfunction 01/28/2012    Grade 1  . Sleep apnea     wear  oxygen at bedtime.     has HEPATITIS C; GLUCOSE INTOLERANCE; HYPERLIPIDEMIA; ANXIETY STATE, UNSPECIFIED; ALCOHOL ABUSE, IN REMISSION; NICOTINE ADDICTION; ESSENTIAL HYPERTENSION; TIA; REACTIVE AIRWAY DISEASE; GERD; GASTRITIS; HIATAL HERNIA; DERMATITIS; INGROWN TOENAIL; URTICARIA; ARTHRITIS, CHRONIC; ARTHRITIS, RIGHT ANKLE; POLYARTHRITIS; NECK PAIN, CHRONIC; MYOSITIS; LEG PAIN, RIGHT; INSOMNIA UNSPECIFIED; SLEEP APNEA; FATIGUE; NUMBNESS, HAND; HEADACHE; Palpitations; CHEST PAIN UNSPECIFIED; DYSPHAGIA UNSPECIFIED; IMPAIRED FASTING GLUCOSE; ALLERGIC REACTION; HEPATITIS C, HX OF; HYPERTENSION, HX OF; SWELLING, MASS, OR LUMP IN CHEST; History of epistaxis; Hematochezia; Acute GI bleeding; Leukocytosis; Aortic mural thrombus; Diverticulosis; Strep pharyngitis; Nocturnal hypoxemia; Fatigue; Tobacco abuse; Hepatitis C; Cerumen impaction; Warts; Hearing loss; and Bleeding disorder on her problem list.     is allergic to aspirin; tramadol hcl; willow bark; willow leaf swallow wort rhizome; benadryl; codeine; prednisone; and promethazine hcl.  [name] does not currently have medications on file.  Past Surgical History  Procedure Date  . Tonsilllectomy 1955  . Vocal cord biopsy 2009    pt reports she had voice loss, reports that she had precancerous lesions on the throat   . Hiatal hernia repair 2003  . Appendectomy in teens   . Total abdominal hysterectomy w/ bilateral salpingoophorectomy March 2006    Non Cancerous   . Abdominal hernia repair x2 more   . Left elbow surgery 1999  . R ankle surgery x5 in 1993-1999  . Partial colectomy 2009    PT. REPORTS THAT SHE HAS HAD 8 INFECTIOS PREVO\IOUSLY WHICH REQUIRED SURGERY  . Umbilical hernis repair March 24,2010  . Hernia repair     incisional  . Colonoscopy Sept 2009    SLF: frequent sigmoid colon and descending colon diverticula, thickened walls in sigmoid, small internal hemorrhoids, colon polyp: hyperplastic, normal random biopsies  .  Esophagogastroduodenoscopy March 2009    SLF: normal esophagus, gastric erosion, benign path    Denies any headaches, dizziness, double vision, fevers, chills, night sweats, nausea, vomiting, diarrhea, constipation, chest pain, heart palpitations, shortness of breath, blood in stool, black tarry stool, urinary pain, urinary burning, urinary frequency, hematuria.   PHYSICAL EXAMINATION  ECOG PERFORMANCE STATUS: 0 - Asymptomatic  Filed Vitals:   10/22/12 0911  BP: 122/74  Pulse: 73  Temp: 98.1 F (36.7 C)  Resp: 20    GENERAL:alert, no distress, well nourished, well developed, comfortable, cooperative, smiling and chronically ill-appearing, dose not appear to be in any discomfort.  SKIN: skin color, texture, turgor are normal, no rashes or significant lesions HEAD: Normocephalic, No masses, lesions, tenderness or abnormalities EYES: normal, Conjunctiva are pink and non-injected EARS: External ears normal OROPHARYNX:lips, buccal mucosa, and tongue  normal and mucous membranes are moist  NECK: supple, trachea midline LYMPH:  no palpable lymphadenopathy, no hepatosplenomegaly BREAST:not examined LUNGS: clear to auscultation and percussion HEART: regular rate & rhythm, no murmurs, no gallops, S1 normal and S2 normal ABDOMEN:abdomen soft, non-tender, normal bowel sounds, no masses or organomegaly and no hepatosplenomegaly BACK: Back symmetric, no curvature., No CVA tenderness EXTREMITIES:less then 2 second capillary refill, no joint deformities, effusion, or inflammation, no edema, no skin discoloration, no clubbing, no cyanosis.  PIP and DIP changes identified from osteoarthritis  NEURO: alert & oriented x 3 with fluent speech, no focal motor/sensory deficits, gait normal    LABORATORY DATA: CBC    Component Value Date/Time   WBC 12.6* 04/09/2012 0916   RBC 4.60 04/09/2012 0916   HGB 14.4 04/09/2012 0916   HCT 41.9 04/09/2012 0916   PLT 259 04/09/2012 0916   MCV 91.1 04/09/2012 0916    MCH 31.3 04/09/2012 0916   MCHC 34.4 04/09/2012 0916   RDW 14.0 04/09/2012 0916   LYMPHSABS 3.3 01/27/2012 1850   MONOABS 0.9 01/27/2012 1850   EOSABS 0.2 01/27/2012 1850   BASOSABS 0.0 01/27/2012 1850    ASSESSMENT:  1. Leukocytosis, multifactorial, but stable since 2009. Secondary to tobacco abuse and lung disease.   2. Hepatitis C, s/p treatment 1999-2000 for hepatitis C after being diagnosed in 1993 with Rebetron and Intron A x 52 weeks of treatment by Dr. Kerby Nora at [facility].  3. Sleep apnea, does not use CPAP, contributing to #1.  Now on O2 2L at HS.  4. Hemoglobinemia, secondary to lung disease 5. History of rectal bleeding requiring hospitalization 6. Strep Throat in May 2013, treated.   PLAN:  1. I personally reviewed and went over radiographic studies with the patient. 2. I personally reviewed and went over laboratory results with the patient. 3. Lab work today: CBC diff, PT/INR, PTT, Platelet function assay 4. Lab work in 6 months: CBC diff 5. If lab work is unimpressive in 6 months, will release the patient from the clinic.  6. Continue follow-up with GI 7. Return in 6 months for follow-up.  All questions were answered. The patient knows to call the clinic with any problems, questions or concerns. We can certainly see the patient much sooner if necessary.  The patient and plan discussed with Glenford Peers, MD and he is in agreement with the aforementioned.  Rhyatt Muska

## 2012-10-25 DIAGNOSIS — B078 Other viral warts: Secondary | ICD-10-CM | POA: Diagnosis not present

## 2012-10-25 DIAGNOSIS — L819 Disorder of pigmentation, unspecified: Secondary | ICD-10-CM | POA: Diagnosis not present

## 2012-10-27 ENCOUNTER — Ambulatory Visit (INDEPENDENT_AMBULATORY_CARE_PROVIDER_SITE_OTHER): Payer: Medicare Other | Admitting: Orthopedic Surgery

## 2012-10-27 ENCOUNTER — Encounter: Payer: Self-pay | Admitting: Orthopedic Surgery

## 2012-10-27 ENCOUNTER — Telehealth: Payer: Self-pay | Admitting: Orthopedic Surgery

## 2012-10-27 VITALS — BP 118/58 | Ht 62.0 in | Wt 124.0 lb

## 2012-10-27 DIAGNOSIS — M19079 Primary osteoarthritis, unspecified ankle and foot: Secondary | ICD-10-CM | POA: Diagnosis not present

## 2012-10-27 NOTE — Progress Notes (Signed)
Patient ID: [name], female   DOB: [birth year], 61 y.o.   MRN: [id number] Chief Complaint  Patient presents with  . Ankle Pain    Right ankle pain d/t injury 09/08/12 referred by Dr.Simpson and Dr. Gerilyn Pilgrim     [name] is a 61 year old female saw 5 years ago for right ankle arthrosis. She did not want ankle fusion at that time. She indicates that she's been to several doctors who also recommended ankle fusion she declined because she doesn't want to stop smoking and she lives alone has several pets  She wants to know what can be done again I told her she needs an ankle fusion  She does not want to do that. I spent some time talking to her and tried to convince her that that is the best chance she has had relief she declined  She complains of sharp dull throbbing stabbing burning 10 out of 10 constant pain associated with numbness and tingling and callus formation in the right lower extremity  Review of systems is negative for genitourinary symptoms skin changes easy bleeding and bruising and seasonal allergies all other symptoms were positive  Medical history as recorded  Exam shows a very stiff ankle joint with hypertrophy of the joint and tenderness around the joint. There is no instability. No muscle atrophy. Skin is intact. Temperature of the foot is normal no edema. She has sensory changes to soft touch were decreased sensation hypersensitive. Her mood and affect are normal but she is very upset that she can get something done she has a limp she is oriented her appearance is normal she is thin her vital signs are: BP 118/58  Ht 5\' 2"  (1.575 m)  Wt 124 lb (56.246 kg)  BMI 22.68 kg/m2  X-rays were recently done she has a hypertrophic degenerative ankle with severe joint space narrowing  Ankle arthritis  Again recommend ankle fusion.

## 2012-10-27 NOTE — Telephone Encounter (Signed)
No note, error/bsf

## 2012-11-11 DIAGNOSIS — Z79899 Other long term (current) drug therapy: Secondary | ICD-10-CM | POA: Diagnosis not present

## 2012-11-11 DIAGNOSIS — M159 Polyosteoarthritis, unspecified: Secondary | ICD-10-CM | POA: Diagnosis not present

## 2012-11-11 DIAGNOSIS — G8929 Other chronic pain: Secondary | ICD-10-CM | POA: Diagnosis not present

## 2012-11-19 DIAGNOSIS — Z79899 Other long term (current) drug therapy: Secondary | ICD-10-CM | POA: Diagnosis not present

## 2012-11-19 DIAGNOSIS — G894 Chronic pain syndrome: Secondary | ICD-10-CM | POA: Diagnosis not present

## 2012-11-23 ENCOUNTER — Telehealth: Payer: Self-pay | Admitting: Adult Health

## 2012-11-23 NOTE — Telephone Encounter (Signed)
PT WOULD LIKE TO KNOW IF DR DOOQAUH HAS CALLED [place] AND CHANGED HER BP MEDS. SHE DOES NOT KNOW THE NAME OF THIS MEDICATION

## 2012-11-24 NOTE — Telephone Encounter (Signed)
Called [facility] [place] office to clarify medication changes (if any) for the pt, spoke to [place] and advised clonidine was changed to 0.1mg  bid on 11-22-12, also noted pt made a phone call to [facility] [place] office on 11-11-12 to advise the hydroxazine was helping her sleep but too expensive and wanted to try something else, this nurse noted clonidine nor hydroxazine is listed in her chart, left message for pt to call office to clarify her current medications per [facility] office is not on same charting system as our office.

## 2012-11-24 NOTE — Telephone Encounter (Signed)
Pt returned call to advise that she is not taking the Clonidine as prescribed by [facility] [place] office and wanted to make sure we did not have this medication on her list, she noted recent changes that elevated her BP per withdrawls to coming off fentanyl patches as well as morphine and noted BP elevations and that she is not taking hydroxazine either, reviewed pt medication list in chart, removed fentanyl patches from list and all other medications confirmed correct in chart, pt asked when her follow up is, noted recall for 04-23-13 for [facility] and that she will receive a letter to advise her apt is due and to call in to schedule at her earliest convience, advised if pt has any cardiac concerns prior to this date to please feel free to call our office to schedule apt, pt understood and will contact if needed.

## 2012-11-24 NOTE — Telephone Encounter (Signed)
Called [facility] [place] office to advise that pt has declined taking the clonidine per noted her BP is now back to normal per only felt elevation due to pain medication withdrawls, Shanda Bumps advised she will update the pt chart

## 2012-11-25 NOTE — Telephone Encounter (Signed)
Pt noted that she has upcoming apt with [facility] [place] soon and will discuss changes at that time and notes she is feeling much better now and that she feels the worst is over, this nurse reiterated if she has any further concerns to please see him asap, pt understood

## 2012-11-29 ENCOUNTER — Ambulatory Visit (INDEPENDENT_AMBULATORY_CARE_PROVIDER_SITE_OTHER): Payer: Medicare Other | Admitting: Internal Medicine

## 2012-12-06 DIAGNOSIS — Z79899 Other long term (current) drug therapy: Secondary | ICD-10-CM | POA: Diagnosis not present

## 2012-12-06 DIAGNOSIS — R269 Unspecified abnormalities of gait and mobility: Secondary | ICD-10-CM | POA: Diagnosis not present

## 2012-12-06 DIAGNOSIS — G894 Chronic pain syndrome: Secondary | ICD-10-CM | POA: Diagnosis not present

## 2012-12-06 DIAGNOSIS — G8929 Other chronic pain: Secondary | ICD-10-CM | POA: Diagnosis not present

## 2012-12-06 DIAGNOSIS — M159 Polyosteoarthritis, unspecified: Secondary | ICD-10-CM | POA: Diagnosis not present

## 2012-12-06 DIAGNOSIS — G4700 Insomnia, unspecified: Secondary | ICD-10-CM | POA: Diagnosis not present

## 2012-12-17 ENCOUNTER — Other Ambulatory Visit: Payer: Self-pay | Admitting: Family Medicine

## 2012-12-20 ENCOUNTER — Telehealth: Payer: Self-pay | Admitting: Family Medicine

## 2012-12-21 NOTE — Telephone Encounter (Signed)
Needs to be seen in office within 90 days to be recerted for her o2. Sallie to schedule

## 2013-01-04 ENCOUNTER — Telehealth: Payer: Self-pay | Admitting: Family Medicine

## 2013-01-05 NOTE — Telephone Encounter (Signed)
Patient states that she is wondering if her blood pressure has any factor in the nose bleeds.  She also states that she has seen ENT for the same problem in the past and they were unable to find anything.  Please advise.

## 2013-01-05 NOTE — Telephone Encounter (Signed)
pls advise pt her blood pressure has been normal when checked here so I do not believe this is a problem

## 2013-01-05 NOTE — Telephone Encounter (Signed)
Patient states that she is having nerve block injection on Friday 2/28 by Dr. Gerilyn Pilgrim.  Was told to stop plavix 5 days before procedure and now is having nose bleeds.  Please advise.

## 2013-01-05 NOTE — Telephone Encounter (Signed)
Does need to stay off the plavix since having the block. Stopping the [plavix should not cause her to have nose bleeeds , the opposite. Advise management of noese bleed, direct pressure  To nostril and hold down head, if unabble to control at home needs to go to urgent caree or [facility] for management. If blood from one nostril needs to see ENT pls let me know

## 2013-01-07 DIAGNOSIS — Z79899 Other long term (current) drug therapy: Secondary | ICD-10-CM | POA: Diagnosis not present

## 2013-01-07 DIAGNOSIS — R269 Unspecified abnormalities of gait and mobility: Secondary | ICD-10-CM | POA: Diagnosis not present

## 2013-01-07 DIAGNOSIS — G894 Chronic pain syndrome: Secondary | ICD-10-CM | POA: Diagnosis not present

## 2013-01-10 ENCOUNTER — Other Ambulatory Visit: Payer: Self-pay | Admitting: Family Medicine

## 2013-01-25 ENCOUNTER — Telehealth: Payer: Self-pay | Admitting: Family Medicine

## 2013-01-25 NOTE — Telephone Encounter (Signed)
Patient aware.

## 2013-01-25 NOTE — Telephone Encounter (Signed)
Noted  

## 2013-01-28 ENCOUNTER — Ambulatory Visit (INDEPENDENT_AMBULATORY_CARE_PROVIDER_SITE_OTHER): Payer: Medicare Other | Admitting: Adult Health

## 2013-01-28 ENCOUNTER — Encounter: Payer: Self-pay | Admitting: Adult Health

## 2013-01-28 VITALS — BP 112/70 | HR 86 | Ht 62.75 in | Wt 133.0 lb

## 2013-01-28 DIAGNOSIS — I10 Essential (primary) hypertension: Secondary | ICD-10-CM | POA: Diagnosis not present

## 2013-01-28 DIAGNOSIS — Z8679 Personal history of other diseases of the circulatory system: Secondary | ICD-10-CM

## 2013-01-28 DIAGNOSIS — E785 Hyperlipidemia, unspecified: Secondary | ICD-10-CM

## 2013-01-28 DIAGNOSIS — F17200 Nicotine dependence, unspecified, uncomplicated: Secondary | ICD-10-CM | POA: Diagnosis not present

## 2013-01-28 NOTE — Assessment & Plan Note (Signed)
I have reviewed her labs with her that were completed in November of 2013. She continues on the day and Zocor. A lobe labs are planned by Dr. Lodema Hong next week on her usual appointment. I discussed with her low cholesterol diet and how smoking contributes to her cholesterol status. She verbalizes understanding

## 2013-01-28 NOTE — Assessment & Plan Note (Signed)
I have spoken with her this visit concerning smoking cessation. She has a lot of anxiety issues and uses cigarettes to help calm her down. I doubt that she will stop.

## 2013-01-28 NOTE — Progress Notes (Deleted)
Name: [name]    DOB: Jul 08, 1951  Age: 62 y.o.  MR#: [id number]       PCP:  Syliva Overman, MD      Insurance: Payor: MEDICARE  Plan: MEDICARE PART A AND B  Product Type: *No Product type*    CC:    Chief Complaint  Patient presents with  . Palpitations   PT ADVISED MD SIMPSON WARNED HER ABOUT HER LAST LABS NOTED ON 09-29-12 THAT HER CHOLESTEROL IS CREEPING UP AND WANTED TO INFORM YOU ABOUT THE CONCERN VS Filed Vitals:   01/28/13 1118  BP: 112/70  Pulse: 86  Height: 5' 2.75" (1.594 m)  Weight: 133 lb (60.328 kg)    Weights Current Weight  01/28/13 133 lb (60.328 kg)  10/27/12 124 lb (56.246 kg)  10/22/12 127 lb (57.607 kg)    Blood Pressure  BP Readings from Last 3 Encounters:  01/28/13 112/70  10/27/12 118/58  10/22/12 122/74     Admit date:  (Not on file) Last encounter with RMR:  11/23/2012   Allergy Aspirin; Tramadol hcl; Willow bark; Willow leaf swallow wort rhizome; Benadryl; Codeine; Prednisone; and Promethazine hcl  Current Outpatient Prescriptions  Medication Sig Dispense Refill  . clopidogrel (PLAVIX) 75 MG tablet Take 0.5 tablets (37.5 mg total) by mouth every morning. RESTART THIS MEDICATION ON MARCH 23RD 2013. One half daily per patient      . cyclobenzaprine (FLEXERIL) 10 MG tablet Take 10 mg by mouth 3 (three) times daily as needed.      . diltiazem (CARDIZEM CD) 120 MG 24 hr capsule Take 1 capsule (120 mg total) by mouth daily. For blood pressure  30 capsule  11  . docusate sodium (COLACE) 100 MG capsule Take 100 mg by mouth 2 (two) times daily.      Marland Kitchen EPIPEN 2-PAK 0.3 MG/0.3ML DEVI USE AS DIRECTED  2 each  0  . HYDROcodone-acetaminophen (LORTAB) 7.5-500 MG per tablet Take 1 tablet by mouth every 6 (six) hours as needed. FOR PAIN      . metoprolol succinate (TOPROL-XL) 25 MG 24 hr tablet 1 tablet daily. for palpitations  30 tablet  11  . neomycin-bacitracin-polymyxin (POLYSPORIN) ophthalmic ointment Place into both eyes as needed.        .  simvastatin (ZOCOR) 40 MG tablet TAKE (1) TABLET BY MOUTH DAILY AT BEDTIME FOR CHOLESTEROL  30 tablet  2  . tiotropium (SPIRIVA) 18 MCG inhalation capsule Place 18 mcg into inhaler and inhale daily.      Marland Kitchen ZETIA 10 MG tablet TAKE ONE TABLET BY MOUTH AT BEDTIME FOR CHOLESTEROL.  30 tablet  2  . ZYRTEC 10 MG tablet TAKE (1) TABLET BY MOUTH ONCE DAILY AS NEEDED FOR HIVES.  30 each  5  . [DISCONTINUED] amLODipine (NORVASC) 5 MG tablet Take 5 mg by mouth daily.         No current facility-administered medications for this visit.    Discontinued Meds:   There are no discontinued medications.  Patient Active Problem List  Diagnosis  . HEPATITIS C  . GLUCOSE INTOLERANCE  . HYPERLIPIDEMIA  . ANXIETY STATE, UNSPECIFIED  . ALCOHOL ABUSE, IN REMISSION  . NICOTINE ADDICTION  . ESSENTIAL HYPERTENSION  . TIA  . REACTIVE AIRWAY DISEASE  . GERD  . GASTRITIS  . HIATAL HERNIA  . DERMATITIS  . INGROWN TOENAIL  . URTICARIA  . ARTHRITIS, CHRONIC  . ARTHRITIS, RIGHT ANKLE  . POLYARTHRITIS  . NECK PAIN, CHRONIC  . MYOSITIS  .  LEG PAIN, RIGHT  . INSOMNIA UNSPECIFIED  . SLEEP APNEA  . FATIGUE  . NUMBNESS, HAND  . HEADACHE  . Palpitations  . CHEST PAIN UNSPECIFIED  . DYSPHAGIA UNSPECIFIED  . IMPAIRED FASTING GLUCOSE  . ALLERGIC REACTION  . HEPATITIS C, HX OF  . HYPERTENSION, HX OF  . SWELLING, MASS, OR LUMP IN CHEST  . History of epistaxis  . Hematochezia  . Acute GI bleeding  . Leukocytosis  . Aortic mural thrombus  . Diverticulosis  . Strep pharyngitis  . Nocturnal hypoxemia  . Fatigue  . Tobacco abuse  . Hepatitis C  . Cerumen impaction  . Warts  . Hearing loss  . Bleeding disorder  . Ankle arthritis    LABS    Component Value Date/Time   NA 141 09/29/2012 1118   NA 141 04/09/2012 0916   NA 142 01/29/2012 0533   K 4.9 09/29/2012 1118   K 4.7 04/09/2012 0916   K 3.9 01/29/2012 0533   CL 102 09/29/2012 1118   CL 105 04/09/2012 0916   CL 107 01/29/2012 0533   CO2 33*  09/29/2012 1118   CO2 28 04/09/2012 0916   CO2 27 01/29/2012 0533   GLUCOSE 91 09/29/2012 1118   GLUCOSE 90 04/09/2012 0916   GLUCOSE 96 01/29/2012 0533   BUN 11 09/29/2012 1118   BUN 13 04/09/2012 0916   BUN 5* 01/29/2012 0533   CREATININE 0.61 09/29/2012 1118   CREATININE 0.82 04/09/2012 0916   CREATININE 0.58 01/29/2012 0533   CREATININE 0.59 01/28/2012 0704   CREATININE 0.65 01/27/2012 1850   CREATININE 0.63 10/17/2011 1006   CALCIUM 10.2 09/29/2012 1118   CALCIUM 9.6 04/09/2012 0916   CALCIUM 8.9 01/29/2012 0533   GFRNONAA >90 01/29/2012 0533   GFRNONAA >90 01/28/2012 0704   GFRNONAA >90 01/27/2012 1850   GFRAA >90 01/29/2012 0533   GFRAA >90 01/28/2012 0704   GFRAA >90 01/27/2012 1850   CMP     Component Value Date/Time   NA 141 09/29/2012 1118   K 4.9 09/29/2012 1118   CL 102 09/29/2012 1118   CO2 33* 09/29/2012 1118   GLUCOSE 91 09/29/2012 1118   BUN 11 09/29/2012 1118   CREATININE 0.61 09/29/2012 1118   CREATININE 0.58 01/29/2012 0533   CALCIUM 10.2 09/29/2012 1118   PROT 6.9 09/29/2012 1118   ALBUMIN 4.4 09/29/2012 1118   AST 19 09/29/2012 1118   ALT 16 09/29/2012 1118   ALKPHOS 60 09/29/2012 1118   BILITOT 0.3 09/29/2012 1118   GFRNONAA >90 01/29/2012 0533   GFRAA >90 01/29/2012 0533       Component Value Date/Time   WBC 12.2* 10/22/2012 1019   WBC 12.6* 04/09/2012 0916   WBC 8.7 01/29/2012 0533   HGB 16.1* 10/22/2012 1019   HGB 14.4 04/09/2012 0916   HGB 10.5* 01/29/2012 0533   HCT 47.7* 10/22/2012 1019   HCT 41.9 04/09/2012 0916   HCT 30.9* 01/29/2012 0533   MCV 94.1 10/22/2012 1019   MCV 91.1 04/09/2012 0916   MCV 92.8 01/29/2012 0533    Lipid Panel     Component Value Date/Time   CHOL 189 09/29/2012 1118   TRIG 134 09/29/2012 1118   HDL 53 09/29/2012 1118   CHOLHDL 3.6 09/29/2012 1118   VLDL 27 09/29/2012 1118   LDLCALC 109* 09/29/2012 1118    ABG    Component Value Date/Time   PHART 7.387 05/28/2011 0910   PCO2ART 41.1 05/28/2011 0910   PO2ART 81.2  05/28/2011 0910   HCO3 24.2* 05/28/2011 0910   TCO2 21.1 05/28/2011 0910   ACIDBASEDEF 0.2 05/28/2011 0910   O2SAT 96.7 05/28/2011 0910     Lab Results  Component Value Date   TSH 1.082 10/17/2011   BNP (last 3 results) No results found for this basename: PROBNP,  in the last 8760 hours Cardiac Panel (last 3 results) No results found for this basename: CKTOTAL, CKMB, TROPONINI, RELINDX,  in the last 72 hours  Iron/TIBC/Ferritin No results found for this basename: iron, tibc, ferritin     EKG Orders placed in visit on 01/28/13  . EKG 12-LEAD     Prior Assessment and Plan Problem List as of 01/28/2013     ICD-9-CM   HEPATITIS C   Last Assessment & Plan   09/20/2012 Office Visit Written 09/20/2012 10:09 AM by [name], MD     Dx  In 1993 treated in June 1999 to June 2000. Dr Kerby Nora at [facility], and rebitrol  Chemo three times weekly for 52 weeks      GLUCOSE INTOLERANCE   HYPERLIPIDEMIA   Last Assessment & Plan   04/23/2012 Office Visit Written 04/24/2012 11:32 AM by [name], MD     Controlled, no change in medication     ANXIETY STATE, UNSPECIFIED   ALCOHOL ABUSE, IN REMISSION   NICOTINE ADDICTION   Last Assessment & Plan   09/20/2012 Office Visit Written 10/01/2012  5:30 AM by [name], MD     Patient counseled for approximately 5 minutes regarding the health risks of ongoing nicotine use, specifically all types of cancer, heart disease, stroke and respiratory failure. The options available for help with cessation ,the behavioral changes to assist the process, and the option to either gradully reduce usage  Or abruptly stop.is also discussed. Pt is also encouraged to set specific goals in number of cigarettes used daily, as well as to set a quit date.     ESSENTIAL HYPERTENSION   Last Assessment & Plan   09/20/2012 Office Visit Written 10/01/2012  5:29 AM by [name], MD     Controlled, no change in medication DASH diet  and commitment to daily physical activity for a minimum of 30 minutes discussed and encouraged, as a part of hypertension management. The importance of attaining a healthy weight is also discussed.     TIA   REACTIVE AIRWAY DISEASE   GERD   Last Assessment & Plan   02/05/2012 Office Visit Written 02/05/2012  5:05 PM by Jodelle Gross, NP     She will need to follow-up with Dr.Rourke in the office for answers to questions concerning GI issues. I have given her a copy of her path report that showed only gastritis. She is to have capsule study when it can be arranged. Will defer to them for further testing.    GASTRITIS   HIATAL HERNIA   DERMATITIS   INGROWN TOENAIL   URTICARIA   ARTHRITIS, CHRONIC   ARTHRITIS, RIGHT ANKLE   POLYARTHRITIS   NECK PAIN, CHRONIC   MYOSITIS   LEG PAIN, RIGHT   Last Assessment & Plan   04/09/2011 Office Visit Written 04/20/2011  6:15 AM by Syliva Overman, MD     Controlled, pain management is through pain clinic    INSOMNIA UNSPECIFIED   Last Assessment & Plan   04/17/2011 Office Visit Written 04/26/2011  8:03 PM by Syliva Overman, MD     Controlled, no change in medication  SLEEP APNEA   FATIGUE   Last Assessment & Plan   04/09/2011 Office Visit Written 04/20/2011  6:11 AM by Syliva Overman, MD     Deteriorted, has upcoming cardiology appt which is due, she will schedule. Also to be referred to pulmonary specialist for eval of this problem    NUMBNESS, HAND   HEADACHE   Palpitations   Last Assessment & Plan   09/20/2012 Office Visit Written 10/01/2012  5:32 AM by [name], MD     Controlled, continue toprol    CHEST PAIN UNSPECIFIED   Last Assessment & Plan   10/24/2011 Office Visit Edited 10/24/2011 12:06 PM by Jodelle Gross, NP     Do not think there is a cardiac etiology for this pain. She admits to smoking prior to the episode, and having chronic palpitations.  EKG is normal, last stress test is normal. Will add back  low dose metoprolol 12.5 mg for palpitations. Uncertain why she is on plavix. She has a history of TIA's in the past. This may have been started by neurologist as we have not placed her on it.  She does not remember to put her on the meds.She is not on this for any cardiac reasons.    DYSPHAGIA UNSPECIFIED   IMPAIRED FASTING GLUCOSE   ALLERGIC REACTION   HEPATITIS C, HX OF   HYPERTENSION, HX OF   Last Assessment & Plan   07/23/2012 Office Visit Written 07/23/2012 12:00 PM by Jodelle Gross, NP     Blood pressure has excellent control. She is doing well on the Cardizem CD 120 mg daily she continues to have palpitations however and I believe this may be related to the once a day dosing of the metoprolol should be taken twice a day administration.  I changed her metoprolol to once a day long-acting dosing metoprolol succinate 25 mg. She is requesting that we place on the prescription the reason for taking the medication so that the pharmacist will place this on the label. We will see her again in 9 months.    SWELLING, MASS, OR LUMP IN CHEST   History of epistaxis   Last Assessment & Plan   10/17/2011 Office Visit Written 10/17/2011  9:57 AM by [name], MD     3 month h/o intermittent left epistaxis, on average 3 times per week refer to ENT    Hematochezia   Acute GI bleeding   Leukocytosis   Last Assessment & Plan   04/23/2012 Office Visit Written 04/24/2012 11:29 AM by [name], MD     Discussed with pt the need to f/u with hematology as previously arranged    Aortic mural thrombus   Diverticulosis   Strep pharyngitis   Last Assessment & Plan   03/26/2012 Office Visit Written 03/28/2012  7:44 PM by Salley Scarlet, MD     +strep swab, will treat with antibiotics     Nocturnal hypoxemia   Last Assessment & Plan   04/23/2012 Office Visit Written 04/24/2012 11:31 AM by [name], MD     Pt on supplemental oxygen at night    Fatigue   Last Assessment & Plan    04/23/2012 Office Visit Written 04/24/2012 11:35 AM by [name], MD     Chronic complaint, but thinks this worsening. I reviewed the fact that her mammogram, GI studies and chest scan were all within the past 18 months and negative. She is no longer anemic from blood  loss, and her TSh is normal. She needs to stop smoking. I believe she would also benefit from an SSRI , but she is not interested     Tobacco abuse   Last Assessment & Plan   07/23/2012 Office Visit Written 07/23/2012 12:02 PM by Jodelle Gross, NP     She has long-standing tobacco abuse, despite counseling, she has no plans to stop at this time. She states this helps with her anxiety. I have asked her to please cut down and she is willing to try this. The where the risk factors associated with ongoing tobacco abuse.    Hepatitis C   Cerumen impaction   Last Assessment & Plan   09/20/2012 Office Visit Written 09/20/2012  2:54 PM by [name], MD     Right ear successfully irrigated , no trauma, TM clear, pt reported improved hearing post procedure    Warts   Last Assessment & Plan   09/20/2012 Office Visit Written 10/01/2012  5:31 AM by [name], MD     Painful warts in the han, refer to dermatology for further eval and treatment    Hearing loss   Last Assessment & Plan   09/20/2012 Office Visit Written 10/01/2012  5:30 AM by [name], MD     Reports deterioration in hearing and already uses a hearing aid, will refer for re eval by ENT    Bleeding disorder   Ankle arthritis       Imaging: No results found.

## 2013-01-28 NOTE — Progress Notes (Signed)
HPI: [name] is a 62 year old anxious patient of Dr. Eden Jones we are following for ongoing assessment and management of palpitations and hypertension. She has a history of a GI bleed in March of 2013 and was found incidentally to have a focal intramural thrombus along the distal descending thoracic aorta without significant intra-abdominal narrowing. They should this chronic pain syndrome and is followed by Dr. Gerilyn Jones. She has stopped taking clonidine secondary to hypotension, and being in the process of weaning off of morphine. She also been on hydroxyzine. She comes today on followup for evaluation of blood pressure control.    Complaining of profound weakness.Some numbness and loss of strength on the right side. She has cervical disc disease.Still having rapid HR and chest pressure symptoms intermittently, coughing makes it go away. She is medically compliant. She denies any cardiac symptoms. She is requesting names of other neurologists, so that she can talk with Dr. Lodema Jones, her primary care, about sending for a second opinion. Allergies  Allergen Reactions  . Aspirin Hives and Itching  . Tramadol Hcl Other (See Comments)    Lowers BP  . Willow Bark (White Coldiron) Hives, Itching and Swelling    Requires EPI PEN. Swelling of throat, tongue.   [name] Hives, Itching and Swelling    Requires EPI PEN, Welling of throat, tongue.  . Benadryl (Diphenhydramine Hcl) Itching    Makes patient fell hyper.  . Codeine     Patient also states she is allergic to steroids.  . Prednisone     All steroids  . Promethazine Hcl Anxiety    Current Outpatient Prescriptions  Medication Sig Dispense Refill  . clopidogrel (PLAVIX) 75 MG tablet Take 0.5 tablets (37.5 mg total) by mouth every morning. RESTART THIS MEDICATION ON MARCH 23RD 2013. One half daily per patient      . cyclobenzaprine (FLEXERIL) 10 MG tablet Take 10 mg by mouth 3 (three) times daily as needed.      .  diltiazem (CARDIZEM CD) 120 MG 24 hr capsule Take 1 capsule (120 mg total) by mouth daily. For blood pressure  30 capsule  11  . docusate sodium (COLACE) 100 MG capsule Take 100 mg by mouth 2 (two) times daily.      Marland Kitchen EPIPEN 2-PAK 0.3 MG/0.3ML DEVI USE AS DIRECTED  2 each  0  . HYDROcodone-acetaminophen (LORTAB) 7.5-500 MG per tablet Take 1 tablet by mouth every 6 (six) hours as needed. FOR PAIN      . metoprolol succinate (TOPROL-XL) 25 MG 24 hr tablet 1 tablet daily. for palpitations  30 tablet  11  . neomycin-bacitracin-polymyxin (POLYSPORIN) ophthalmic ointment Place into both eyes as needed.        . simvastatin (ZOCOR) 40 MG tablet TAKE (1) TABLET BY MOUTH DAILY AT BEDTIME FOR CHOLESTEROL  30 tablet  2  . tiotropium (SPIRIVA) 18 MCG inhalation capsule Place 18 mcg into inhaler and inhale daily.      Marland Kitchen ZETIA 10 MG tablet TAKE ONE TABLET BY MOUTH AT BEDTIME FOR CHOLESTEROL.  30 tablet  2  . ZYRTEC 10 MG tablet TAKE (1) TABLET BY MOUTH ONCE DAILY AS NEEDED FOR HIVES.  30 each  5  . [DISCONTINUED] amLODipine (NORVASC) 5 MG tablet Take 5 mg by mouth daily.         No current facility-administered medications for this visit.    Past Medical History  Diagnosis Date  . Hepatitis C 1993  Needs Hepatic panel every 6   months, treated for  1 year   . Hyperlipidemia 2001  . HEARING LOSS     since age 68  . Hypertension 2001  . Sleep apnea 2001    non compliant wit the use of the machine  . Tinnitus 2006    disabling  . Fracture 2006    left foot & ankle , immobilized for healing   . Diverticulosis 2008    diagnosed   . Panic disorder     was followed by mental health  . Diverticulitis     pt reports 8 times. Dr. Leticia Penna colectomy in 2009  . Hiatal hernia     Per medical history form dated 05/02/11. Repaired.  . Night sweats     Per medical history form dated 05/02/11.  Marland Kitchen SOB (shortness of breath)     Per medical history form dated 05/02/11.  . Wears glasses   . Arthritis     Per  medical history form dated 05/02/11.  . Menopause     per medical history form  . Generalized headaches     Patient stated they are felt in back of the head, not throbing. But always in same spot. MRI's done, no reason why they occur.  . Gout     Recently diagnosed.  . Wears dentures     Per medical history form dated 05/02/11.  . Anemia due to blood loss, acute 01/28/2012  . Acute GI bleeding 01/28/2012  . 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 Diastolic dysfunction 01/28/2012    Grade 1  . Sleep apnea     wear oxygen at bedtime.     Past Surgical History  Procedure Laterality Date  . Tonsilllectomy  1955  . Vocal cord biopsy  2009    pt reports she had voice loss, reports that she had precancerous lesions on the throat   . Hiatal hernia repair  2003  . Appendectomy in teens    . Total abdominal hysterectomy w/ bilateral salpingoophorectomy  March 2006    Non Cancerous   . Abdominal hernia repair x2 more    . Left elbow surgery  1999  . R ankle surgery x5 in  1993-1999  . Partial colectomy  2009    PT. REPORTS THAT SHE HAS HAD 8 INFECTIOS PREVO\IOUSLY WHICH REQUIRED SURGERY  . Umbilical hernis repair  March 24,2010  . Hernia repair      incisional  . Colonoscopy  Sept 2009    SLF: frequent sigmoid colon and descending colon diverticula, thickened walls in sigmoid, small internal hemorrhoids, colon polyp: hyperplastic, normal random biopsies  . Esophagogastroduodenoscopy  March 2009    SLF: normal esophagus, gastric erosion, benign path    ZOX:[id number] of systems complete and found to be negative unless listed above  PHYSICAL EXAM BP 112/70  Pulse 86  Ht 5' 2.75" (1.594 m)  Wt 133 lb (60.328 kg)  BMI 23.74 kg/m2 General: Well developed, well nourished, in no acute distress Head: Eyes PERRLA, No xanthomas.   Normal cephalic and atramatic  Lungs: Clear bilaterally to auscultation and percussion. Heart: HRRR S1 S2, without MRG.  Pulses are 2+ & equal.             No carotid bruit. No JVD.  No abdominal bruits. No femoral bruits. Abdomen: Bowel sounds are positive, abdomen soft and non-tender without masses or  Hernia's noted. Msk:  Back normal, normal gait. Normal strength and tone for age. Extremities: No clubbing, cyanosis or edema.  DP +1 Neuro: Alert and oriented X 3. Psych:  Good affect, responds appropriately    ASSESSMENT AND PLAN

## 2013-01-28 NOTE — Patient Instructions (Addendum)
Your physician recommends that you schedule a follow-up appointment in: 12 MONTHS

## 2013-01-28 NOTE — Assessment & Plan Note (Signed)
Excellent control of blood pressure on Cardizem. We will not make any changes at this time. We will see her again in one year unless she becomes symptomatic. She will continue follow with primary care physician for ongoing medical management.

## 2013-01-31 DIAGNOSIS — R269 Unspecified abnormalities of gait and mobility: Secondary | ICD-10-CM | POA: Diagnosis not present

## 2013-01-31 DIAGNOSIS — Z79899 Other long term (current) drug therapy: Secondary | ICD-10-CM | POA: Diagnosis not present

## 2013-01-31 DIAGNOSIS — G894 Chronic pain syndrome: Secondary | ICD-10-CM | POA: Diagnosis not present

## 2013-02-02 ENCOUNTER — Encounter: Payer: Self-pay | Admitting: Family Medicine

## 2013-02-02 ENCOUNTER — Ambulatory Visit (INDEPENDENT_AMBULATORY_CARE_PROVIDER_SITE_OTHER): Payer: Medicare Other | Admitting: Family Medicine

## 2013-02-02 VITALS — BP 130/74 | HR 81 | Resp 16 | Ht 62.0 in | Wt 134.0 lb

## 2013-02-02 DIAGNOSIS — R0902 Hypoxemia: Secondary | ICD-10-CM

## 2013-02-02 DIAGNOSIS — F17200 Nicotine dependence, unspecified, uncomplicated: Secondary | ICD-10-CM | POA: Diagnosis not present

## 2013-02-02 DIAGNOSIS — Z720 Tobacco use: Secondary | ICD-10-CM

## 2013-02-02 DIAGNOSIS — E785 Hyperlipidemia, unspecified: Secondary | ICD-10-CM | POA: Diagnosis not present

## 2013-02-02 DIAGNOSIS — G894 Chronic pain syndrome: Secondary | ICD-10-CM

## 2013-02-02 DIAGNOSIS — R5383 Other fatigue: Secondary | ICD-10-CM

## 2013-02-02 DIAGNOSIS — I10 Essential (primary) hypertension: Secondary | ICD-10-CM

## 2013-02-02 DIAGNOSIS — R5381 Other malaise: Secondary | ICD-10-CM

## 2013-02-02 DIAGNOSIS — Z9981 Dependence on supplemental oxygen: Secondary | ICD-10-CM

## 2013-02-02 MED ORDER — EZETIMIBE 10 MG PO TABS: ORAL_TABLET | ORAL | Status: DC

## 2013-02-02 NOTE — Patient Instructions (Addendum)
Annual wellness in 4.5 month, please call if you need me before  You are being referred to another pain clinic per your request.   Your office note will establish need for continued oxygen at bedtime  Please work on smoking cessation to reduce risk of cancer, heart disease and stroke, you do need to quit  Fasting lipid and CMP and tSH in 4.5 month  If you change your mind about changing from simvastatin to pravastatin for cholesterol management pls call and let me know

## 2013-02-06 NOTE — Assessment & Plan Note (Signed)
Ongoing need for nocturnal oxygen, nicotine use persists

## 2013-02-06 NOTE — Progress Notes (Signed)
  Subjective:    Patient ID: [name], female    DOB: Aug 04, 1951, 62 y.o.   MRN: [id number]  HPI  Pt in for review of chronic problems and for re evaluation for need for nocturnal oxygen She qualified based on hypoxia during sleep, still smokes on a daily basis and unable to set a quit date. Experiences shortness of breath with activity, and sclearly has ongoing and worsening COPD as nicotine use continues. She will continue to require nocturnl oxygen as a result. Nicotine cessation counselling is provided for approximately5 minutes during the visit. Pt requesting referral to anew pain clinic, disturbed about most recent visit when she was offered an pidural, and when she learned that a steroid would be used, she refused on the basis of a pednisone allergy. Feels as though she was not listened to and wants to establish with a new provide re pain management Has warning letter from her insurance re simvastatin and cardizem, states pharmacist assured her not a problem, states since she has been on the combination for years with no problem, no interest in med changes at this time, i did offer pravastatin  Review of Systems See HPI Denies recent fever or chills. Denies sinus pressure, nasal congestion, ear pain or sore throat. Chronic smoker's cough nd exertional dyspnea Denies chest pains, palpitations and leg swelling Denies abdominal pain, nausea, vomiting,diarrhea or constipation.   Denies dysuria, frequency, hesitancy or incontinence. Chronic  joint pain, swelling and limitation in mobility. Denies headaches, seizures, numbness, or tingling. Denies uncontrolled depression, anxiety or insomnia. Denies skin break down or rash.        Objective:   Physical Exam Patient alert and oriented and in no cardiopulmonary distress.  HEENT: No facial asymmetry, EOMI, no sinus tenderness,  oropharynx pink and moist.  Neck supple no adenopathy.TM clear  Chest: Decreased air entry throughout,  though adequate, few scattereded wheezes, bilaterallyt  CVS: S1, S2 no murmurs, no S3.  ABD: Soft non tender. Bowel sounds normal.  Ext: No edema  MS: Adequate ROM spine, shoulders, hips and knees.decreasd ROM abkle with deformity  Skin: Intact, no ulcerations or rash noted.  Psych: Good eye contact, normal affect. Memory intact not anxious or depressed appearing.  CNS: CN 2-12 intact, power,  normal throughout.        Assessment & Plan:

## 2013-02-06 NOTE — Assessment & Plan Note (Signed)
Hyperlipidemia:Low fat diet discussed and encouraged.  LDL slightly elevated Updated lab next visit

## 2013-02-06 NOTE — Assessment & Plan Note (Signed)
Cutting down gradually, unable to set quit date at this visit. Patient counseled for approximately 5 minutes regarding the health risks of ongoing nicotine use, specifically all types of cancer, heart disease, stroke and respiratory failure. The options available for help with cessation ,the behavioral changes to assist the process, and the option to either gradully reduce usage  Or abruptly stop.is also discussed. Pt is also encouraged to set specific goals in number of cigarettes used daily, as well as to set a quit date.

## 2013-02-06 NOTE — Assessment & Plan Note (Signed)
Unchnged, pt referred to new pain clinic per her request, i explainedthis may take over 1 month to find a new Provider, states she understands this

## 2013-02-06 NOTE — Assessment & Plan Note (Signed)
Controlled, no change in medication  

## 2013-02-23 ENCOUNTER — Ambulatory Visit: Payer: Medicare Other | Admitting: Family Medicine

## 2013-03-02 DIAGNOSIS — R269 Unspecified abnormalities of gait and mobility: Secondary | ICD-10-CM | POA: Diagnosis not present

## 2013-03-02 DIAGNOSIS — G894 Chronic pain syndrome: Secondary | ICD-10-CM | POA: Diagnosis not present

## 2013-03-02 DIAGNOSIS — Z79899 Other long term (current) drug therapy: Secondary | ICD-10-CM | POA: Diagnosis not present

## 2013-03-04 ENCOUNTER — Telehealth: Payer: Self-pay | Admitting: *Deleted

## 2013-03-04 ENCOUNTER — Telehealth: Payer: Self-pay | Admitting: Adult Health

## 2013-03-04 DIAGNOSIS — R000 Tachycardia, unspecified: Secondary | ICD-10-CM | POA: Diagnosis not present

## 2013-03-04 DIAGNOSIS — Z87891 Personal history of nicotine dependence: Secondary | ICD-10-CM | POA: Diagnosis not present

## 2013-03-04 DIAGNOSIS — R079 Chest pain, unspecified: Secondary | ICD-10-CM | POA: Diagnosis not present

## 2013-03-04 NOTE — Telephone Encounter (Signed)
Pt was advised by KL NP to come into our office today to have an event monitor placed on per recent episode of elevated HR at PCP office, pt advised she will try to get a ride to our office by 4pm this afternoon, was advised by manager that pt called back and rescheduled for 03-09-13, KL made aware pt transportation concerns, advised to contact pt to alert her if any worsening sxs occur to call 911 to be evaluated in the ED, to schedule pt for an apt asap to place event monitor on and see cardiology MD/NP, called pt and left detailed message to call 911 per worsening sxs as well as to call our office to schedule apt asap if transportation comes available sooner than her 03-09-13 apt, KL made aware verbally

## 2013-03-04 NOTE — Telephone Encounter (Signed)
Received phone call from [facility] nurse practitioner at [facility] concerning this patient who arrived at their office. The patient was complaining of cold-like symptoms. She was found to have a rapid heart rhythm and EKG performed revealing an SVT with a heart rate in the 179 beats per minute. The patient had an IV started and she was given metoprolol 5 mg x1. And converted back to normal sinus rhythm with a rate of 90 beats per minute. She remained in the office for an additional 30 minutes with repeat EKG with continued normal sinus rhythm rate between 80 and 90 beats per minute.  The patient has a history of palpitations, his on metoprolol 25 mg daily, she denied medical noncompliance. Would advise that she continue taking her metoprolol as directed, she is to have a cardiac monitor placed with followup in our office thereafter. A phone call was made to the patient to have her come by our office this afternoon for assessment and placement of cardiac monitor. The patient states that she was unable to come this afternoon due to transportation issues. She was scheduled for April 30 for cardiac monitor placement.  We have called her back to inform her that we are concerned about her rapid heart rate. Labs were drawn at the clinic, to include a BMET and a TSH. Those labs will be faxed to our office when results are available. The patient will continue metoprolol, and come to the office as soon as she is able to receive transportation for cardiac monitor and reassessment.

## 2013-03-07 ENCOUNTER — Telehealth: Payer: Self-pay

## 2013-03-07 ENCOUNTER — Encounter: Payer: Self-pay | Admitting: Adult Health

## 2013-03-07 NOTE — Telephone Encounter (Signed)
Patient states that she will not go to [facility] ED because of care that she received there in the past.  Again advised patient that it is in her best interest to go to the ED due to transportation and current medical situation.

## 2013-03-07 NOTE — Progress Notes (Signed)
Patient ID: [name], female   DOB: 1951-02-23, 62 y.o.   MRN: [id number]  Patient walked into clinic and demanded to see a "doctor".  She had an appointment on 03/04/2013 but was unable to make this appointment due to transportation issues.  Patient was rescheduled for 03/09/2013 to have a cardiac monitor placed.  Patient insists she does not need to have a monitor as she has EKG's and needs to see a doctor.  I advised a monitor is what was ordered, however if she would like to see a provider, I offered to schedule her an appointment this afternoon with Samara Deist, she declined stating she has transportation issues and found a ride this morning.  I explained that the person who places our monitors would be in at 1pm and we could place it then, we could schedule her an appointment to see Samara Deist, she again declined.  I offered to see if they would be able to see her this AM in [place], however, she stated again she had transportation issues.  I was apologetic to the patient and advised her again the options available, she stated she didn't know what to do, made a phone call on her cell phone and left the office without further discussion.    She has transportation through [facility] assistance, however they need 48 hr notice and this is why she was scheduled for Wed.  Patient was extremely hostile and argumentative.  She made comments about prior care in the hospital at [facility].

## 2013-03-07 NOTE — Telephone Encounter (Signed)
I agree witht eh advice given for pt to be seen in the ED since she was unable to transport herself to the cardiology clinic in [facility]

## 2013-03-08 NOTE — Telephone Encounter (Signed)
Was advised by manager YL that the pt called into [facility] and cancelled her appointment, and did not want to follow up KL made aware

## 2013-03-10 DIAGNOSIS — R5381 Other malaise: Secondary | ICD-10-CM | POA: Diagnosis not present

## 2013-03-10 DIAGNOSIS — I498 Other specified cardiac arrhythmias: Secondary | ICD-10-CM | POA: Diagnosis not present

## 2013-03-10 DIAGNOSIS — R5383 Other fatigue: Secondary | ICD-10-CM | POA: Diagnosis not present

## 2013-03-10 DIAGNOSIS — R1013 Epigastric pain: Secondary | ICD-10-CM | POA: Diagnosis not present

## 2013-03-10 DIAGNOSIS — I10 Essential (primary) hypertension: Secondary | ICD-10-CM | POA: Diagnosis not present

## 2013-03-10 DIAGNOSIS — I471 Supraventricular tachycardia: Secondary | ICD-10-CM | POA: Diagnosis not present

## 2013-03-10 DIAGNOSIS — R6889 Other general symptoms and signs: Secondary | ICD-10-CM | POA: Diagnosis not present

## 2013-03-17 DIAGNOSIS — E782 Mixed hyperlipidemia: Secondary | ICD-10-CM | POA: Diagnosis not present

## 2013-03-17 DIAGNOSIS — I119 Hypertensive heart disease without heart failure: Secondary | ICD-10-CM | POA: Diagnosis not present

## 2013-03-17 DIAGNOSIS — I209 Angina pectoris, unspecified: Secondary | ICD-10-CM | POA: Diagnosis not present

## 2013-03-17 DIAGNOSIS — I471 Supraventricular tachycardia: Secondary | ICD-10-CM | POA: Diagnosis not present

## 2013-03-28 ENCOUNTER — Encounter: Payer: Self-pay | Admitting: Adult Health

## 2013-04-06 DIAGNOSIS — Z79899 Other long term (current) drug therapy: Secondary | ICD-10-CM | POA: Diagnosis not present

## 2013-04-06 DIAGNOSIS — G894 Chronic pain syndrome: Secondary | ICD-10-CM | POA: Diagnosis not present

## 2013-04-06 DIAGNOSIS — R51 Headache: Secondary | ICD-10-CM | POA: Diagnosis not present

## 2013-04-13 DIAGNOSIS — R0602 Shortness of breath: Secondary | ICD-10-CM | POA: Diagnosis not present

## 2013-04-17 DIAGNOSIS — I471 Supraventricular tachycardia: Secondary | ICD-10-CM | POA: Diagnosis not present

## 2013-04-22 ENCOUNTER — Other Ambulatory Visit: Payer: Self-pay

## 2013-04-22 MED ORDER — EZETIMIBE 10 MG PO TABS: ORAL_TABLET | ORAL | Status: DC

## 2013-04-25 ENCOUNTER — Ambulatory Visit (HOSPITAL_COMMUNITY): Payer: Medicare Other

## 2013-04-25 ENCOUNTER — Other Ambulatory Visit (HOSPITAL_COMMUNITY): Payer: Medicare Other

## 2013-04-26 DIAGNOSIS — I209 Angina pectoris, unspecified: Secondary | ICD-10-CM | POA: Diagnosis not present

## 2013-04-26 DIAGNOSIS — I471 Supraventricular tachycardia: Secondary | ICD-10-CM | POA: Diagnosis not present

## 2013-04-26 DIAGNOSIS — E782 Mixed hyperlipidemia: Secondary | ICD-10-CM | POA: Diagnosis not present

## 2013-04-26 DIAGNOSIS — I4949 Other premature depolarization: Secondary | ICD-10-CM | POA: Diagnosis not present

## 2013-05-04 DIAGNOSIS — G894 Chronic pain syndrome: Secondary | ICD-10-CM | POA: Diagnosis not present

## 2013-05-04 DIAGNOSIS — G609 Hereditary and idiopathic neuropathy, unspecified: Secondary | ICD-10-CM | POA: Diagnosis not present

## 2013-05-04 DIAGNOSIS — Z79899 Other long term (current) drug therapy: Secondary | ICD-10-CM | POA: Diagnosis not present

## 2013-05-04 DIAGNOSIS — R51 Headache: Secondary | ICD-10-CM | POA: Diagnosis not present

## 2013-05-25 ENCOUNTER — Encounter (HOSPITAL_COMMUNITY): Payer: Medicare Other

## 2013-05-25 ENCOUNTER — Encounter (HOSPITAL_COMMUNITY): Payer: Medicare Other | Attending: Hematology and Oncology

## 2013-05-25 ENCOUNTER — Encounter (HOSPITAL_COMMUNITY): Payer: Self-pay

## 2013-05-25 ENCOUNTER — Ambulatory Visit (HOSPITAL_COMMUNITY): Payer: Medicare Other | Admitting: Oncology

## 2013-05-25 ENCOUNTER — Other Ambulatory Visit (HOSPITAL_COMMUNITY): Payer: Medicare Other

## 2013-05-25 VITALS — BP 122/79 | HR 72 | Temp 97.6°F | Resp 16 | Wt 130.3 lb

## 2013-05-25 DIAGNOSIS — D72829 Elevated white blood cell count, unspecified: Secondary | ICD-10-CM | POA: Insufficient documentation

## 2013-05-25 LAB — CBC WITH DIFFERENTIAL/PLATELET
Basophils Absolute: 0 10*3/uL (ref 0.0–0.1)
Basophils Relative: 0 % (ref 0–1)
HCT: 27.4 % — ABNORMAL LOW (ref 36.0–46.0)
MCHC: 32.5 g/dL (ref 30.0–36.0)
Monocytes Absolute: 1.2 10*3/uL — ABNORMAL HIGH (ref 0.1–1.0)
Neutro Abs: 4.4 10*3/uL (ref 1.7–7.7)
RDW: 16.7 % — ABNORMAL HIGH (ref 11.5–15.5)

## 2013-05-25 NOTE — Progress Notes (Signed)
[facility] [phone]   [phone]  OFFICE PROGRESS NOTE  Syliva Overman, MD [address] [place] [address]  DIAGNOSIS: leukocytosis   INTERVAL HISTORY:   [name] 62 y.o. female returns to the clinic today for follow up of a reactive leukocytosis.  She has a 96-pack-years of smoking and continues to smoke 2 packs of cigarettes a day.  She doesn't that she is eating well and her weight is stable.  She has no new concerns today.  She tells me that she has had protracted sweating which he attributes to her pain medication which he says causes flushing.  She denies fever or chills  Or shortness of breath.  That me she had tachycardia in April 2014 and see Dr Gwen Pounds at the [facility]. She showed me EKG tracing to this effect.  MEDICAL HISTORY: Past Medical History  Diagnosis Date  . Hepatitis C 1993     Needs Hepatic panel every 6   months, treated for  1 year   . Hyperlipidemia 2001  . HEARING LOSS     since age 6  . Hypertension 2001  . Sleep apnea 2001    non compliant wit the use of the machine  . Tinnitus 2006    disabling  . Fracture 2006    left foot & ankle , immobilized for healing   . Diverticulosis 2008    diagnosed   . Panic disorder     was followed by mental health  . Diverticulitis     pt reports 8 times. Dr. Leticia Penna colectomy in 2009  . Hiatal hernia     Per medical history form dated 05/02/11. Repaired.  . Night sweats     Per medical history form dated 05/02/11.  Marland Kitchen SOB (shortness of breath)     Per medical history form dated 05/02/11.  . Wears glasses   . Arthritis     Per medical history form dated 05/02/11.  . Menopause     per medical history form  . Generalized headaches     Patient stated they are felt in back of the head, not throbing. But always in same spot. MRI's done, no reason why they occur.  . Gout     Recently diagnosed.  . Wears dentures     Per medical  history form dated 05/02/11.  . Anemia due to blood loss, acute 01/28/2012  . Acute GI bleeding 01/28/2012  . 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 Diastolic dysfunction 01/28/2012    Grade 1  . Sleep apnea     wear oxygen at bedtime.     ALLERGIES:  is allergic to aspirin; tramadol hcl; willow bark; willow leaf swallow wort rhizome; benadryl; codeine; prednisone; and promethazine hcl.  MEDICATIONS:  Current Outpatient Prescriptions  Medication Sig Dispense Refill  . atorvastatin (LIPITOR) 20 MG tablet Take 20 mg by mouth daily.      . clopidogrel (PLAVIX) 75 MG tablet Take 0.5 tablets (37.5 mg total) by mouth every morning. RESTART THIS MEDICATION ON MARCH 23RD 2013. One half daily per patient      . cyclobenzaprine (FLEXERIL) 10 MG tablet Take 10 mg by mouth 3 (three) times daily as needed.      . diltiazem (CARDIZEM CD) 120 MG 24 hr capsule Take 1 capsule (120 mg total) by mouth daily. For blood pressure  30 capsule  11  .  docusate sodium (COLACE) 100 MG capsule Take 100 mg by mouth 2 (two) times daily.      Marland Kitchen EPIPEN 2-PAK 0.3 MG/0.3ML DEVI USE AS DIRECTED  2 each  0  . HYDROcodone-acetaminophen (NORCO) 7.5-325 MG per tablet Take 1 tablet by mouth every 6 (six) hours as needed for pain.      . metoprolol succinate (TOPROL-XL) 25 MG 24 hr tablet 1 tablet daily. for palpitations  30 tablet  11  . neomycin-bacitracin-polymyxin (POLYSPORIN) ophthalmic ointment Place into both eyes as needed.        . simvastatin (ZOCOR) 20 MG tablet Take 20 mg by mouth at bedtime.      Marland Kitchen tiotropium (SPIRIVA) 18 MCG inhalation capsule Place 18 mcg into inhaler and inhale daily.      Marland Kitchen ZYRTEC 10 MG tablet TAKE (1) TABLET BY MOUTH ONCE DAILY AS NEEDED FOR HIVES.  30 each  5  . [DISCONTINUED] amLODipine (NORVASC) 5 MG tablet Take 5 mg by mouth daily.         No current facility-administered medications for this visit.    SURGICAL HISTORY:  Past Surgical History  Procedure Laterality  Date  . Tonsilllectomy  1955  . Vocal cord biopsy  2009    pt reports she had voice loss, reports that she had precancerous lesions on the throat   . Hiatal hernia repair  2003  . Appendectomy in teens    . Total abdominal hysterectomy w/ bilateral salpingoophorectomy  March 2006    Non Cancerous   . Abdominal hernia repair x2 more    . Left elbow surgery  1999  . R ankle surgery x5 in  1993-1999  . Partial colectomy  2009    PT. REPORTS THAT SHE HAS HAD 8 INFECTIOS PREVO\IOUSLY WHICH REQUIRED SURGERY  . Umbilical hernis repair  March 24,2010  . Hernia repair      incisional  . Colonoscopy  Sept 2009    SLF: frequent sigmoid colon and descending colon diverticula, thickened walls in sigmoid, small internal hemorrhoids, colon polyp: hyperplastic, normal random biopsies  . Esophagogastroduodenoscopy  March 2009    SLF: normal esophagus, gastric erosion, benign path     REVIEW OF SYSTEMS: 14 point review of system is as in the history above otherwise negative.    PHYSICAL EXAMINATION:  Blood pressure 122/79, pulse 72, temperature 97.6 F (36.4 C), temperature source Oral, resp. rate 16, weight 130 lb 4.8 oz (59.104 kg). GENERAL: No distress. SKIN:  No rashes or significant lesions  HEAD: Normocephalic, No masses, lesions, tenderness or abnormalities  EYES: Conjunctiva are pink and non-injected  LYMPH: No palpable lymphadenopathy, the neck or supraclavicular areas. LUNGS: clear to auscultation , no crackles or wheezes HEART: regular rate & rhythm, no murmurs, no gallops, S1 normal and S2 normal  ABDOMEN: Abdomen soft, non-tender, normal bowel sounds, no masses or organomegaly and no hepatosplenomegaly palpable EXTREMITIES: No edema, no skin discoloration or tenderness NEURO: alert & oriented , no focal motor/sensory deficits.     LABORATORY DATA: Lab Results  Component Value Date   WBC 12.2* 10/22/2012   HGB 16.1* 10/22/2012   HCT 47.7* 10/22/2012   MCV 94.1 10/22/2012    PLT 224 10/22/2012      Chemistry      Component Value Date/Time   NA 141 09/29/2012 1118   K 4.9 09/29/2012 1118   CL 102 09/29/2012 1118   CO2 33* 09/29/2012 1118   BUN 11 09/29/2012 1118   CREATININE 0.61 09/29/2012  1118   CREATININE 0.58 01/29/2012 0533      Component Value Date/Time   CALCIUM 10.2 09/29/2012 1118   ALKPHOS 60 09/29/2012 1118   AST 19 09/29/2012 1118   ALT 16 09/29/2012 1118   BILITOT 0.3 09/29/2012 1118     CBC today pending.  We'll review later when available.  RADIOGRAPHIC STUDIES: No results found.   ASSESSMENT:  Ms. Garmon looks clinically stable as regards her leukocytosis which is most likely related to  heavy  smoking .  I counseled her on smoking cessation.  She is aware of the risk of continued smoking including but not limited to cardiovascular disease, lung disease and malignancy.   PLAN:  1. She'll return to clinic in 6 months for her routine follow up. 2. Follow up with a CBC today. 3. We  will inform her if any new concerns in her CBC today. 4.  Patient will follow with her other doctors as scheduled.     All questions were satisfactorily answered. Patient knows to call if  any concern arises.  I spent more than 50 % counseling the patient face to face. The total time spent in the appointment was 30 minutes.   Sherral Hammers, MD FACP. Hematology/Oncology.

## 2013-05-25 NOTE — Patient Instructions (Addendum)
[facility] Discharge Instructions  RECOMMENDATIONS MADE BY THE CONSULTANT AND ANY TEST RESULTS WILL BE SENT TO YOUR REFERRING PHYSICIAN.  EXAM FINDINGS BY THE PHYSICIAN TODAY AND SIGNS OR SYMPTOMS TO REPORT TO CLINIC OR PRIMARY PHYSICIAN: Exam and discussion by Dr. Sharia Reeve.  If there's problems with your blood work, we will call you.   SPECIAL INSTRUCTIONS/FOLLOW-UP: Follow-up in 6 months.  Thank you for choosing [facility] to provide your oncology and hematology care.  To afford each patient quality time with our providers, please arrive at least 15 minutes before your scheduled appointment time.  With your help, our goal is to use those 15 minutes to complete the necessary work-up to ensure our physicians have the information they need to help with your evaluation and healthcare recommendations.    Effective January 1st, 2014, we ask that you re-schedule your appointment with our physicians should you arrive 10 or more minutes late for your appointment.  We strive to give you quality time with our providers, and arriving late affects you and other patients whose appointments are after yours.    Again, thank you for choosing [facility].  Our hope is that these requests will decrease the amount of time that you wait before being seen by our physicians.       _____________________________________________________________  Should you have questions after your visit to [facility], please contact our office at [phone] between the hours of 8:30 a.m. and 5:00 p.m.  Voicemails left after 4:30 p.m. will not be returned until the following business day.  For prescription refill requests, have your pharmacy contact our office with your prescription refill request.

## 2013-05-26 ENCOUNTER — Other Ambulatory Visit (HOSPITAL_COMMUNITY): Payer: Self-pay | Admitting: Oncology

## 2013-05-26 DIAGNOSIS — D649 Anemia, unspecified: Secondary | ICD-10-CM

## 2013-06-06 DIAGNOSIS — G4700 Insomnia, unspecified: Secondary | ICD-10-CM | POA: Diagnosis not present

## 2013-06-06 DIAGNOSIS — G894 Chronic pain syndrome: Secondary | ICD-10-CM | POA: Diagnosis not present

## 2013-06-06 DIAGNOSIS — R5383 Other fatigue: Secondary | ICD-10-CM | POA: Diagnosis not present

## 2013-06-06 DIAGNOSIS — Z79899 Other long term (current) drug therapy: Secondary | ICD-10-CM | POA: Diagnosis not present

## 2013-06-22 ENCOUNTER — Encounter: Payer: Self-pay | Admitting: Family Medicine

## 2013-06-22 ENCOUNTER — Other Ambulatory Visit: Payer: Self-pay | Admitting: Family Medicine

## 2013-06-22 ENCOUNTER — Other Ambulatory Visit (HOSPITAL_COMMUNITY): Payer: Self-pay | Admitting: Oncology

## 2013-06-22 ENCOUNTER — Ambulatory Visit (INDEPENDENT_AMBULATORY_CARE_PROVIDER_SITE_OTHER): Payer: Medicare Other | Admitting: Family Medicine

## 2013-06-22 ENCOUNTER — Telehealth (HOSPITAL_COMMUNITY): Payer: Self-pay | Admitting: Oncology

## 2013-06-22 VITALS — BP 120/64 | HR 82 | Resp 16 | Wt 129.0 lb

## 2013-06-22 DIAGNOSIS — D539 Nutritional anemia, unspecified: Secondary | ICD-10-CM

## 2013-06-22 DIAGNOSIS — F17200 Nicotine dependence, unspecified, uncomplicated: Secondary | ICD-10-CM

## 2013-06-22 DIAGNOSIS — E785 Hyperlipidemia, unspecified: Secondary | ICD-10-CM

## 2013-06-22 DIAGNOSIS — I10 Essential (primary) hypertension: Secondary | ICD-10-CM

## 2013-06-22 DIAGNOSIS — K5900 Constipation, unspecified: Secondary | ICD-10-CM | POA: Diagnosis not present

## 2013-06-22 DIAGNOSIS — Z1211 Encounter for screening for malignant neoplasm of colon: Secondary | ICD-10-CM

## 2013-06-22 DIAGNOSIS — G894 Chronic pain syndrome: Secondary | ICD-10-CM

## 2013-06-22 DIAGNOSIS — E538 Deficiency of other specified B group vitamins: Secondary | ICD-10-CM

## 2013-06-22 DIAGNOSIS — K5730 Diverticulosis of large intestine without perforation or abscess without bleeding: Secondary | ICD-10-CM

## 2013-06-22 DIAGNOSIS — K5790 Diverticulosis of intestine, part unspecified, without perforation or abscess without bleeding: Secondary | ICD-10-CM

## 2013-06-22 DIAGNOSIS — Z720 Tobacco use: Secondary | ICD-10-CM

## 2013-06-22 LAB — HEMOCCULT GUIAC POC 1CARD (OFFICE): Fecal Occult Blood, POC: NEGATIVE

## 2013-06-22 LAB — CBC WITH DIFFERENTIAL/PLATELET
Basophils Absolute: 0 10*3/uL (ref 0.0–0.1)
Basophils Relative: 0 % (ref 0–1)
Hemoglobin: 17.1 g/dL — ABNORMAL HIGH (ref 12.0–15.0)
MCHC: 34.5 g/dL (ref 30.0–36.0)
Monocytes Relative: 7 % (ref 3–12)
Neutro Abs: 8 10*3/uL — ABNORMAL HIGH (ref 1.7–7.7)
Neutrophils Relative %: 66 % (ref 43–77)
RDW: 13.9 % (ref 11.5–15.5)

## 2013-06-22 LAB — ANEMIA PANEL
%SAT: 24 % (ref 20–55)
ABS Retic: 63.2 10*3/uL (ref 19.0–186.0)
Iron: 78 ug/dL (ref 42–145)
TIBC: 330 ug/dL (ref 250–470)

## 2013-06-22 MED ORDER — BUPROPION HCL ER (SMOKING DET) 150 MG PO TB12: 150.0000 mg | ORAL_TABLET | Freq: Two times a day (BID) | ORAL | Status: DC

## 2013-06-22 MED ORDER — POLYETHYLENE GLYCOL 3350 17 GM/SCOOP PO POWD: 17.0000 g | Freq: Every day | ORAL | Status: DC

## 2013-06-22 NOTE — Telephone Encounter (Signed)
I spokoe with Dr. Lodema Hong who repeated lab work the other day.  The patient's labs are reviewed.  I suspect that her July 2014 labs were ibncorrectly tested in the lab resulting in abnormal lab results.  We will cancel lab appointment for 06/24/2013.  Dr. Lodema Hong and I both agree that an anemia panel is not indicated and Dr. Lodema Hong reports that she will cancel these tests.    We will repeat labs in 6 months as scheduled.   KEFALAS,THOMAS

## 2013-06-22 NOTE — Patient Instructions (Addendum)
F/u in November, call if you need me before  Flu vaccine available in October  You will be referred back to the GI Doc re blood loss  Rectal today shows no hidden blood  Stat labs today CBC, ferritin, iron, tibc, folate, B12, and will be sent  To hematology also. Expect a message re labs today on "my chart", call if needed  I will also let your oncologist get the results  I am contacting your cardiologist , Dr Gwen Pounds in [place] re your low blood count also  New for constipation is miralax daily, continue stool softener, and OK to use small amt, (least effective dose) of magnesia every 4 days if no bowel movement  New to help with smoking cessation , zyban, take one daily forr 3 days then 1 twice daily. Plan to quit in 2 weeks for best results, certainly by September 1

## 2013-06-22 NOTE — Progress Notes (Signed)
  Subjective:    Patient ID: [name], female    DOB: May 18, 1951, 62 y.o.   MRN: [id number]  HPI The PT is here for follow up and re-evaluation of chronic medical conditions, medication management and review of any available recent lab and radiology data.  Preventive health is updated, specifically  Cancer screening and Immunization.   Pt has established with a new cardiology group in [place], resently experienced palpitations and subsequently transferred her care there. Has report stating significant fall in hB, has had this in the past 2 years with negative GI eval.I will rept the lab, and refer to heamtology where she is already a pt. And to her GI doc also if rept is abnormal C/o constipation, states prunes would help her but unable to afford, uses MOM as needed. The PT denies any adverse reactions to current medications since the last visit.  Has had zetia discontinued and has anxiety with this , I reassure her to reduce fat intake and follow up with rept lab    Review of Systems See HPI Denies recent fever or chills. Denies sinus pressure, nasal congestion, ear pain or sore throat.Allergies controlled with zyrtec Denies chest congestion, productive cough or wheezing. Denies chest pains, palpitations and leg swelling Denies abdominal pain, nausea, vomiting,diarrhea or constipation.   Denies dysuria, frequency, hesitancy or incontinence. Denies uncontrolled  joint pain, swelling and limitation in mobility. Denies headaches, seizures, numbness, or tingling. Denies depression, anxiety is markedly improved, denies insomnia. Denies skin break down or rash.        Objective:   Physical Exam  Patient alert and oriented and in no cardiopulmonary distress.  HEENT: No facial asymmetry, EOMI, no sinus tenderness,  oropharynx pink and moist.  Neck supple no adenopathy.  Chest: Clear to auscultation bilaterally.Decreased thoiugh adequate air entry  CVS: S1, S2 no murmurs, no  S3.  ABD: Soft non tender. Bowel sounds normal. Rectal: no mass, heme negative stool Ext: No edema  MS: Adequate ROM spine, shoulders, hips and knees.  Skin: Intact, no ulcerations or rash noted.  Psych: Good eye contact, normal affect. Memory intact not anxious or depressed appearing.  CNS: CN 2-12 intact, power, tone and sensation normal throughout.       Assessment & Plan:

## 2013-06-23 ENCOUNTER — Other Ambulatory Visit (HOSPITAL_COMMUNITY): Payer: Medicare Other

## 2013-06-24 ENCOUNTER — Other Ambulatory Visit (HOSPITAL_COMMUNITY): Payer: Medicare Other

## 2013-06-30 DIAGNOSIS — E782 Mixed hyperlipidemia: Secondary | ICD-10-CM | POA: Diagnosis not present

## 2013-06-30 DIAGNOSIS — I4891 Unspecified atrial fibrillation: Secondary | ICD-10-CM | POA: Diagnosis not present

## 2013-06-30 DIAGNOSIS — I059 Rheumatic mitral valve disease, unspecified: Secondary | ICD-10-CM | POA: Diagnosis not present

## 2013-06-30 DIAGNOSIS — I119 Hypertensive heart disease without heart failure: Secondary | ICD-10-CM | POA: Diagnosis not present

## 2013-07-04 DIAGNOSIS — G4700 Insomnia, unspecified: Secondary | ICD-10-CM | POA: Diagnosis not present

## 2013-07-04 DIAGNOSIS — Z79899 Other long term (current) drug therapy: Secondary | ICD-10-CM | POA: Diagnosis not present

## 2013-07-04 DIAGNOSIS — R269 Unspecified abnormalities of gait and mobility: Secondary | ICD-10-CM | POA: Diagnosis not present

## 2013-07-04 DIAGNOSIS — G894 Chronic pain syndrome: Secondary | ICD-10-CM | POA: Diagnosis not present

## 2013-07-04 DIAGNOSIS — R5381 Other malaise: Secondary | ICD-10-CM | POA: Diagnosis not present

## 2013-07-06 ENCOUNTER — Encounter: Payer: Self-pay | Admitting: Family Medicine

## 2013-07-07 NOTE — Assessment & Plan Note (Signed)
Recent lab report of very low HB from outside Doc, will rept stat, heme negative stool at visit

## 2013-07-07 NOTE — Assessment & Plan Note (Signed)
Controlled and managed by pain specialist

## 2013-07-07 NOTE — Assessment & Plan Note (Signed)
Controlled, no change in medication DASH diet and commitment to daily physical activity for a minimum of 30 minutes discussed and encouraged, as a part of hypertension management. The importance of attaining a healthy weight is also discussed.  

## 2013-07-07 NOTE — Assessment & Plan Note (Signed)
Hyperlipidemia:Low fat diet discussed and encouraged.  Updated lab to be obtained from cardiology office

## 2013-07-07 NOTE — Assessment & Plan Note (Signed)
Ready to quit. Pt to start zynban Patient counseled for approximately 5 minutes regarding the health risks of ongoing nicotine use, specifically all types of cancer, heart disease, stroke and respiratory failure. The options available for help with cessation ,the behavioral changes to assist the process, and the option to either gradully reduce usage  Or abruptly stop.is also discussed. Pt is also encouraged to set specific goals in number of cigarettes used daily, as well as to set a quit date.

## 2013-07-07 NOTE — Assessment & Plan Note (Signed)
Uncontrolled, counselled re appropriate diet, and use of mirilax , stool softener and laxative every 4 days if needed.

## 2013-07-27 DIAGNOSIS — R0902 Hypoxemia: Secondary | ICD-10-CM | POA: Diagnosis not present

## 2013-07-27 DIAGNOSIS — G4730 Sleep apnea, unspecified: Secondary | ICD-10-CM | POA: Diagnosis not present

## 2013-07-27 DIAGNOSIS — J449 Chronic obstructive pulmonary disease, unspecified: Secondary | ICD-10-CM | POA: Diagnosis not present

## 2013-07-29 ENCOUNTER — Telehealth: Payer: Self-pay | Admitting: Family Medicine

## 2013-07-29 NOTE — Telephone Encounter (Signed)
Ps contact [facility] with office note from August and also the result of recent overnight pulse ox study so that she can continue her nocturnal oxygen, thanks

## 2013-07-29 NOTE — Progress Notes (Signed)
  Subjective:    Patient ID: [name], female    DOB: 10/01/1951, 62 y.o.   MRN: [id number]  HPI Patient contacted the office in September requiring documentation of ongoing need for nocturnal oxygen. She does have COPD and continues to smoke, though is trying to quit Overnight pulse oximetry test 07/27/2013   Review of Systems     Objective:   Physical Exam  Test results show lowest SpO2 of 85%, and that for 1 hour and 6 minutes her SpO2 was less than 88%    Assessment & Plan:  Qualifies for supplemental oxygen while asleep

## 2013-08-01 DIAGNOSIS — R269 Unspecified abnormalities of gait and mobility: Secondary | ICD-10-CM | POA: Diagnosis not present

## 2013-08-01 DIAGNOSIS — Z79899 Other long term (current) drug therapy: Secondary | ICD-10-CM | POA: Diagnosis not present

## 2013-08-01 DIAGNOSIS — G894 Chronic pain syndrome: Secondary | ICD-10-CM | POA: Diagnosis not present

## 2013-08-02 NOTE — Telephone Encounter (Signed)
Faxed to [facility]  

## 2013-08-03 ENCOUNTER — Other Ambulatory Visit: Payer: Self-pay | Admitting: *Deleted

## 2013-08-03 DIAGNOSIS — Z79899 Other long term (current) drug therapy: Secondary | ICD-10-CM | POA: Diagnosis not present

## 2013-08-03 DIAGNOSIS — G894 Chronic pain syndrome: Secondary | ICD-10-CM | POA: Diagnosis not present

## 2013-08-03 MED ORDER — METOPROLOL SUCCINATE ER 25 MG PO TB24: ORAL_TABLET | ORAL | Status: DC

## 2013-08-05 ENCOUNTER — Ambulatory Visit (INDEPENDENT_AMBULATORY_CARE_PROVIDER_SITE_OTHER): Payer: Medicare Other

## 2013-08-05 DIAGNOSIS — Z23 Encounter for immunization: Secondary | ICD-10-CM | POA: Diagnosis not present

## 2013-08-19 ENCOUNTER — Encounter: Payer: Self-pay | Admitting: Family Medicine

## 2013-09-13 DIAGNOSIS — I10 Essential (primary) hypertension: Secondary | ICD-10-CM | POA: Diagnosis not present

## 2013-09-13 DIAGNOSIS — I498 Other specified cardiac arrhythmias: Secondary | ICD-10-CM | POA: Diagnosis not present

## 2013-09-13 DIAGNOSIS — G4733 Obstructive sleep apnea (adult) (pediatric): Secondary | ICD-10-CM | POA: Diagnosis not present

## 2013-09-13 DIAGNOSIS — M25579 Pain in unspecified ankle and joints of unspecified foot: Secondary | ICD-10-CM | POA: Diagnosis not present

## 2013-09-13 DIAGNOSIS — R0789 Other chest pain: Secondary | ICD-10-CM | POA: Diagnosis not present

## 2013-09-13 DIAGNOSIS — R748 Abnormal levels of other serum enzymes: Secondary | ICD-10-CM | POA: Diagnosis not present

## 2013-09-13 DIAGNOSIS — R079 Chest pain, unspecified: Secondary | ICD-10-CM | POA: Diagnosis not present

## 2013-09-13 DIAGNOSIS — R000 Tachycardia, unspecified: Secondary | ICD-10-CM | POA: Diagnosis not present

## 2013-09-13 DIAGNOSIS — E785 Hyperlipidemia, unspecified: Secondary | ICD-10-CM | POA: Diagnosis not present

## 2013-09-13 DIAGNOSIS — I471 Supraventricular tachycardia: Secondary | ICD-10-CM | POA: Diagnosis not present

## 2013-09-13 DIAGNOSIS — E876 Hypokalemia: Secondary | ICD-10-CM | POA: Diagnosis not present

## 2013-09-13 DIAGNOSIS — F17200 Nicotine dependence, unspecified, uncomplicated: Secondary | ICD-10-CM | POA: Diagnosis not present

## 2013-09-13 DIAGNOSIS — J449 Chronic obstructive pulmonary disease, unspecified: Secondary | ICD-10-CM | POA: Diagnosis not present

## 2013-09-13 DIAGNOSIS — R002 Palpitations: Secondary | ICD-10-CM | POA: Diagnosis not present

## 2013-09-13 DIAGNOSIS — I2510 Atherosclerotic heart disease of native coronary artery without angina pectoris: Secondary | ICD-10-CM | POA: Diagnosis not present

## 2013-09-14 DIAGNOSIS — R002 Palpitations: Secondary | ICD-10-CM | POA: Diagnosis not present

## 2013-09-14 DIAGNOSIS — R748 Abnormal levels of other serum enzymes: Secondary | ICD-10-CM | POA: Diagnosis not present

## 2013-09-14 DIAGNOSIS — R079 Chest pain, unspecified: Secondary | ICD-10-CM | POA: Diagnosis not present

## 2013-09-14 DIAGNOSIS — I471 Supraventricular tachycardia: Secondary | ICD-10-CM | POA: Diagnosis not present

## 2013-09-14 DIAGNOSIS — I498 Other specified cardiac arrhythmias: Secondary | ICD-10-CM | POA: Diagnosis not present

## 2013-09-16 DIAGNOSIS — I10 Essential (primary) hypertension: Secondary | ICD-10-CM | POA: Diagnosis not present

## 2013-09-16 DIAGNOSIS — R079 Chest pain, unspecified: Secondary | ICD-10-CM | POA: Diagnosis not present

## 2013-09-16 DIAGNOSIS — I498 Other specified cardiac arrhythmias: Secondary | ICD-10-CM | POA: Diagnosis not present

## 2013-09-16 DIAGNOSIS — F17200 Nicotine dependence, unspecified, uncomplicated: Secondary | ICD-10-CM | POA: Diagnosis not present

## 2013-09-19 DIAGNOSIS — F17200 Nicotine dependence, unspecified, uncomplicated: Secondary | ICD-10-CM | POA: Diagnosis not present

## 2013-09-19 DIAGNOSIS — I10 Essential (primary) hypertension: Secondary | ICD-10-CM | POA: Diagnosis not present

## 2013-09-19 DIAGNOSIS — I471 Supraventricular tachycardia: Secondary | ICD-10-CM | POA: Diagnosis not present

## 2013-09-20 ENCOUNTER — Telehealth: Payer: Self-pay | Admitting: Family Medicine

## 2013-09-20 DIAGNOSIS — I471 Supraventricular tachycardia: Secondary | ICD-10-CM

## 2013-09-20 DIAGNOSIS — R000 Tachycardia, unspecified: Secondary | ICD-10-CM

## 2013-09-20 NOTE — Telephone Encounter (Signed)
Message handed to me today,  Faxed on day of visit 09/19/2013,with notes from OV at [facility] cardiology clinic, requesting referral to EP specialist as pt has had recurrent episodes of SVT  Pls refer to Electrophysiology cardiologist with [facility], pls fax notes from the [place] clinic, and explain to scheduler that [place]

## 2013-09-22 ENCOUNTER — Telehealth: Payer: Self-pay | Admitting: Family Medicine

## 2013-09-22 NOTE — Telephone Encounter (Signed)
Patient is aware of this also spoke with her today

## 2013-09-26 DIAGNOSIS — Z79899 Other long term (current) drug therapy: Secondary | ICD-10-CM | POA: Diagnosis not present

## 2013-09-26 DIAGNOSIS — F411 Generalized anxiety disorder: Secondary | ICD-10-CM | POA: Diagnosis not present

## 2013-09-26 DIAGNOSIS — G894 Chronic pain syndrome: Secondary | ICD-10-CM | POA: Diagnosis not present

## 2013-09-27 DIAGNOSIS — Z79899 Other long term (current) drug therapy: Secondary | ICD-10-CM | POA: Diagnosis not present

## 2013-09-27 DIAGNOSIS — G894 Chronic pain syndrome: Secondary | ICD-10-CM | POA: Diagnosis not present

## 2013-10-05 ENCOUNTER — Encounter: Payer: Self-pay | Admitting: *Deleted

## 2013-10-10 ENCOUNTER — Encounter: Payer: Self-pay | Admitting: Internal Medicine

## 2013-10-10 ENCOUNTER — Ambulatory Visit (INDEPENDENT_AMBULATORY_CARE_PROVIDER_SITE_OTHER): Payer: Medicare Other | Admitting: Internal Medicine

## 2013-10-10 VITALS — BP 129/78 | HR 85 | Ht 62.0 in | Wt 125.8 lb

## 2013-10-10 DIAGNOSIS — I471 Supraventricular tachycardia: Secondary | ICD-10-CM | POA: Diagnosis not present

## 2013-10-10 DIAGNOSIS — R000 Tachycardia, unspecified: Secondary | ICD-10-CM

## 2013-10-10 DIAGNOSIS — R002 Palpitations: Secondary | ICD-10-CM
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
## 2013-10-10 NOTE — Patient Instructions (Addendum)
Your physician recommends that you continue on your current medications as directed. Please refer to the Current Medication list given to you today.  Your physician has recommended that you have an ablation. Catheter ablation is a medical procedure used to treat some cardiac arrhythmias (irregular heartbeats). During catheter ablation, a long, thin, flexible tube is put into a blood vessel in your groin (upper thigh), or neck. This tube is called an ablation catheter. It is then guided to your heart through the blood vessel. Radio frequency waves destroy small areas of heart tissue where abnormal heartbeats may cause an arrhythmia to start. Please see the instruction sheet given to you today.  Date available: (subject to change) - December 10, 11, 17, 19 Please call Dennis Bast (Dr. Lubertha Basque nurse) to schedule procedure [phone]  Your physician recommends that you have pre-procedure lab work prior to procedure: to be determined once procedure date is set.  Stop your Metoprolol and Diltiazem 2 days prior to procedure.

## 2013-10-10 NOTE — Assessment & Plan Note (Signed)
The patient has had increasingly frequent episodes of SVT. This is despite medical therapy with beta blockers and calcium channel blockers. I discussed the treatment options with the patient. The risk, goals, deficits, and expectations of catheter ablation have been discussed with the patient and she wishes to proceed. This be scheduled in the next few weeks.

## 2013-10-11 ENCOUNTER — Telehealth: Payer: Self-pay | Admitting: Internal Medicine

## 2013-10-11 NOTE — Telephone Encounter (Signed)
Follow Up ° °Pt returned call//  °

## 2013-10-11 NOTE — Telephone Encounter (Signed)
Follow up     Pt called back to schedule procedure.  Please give her a call, date 12/11 is ok with her.    Call home first then Cell [phone]

## 2013-10-13 DIAGNOSIS — R000 Tachycardia, unspecified: Secondary | ICD-10-CM | POA: Diagnosis not present

## 2013-10-13 NOTE — Telephone Encounter (Signed)
Follow up    Want Tresa Endo to know that her lab work has been done today at [facility] with info to fax results  to [place]

## 2013-10-14 ENCOUNTER — Other Ambulatory Visit: Payer: Self-pay | Admitting: *Deleted

## 2013-10-14 NOTE — Telephone Encounter (Signed)
Confirmed patient had her labs drawn yesterday at [facility]  [phone].  She will be at the hospital on 10/20/13 at 7:30am for a 9:30 case  Aware to hold Metoprolol for 48 hours

## 2013-10-15 ENCOUNTER — Encounter: Payer: Self-pay | Admitting: Internal Medicine

## 2013-10-15 ENCOUNTER — Encounter (HOSPITAL_COMMUNITY): Payer: Self-pay | Admitting: Pharmacy Technician

## 2013-10-20 ENCOUNTER — Ambulatory Visit (HOSPITAL_COMMUNITY)
Admission: RE | Admit: 2013-10-20 | Discharge: 2013-10-20 | Disposition: A | Discharge status: Home / Self Care | Payer: Medicare Other | Source: Ambulatory Visit | Attending: Internal Medicine | Admitting: Internal Medicine

## 2013-10-20 ENCOUNTER — Encounter (HOSPITAL_COMMUNITY): Payer: Self-pay | Admitting: *Deleted

## 2013-10-20 ENCOUNTER — Encounter (HOSPITAL_COMMUNITY)
Admission: RE | Disposition: A | Discharge status: Home / Self Care | Payer: Self-pay | Source: Ambulatory Visit | Attending: Internal Medicine

## 2013-10-20 DIAGNOSIS — I10 Essential (primary) hypertension: Secondary | ICD-10-CM | POA: Diagnosis not present

## 2013-10-20 DIAGNOSIS — E785 Hyperlipidemia, unspecified: Secondary | ICD-10-CM | POA: Diagnosis not present

## 2013-10-20 DIAGNOSIS — I498 Other specified cardiac arrhythmias: Secondary | ICD-10-CM | POA: Diagnosis not present

## 2013-10-20 DIAGNOSIS — G4730 Sleep apnea, unspecified: Secondary | ICD-10-CM | POA: Insufficient documentation

## 2013-10-20 DIAGNOSIS — I471 Supraventricular tachycardia, unspecified: Secondary | ICD-10-CM

## 2013-10-20 DIAGNOSIS — B1920 Unspecified viral hepatitis C without hepatic coma: Secondary | ICD-10-CM | POA: Insufficient documentation

## 2013-10-20 DIAGNOSIS — Z7902 Long term (current) use of antithrombotics/antiplatelets: Secondary | ICD-10-CM | POA: Diagnosis not present

## 2013-10-20 DIAGNOSIS — I4719 Other supraventricular tachycardia: Secondary | ICD-10-CM | POA: Diagnosis present

## 2013-10-20 DIAGNOSIS — Z23 Encounter for immunization: Secondary | ICD-10-CM | POA: Diagnosis not present

## 2013-10-20 DIAGNOSIS — I519 Heart disease, unspecified: Secondary | ICD-10-CM | POA: Insufficient documentation

## 2013-10-20 HISTORY — DX: Other complications of anesthesia, initial encounter: T88.59XA

## 2013-10-20 HISTORY — DX: Unspecified chronic bronchitis: J42

## 2013-10-20 HISTORY — DX: Headache: R51

## 2013-10-20 HISTORY — DX: Headache, unspecified: R51.9

## 2013-10-20 HISTORY — DX: Chronic obstructive pulmonary disease, unspecified: J44.9

## 2013-10-20 HISTORY — DX: Supraventricular tachycardia, unspecified: I47.10

## 2013-10-20 HISTORY — DX: Adverse effect of unspecified anesthetic, initial encounter: T41.45XA

## 2013-10-20 HISTORY — DX: Dependence on supplemental oxygen: Z99.81

## 2013-10-20 HISTORY — DX: Personal history of other medical treatment: Z92.89

## 2013-10-20 HISTORY — DX: Supraventricular tachycardia: I47.1

## 2013-10-20 HISTORY — PX: ABLATION: SHX5711

## 2013-10-20 HISTORY — PX: SUPRAVENTRICULAR TACHYCARDIA ABLATION: SHX5492

## 2013-10-20 HISTORY — DX: Low back pain: M54.5

## 2013-10-20 HISTORY — DX: Low back pain, unspecified: M54.50

## 2013-10-20 HISTORY — PX: SUPRAVENTRICULAR TACHYCARDIA ABLATION: SHX6106

## 2013-10-20 HISTORY — DX: Other chronic pain: G89.29

## 2013-10-20 HISTORY — DX: Fibromyalgia: M79.7

## 2013-10-20 SURGERY — SUPRAVENTRICULAR TACHYCARDIA ABLATION
Anesthesia: LOCAL

## 2013-10-20 MED ORDER — MIDAZOLAM HCL 5 MG/5ML IJ SOLN
INTRAMUSCULAR | Status: AC
  Filled 2013-10-20: qty 5

## 2013-10-20 MED ORDER — PNEUMOCOCCAL VAC POLYVALENT 25 MCG/0.5ML IJ INJ
0.5000 mL | INJECTION | Freq: Once | INTRAMUSCULAR | Status: AC
  Administered 2013-10-20: 0.5 mL via INTRAMUSCULAR
  Filled 2013-10-20 (×2): qty 0.5

## 2013-10-20 MED ORDER — SODIUM CHLORIDE 0.9 % IJ SOLN: 3.0000 mL | Freq: Two times a day (BID) | INTRAMUSCULAR | Status: DC

## 2013-10-20 MED ORDER — ONDANSETRON HCL 4 MG/2ML IJ SOLN: 4.0000 mg | Freq: Four times a day (QID) | INTRAMUSCULAR | Status: DC | PRN

## 2013-10-20 MED ORDER — BUPIVACAINE HCL (PF) 0.25 % IJ SOLN
INTRAMUSCULAR | Status: AC
  Filled 2013-10-20: qty 60

## 2013-10-20 MED ORDER — OFF THE BEAT BOOK
Freq: Once | Status: DC
  Filled 2013-10-20: qty 1

## 2013-10-20 MED ORDER — SODIUM CHLORIDE 0.9 % IV SOLN: 250.0000 mL | INTRAVENOUS | Status: DC | PRN

## 2013-10-20 MED ORDER — ACETAMINOPHEN 325 MG PO TABS: 650.0000 mg | ORAL_TABLET | ORAL | Status: DC | PRN

## 2013-10-20 MED ORDER — FENTANYL CITRATE 0.05 MG/ML IJ SOLN
INTRAMUSCULAR | Status: AC
  Filled 2013-10-20: qty 2

## 2013-10-20 MED ORDER — SODIUM CHLORIDE 0.9 % IJ SOLN: 3.0000 mL | INTRAMUSCULAR | Status: DC | PRN
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
## 2013-10-20 NOTE — Discharge Summary (Signed)
CARDIOLOGY DISCHARGE SUMMARY   Patient ID: [name] MRN: [id number] DOB/AGE: 12-12-1950 62 y.o.  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 [id number]  Hospital Course: [name] is a 62 y.o. female with a history of SVT. She was seen by Dr. Ladona Ridgel and options were reviewed. She was having episodes of SVT, increasing in frequency, despite BBs and CCBs. The best option was felt to be ablation and she came to the hospital for the procedure on 10/20/2013.   She had EPS with RFCA of AVNRT, no immediate complications. She tolerated the procedure well. Once she completed her bedrest, and ambulated, she will be discharged home, to follow up as an outpatient.   Labs:  Lab Results  Component Value Date   WBC 12.0*  10/15/2013   HGB 14.8     HCT 44.2     MCV 92.9     PLT 220         Component Value Date/Time   Na 141  10/15/2013   K+ 4.1     CL 106     CO2 27       BUN 12      Cr 0.60     GLU 137    FOLLOW UP PLANS AND APPOINTMENTS Allergies  Allergen Reactions  . Aspirin Hives and Itching  . Tramadol Hcl Other (See Comments)    Lowers BP  . Willow Bark [White Willow Bark] Hives, Itching and Swelling    Requires EPI PEN. Swelling of throat, tongue.   Baruch Merl Leaf Swallow Wort Rhizome Hives, Itching and Swelling    Requires EPI PEN, Welling of throat, tongue.  . Benadryl [Diphenhydramine Hcl] Itching    Makes patient fell hyper.  . Codeine     Patient also states she is allergic to steroids.  Marland Kitchen Cymbalta [Duloxetine Hcl] Other (See Comments)    Agitation, poor sleep  . Prednisone     All steroids  . Promethazine Hcl Anxiety     Medication List    STOP taking these medications       metoprolol succinate 50  MG 24 hr tablet  Commonly known as:  TOPROL-XL      TAKE these medications       atorvastatin 20 MG tablet  Commonly known as:  LIPITOR  Take 20 mg by mouth daily.     CENTRUM SILVER ULTRA WOMENS PO  Take 1 tablet by mouth daily.     clopidogrel 75 MG tablet  Commonly known as:  PLAVIX  Take 0.5 tablets (37.5 mg total) by mouth every morning. RESTART THIS MEDICATION ON MARCH 23RD 2013. One half daily per patient     cyclobenzaprine 10 MG tablet  Commonly known as:  FLEXERIL  Take 10 mg by mouth 3 (three) times daily as needed for muscle spasms.     diltiazem 120 MG 24 hr capsule  Commonly known as:  CARDIZEM CD  Take 1 capsule (120 mg total) by mouth daily. For blood pressure     EPIPEN 2-PAK 0.3 mg/0.3 mL Soaj injection  Generic drug:  EPINEPHrine  USE AS DIRECTED     HYDROcodone-acetaminophen 7.5-325 MG per tablet  Commonly known as:  NORCO  Take 1 tablet by mouth every 6 (six) hours as needed for pain.  polyethylene glycol powder powder  Commonly known as:  GLYCOLAX/MIRALAX  Take 17 g by mouth daily.     tiotropium 18 MCG inhalation capsule  Commonly known as:  SPIRIVA  Place 18 mcg into inhaler and inhale daily.         Future Appointments Provider Department Dept Phone   11/23/2013 3:00 PM Marinus Maw, MD [facility] [place] [phone]   11/25/2013 10:00 AM [facility] [phone]   11/29/2013 9:30 AM Claudia Desanctis [facility] [phone]     Follow-up Information   Follow up with Lewayne Bunting, MD On 11/23/2013. (See in [facility] office [address] [place], [place]; [phone] at 3:00 pm)    Specialty:  Cardiology   Contact information:   [address] [place] [address] [phone]       BRING ALL MEDICATIONS WITH YOU TO FOLLOW UP APPOINTMENTS  Time spent with patient to include physician time: 31 min Signed: Theodore Demark, PA-C 10/24/2013, 8:01 AM Co-Sign  MD

## 2013-10-20 NOTE — CV Procedure (Signed)
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 [id number].

## 2013-10-20 NOTE — Progress Notes (Signed)
Pt noted with 15 sec runs of SVT @ 20:17; asymptomatic. Dr. Adolm Joseph informed @ 21:29 & was ok for pt to go home @ 21:30. Pt informed to continue metoprolol meds as per Dr. Adolm Joseph. Pt had walked without complications; Right groin Level"0"; RIJ Level "0". Peripheral IV removed without bleeding. Discharge papers & instructions provided & explained. Pt discharged @ 21:45.

## 2013-10-20 NOTE — Interval H&P Note (Signed)
History and Physical Interval Note: Patient seen and examined. Since prior clinic visit, no change in the history, physical exam, assessment and plan.  10/20/2013 11:54 AM  [name]  has presented today for surgery, with the diagnosis of SVT  The various methods of treatment have been discussed with the patient and family. After consideration of risks, benefits and other options for treatment, the patient has consented to  Procedure(s): SUPRAVENTRICULAR TACHYCARDIA ABLATION (N/A) as a surgical intervention .  The patient's history has been reviewed, patient examined, no change in status, stable for surgery.  I have reviewed the patient's chart and labs.  Questions were answered to the patient's satisfaction.     Leonia Reeves.D.

## 2013-10-21 NOTE — Op Note (Signed)
[name], BEAVERS NO.:  [id number]  MEDICAL RECORD NO.:  [id number]  LOCATION:  6C07C                        FACILITY:  [facility]  PHYSICIAN:  Doylene Canning. Ladona Ridgel, MD    DATE OF BIRTH:  1951-09-26  DATE OF PROCEDURE:  10/20/2013 DATE OF DISCHARGE:  10/20/2013                              OPERATIVE REPORT   PROCEDURE PERFORMED:  Electrophysiologic study and radiofrequency catheter ablation of unusual AV node reentrant tachycardia.  INDRODUCTION:  The patient is a 62 year old woman with a history of recurrent tachy palpitations and documented SVT at rates of over 200 beats per minute.  These episodes start and stop suddenly and required adenosine for termination.  She has been on multiple medications and despite this, she has had recurrent symptoms and is now referred for catheter ablation.  PROCEDURE:  After informed consent was obtained, the patient was taken to the diagnostic EP lab in a fasting state.  After usual preparation and draping, intravenous fentanyl and midazolam were given for sedation. A 6-French quadripolar catheter was inserted percutaneously in the right femoral vein and advanced to the right ventricle.  A 6-French quadripolar catheter was inserted percutaneously in the right femoral vein and advanced under fluoroscopic guidance to the His bundle region of the AV node.  Next, a 6-French hexapolar catheter was inserted percutaneously in the right jugular vein and advanced to the coronary sinus.  After measuring the basic intervals, rapid ventricular pacing was carried out from the right ventricle and stepwise decreased down to 280 milliseconds where VA Wenckebach was observed.  During rapid ventricular pacing, there were lots of ectopy and variable VA times noted.  The atrial activation was midline and decremental.  Next, programmed ventricular stimulation was carried out from the right ventricle at base drive cycle length of 161 milliseconds.  The  S1-S2 interval stepwise decreased down to 300 milliseconds.  During programmed ventricular stimulation, it  was very difficult to assess atrial activation although it was always midline but there was variable VA times noted suggesting that the patient's conduction was going through a retrograde through a slow and fast AV node.  Next, programed atrial stimulation was carried out from the atrium at a base drive cycle length of 096 milliseconds.  The S1-S2 interval stepwise decreased down to 220 milliseconds.  During programmed atrial stimulation, there were multiple VA jumps, double echo beats, but no inducible SVT.  It should be noted that the [place] time on the echo beats was quite long.  Next, rapid atrial pacing was carried out from the atrium at a base drive cycle length of 045 milliseconds and stepwise decreased down to 290 milliseconds where there was reproducibly inducible SVT.  This was a long RP tachycardia, and the cycle length was around 350 milliseconds.  PVCs replaced at the time of His bundle refractoriness which did not pre-excite the atrium. Ventricular pacing during tachycardia demonstrated V-A-V conduction sequence, although at times there was a question of AV followed by very early followed by an V-A-V.  With all of the above, a tentative diagnosis of unusual AV node reentrant tachycardia was made although we had not definitively excluded atrial tachycardia.  A 7-French quadripolar ablation  catheter was then inserted percutaneously through the right femoral vein and advanced under fluoroscopic guidance into the right atrium.  Mapping of Koch's triangle was carried out.  It was not particularly large in the anterior-posterior position but in the superior-inferior position it was somewhat enlarged.  A total of 4 RF energy applications were delivered.  The first RF energy application was applied during SVT and the tachycardia broke promptly and there was accelerated junctional  rhythm during the first as well as subsequent RF energy applications.  There was transient AV block during RF and we came off RF immediately.  AV conduction persisted.  At this point, rapid atrial pacing was again carried out from the atrium and stepwise decreased down to 280 milliseconds where AV Wenckebach was observed and there was no inducible SVT.  Programmed atrial and ventricular stimulation as well as rapid ventricular pacing were all carried out again following catheter ablation.  The patient was observed for 20 minutes and during this time, despite extensive pacing both in the atrium and ventricle, there was no inducible SVT.  The catheters were then removed.  Hemostasis was assured, and the patient was returned to her room in satisfactory condition.  COMPLICATIONS:  There no immediate procedure complications.  RESULTS:  A.  Baseline ECG.  The baseline ECG demonstrates normal sinus rhythm with normal axis and intervals.  There was no pre-excitation. B.  Baseline intervals.  Sinus node cycle length was 760 milliseconds. The HV interval was 46 milliseconds.  The QRS duration was 80 milliseconds.  The AH interval was 84 milliseconds. C.  Rapid ventricular pacing.  Rapid ventricular pacing was carried out, both before and after catheter ablation demonstrating a VA Wenckebach cycle length of 300 milliseconds.  During rapid ventricular pacing the atrial activation was midline and decremental. D.  Programmed ventricular stimulation.  Programmed ventricular stimulation was carried out from the right ventricle at base drive cycle length of 161 milliseconds.  The S1-S2 interval stepwise decreased down to 290 milliseconds where the retrograde AV node ERP was observed. During programmed ventricular stimulation, the atrial activation was midline and decremental.  Although there were VA jumps. E.  Programmed atrial stimulation.  Programmed atrial stimulation was carried out from the atrium  at a base drive cycle length of 096 milliseconds with the S1-S2 interval stepwise decreased from 440 milliseconds down to 220 milliseconds where the AV node ERP was observed.  During programmed stimulation, there are multiple AH jumps and echo beats and double echo beats, but no inducible SVT. F.  Rapid atrial pacing.  Rapid atrial pacing was carried out from the atrium at a base drive cycle length of 045 milliseconds and stepwise decreased down to 290 milliseconds resulting in the induction of SVT. Additional decrements were carried out down to 270 milliseconds.  An AV Wenckebach cycle length post ablation was 280 milliseconds. G.  Arrhythmias observed. 1. Unusual AV node reentrant tachycardia initiation was with rapid     atrial pacing.  The duration was sustained.  Termination was with     catheter ablation.  Cycle length was 350 milliseconds.     a.     Mapping.  Mapping of Koch's triangle demonstrated fairly      tall Koch's triangle, but was not particularly deep or in the      anterior-posterior position.     b.     RF energy application.  A total of 4 RF energy applications      were delivered to sites  6 in 7 in Koch's triangle resulting in      prolonged accelerated junctional rhythm.  CONCLUSIONS:  Study demonstrates successful electrophysiologic study and RF catheter ablation of unusual AV node reentrant tachycardia with a total of 4 RF energy applications rendering in the tachycardia noninducible.     Doylene Canning. Ladona Ridgel, MD     GWT/MEDQ  D:  10/20/2013  T:  10/21/2013  Job:  [id number]  cc:   Milus Mallick. Lodema Hong, M.D. Bettey Mare. Lyman Bishop, NP

## 2013-10-24 ENCOUNTER — Encounter (HOSPITAL_COMMUNITY): Payer: Self-pay | Admitting: *Deleted

## 2013-10-26 ENCOUNTER — Encounter (INDEPENDENT_AMBULATORY_CARE_PROVIDER_SITE_OTHER): Payer: Self-pay | Admitting: *Deleted

## 2013-11-23 ENCOUNTER — Ambulatory Visit (INDEPENDENT_AMBULATORY_CARE_PROVIDER_SITE_OTHER): Payer: Medicare Other | Admitting: Internal Medicine

## 2013-11-23 ENCOUNTER — Encounter: Payer: Self-pay | Admitting: Internal Medicine

## 2013-11-23 VITALS — BP 123/71 | HR 83 | Ht 62.0 in | Wt 126.0 lb

## 2013-11-23 DIAGNOSIS — I471 Supraventricular tachycardia, unspecified: Secondary | ICD-10-CM

## 2013-11-23 DIAGNOSIS — G894 Chronic pain syndrome: Secondary | ICD-10-CM | POA: Diagnosis not present

## 2013-11-23 DIAGNOSIS — R002 Palpitations: Secondary | ICD-10-CM | POA: Diagnosis not present

## 2013-11-23 DIAGNOSIS — I10 Essential (primary) hypertension: Secondary | ICD-10-CM

## 2013-11-23 DIAGNOSIS — Z79899 Other long term (current) drug therapy: Secondary | ICD-10-CM | POA: Diagnosis not present

## 2013-11-23 NOTE — Patient Instructions (Signed)
Your physician recommends that you schedule a follow-up appointment in:  We will call and let you know post Holter  Your physician has recommended that you wear a holter monitor. Holter monitors are medical devices that record the heart's electrical activity. Doctors most often use these monitors to diagnose arrhythmias. Arrhythmias are problems with the speed or rhythm of the heartbeat. The monitor is a small, portable device. You can wear one while you do your normal daily activities. This is usually used to diagnose what is causing palpitations/syncope (passing out).
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
## 2013-11-23 NOTE — Assessment & Plan Note (Signed)
It is unclear whether she is having recurrent SVT. We will have her wear a cardiac monitor. I supect she is having AVWB or PVC's. As her Koch's triangle is very small and she was no longer inducble after ablation.

## 2013-11-24 ENCOUNTER — Telehealth: Payer: Self-pay | Admitting: *Deleted

## 2013-11-24 NOTE — Telephone Encounter (Signed)
Pt called stating that there is no way they she can get a 24 hour holter on because she does not have transportation for 2 days in a row and that transportation will not do that.   Pt would not let me resolve this. Please advise

## 2013-11-24 NOTE — Telephone Encounter (Signed)
  Maybe have the patient get monitor on an Fri and return on a Monday.

## 2013-11-24 NOTE — Telephone Encounter (Signed)
Pt denied any reason that we give for transportation.

## 2013-11-25 ENCOUNTER — Telehealth: Payer: Self-pay | Admitting: Internal Medicine

## 2013-11-25 ENCOUNTER — Encounter: Payer: Self-pay | Admitting: *Deleted

## 2013-11-25 ENCOUNTER — Other Ambulatory Visit (HOSPITAL_COMMUNITY): Payer: Medicare Other

## 2013-11-25 DIAGNOSIS — Z79899 Other long term (current) drug therapy: Secondary | ICD-10-CM | POA: Diagnosis not present

## 2013-11-25 DIAGNOSIS — G894 Chronic pain syndrome: Secondary | ICD-10-CM | POA: Diagnosis not present

## 2013-11-25 NOTE — Telephone Encounter (Signed)
Called stating she saw Dr. Lovena Le recently in [place] office for heart irregularity.  Is scheduled for a 24 hr monitor on the 28th.  Calling today stating she is just very anxious and nervous.  States she doesn't feel HR beating fast but is irregular. States she just wanted to know if could have something for anxiety.  States has a lot of issues at home also. Suggested she call her PCP but states she would make her come in for OV and doesn't have anything until Feb.  Advised will forward message to Dr. Lovena Le and his nurse to offer suggestions.

## 2013-11-25 NOTE — Telephone Encounter (Signed)
Dr Lovena Le does not fill anxiety medications.  She will need to obtain from her PCP or go to a walk in clinic

## 2013-11-25 NOTE — Telephone Encounter (Signed)
Notified that per Leonia Reader that Dr. Lovena Le does not prescribe anxiety medication and would have to go to PCP or urgent care.  She understands.

## 2013-11-25 NOTE — Telephone Encounter (Signed)
New message     Saw Dr Lovena Le at the [place] office recently---talk to a nurse regarding getting her monitor---and transportation to and from.

## 2013-11-29 ENCOUNTER — Ambulatory Visit (HOSPITAL_COMMUNITY): Payer: Medicare Other | Admitting: Oncology

## 2013-11-29 ENCOUNTER — Ambulatory Visit (INDEPENDENT_AMBULATORY_CARE_PROVIDER_SITE_OTHER): Payer: Medicare Other | Admitting: Internal Medicine

## 2013-12-05 DIAGNOSIS — Z79899 Other long term (current) drug therapy: Secondary | ICD-10-CM | POA: Diagnosis not present

## 2013-12-05 DIAGNOSIS — G894 Chronic pain syndrome: Secondary | ICD-10-CM | POA: Diagnosis not present

## 2013-12-05 DIAGNOSIS — R269 Unspecified abnormalities of gait and mobility: Secondary | ICD-10-CM | POA: Diagnosis not present

## 2013-12-07 ENCOUNTER — Ambulatory Visit (HOSPITAL_COMMUNITY)
Admission: RE | Admit: 2013-12-07 | Discharge: 2013-12-07 | Disposition: A | Discharge status: Home / Self Care | Payer: Medicare Other | Source: Ambulatory Visit | Attending: Internal Medicine | Admitting: Internal Medicine

## 2013-12-07 DIAGNOSIS — R002 Palpitations: Secondary | ICD-10-CM | POA: Insufficient documentation

## 2013-12-07 NOTE — Progress Notes (Signed)
24 hour Holter Monitor in progress. 

## 2013-12-12 ENCOUNTER — Ambulatory Visit (INDEPENDENT_AMBULATORY_CARE_PROVIDER_SITE_OTHER): Payer: Medicare Other | Admitting: Family Medicine

## 2013-12-12 ENCOUNTER — Encounter: Payer: Self-pay | Admitting: Family Medicine

## 2013-12-12 VITALS — BP 106/64 | HR 86 | Resp 18 | Ht 62.0 in | Wt 123.1 lb

## 2013-12-12 DIAGNOSIS — J438 Other emphysema: Secondary | ICD-10-CM

## 2013-12-12 DIAGNOSIS — M79675 Pain in left toe(s): Secondary | ICD-10-CM

## 2013-12-12 DIAGNOSIS — R0902 Hypoxemia: Secondary | ICD-10-CM

## 2013-12-12 DIAGNOSIS — R5383 Other fatigue: Secondary | ICD-10-CM

## 2013-12-12 DIAGNOSIS — I10 Essential (primary) hypertension: Secondary | ICD-10-CM | POA: Diagnosis not present

## 2013-12-12 DIAGNOSIS — E785 Hyperlipidemia, unspecified: Secondary | ICD-10-CM | POA: Diagnosis not present

## 2013-12-12 DIAGNOSIS — Z13820 Encounter for screening for osteoporosis: Secondary | ICD-10-CM | POA: Diagnosis not present

## 2013-12-12 DIAGNOSIS — G8929 Other chronic pain: Secondary | ICD-10-CM

## 2013-12-12 DIAGNOSIS — Z9981 Dependence on supplemental oxygen: Principal | ICD-10-CM

## 2013-12-12 DIAGNOSIS — M79609 Pain in unspecified limb: Secondary | ICD-10-CM

## 2013-12-12 DIAGNOSIS — F17200 Nicotine dependence, unspecified, uncomplicated: Secondary | ICD-10-CM | POA: Diagnosis not present

## 2013-12-12 DIAGNOSIS — M129 Arthropathy, unspecified: Secondary | ICD-10-CM

## 2013-12-12 DIAGNOSIS — R5381 Other malaise: Secondary | ICD-10-CM

## 2013-12-12 DIAGNOSIS — M79674 Pain in right toe(s): Secondary | ICD-10-CM

## 2013-12-12 DIAGNOSIS — I471 Supraventricular tachycardia: Secondary | ICD-10-CM

## 2013-12-12 DIAGNOSIS — J432 Centrilobular emphysema: Secondary | ICD-10-CM

## 2013-12-12 DIAGNOSIS — M109 Gout, unspecified: Secondary | ICD-10-CM

## 2013-12-12 NOTE — Progress Notes (Signed)
Subjective:    Patient ID: [name], female    DOB: 1950-12-18, 63 y.o.   MRN: [id number]  HPI Pt in today rro re certification of need for nocturnal oxgen , when sleeping. She has documented hypoxia from previous sleep studies, most recently in November , pt states she has been told that she need s new re certification for this year. Pt has COPD and she is still smoke 1 PPd or less  Cutting back She had ablation done in Oct 20, 2013 when she had sustained V tach. Reports `periods when her stops entirely, and just had an event monitor turned in last week after 24 hr hrs , states that after beating 10 times it will stop Over the past 18 to 24 months , she has episodes of severe disabling pain mainly in 3rd toes on both feet, rated at a 10 wakens her from sleep, this occurs every single day during a flare duration is about 2 weeks using black cherry concentrate from health food store, coasts $15 per  Bottle, uses 1 bottle in a 3 month periiod, has the flares on avg every  2 to 3 month Will get uric acid level blood test     Review of Systems See HPI Denies recent fever or chills. Denies sinus pressure, nasal congestion, ear pain or sore throat. Denies chest congestion or  productive cough has chronic shortness of breath , due to longstanding and ongoing nicotine use.  Denies abdominal pain, nausea, vomiting,diarrhea or constipation.   Denies dysuria, frequency, hesitancy or incontinence.  Denies , seizures, Denies depression,uncontrolled  anxiety or insomnia. Denies skin break down or rash.        Objective:   Physical Exam  BP 106/64  Pulse 86  Resp 18  Ht 5\' 2"  (1.575 m)  Wt 123 lb 1.3 oz (55.829 kg)  BMI 22.51 kg/m2  SpO2 94% Patient alert and oriented and in no cardiopulmonary distress.  HEENT: No facial asymmetry, EOMI, no sinus tenderness,  oropharynx pink and moist.  Neck supple no adenopathy.  Chest: Clear to auscultation bilaterally.Decfreased air entry  throughout  CVS: S1, S2 no murmurs, no S3.  ABD: Soft non tender. Bowel sounds normal.  Ext: No edema  Amanda: Adequate ROM spine, shoulders, hips and knees.Toes examined, no erythema , warmth or tenderness noted in any toe, most specifically not in the affected toes currently  Skin: Intact, no ulcerations or rash noted.  Psych: Good eye contact, normal affect. Memory intact not anxious or depressed appearing.  CNS: CN 2-12 intact, power,  normal throughout.       Assessment & Plan:  Centrilobular emphysema Worsening with ongoing nicotine use, cessation counselling doen  Hypoxemia requiring supplemental oxygen Pt with established nocturnal hypoxemia, and emphysema with ongoing nicotine use. She also has SVT and recently had ablation,  Rept sleep study , to document ongoing need for supplemental oxygen while sleeping, which I have no reason to believe is chnaged  HYPERLIPIDEMIA Hyperlipidemia:Low fat diet discussed and encouraged.   Updated lab needed at/ before next visit.   NICOTINE ADDICTION unchanged , though [name] would like to quit smoking , she finds this extremely difficult , and is currently unwilling to set a quit date. Patient counseled for approximately 5 minutes regarding the health risks of ongoing nicotine use, specifically all types of cancer, heart disease, stroke and respiratory failure. The options available for help with cessation ,the behavioral changes to assist the process, and the option to  either gradully reduce usage  Or abruptly stop.is also discussed. Pt is also encouraged to set specific goals in number of cigarettes used daily, as well as to set a quit date.   ARTHRITIS, CHRONIC Pain management of chronic pain syndrome is through neurologist , [name]  ESSENTIAL HYPERTENSION Controlled, no change in medication DASH diet and commitment to daily physical activity for a minimum of 30 minutes discussed and encouraged, as a part of hypertension  management. The importance of attaining a healthy weight is also discussed.   Paroxysmal SVT (supraventricular tachycardia) Despite recent ab;lation , pt c/o irregularity in heart rate, feel "pauses" denies near syncope with the events, isbeing followed by cardiology closely for this  Chronic toe pain, bilateral Continues to c/o severe intermittent toe pain on both feet that even wakens her. Again states that expensive black cherry drink is what averts her symptoms but she is unable to afford this. Uric acid level to be checked

## 2013-12-12 NOTE — Patient Instructions (Addendum)
F/u in 4 month, call if you need me before  You are referred for overnight oximetry to justify your need for continued nocturnal oxygen   Fasting lipid, cmp, uric acid  And TSh  As soon as possible  You will be referred for bone density scan  Please continue to work on smoking cessation

## 2013-12-13 ENCOUNTER — Other Ambulatory Visit: Payer: Self-pay | Admitting: Family Medicine

## 2013-12-13 DIAGNOSIS — Z139 Encounter for screening, unspecified: Secondary | ICD-10-CM

## 2013-12-15 ENCOUNTER — Telehealth: Payer: Self-pay | Admitting: Adult Health

## 2013-12-15 NOTE — Telephone Encounter (Signed)
Noted monitor results on provider desk, advised Haematologist that the provider is out of the office, will have another provider review in absence of provider, will inform pt when resulted today

## 2013-12-15 NOTE — Telephone Encounter (Signed)
Pt returning call to advise she needs her results, YL manager advised Dr. Domenic Polite completed this and had the results scanned into the chart,

## 2013-12-15 NOTE — Telephone Encounter (Signed)
Results of holter monitor / tgs

## 2013-12-15 NOTE — Telephone Encounter (Signed)
Pt advised results from Dr. Domenic Polite, pt concerned per still noting her heart beat is stopping on and off again worse than before and once it starts beating again it beats really hard and she is then weakened and feels like she is ready to pass out, pt denies chest pain/swelling/SOB/headaches/dizzyness, pt has not been checking her blood pressures, pt notes this does resolve somewhat with coughing, this nurse contacted Dr Cristopher Peru via telephone and he advised she is having PVC's and at this point based on her holter results there is nothing that can be changed nor addressed via telephone and she will need an apt to come into the office, Dr. Cristopher Peru advised she can be seen in the [place] office if no sooner apts available to see her in the [place] office, called [place] and was informed Dr. Cristopher Peru has been scheduled in the  office for 12-21-13 however the schedule is not open at this time per KL to advise Dr. Cristopher Peru advised she can be seen then, made Y[facility] manager aware to open the schedule for the pt and pt's to be placed on this schedule for next week, schedule opened, called pt to offer the apt, pt accepted apt for 9am per unable to schedule at this time per noted the schedule is still closed and unable to place pt on at this time, message left for front desk to schedule pt once opened, pt made aware if sxs worsen to please go to the ER, pt understood.

## 2013-12-15 NOTE — Telephone Encounter (Signed)
Pt needs to know about 24 hour holter monitor results.

## 2013-12-18 DIAGNOSIS — M79675 Pain in left toe(s): Secondary | ICD-10-CM

## 2013-12-18 DIAGNOSIS — M79674 Pain in right toe(s): Secondary | ICD-10-CM

## 2013-12-18 DIAGNOSIS — G8929 Other chronic pain: Secondary | ICD-10-CM | POA: Insufficient documentation

## 2013-12-18 DIAGNOSIS — J432 Centrilobular emphysema: Secondary | ICD-10-CM | POA: Insufficient documentation

## 2013-12-18 NOTE — Assessment & Plan Note (Signed)
Continues to c/o severe intermittent toe pain on both feet that even wakens her. Again states that expensive black cherry drink is what averts her symptoms but she is unable to afford this. Uric acid level to be checked

## 2013-12-18 NOTE — Assessment & Plan Note (Signed)
Pt with established nocturnal hypoxemia, and emphysema with ongoing nicotine use. She also has SVT and recently had ablation,  Rept sleep study , to document ongoing need for supplemental oxygen while sleeping, which I have no reason to believe is chnaged

## 2013-12-18 NOTE — Assessment & Plan Note (Addendum)
Worsening with ongoing nicotine use, cessation counselling done

## 2013-12-18 NOTE — Assessment & Plan Note (Signed)
Despite recent ab;lation , pt c/o irregularity in heart rate, feel "pauses" denies near syncope with the events, isbeing followed by cardiology closely for this

## 2013-12-18 NOTE — Assessment & Plan Note (Signed)
Hyperlipidemia:Low fat diet discussed and encouraged.  Updated lab needed at/ before next visit.  

## 2013-12-18 NOTE — Assessment & Plan Note (Signed)
unchanged , though [name] would like to quit smoking , she finds this extremely difficult , and is currently unwilling to set a quit date. Patient counseled for approximately 5 minutes regarding the health risks of ongoing nicotine use, specifically all types of cancer, heart disease, stroke and respiratory failure. The options available for help with cessation ,the behavioral changes to assist the process, and the option to either gradully reduce usage  Or abruptly stop.is also discussed. Pt is also encouraged to set specific goals in number of cigarettes used daily, as well as to set a quit date.

## 2013-12-18 NOTE — Assessment & Plan Note (Signed)
Controlled, no change in medication DASH diet and commitment to daily physical activity for a minimum of 30 minutes discussed and encouraged, as a part of hypertension management. The importance of attaining a healthy weight is also discussed.  

## 2013-12-18 NOTE — Assessment & Plan Note (Signed)
Pain management of chronic pain syndrome is through neurologist , Dr Merlene Laughter

## 2013-12-20 DIAGNOSIS — R0902 Hypoxemia: Secondary | ICD-10-CM | POA: Diagnosis not present

## 2013-12-21 ENCOUNTER — Ambulatory Visit (INDEPENDENT_AMBULATORY_CARE_PROVIDER_SITE_OTHER): Payer: Medicare Other | Admitting: Internal Medicine

## 2013-12-21 ENCOUNTER — Encounter: Payer: Self-pay | Admitting: Internal Medicine

## 2013-12-21 DIAGNOSIS — R002 Palpitations: Secondary | ICD-10-CM | POA: Insufficient documentation

## 2013-12-21 DIAGNOSIS — I10 Essential (primary) hypertension: Secondary | ICD-10-CM

## 2013-12-21 NOTE — Addendum Note (Signed)
Addended by: Shara Blazing A on: 12/21/2013 03:51 PM   Modules accepted: Orders
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
## 2013-12-21 NOTE — Assessment & Plan Note (Signed)
The etiology of her symptoms is due to PAC's and rarely PVC's. She has not had recurrent SVT. We discussed the treatment options with the patient in detail. I have discussed the benign nature of the PAC's. She wishes to try watchful waiting though she would consider flecainide if her symptoms worsen. She will stop diltiazem.

## 2013-12-21 NOTE — Patient Instructions (Signed)
Your physician recommends that you schedule a follow-up appointment in: 3 months with Dr Lovena Le  Your physician has recommended you make the following change in your medication: STOP DILTIAZEM

## 2013-12-21 NOTE — Assessment & Plan Note (Signed)
Her blood pressure could go up with stopping of the diltiazem. She is scheduled to see her cardiologist in [place] in several weeks and will have her blood pressure meds adjusted at that time.

## 2013-12-26 ENCOUNTER — Ambulatory Visit (HOSPITAL_COMMUNITY)
Admission: RE | Admit: 2013-12-26 | Discharge: 2013-12-26 | Disposition: A | Discharge status: Home / Self Care | Payer: Medicare Other | Source: Ambulatory Visit | Attending: Family Medicine | Admitting: Family Medicine

## 2013-12-26 DIAGNOSIS — F17200 Nicotine dependence, unspecified, uncomplicated: Secondary | ICD-10-CM | POA: Insufficient documentation

## 2013-12-26 DIAGNOSIS — Z780 Asymptomatic menopausal state: Secondary | ICD-10-CM | POA: Diagnosis not present

## 2013-12-26 DIAGNOSIS — Z13820 Encounter for screening for osteoporosis: Secondary | ICD-10-CM

## 2013-12-27 ENCOUNTER — Encounter: Payer: Self-pay | Admitting: Family Medicine

## 2013-12-28 ENCOUNTER — Encounter: Payer: Self-pay | Admitting: Family Medicine

## 2013-12-28 DIAGNOSIS — G4734 Idiopathic sleep related nonobstructive alveolar hypoventilation: Secondary | ICD-10-CM | POA: Insufficient documentation

## 2013-12-28 NOTE — Progress Notes (Signed)
   Subjective:    Patient ID: [name], female    DOB: [birth year], 63 y.o.   MRN: [id number]  HPI    Review of Systems     Objective:   Physical Exam Patient had overnight oximetry study done on 2/10 to 12/21/2013. I personally reviewed the study. Patient's lowest oxygen level was 76% while sleeping, her average oxygen level was 89.6%, her total test time was 6 hrs 53 mind, of which she desaturated under 89% for 3 hours and 27 mins.       Assessment & Plan:  Patient qualifies and requires supplemental oxygen while sleepingg based on objective data obtained in sleep study of Feb, 2015

## 2013-12-30 ENCOUNTER — Inpatient Hospital Stay (HOSPITAL_COMMUNITY): Admission: RE | Admit: 2013-12-30 | Payer: Medicare Other | Source: Ambulatory Visit

## 2013-12-30 ENCOUNTER — Other Ambulatory Visit: Payer: Self-pay | Admitting: Family Medicine

## 2013-12-30 DIAGNOSIS — Z1231 Encounter for screening mammogram for malignant neoplasm of breast: Secondary | ICD-10-CM

## 2014-01-06 ENCOUNTER — Ambulatory Visit (HOSPITAL_COMMUNITY): Payer: Medicare Other

## 2014-01-16 ENCOUNTER — Encounter: Payer: Self-pay | Admitting: Family Medicine

## 2014-01-23 ENCOUNTER — Telehealth: Payer: Self-pay | Admitting: *Deleted

## 2014-01-23 ENCOUNTER — Ambulatory Visit (HOSPITAL_COMMUNITY)
Admission: RE | Admit: 2014-01-23 | Discharge: 2014-01-23 | Disposition: A | Discharge status: Home / Self Care | Payer: Medicare Other | Source: Ambulatory Visit | Attending: Family Medicine | Admitting: Family Medicine

## 2014-01-23 ENCOUNTER — Ambulatory Visit (INDEPENDENT_AMBULATORY_CARE_PROVIDER_SITE_OTHER): Payer: Medicare Other | Admitting: *Deleted

## 2014-01-23 VITALS — BP 123/71 | HR 73 | Ht 62.0 in | Wt 124.0 lb

## 2014-01-23 DIAGNOSIS — Z1231 Encounter for screening mammogram for malignant neoplasm of breast: Secondary | ICD-10-CM | POA: Diagnosis not present

## 2014-01-23 DIAGNOSIS — R000 Tachycardia, unspecified: Secondary | ICD-10-CM | POA: Diagnosis not present

## 2014-01-23 NOTE — Patient Instructions (Signed)
Your physician recommends that you schedule a follow-up appointment in: to be determined

## 2014-01-23 NOTE — Progress Notes (Signed)
Pt walked in wanting to speak with nurse...Marland KitchenMarland KitchenPt states that on Friday 01/20/14 she had rapid heart beat so fast and so hard that she was scared. Pt states she took 50 mg of Metoprolol that night instead of the 1/2 pill. It took over an hour to slow her heart rate down. Pt also states her symptoms have worsen since last OV visit with Dr Lovena Le, and the extra heartbeats interrupt her daily routine.    Looked at [place] note from Dr Lovena Le and states if symptoms worsen can take Flecainide.    Pt is here for nurse visit today, since walking in. Pt BP is 123/71 and pulse of 73. Pt states she feels fine today. Pt has an appointment with Dr Nehemiah Massed from the [facility] on March 26th 2015.  Please advise if any medication changes.

## 2014-01-23 NOTE — Telephone Encounter (Signed)
Pt walked in wanting to speak with nurse...Marland KitchenMarland KitchenPt states that on Friday 01/20/14 she had rapid heart beat so fast and so hard that she was scared. Pt states that 1/2 pill that she takes for this did not help so she took a whole one and it still took over a 1/2 to work.

## 2014-01-23 NOTE — Telephone Encounter (Signed)
Evaluated pt here at office. She feels fine today. Sent a note to Dr Lovena Le to review.

## 2014-01-26 ENCOUNTER — Telehealth: Payer: Self-pay | Admitting: *Deleted

## 2014-01-26 NOTE — Telephone Encounter (Signed)
Message copied by Truett Mainland on Thu Jan 26, 2014 11:55 AM ------      Message from: Evans Lance      Created: Wed Jan 25, 2014  9:44 PM       I would be glad to see her back in followup. GT      ----- Message -----         From: Truett Mainland, LPN         Sent: 06/18/9832  10:29 AM           To: Evans Lance, MD                   ------

## 2014-01-26 NOTE — Telephone Encounter (Signed)
Pt is concerned because she was told if symptoms worsen, she could take Flecainide. Please advise. This note was continued through BP check in [place]

## 2014-01-26 NOTE — Telephone Encounter (Signed)
Message copied by Truett Mainland on Thu Jan 26, 2014 11:53 AM ------      Message from: Evans Lance      Created: Wed Jan 25, 2014  9:44 PM       I would be glad to see her back in followup. GT      ----- Message -----         From: Truett Mainland, LPN         Sent: 1/61/0960  10:29 AM           To: Evans Lance, MD                   ------

## 2014-01-27 MED ORDER — FLECAINIDE ACETATE 50 MG PO TABS: 50.0000 mg | ORAL_TABLET | Freq: Two times a day (BID) | ORAL | Status: DC

## 2014-01-27 NOTE — Telephone Encounter (Signed)
Pt can take Flecainide 50 mg twice a day per Dr Lovena Le.

## 2014-01-30 DIAGNOSIS — R269 Unspecified abnormalities of gait and mobility: Secondary | ICD-10-CM | POA: Diagnosis not present

## 2014-01-30 DIAGNOSIS — G894 Chronic pain syndrome: Secondary | ICD-10-CM | POA: Diagnosis not present

## 2014-01-30 DIAGNOSIS — Z79899 Other long term (current) drug therapy: Secondary | ICD-10-CM | POA: Diagnosis not present

## 2014-02-02 DIAGNOSIS — E782 Mixed hyperlipidemia: Secondary | ICD-10-CM | POA: Diagnosis not present

## 2014-02-02 DIAGNOSIS — I4891 Unspecified atrial fibrillation: Secondary | ICD-10-CM | POA: Diagnosis not present

## 2014-02-02 DIAGNOSIS — I4949 Other premature depolarization: Secondary | ICD-10-CM | POA: Diagnosis not present

## 2014-02-02 DIAGNOSIS — I119 Hypertensive heart disease without heart failure: Secondary | ICD-10-CM | POA: Diagnosis not present

## 2014-03-20 ENCOUNTER — Ambulatory Visit: Payer: Medicare Other | Admitting: Internal Medicine

## 2014-03-20 ENCOUNTER — Encounter: Payer: Self-pay | Admitting: Internal Medicine

## 2014-04-14 ENCOUNTER — Ambulatory Visit: Payer: Medicare Other | Admitting: Family Medicine

## 2014-05-19 DIAGNOSIS — I10 Essential (primary) hypertension: Secondary | ICD-10-CM | POA: Diagnosis not present

## 2014-05-19 DIAGNOSIS — I499 Cardiac arrhythmia, unspecified: Secondary | ICD-10-CM | POA: Diagnosis not present

## 2014-05-19 DIAGNOSIS — M545 Low back pain, unspecified: Secondary | ICD-10-CM | POA: Diagnosis not present

## 2014-05-19 DIAGNOSIS — F17200 Nicotine dependence, unspecified, uncomplicated: Secondary | ICD-10-CM | POA: Diagnosis not present

## 2014-05-19 DIAGNOSIS — G4730 Sleep apnea, unspecified: Secondary | ICD-10-CM | POA: Diagnosis not present

## 2014-05-30 ENCOUNTER — Encounter: Payer: Self-pay | Admitting: *Deleted

## 2014-08-02 ENCOUNTER — Encounter: Payer: Self-pay | Admitting: *Deleted

## 2014-08-03 ENCOUNTER — Telehealth: Payer: Self-pay | Admitting: General Practice

## 2014-08-03 NOTE — Telephone Encounter (Signed)
Noted  

## 2014-08-25 ENCOUNTER — Other Ambulatory Visit: Payer: Self-pay

## 2014-10-19 ENCOUNTER — Encounter (HOSPITAL_COMMUNITY): Payer: Self-pay | Admitting: Internal Medicine

## 2014-11-07 ENCOUNTER — Encounter: Payer: Self-pay | Admitting: *Deleted

## 2015-04-10 LAB — PULMONARY FUNCTION TEST

## 2015-05-07 ENCOUNTER — Other Ambulatory Visit: Payer: Self-pay

## 2015-05-16 ENCOUNTER — Telehealth: Payer: Self-pay | Admitting: Family Medicine

## 2015-05-16 NOTE — Telephone Encounter (Signed)
Since less than 2 years and she had moved out of state, I feel obligated to have her back, BUT she was living in [place] and that had been an issue in the past as to why she should have a PCP in another state, not sure if this will be approved through medicare , she will need to check on that  PLS if you have questions , ask me before speaking with her , thanks

## 2015-05-17 NOTE — Telephone Encounter (Signed)
Spoke with patient and she will be moving back August 1 - 3 to [facility] and she should not have no problem with insurance and her Dr now wants to make sure that she has surgery on her right upper lobe removed and asked her about doing her insurance and she can not do this until she moves back up here and if any more please call back patient stated

## 2015-06-27 ENCOUNTER — Encounter: Payer: Self-pay | Admitting: Family Medicine

## 2015-06-27 ENCOUNTER — Ambulatory Visit (INDEPENDENT_AMBULATORY_CARE_PROVIDER_SITE_OTHER): Payer: Medicare Other | Admitting: Family Medicine

## 2015-06-27 VITALS — BP 122/60 | HR 78 | Resp 18 | Ht 62.0 in | Wt 139.1 lb

## 2015-06-27 DIAGNOSIS — Z720 Tobacco use: Secondary | ICD-10-CM

## 2015-06-27 DIAGNOSIS — R7302 Impaired glucose tolerance (oral): Secondary | ICD-10-CM

## 2015-06-27 DIAGNOSIS — I10 Essential (primary) hypertension: Secondary | ICD-10-CM | POA: Diagnosis not present

## 2015-06-27 DIAGNOSIS — E785 Hyperlipidemia, unspecified: Secondary | ICD-10-CM

## 2015-06-27 DIAGNOSIS — R5382 Chronic fatigue, unspecified: Secondary | ICD-10-CM

## 2015-06-27 DIAGNOSIS — G894 Chronic pain syndrome: Secondary | ICD-10-CM

## 2015-06-27 DIAGNOSIS — E559 Vitamin D deficiency, unspecified: Secondary | ICD-10-CM | POA: Diagnosis not present

## 2015-06-27 DIAGNOSIS — Z1321 Encounter for screening for nutritional disorder: Secondary | ICD-10-CM

## 2015-06-27 DIAGNOSIS — F17200 Nicotine dependence, unspecified, uncomplicated: Secondary | ICD-10-CM

## 2015-06-27 DIAGNOSIS — C3491 Malignant neoplasm of unspecified part of right bronchus or lung: Secondary | ICD-10-CM

## 2015-06-27 DIAGNOSIS — C3490 Malignant neoplasm of unspecified part of unspecified bronchus or lung: Secondary | ICD-10-CM | POA: Insufficient documentation

## 2015-06-27 DIAGNOSIS — Z23 Encounter for immunization: Secondary | ICD-10-CM

## 2015-06-27 DIAGNOSIS — F17210 Nicotine dependence, cigarettes, uncomplicated: Secondary | ICD-10-CM | POA: Diagnosis not present

## 2015-06-27 DIAGNOSIS — I471 Supraventricular tachycardia: Secondary | ICD-10-CM

## 2015-06-27 DIAGNOSIS — G4700 Insomnia, unspecified: Secondary | ICD-10-CM

## 2015-06-27 DIAGNOSIS — R7301 Impaired fasting glucose: Secondary | ICD-10-CM

## 2015-06-27 DIAGNOSIS — F418 Other specified anxiety disorders: Secondary | ICD-10-CM

## 2015-06-27 DIAGNOSIS — G4734 Idiopathic sleep related nonobstructive alveolar hypoventilation: Secondary | ICD-10-CM

## 2015-06-27 LAB — CBC WITH DIFFERENTIAL/PLATELET
Basophils Absolute: 0 10*3/uL (ref 0.0–0.1)
Basophils Relative: 0 % (ref 0–1)
Eosinophils Absolute: 0.3 10*3/uL (ref 0.0–0.7)
Eosinophils Relative: 3 % (ref 0–5)
HEMATOCRIT: 41.8 % (ref 36.0–46.0)
HEMOGLOBIN: 14.5 g/dL (ref 12.0–15.0)
Lymphocytes Relative: 20 % (ref 12–46)
Lymphs Abs: 2.2 10*3/uL (ref 0.7–4.0)
MCH: 31.1 pg (ref 26.0–34.0)
MCHC: 34.7 g/dL (ref 30.0–36.0)
MCV: 89.7 fL (ref 78.0–100.0)
MONO ABS: 0.9 10*3/uL (ref 0.1–1.0)
MONOS PCT: 8 % (ref 3–12)
MPV: 9.9 fL (ref 8.6–12.4)
NEUTROS ABS: 7.7 10*3/uL (ref 1.7–7.7)
Neutrophils Relative %: 69 % (ref 43–77)
Platelets: 292 10*3/uL (ref 150–400)
RBC: 4.66 MIL/uL (ref 3.87–5.11)
RDW: 14.2 % (ref 11.5–15.5)
WBC: 11.1 10*3/uL — ABNORMAL HIGH (ref 4.0–10.5)

## 2015-06-27 LAB — TSH: TSH: 1.803 u[IU]/mL (ref 0.350–4.500)

## 2015-06-27 LAB — LIPID PANEL
CHOL/HDL RATIO: 3.6 ratio (ref <=5.0)
Cholesterol: 203 mg/dL — ABNORMAL HIGH (ref 125–200)
HDL: 57 mg/dL (ref >=46)
LDL Cholesterol: 121 mg/dL (ref <130)
TRIGLYCERIDES: 124 mg/dL (ref <150)
VLDL: 25 mg/dL (ref <30)

## 2015-06-27 LAB — COMPLETE METABOLIC PANEL WITH GFR
ALBUMIN: 4.4 g/dL (ref 3.6–5.1)
ALK PHOS: 79 U/L (ref 33–130)
ALT: 21 U/L (ref 6–29)
AST: 22 U/L (ref 10–35)
BUN: 15 mg/dL (ref 7–25)
CALCIUM: 9.8 mg/dL (ref 8.6–10.4)
CO2: 29 mmol/L (ref 20–31)
Chloride: 100 mmol/L (ref 98–110)
Creat: 0.65 mg/dL (ref 0.50–0.99)
Glucose, Bld: 111 mg/dL — ABNORMAL HIGH (ref 65–99)
POTASSIUM: 4.8 mmol/L (ref 3.5–5.3)
Sodium: 137 mmol/L (ref 135–146)
Total Bilirubin: 0.5 mg/dL (ref 0.2–1.2)
Total Protein: 6.8 g/dL (ref 6.1–8.1)

## 2015-06-27 LAB — HEMOGLOBIN A1C
Hgb A1c MFr Bld: 6.4 % — ABNORMAL HIGH (ref <5.7)
Mean Plasma Glucose: 137 mg/dL — ABNORMAL HIGH (ref <117)

## 2015-06-27 NOTE — Patient Instructions (Addendum)
F/u in 4 weeks call if you need me before  You are referred to oncologist  You are referred to cardiology in [facility] who you have seen before  Prevnar today  Labs today  Pain contract today and you are referred back to Dr Merlene Laughter   New to help with stopping smoking now at 8 per day is wellbutrin one daily for 3 days then one twice daily as prescribed, QUIT DATE is 07/11/2015  For anxiety, depression and poor sleep is paxil and trazodone

## 2015-06-28 ENCOUNTER — Telehealth: Payer: Self-pay | Admitting: Internal Medicine

## 2015-06-28 LAB — VITAMIN D 25 HYDROXY (VIT D DEFICIENCY, FRACTURES): Vit D, 25-Hydroxy: 32 ng/mL (ref 30–100)

## 2015-06-28 NOTE — Telephone Encounter (Signed)
New patient appt-s/w patient and gave np appt for 08/22 @ 1:45 w/Dr. Julien Nordmann Referring Dr. [name] Dx-Malignant Neoplasm of lung  Left message @ [facility] to referral coord to obtain NPI #

## 2015-06-29 ENCOUNTER — Encounter: Payer: Self-pay | Admitting: Family Medicine

## 2015-07-01 ENCOUNTER — Telehealth: Payer: Self-pay | Admitting: Family Medicine

## 2015-07-01 DIAGNOSIS — F418 Other specified anxiety disorders: Secondary | ICD-10-CM | POA: Insufficient documentation

## 2015-07-01 DIAGNOSIS — Z23 Encounter for immunization: Secondary | ICD-10-CM | POA: Insufficient documentation

## 2015-07-01 NOTE — Telephone Encounter (Signed)
Pls call her on Tuesday, has oncology appt on Monday  I recommend overnight pulse ox to re establish dx of nocturnal hypoxia, if she agreees please do necessary paper work I recommend mental health ref[facility], psychiatry and psychology to help with anxiety and depression and insomnia, ot as much of an urgency , and I know she is now focusing on the treatment fore her cancer which she needs so this can be put on hold, just trying to see if interested and if she is may refer for appt in October  Lt her know since thyroid function normal no obvious cause of her hair loss which was a concern, anxiety and stres may be the cause, I am willing to refer to dermatology but suggest holding on that also at this time Referral I suggest pursuing now apart from cardiology and pain management which were discussed at visit , is the one for nocturnal home oxygen for   ??? pls ask!  Pls review labs with her if she has questions, I relweased them to her so pls verify that she has seen them, if not pls review with her

## 2015-07-01 NOTE — Assessment & Plan Note (Signed)
Controlled, no change in medication DASH diet and commitment to daily physical activity for a minimum of 30 minutes discussed and encouraged, as a part of hypertension management. The importance of attaining a healthy weight is also discussed.  BP/Weight 06/27/2015 01/23/2014 12/12/2013 11/23/2013 10/20/2013 10/10/2013 0/05/1218  Systolic BP 758 832 549 [phone]  Diastolic BP 60 71 64 71 69 78 64  Wt. (Lbs) 139.08 124 123.08 126 125 125.8 129  BMI 25.43 22.67 22.51 23.04 22.86 23 23.59

## 2015-07-01 NOTE — Assessment & Plan Note (Signed)
Will need to be re tested to qualify , referral to be done for in home overnight pulse oximetry

## 2015-07-01 NOTE — Assessment & Plan Note (Signed)
Poor sleep due to anxiety and depression Sleep hygiene reviewed and written information offered also. Prescription sent for  medication needed.

## 2015-07-01 NOTE — Assessment & Plan Note (Signed)
Patient educated about the importance of limiting  Carbohydrate intake , the need to commit to daily physical activity for a minimum of 30 minutes , and to commit weight loss. The fact that changes in all these areas will reduce or eliminate all together the development of diabetes is stressed.   Diabetic Labs Latest Ref Rng 06/27/2015 09/29/2012 04/09/2012 01/29/2012 01/28/2012  HbA1c <5.7 % 6.4(H) - - - -  Chol 125 - 200 mg/dL 203(H) 189 163 - -  HDL >=46 mg/dL 57 53 53 - -  Calc LDL <130 mg/dL 121 109(H) 93 - -  Triglycerides <150 mg/dL 124 134 87 - -  Creatinine 0.50 - 0.99 mg/dL 0.65 0.61 0.82 0.58 0.59   BP/Weight 06/27/2015 01/23/2014 12/12/2013 11/23/2013 10/20/2013 10/10/2013 1/61/0960  Systolic BP 454 098 119 147 829 562 130  Diastolic BP 60 71 64 71 69 78 64  Wt. (Lbs) 139.08 124 123.08 126 125 125.8 129  BMI 25.43 22.67 22.51 23.04 22.86 23 23.59   No flowsheet data found.

## 2015-07-01 NOTE — Assessment & Plan Note (Signed)
After obtaining informed consent, the vaccine is  administered by LPN.  

## 2015-07-01 NOTE — Assessment & Plan Note (Signed)
Short  Term pain contreact entered into with pt, she is being referred back to Dr Merlene Laughter who had been managing her pain prior to her re location Pain medication prescribed at current dose  Which is less than her previous one

## 2015-07-01 NOTE — Assessment & Plan Note (Signed)
Medication started, pt will benefit from therapy , will offer but her more immediate need is to start cancer management, and she is challenged with transport issues, will have nurse call her to offer the service however

## 2015-07-01 NOTE — Assessment & Plan Note (Addendum)
Rate currently controlled , s/p ablation, needs to re establish with cardiology in [place], will refer

## 2015-07-01 NOTE — Assessment & Plan Note (Signed)
Hyperlipidemia:Low fat diet discussed and encouraged.   Lipid Panel  Lab Results  Component Value Date   CHOL 203* 06/27/2015   HDL 57 06/27/2015   LDLCALC 121 06/27/2015   TRIG 124 06/27/2015   CHOLHDL 3.6 06/27/2015   Needs to lower fat intake

## 2015-07-01 NOTE — Progress Notes (Signed)
Subjective:    Patient ID: [name], female    DOB: 05-Mar-1951, 64 y.o.   MRN: [id number]  HPI [name] is  Back having recently relocated from [place], where she has been living for over 1 year States that she had no access to health care due to lack of insurance.  Diagnosed with biopsy proven lung cancer, advised that she would be referred to oncology "immediately" and that she needed this taken care of "within 6 weeks" Because of lack of access to health care she  Has returned to [facility] and is anxious to get the treatment that she needs She also voices concerns re the possbiility of spread of th cancer following biopsy and states that at times she " coughs up blood" She is understandably anxious and fearful, wants soonest appt available for cancer treatment and has marked limitation as far as transportation availability is concerned Currently still smoking but desperately wants to quit due to her current health, willing to get any and all help available for this purpose C/o anxiety, depression and insomnia, not suicidal or homicidal, but with this new cancer diagnosis , she is very stressed , requests medication, had been on low dose klonopin in [place] script for chronic pain management, had been treated by pain specialist Dr Merlene Laughter when living in [facility], currently on lower dose of medication than she had previously been controlled with , will refer her back to Dr Merlene Laughter, who will hopefully accept her, in the interim , i have entered into a pain contract with her Has h/o SVT requiring ablation , cardiologist is with [facility] primary cardiologist in [place], needs to re establish, currently concerned about uncontrolled bP but t visit , her BP is good and heart rate regular    Review of Systems See HPI Denies recent fever or chills. Denies sinus pressure, nasal congestion, ear pain or sore throat.  Denies chest pains, palpitations and leg swelling Denies abdominal pain,  nausea, vomiting,diarrhea or constipation.   Denies dysuria, frequency, hesitancy or incontinence. Chronic pain syndrome affecting feet Denies headaches, seizures, numbness, or tingling.  C/o excessive thinning of hair and hair loss, concerned about this , will check thyroid function        Objective:   Physical Exam  BP 122/60 mmHg  Pulse 78  Resp 18  Ht '5\' 2"'$  (1.575 m)  Wt 139 lb 1.3 oz (63.086 kg)  BMI 25.43 kg/m2  SpO2 96% Patient alert and oriented and in no cardiopulmonary distress.  HEENT: No facial asymmetry, EOMI,   oropharynx pink and moist.  Neck supple no JVD, no mass.  Chest: adequate though reduced air entry,no crackles or wheezes heard  CVS: S1, S2 no murmurs, no S3.Regular rate.  ABD: Soft non tender.   Ext: No edema  MS: Adequate ROM spine, shoulders, hips and knees.  Skin: Intact, no ulcerations or rash noted.  Psych: Good eye contact, normal affect. Memory intact moderately  anxious mildly  depressed appearing.  CNS: CN 2-12 intact, power,  normal throughout.no focal deficits noted.       Assessment & Plan:  Lung cancer Newly diagnosed lung cancer in [place], needs to establish with local team and begin treatment, urgent referral made , pt to set appt with team, transport a challenge for her  Chronic pain syndrome Short  Term pain contreact entered into with pt, she is being referred back to Dr Merlene Laughter who had been managing her pain prior to her re location Pain  medication prescribed at current dose  Which is less than her previous one  Paroxysmal supraventricular tachycardia Rate currently controlled , s/p ablation, needs to re establish with cardiology in [place], will refer  Essential hypertension Controlled, no change in medication DASH diet and commitment to daily physical activity for a minimum of 30 minutes discussed and encouraged, as a part of hypertension management. The importance of attaining a healthy weight is also  discussed.  BP/Weight 06/27/2015 01/23/2014 12/12/2013 11/23/2013 10/20/2013 10/10/2013 05/04/6388  Systolic BP 373 428 768 115 726 203 559  Diastolic BP 60 71 64 71 69 78 64  Wt. (Lbs) 139.08 124 123.08 126 125 125.8 129  BMI 25.43 22.67 22.51 23.04 22.86 23 23.59        NICOTINE ADDICTION Patient counseled for approximately 5 minutes regarding the health risks of ongoing nicotine use, specifically all types of cancer, heart disease, stroke and respiratory failure. The options available for help with cessation ,the behavioral changes to assist the process, and the option to either gradully reduce usage  Or abruptly stop.is also discussed. Pt is also encouraged to set specific goals in number of cigarettes used daily, as well as to set a quit date.  Number of cigarettes/cigars currently smoking daily: 8, will start wellbutrin and quit date set for 2 weeks  From start date  Hyperlipemia Hyperlipidemia:Low fat diet discussed and encouraged.   Lipid Panel  Lab Results  Component Value Date   CHOL 203* 06/27/2015   HDL 57 06/27/2015   LDLCALC 121 06/27/2015   TRIG 124 06/27/2015   CHOLHDL 3.6 06/27/2015   Needs to lower fat intake     Insomnia Poor sleep due to anxiety and depression Sleep hygiene reviewed and written information offered also. Prescription sent for  medication needed.   IMPAIRED FASTING GLUCOSE Patient educated about the importance of limiting  Carbohydrate intake , the need to commit to daily physical activity for a minimum of 30 minutes , and to commit weight loss. The fact that changes in all these areas will reduce or eliminate all together the development of diabetes is stressed.   Diabetic Labs Latest Ref Rng 06/27/2015 09/29/2012 04/09/2012 01/29/2012 01/28/2012  HbA1c <5.7 % 6.4(H) - - - -  Chol 125 - 200 mg/dL 203(H) 189 163 - -  HDL >=46 mg/dL 57 53 53 - -  Calc LDL <130 mg/dL 121 109(H) 93 - -  Triglycerides <150 mg/dL 124 134 87 - -  Creatinine 0.50  - 0.99 mg/dL 0.65 0.61 0.82 0.58 0.59   BP/Weight 06/27/2015 01/23/2014 12/12/2013 11/23/2013 10/20/2013 10/10/2013 7/41/6384  Systolic BP 536 468 032 122 482 500 370  Diastolic BP 60 71 64 71 69 78 64  Wt. (Lbs) 139.08 124 123.08 126 125 125.8 129  BMI 25.43 22.67 22.51 23.04 22.86 23 23.59   No flowsheet data found.     Depression with anxiety Medication started, pt will benefit from therapy , will offer but her more immediate need is to start cancer management, and she is challenged with transport issues, will have nurse call her to offer the service however  Nocturnal hypoxia Will need to be re tested to qualify , referral to be done for in home overnight pulse oximetry

## 2015-07-01 NOTE — Assessment & Plan Note (Addendum)
Patient counseled for approximately 5 minutes regarding the health risks of ongoing nicotine use, specifically all types of cancer, heart disease, stroke and respiratory failure. The options available for help with cessation ,the behavioral changes to assist the process, and the option to either gradully reduce usage  Or abruptly stop.is also discussed. Pt is also encouraged to set specific goals in number of cigarettes used daily, as well as to set a quit date.  Number of cigarettes/cigars currently smoking daily: 8, will start wellbutrin and quit date set for 2 weeks  From start date

## 2015-07-01 NOTE — Assessment & Plan Note (Signed)
Newly diagnosed lung cancer in [place], needs to establish with local team and begin treatment, urgent referral made , pt to set appt with team, transport a challenge for her

## 2015-07-02 ENCOUNTER — Ambulatory Visit (HOSPITAL_BASED_OUTPATIENT_CLINIC_OR_DEPARTMENT_OTHER): Payer: Medicare Other | Admitting: Internal Medicine

## 2015-07-02 ENCOUNTER — Ambulatory Visit: Payer: Medicare Other

## 2015-07-02 ENCOUNTER — Encounter: Payer: Self-pay | Admitting: Internal Medicine

## 2015-07-02 ENCOUNTER — Telehealth: Payer: Self-pay | Admitting: Internal Medicine

## 2015-07-02 ENCOUNTER — Encounter: Payer: Self-pay | Admitting: *Deleted

## 2015-07-02 ENCOUNTER — Other Ambulatory Visit: Payer: Medicare Other

## 2015-07-02 ENCOUNTER — Encounter: Payer: Self-pay | Admitting: Family Medicine

## 2015-07-02 VITALS — BP 136/67 | HR 59 | Temp 98.8°F | Resp 18 | Ht 62.0 in | Wt 139.3 lb

## 2015-07-02 DIAGNOSIS — J449 Chronic obstructive pulmonary disease, unspecified: Secondary | ICD-10-CM

## 2015-07-02 DIAGNOSIS — Z720 Tobacco use: Secondary | ICD-10-CM

## 2015-07-02 DIAGNOSIS — C3431 Malignant neoplasm of lower lobe, right bronchus or lung: Secondary | ICD-10-CM

## 2015-07-02 DIAGNOSIS — C3491 Malignant neoplasm of unspecified part of right bronchus or lung: Secondary | ICD-10-CM

## 2015-07-02 DIAGNOSIS — G4730 Sleep apnea, unspecified: Secondary | ICD-10-CM | POA: Insufficient documentation
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
## 2015-07-02 NOTE — Progress Notes (Signed)
Oncology Nurse Navigator Documentation  Oncology Nurse Navigator Flowsheets 07/02/2015  Navigator Encounter Type Clinic/MDC;Initial MedOnc/spoke with patient today at clinic.  She has been diagnosed with NSCLC early stage.  Dr. Julien Nordmann requested patient be set up with surgery.   I took patient's CD scans to radiology to be scanned in to PACS system.   Patient filled out medical release form.  PFT's were completed in [place] and release form has been sent and pulmonary fill fax results.    Patient Visit Type Initial  Treatment Phase Abnormal Scans  Barriers/Navigation Needs Education  Education Other  Time Spent with Patient 30

## 2015-07-02 NOTE — Telephone Encounter (Signed)
Gave and printed appt sched and avs for pt for Aug and OCT

## 2015-07-02 NOTE — Progress Notes (Signed)
Checked in new pt with 2 insurances. Pt more than likely will not need my services but she has my card for any questions/concerns.

## 2015-07-03 ENCOUNTER — Inpatient Hospital Stay
Admission: RE | Admit: 2015-07-03 | Discharge: 2015-07-03 | Disposition: A | Discharge status: Home / Self Care | Payer: Self-pay | Source: Ambulatory Visit | Attending: Internal Medicine | Admitting: Internal Medicine

## 2015-07-03 ENCOUNTER — Other Ambulatory Visit: Payer: Self-pay | Admitting: Internal Medicine

## 2015-07-03 DIAGNOSIS — C3490 Malignant neoplasm of unspecified part of unspecified bronchus or lung: Secondary | ICD-10-CM

## 2015-07-04 ENCOUNTER — Encounter: Payer: Self-pay | Admitting: Internal Medicine

## 2015-07-04 NOTE — Progress Notes (Signed)
[facility] DSS to get status of Medicaid Industry,spoke with pt's caseworker Mrs. Jimmye Norman who states pt currently has Medicaid of [place] til the end of the month. Full Medicaid for [facility] will pick up beginning Sept 1,2016.

## 2015-07-05 ENCOUNTER — Institutional Professional Consult (permissible substitution) (INDEPENDENT_AMBULATORY_CARE_PROVIDER_SITE_OTHER): Payer: Medicare Other | Admitting: Cardiothoracic Surgery

## 2015-07-05 ENCOUNTER — Ambulatory Visit: Payer: Medicare Other | Attending: Cardiothoracic Surgery | Admitting: Physical Therapy

## 2015-07-05 ENCOUNTER — Encounter: Payer: Self-pay | Admitting: *Deleted

## 2015-07-05 VITALS — BP 149/62 | HR 73 | Temp 98.4°F | Resp 19 | Wt 139.0 lb

## 2015-07-05 DIAGNOSIS — M2560 Stiffness of unspecified joint, not elsewhere classified: Secondary | ICD-10-CM | POA: Diagnosis not present

## 2015-07-05 DIAGNOSIS — Z7409 Other reduced mobility: Secondary | ICD-10-CM | POA: Insufficient documentation

## 2015-07-05 DIAGNOSIS — C3431 Malignant neoplasm of lower lobe, right bronchus or lung: Secondary | ICD-10-CM

## 2015-07-05 DIAGNOSIS — M25571 Pain in right ankle and joints of right foot: Secondary | ICD-10-CM | POA: Insufficient documentation
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
## 2015-07-05 NOTE — Progress Notes (Signed)
[place] Clinical Social Work  Clinical Social Work met with patient at [facility] appointment to offer support and assess for psychosocial needs.  [name] recently relocated from [place] to receive medical care in [place].  She has already transferred her medicaid to [facility] Medicaid.  CSW briefly discussed counseling options to address anxiety/depression as noted by her PCP.  The patient reported she does not feel she needs psychotherapy if she will only be receiving chemotherapy.  Clinical Social Work briefly discussed Clinical Social Work role and [employer] support programs/services.  Clinical Social Work encouraged patient to call with any additional questions or concerns.   Polo Riley, MSW, LCSW, OSW-C Clinical Social Worker [facility] [phone]

## 2015-07-06 ENCOUNTER — Telehealth: Payer: Self-pay | Admitting: *Deleted

## 2015-07-06 ENCOUNTER — Other Ambulatory Visit: Payer: Self-pay | Admitting: *Deleted

## 2015-07-06 DIAGNOSIS — C3431 Malignant neoplasm of lower lobe, right bronchus or lung: Secondary | ICD-10-CM

## 2015-07-06 NOTE — Telephone Encounter (Signed)
Oncology Nurse Navigator Documentation  Oncology Nurse Navigator Flowsheets 07/06/2015  Navigator Encounter Type Telephone/I called patient to follow up with her after seeing her yesterday at thoracic clinic.  We went over smoking cessation.  She is smoking 10 cig a day and is working on quitting.  We went over strategies to quit.  I also spoke with her about improving lung function.  She has some balloons that she has been working with.  I encouraged her to continue this.  I spoke to her about exercising.  She stated she is getting a tread mill and start walking.  I again encouraged this.  I explained the benefit of the above to improve her outcomes from surgery.  She agreed and stated she is "really" working on the above.  I asked that she call me if needed for assistance.    Treatment Phase Abnormal Scans  Barriers/Navigation Needs Education  Education Smoking cessation  Time Spent with Patient 30

## 2015-07-06 NOTE — Telephone Encounter (Signed)
Pt called stating she had to cancel her appt because she is having surgery 07/24/15 and she will reschedule either she will call or she wants [place] to call her after 08/10/15 to reschedule

## 2015-07-10 DIAGNOSIS — I10 Essential (primary) hypertension: Secondary | ICD-10-CM | POA: Diagnosis not present

## 2015-07-10 DIAGNOSIS — I480 Paroxysmal atrial fibrillation: Secondary | ICD-10-CM | POA: Insufficient documentation

## 2015-07-10 DIAGNOSIS — Z8679 Personal history of other diseases of the circulatory system: Secondary | ICD-10-CM | POA: Diagnosis not present

## 2015-07-10 DIAGNOSIS — E782 Mixed hyperlipidemia: Secondary | ICD-10-CM | POA: Diagnosis not present

## 2015-07-10 NOTE — Telephone Encounter (Signed)
Called and spoke with patient and she will call back when she is able to talk.  She was at [employer].

## 2015-07-11 ENCOUNTER — Encounter (HOSPITAL_COMMUNITY): Payer: Medicare Other

## 2015-07-11 ENCOUNTER — Ambulatory Visit: Payer: Medicare Other

## 2015-07-11 ENCOUNTER — Ambulatory Visit: Payer: Medicare Other | Admitting: Internal Medicine

## 2015-07-11 ENCOUNTER — Telehealth: Payer: Self-pay | Admitting: *Deleted

## 2015-07-11 ENCOUNTER — Other Ambulatory Visit: Payer: Medicare Other

## 2015-07-11 NOTE — Telephone Encounter (Signed)
Pt returned [place] call pt said she will be home after 2:30 or so

## 2015-07-11 NOTE — Telephone Encounter (Signed)
Noted.  See previous telephone message.

## 2015-07-11 NOTE — Telephone Encounter (Signed)
Called patient after 2:30 as requested.  No answer.  Unable to leave message.

## 2015-07-18 NOTE — Pre-Procedure Instructions (Signed)
[name]  07/18/2015      [address] [place], [place], [place] [address] [place] [address] Phone: [phone] Fax: [phone]    Your procedure is scheduled on Tues, Sept 13 @ 7:30 AM  Report to [facility] Admitting at 5:30 AM  Call this number if you have problems the morning of surgery:  [phone]   Remember:  Do not eat food or drink liquids after midnight.  Take these medicines the morning of surgery with A SIP OF WATER Flecanide(Tambocor),Pain Pill(if needed),Metoprolol(Lopressor),Paxil(Paroxetine),and Spiriva.              Stop taking any Vitamins or Herbal Medications. No Goody's,BC's,Aleve,Aspirin,Ibuprofen,or Fish Oil.   Do not wear jewelry, make-up or nail polish.  Do not wear lotions, powders, or perfumes.    Do not shave 48 hours prior to surgery.    Do not bring valuables to the hospital.  [facility] is not responsible for any belongings or valuables.  Contacts, dentures or bridgework may not be worn into surgery.  Leave your suitcase in the car.  After surgery it may be brought to your room.  For patients admitted to the hospital, discharge time will be determined by your treatment team.  Patients discharged the day of surgery will not be allowed to drive home.    Special instructions:  [facility] - Preparing for Surgery  Before surgery, you can play an important role.  Because skin is not sterile, your skin needs to be as free of germs as possible.  You can reduce the number of germs on you skin by washing with CHG (chlorahexidine gluconate) soap before surgery.  CHG is an antiseptic cleaner which kills germs and bonds with the skin to continue killing germs even after washing.  Please DO NOT use if you have an allergy to CHG or antibacterial soaps.  If your skin becomes reddened/irritated stop using the CHG and inform your nurse when you arrive at Short Stay.  Do not shave (including legs and  underarms) for at least 48 hours prior to the first CHG shower.  You may shave your face.  Please follow these instructions carefully:   1.  Shower with CHG Soap the night before surgery and the                                morning of Surgery.  2.  If you choose to wash your hair, wash your hair first as usual with your       normal shampoo.  3.  After you shampoo, rinse your hair and body thoroughly to remove the                      Shampoo.  4.  Use CHG as you would any other liquid soap.  You can apply chg directly       to the skin and wash gently with scrungie or a clean washcloth.  5.  Apply the CHG Soap to your body ONLY FROM THE NECK DOWN.        Do not use on open wounds or open sores.  Avoid contact with your eyes,       ears, mouth and genitals (private parts).  Wash genitals (private parts)       with your normal soap.  6.  Wash thoroughly, paying special attention to  the area where your surgery        will be performed.  7.  Thoroughly rinse your body with warm water from the neck down.  8.  DO NOT shower/wash with your normal soap after using and rinsing off       the CHG Soap.  9.  Pat yourself dry with a clean towel.            10.  Wear clean pajamas.            11.  Place clean sheets on your bed the night of your first shower and do not        sleep with pets.  Day of Surgery  Do not apply any lotions/deoderants the morning of surgery.  Please wear clean clothes to the hospital/surgery center.    Please read over the following fact sheets that you were given. Pain Booklet, Coughing and Deep Breathing, Blood Transfusion Information, MRSA Information and Surgical Site Infection Prevention

## 2015-07-19 ENCOUNTER — Encounter (HOSPITAL_COMMUNITY)
Admission: RE | Admit: 2015-07-19 | Discharge: 2015-07-19 | Disposition: A | Discharge status: Home / Self Care | Payer: Medicare Other | Source: Ambulatory Visit | Attending: Cardiothoracic Surgery | Admitting: Cardiothoracic Surgery

## 2015-07-19 ENCOUNTER — Encounter (HOSPITAL_COMMUNITY): Payer: Self-pay

## 2015-07-19 ENCOUNTER — Telehealth: Payer: Self-pay | Admitting: *Deleted

## 2015-07-19 VITALS — BP 105/67 | HR 64 | Temp 98.4°F | Resp 18 | Ht 63.0 in | Wt 141.3 lb

## 2015-07-19 DIAGNOSIS — E785 Hyperlipidemia, unspecified: Secondary | ICD-10-CM | POA: Insufficient documentation

## 2015-07-19 DIAGNOSIS — C3431 Malignant neoplasm of lower lobe, right bronchus or lung: Secondary | ICD-10-CM | POA: Insufficient documentation

## 2015-07-19 DIAGNOSIS — Z01812 Encounter for preprocedural laboratory examination: Secondary | ICD-10-CM | POA: Diagnosis not present

## 2015-07-19 DIAGNOSIS — J449 Chronic obstructive pulmonary disease, unspecified: Secondary | ICD-10-CM | POA: Insufficient documentation

## 2015-07-19 DIAGNOSIS — G4733 Obstructive sleep apnea (adult) (pediatric): Secondary | ICD-10-CM | POA: Diagnosis not present

## 2015-07-19 DIAGNOSIS — I10 Essential (primary) hypertension: Secondary | ICD-10-CM | POA: Diagnosis not present

## 2015-07-19 DIAGNOSIS — H9190 Unspecified hearing loss, unspecified ear: Secondary | ICD-10-CM | POA: Diagnosis not present

## 2015-07-19 DIAGNOSIS — Z0183 Encounter for blood typing: Secondary | ICD-10-CM | POA: Insufficient documentation

## 2015-07-19 DIAGNOSIS — M797 Fibromyalgia: Secondary | ICD-10-CM | POA: Diagnosis not present

## 2015-07-19 DIAGNOSIS — Z01818 Encounter for other preprocedural examination: Secondary | ICD-10-CM | POA: Diagnosis not present

## 2015-07-19 HISTORY — DX: Personal history of pneumonia (recurrent): Z87.01

## 2015-07-19 HISTORY — DX: Malignant (primary) neoplasm, unspecified: C80.1

## 2015-07-19 HISTORY — DX: Depression, unspecified: F32.A

## 2015-07-19 HISTORY — DX: Major depressive disorder, single episode, unspecified: F32.9

## 2015-07-19 HISTORY — DX: Cardiac arrhythmia, unspecified: I49.9

## 2015-07-19 LAB — CBC
HCT: 41.4 % (ref 36.0–46.0)
Hemoglobin: 13.7 g/dL (ref 12.0–15.0)
MCH: 31.1 pg (ref 26.0–34.0)
MCHC: 33.1 g/dL (ref 30.0–36.0)
MCV: 94.1 fL (ref 78.0–100.0)
Platelets: 260 10*3/uL (ref 150–400)
RBC: 4.4 MIL/uL (ref 3.87–5.11)
RDW: 13.4 % (ref 11.5–15.5)
WBC: 9.9 10*3/uL (ref 4.0–10.5)

## 2015-07-19 LAB — PROTIME-INR
INR: 0.99 (ref 0.00–1.49)
Prothrombin Time: 13.3 seconds (ref 11.6–15.2)

## 2015-07-19 LAB — BLOOD GAS, ARTERIAL
Acid-base deficit: 0.3 mmol/L (ref 0.0–2.0)
Bicarbonate: 23.9 mEq/L (ref 20.0–24.0)
Drawn by: 206361
FIO2: 0.21
O2 Saturation: 96.3 %
Patient temperature: 98.6
TCO2: 25.2 mmol/L (ref 0–100)
pCO2 arterial: 40.2 mmHg (ref 35.0–45.0)
pH, Arterial: 7.393 (ref 7.350–7.450)
pO2, Arterial: 85.3 mmHg (ref 80.0–100.0)

## 2015-07-19 LAB — COMPREHENSIVE METABOLIC PANEL
ALT: 22 U/L (ref 14–54)
AST: 25 U/L (ref 15–41)
Albumin: 3.7 g/dL (ref 3.5–5.0)
Alkaline Phosphatase: 58 U/L (ref 38–126)
Anion gap: 6 (ref 5–15)
BUN: 17 mg/dL (ref 6–20)
CO2: 27 mmol/L (ref 22–32)
Calcium: 9.2 mg/dL (ref 8.9–10.3)
Chloride: 104 mmol/L (ref 101–111)
Creatinine, Ser: 0.93 mg/dL (ref 0.44–1.00)
GFR calc Af Amer: >60 mL/min (ref >60)
GFR calc non Af Amer: >60 mL/min (ref >60)
Glucose, Bld: 92 mg/dL (ref 65–99)
Potassium: 4.4 mmol/L (ref 3.5–5.1)
Sodium: 137 mmol/L (ref 135–145)
Total Bilirubin: 0.5 mg/dL (ref 0.3–1.2)
Total Protein: 6.2 g/dL — ABNORMAL LOW (ref 6.5–8.1)

## 2015-07-19 LAB — SURGICAL PCR SCREEN
MRSA, PCR: NEGATIVE
Staphylococcus aureus: NEGATIVE

## 2015-07-19 LAB — URINALYSIS, ROUTINE W REFLEX MICROSCOPIC
Bilirubin Urine: NEGATIVE
Glucose, UA: NEGATIVE mg/dL
Hgb urine dipstick: NEGATIVE
Ketones, ur: NEGATIVE mg/dL
Leukocytes, UA: NEGATIVE
Nitrite: NEGATIVE
Protein, ur: NEGATIVE mg/dL
Specific Gravity, Urine: 1.02 (ref 1.005–1.030)
Urobilinogen, UA: 0.2 mg/dL (ref 0.0–1.0)
pH: 6.5 (ref 5.0–8.0)

## 2015-07-19 LAB — ABO/RH: ABO/RH(D): A POS

## 2015-07-19 LAB — APTT: aPTT: 28 seconds (ref 24–37)

## 2015-07-19 NOTE — Progress Notes (Addendum)
PCP - Dr. [name] Cardiologist - Dr. Serafina Royals   EKG- requested from Dr. Marisue Brooklyn CXR - will need DOS  Echo - requested from Dr. Marisue Brooklyn  Stress Test - Requested from Dr. Marisue Brooklyn Cardiac Cath - denies  Patient states that she is having an Echo and stress test on Friday, September 9th.    Pt. States that she has severe sleep apnea and that she can not lay flat on back and that she has to lay on her side.  Patient does not wear a CPAP at night because she "fights it."  Sleep study was recently completed at [facility] (phone- [phone]) in [place].  Results have been requested.    Patient states that she is currently not wearing oxygen at night but has in the past.   Last office visit has also been requested from cardiologist in [place].  Dr. Megan Salon Liccini.    Patient's PCP in [place] was [facility].  Phone-[phone]; fax- [phone]

## 2015-07-19 NOTE — Telephone Encounter (Signed)
Oncology Nurse Navigator Documentation  Oncology Nurse Navigator Flowsheets 07/19/2015  Navigator Encounter Type Telephone/Called patient to follow up and see if she has any questions about upcoming surgery and smoking cessation.  I left vm message with my name and phone number to call if needed.   Patient Visit Type Follow-up  Treatment Phase Abnormal Scans  Time Spent with Patient 15

## 2015-07-20 DIAGNOSIS — I480 Paroxysmal atrial fibrillation: Secondary | ICD-10-CM | POA: Diagnosis not present

## 2015-07-20 NOTE — Telephone Encounter (Signed)
Letter sent for patient to call office.

## 2015-07-20 NOTE — Telephone Encounter (Signed)
Called patient.  Unable to leave message.  Voicemail not set up

## 2015-07-23 ENCOUNTER — Encounter (HOSPITAL_COMMUNITY): Payer: Self-pay

## 2015-07-23 NOTE — Progress Notes (Addendum)
Anesthesia Chart Review: Patient is a 64 year old female scheduled for right VATS, lobectomy on 07/24/15 by Dr. Prescott Gum. (Update: Case moved to 07/25/15 due to need to prioritize another case.)  According to [facility] (Dr. Julien Nordmann) notes, "The patient was a resident of [place] but few months ago she moved to [place]. She was in the process of establishing care with primary care physician as well as a pulmonologist for evaluation and follow-up of her COPD. Her pulmonologist order CT scan of the chest on 04/02/2015 as a baseline workup. It showed a 1.1 x 0.9 cm nodule in the right lower lobe.This was followed by a PET scan performed on 04/24/2015 and it showed hypermetabolic right lower lobe pulmonary nodule measuring 1.1 x 0.9 cm concerning for malignancy. No other areas of abnormal FDG uptake was seen in the neck, chest, abdomen or pelvis. On 05/03/2015 the patient underwent CT-guided biopsy of the right lower lobe pulmonary nodule. 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 She decided to come back to [place] for evaluation and management of her recently diagnosed lung cancer."  History includes RLL lung cancer, smoking, COPD, HLD, hearing loss, HTN, panic disorder, depression, GI bleed '13, SVT s/p ablation 10/20/13 (Dr. Lovena Le), grade 1 diastolic dysfunction (echo '13), aortic mural thrombus (01/27/12 CT: "Focal intramural thrombus along the distal descending thoracic aorta, without significant intraluminal narrowing.") '13, fibromyalgia, hepatitis C '93 s/p treatment, OSA (no CPCP; occasional nocturnal O2). For anesthesia history, she reported "lung quit working during [place] in [place]" and couldn't breathe post-op while lying flat.  PCP is Dr. [name].  Cardiologist is Dr. Nehemiah Massed (with [facility] (records are not yet up in Care Everywhere). Patient reports a stress and echo from 9/9/916.   EKG, stress, echo are pending from [facility]. I called and faxed a request.   08/08/14 Carotid duplex (Dr. Megan Salon Liccini, [place], [place]): < 50% BICA stenoses. Antegrade vertebral flow.   05/16/15 Spirometry ([place]; scanned under Media tab): FEV1 of 2.34 (103%), FVC 3.58 (120%), FEF 25-75% 1.04 (51%).   Preoperative labs noted.   She is for CXR on arrival.   Chart will be left for follow-up cardiac testing results.  George Hugh [facility] Short Stay Center/Anesthesiology Phone [phone] 07/23/2015 10:28 AM  Addendum: Records received from [facility]. Formal 07/20/15 stress test report is not yet available, but Dr. Nehemiah Massed did sign a note of surgical clearance at "low" risk.  Carleene Cooper to fax if report is ready before tomorrow.)    07/10/15 EKG: SB at 59 bpm with sinus arrhythmia.  07/20/15 Echo (Care Everywhere): INTERPRETATION NORMAL LEFT VENTRICULAR SYSTOLIC FUNCTION WITH MILD LVH. Closest EF > 55%. MILD MR/TR NO VALVULAR STENOSIS MILD PHTN  George Hugh [facility] Short Stay Center/Anesthesiology Phone [phone] 07/24/2015 9:39 AM

## 2015-07-24 NOTE — Progress Notes (Signed)
Called pt to verify that she knew arrival time for surgery.Per pt was told by office to arrive at 10:00 AM

## 2015-07-25 ENCOUNTER — Ambulatory Visit: Payer: Medicare Other | Admitting: Family Medicine

## 2015-07-25 ENCOUNTER — Inpatient Hospital Stay (HOSPITAL_COMMUNITY): Payer: Medicare Other

## 2015-07-25 ENCOUNTER — Inpatient Hospital Stay (HOSPITAL_COMMUNITY)
Admission: RE | Admit: 2015-07-25 | Discharge: 2015-07-29 | DRG: 164 | Disposition: A | Discharge status: Home / Self Care | Payer: Medicare Other | Source: Ambulatory Visit | Attending: Cardiothoracic Surgery | Admitting: Cardiothoracic Surgery

## 2015-07-25 ENCOUNTER — Inpatient Hospital Stay (HOSPITAL_COMMUNITY): Payer: Medicare Other | Admitting: Anesthesiology

## 2015-07-25 ENCOUNTER — Inpatient Hospital Stay (HOSPITAL_COMMUNITY): Payer: Medicare Other | Admitting: Emergency Medicine

## 2015-07-25 ENCOUNTER — Encounter (HOSPITAL_COMMUNITY): Payer: Self-pay | Admitting: General Practice

## 2015-07-25 ENCOUNTER — Encounter: Payer: Self-pay | Admitting: Anatomic Pathology & Clinical Pathology

## 2015-07-25 ENCOUNTER — Encounter (HOSPITAL_COMMUNITY)
Admission: RE | Disposition: A | Discharge status: Home / Self Care | Payer: Self-pay | Source: Ambulatory Visit | Attending: Cardiothoracic Surgery

## 2015-07-25 DIAGNOSIS — R911 Solitary pulmonary nodule: Secondary | ICD-10-CM | POA: Diagnosis not present

## 2015-07-25 DIAGNOSIS — F329 Major depressive disorder, single episode, unspecified: Secondary | ICD-10-CM | POA: Diagnosis present

## 2015-07-25 DIAGNOSIS — Z902 Acquired absence of lung [part of]: Secondary | ICD-10-CM

## 2015-07-25 DIAGNOSIS — Z4682 Encounter for fitting and adjustment of non-vascular catheter: Secondary | ICD-10-CM | POA: Diagnosis not present

## 2015-07-25 DIAGNOSIS — M1990 Unspecified osteoarthritis, unspecified site: Secondary | ICD-10-CM | POA: Diagnosis present

## 2015-07-25 DIAGNOSIS — C3410 Malignant neoplasm of upper lobe, unspecified bronchus or lung: Secondary | ICD-10-CM | POA: Diagnosis not present

## 2015-07-25 DIAGNOSIS — F17210 Nicotine dependence, cigarettes, uncomplicated: Secondary | ICD-10-CM | POA: Diagnosis present

## 2015-07-25 DIAGNOSIS — H9190 Unspecified hearing loss, unspecified ear: Secondary | ICD-10-CM | POA: Diagnosis present

## 2015-07-25 DIAGNOSIS — J449 Chronic obstructive pulmonary disease, unspecified: Secondary | ICD-10-CM | POA: Diagnosis not present

## 2015-07-25 DIAGNOSIS — I10 Essential (primary) hypertension: Secondary | ICD-10-CM | POA: Diagnosis present

## 2015-07-25 DIAGNOSIS — M797 Fibromyalgia: Secondary | ICD-10-CM | POA: Diagnosis present

## 2015-07-25 DIAGNOSIS — E871 Hypo-osmolality and hyponatremia: Secondary | ICD-10-CM | POA: Diagnosis not present

## 2015-07-25 DIAGNOSIS — M545 Low back pain: Secondary | ICD-10-CM | POA: Diagnosis not present

## 2015-07-25 DIAGNOSIS — R0602 Shortness of breath: Secondary | ICD-10-CM

## 2015-07-25 DIAGNOSIS — G4730 Sleep apnea, unspecified: Secondary | ICD-10-CM | POA: Diagnosis present

## 2015-07-25 DIAGNOSIS — J9382 Other air leak: Secondary | ICD-10-CM | POA: Diagnosis present

## 2015-07-25 DIAGNOSIS — J939 Pneumothorax, unspecified: Secondary | ICD-10-CM

## 2015-07-25 DIAGNOSIS — E785 Hyperlipidemia, unspecified: Secondary | ICD-10-CM | POA: Diagnosis present

## 2015-07-25 DIAGNOSIS — C3431 Malignant neoplasm of lower lobe, right bronchus or lung: Secondary | ICD-10-CM

## 2015-07-25 DIAGNOSIS — J60 Coalworker's pneumoconiosis: Secondary | ICD-10-CM | POA: Diagnosis not present

## 2015-07-25 DIAGNOSIS — J9811 Atelectasis: Secondary | ICD-10-CM | POA: Diagnosis not present

## 2015-07-25 DIAGNOSIS — J90 Pleural effusion, not elsewhere classified: Secondary | ICD-10-CM | POA: Diagnosis not present

## 2015-07-25 DIAGNOSIS — C3491 Malignant neoplasm of unspecified part of right bronchus or lung: Secondary | ICD-10-CM | POA: Diagnosis present

## 2015-07-25 DIAGNOSIS — Z9889 Other specified postprocedural states: Secondary | ICD-10-CM

## 2015-07-25 HISTORY — PX: LYMPH NODE DISSECTION: SHX5087

## 2015-07-25 HISTORY — PX: VIDEO ASSISTED THORACOSCOPY (VATS)/ LOBECTOMY: SHX6169

## 2015-07-25 LAB — BLOOD GAS, ARTERIAL
Acid-base deficit: 0.4 mmol/L (ref 0.0–2.0)
Bicarbonate: 26.6 mEq/L — ABNORMAL HIGH (ref 20.0–24.0)
O2 Content: 15 L/min
O2 Saturation: 97.9 %
Patient temperature: 96.6
TCO2: 28.7 mmol/L (ref 0–100)
pCO2 arterial: 65 mmHg (ref 35.0–45.0)
pH, Arterial: 7.228 — ABNORMAL LOW (ref 7.350–7.450)
pO2, Arterial: 129 mmHg — ABNORMAL HIGH (ref 80.0–100.0)

## 2015-07-25 LAB — POCT I-STAT 3, ART BLOOD GAS (G3+)
Acid-base deficit: 2 mmol/L (ref 0.0–2.0)
Acid-base deficit: 1 mmol/L (ref 0.0–2.0)
Bicarbonate: 26 mEq/L — ABNORMAL HIGH (ref 20.0–24.0)
Bicarbonate: 26.4 mEq/L — ABNORMAL HIGH (ref 20.0–24.0)
O2 Saturation: 94 %
O2 Saturation: 99 %
Patient temperature: 97.5
Patient temperature: 97.5
TCO2: 28 mmol/L (ref 0–100)
TCO2: 28 mmol/L (ref 0–100)
pCO2 arterial: 57.9 mmHg (ref 35.0–45.0)
pCO2 arterial: 51.7 mmHg — ABNORMAL HIGH (ref 35.0–45.0)
pH, Arterial: 7.257 — ABNORMAL LOW (ref 7.350–7.450)
pH, Arterial: 7.313 — ABNORMAL LOW (ref 7.350–7.450)
pO2, Arterial: 81 mmHg (ref 80.0–100.0)
pO2, Arterial: 133 mmHg — ABNORMAL HIGH (ref 80.0–100.0)

## 2015-07-25 LAB — GLUCOSE, CAPILLARY: Glucose-Capillary: 150 mg/dL — ABNORMAL HIGH (ref 65–99)

## 2015-07-25 SURGERY — VIDEO ASSISTED THORACOSCOPY (VATS)/ LOBECTOMY
Anesthesia: General | Site: Chest | Laterality: Right

## 2015-07-25 MED ORDER — PROPOFOL 10 MG/ML IV BOLUS
INTRAVENOUS | Status: AC
  Filled 2015-07-25: qty 20

## 2015-07-25 MED ORDER — TRAZODONE HCL 50 MG PO TABS
50.0000 mg | ORAL_TABLET | Freq: Every day | ORAL | Status: DC
  Administered 2015-07-28: 50 mg via ORAL
  Filled 2015-07-25 (×5): qty 1

## 2015-07-25 MED ORDER — ACETAMINOPHEN 500 MG PO TABS
1000.0000 mg | ORAL_TABLET | Freq: Four times a day (QID) | ORAL | Status: DC
  Administered 2015-07-27 – 2015-07-29 (×4): 1000 mg via ORAL
  Filled 2015-07-25 (×15): qty 2

## 2015-07-25 MED ORDER — ONDANSETRON HCL 4 MG/2ML IJ SOLN
4.0000 mg | Freq: Four times a day (QID) | INTRAMUSCULAR | Status: DC | PRN
  Filled 2015-07-25: qty 2

## 2015-07-25 MED ORDER — PROPOFOL 10 MG/ML IV BOLUS
INTRAVENOUS | Status: DC | PRN
  Administered 2015-07-25: 30 mg via INTRAVENOUS
  Administered 2015-07-25: 170 mg via INTRAVENOUS

## 2015-07-25 MED ORDER — MIDAZOLAM HCL 2 MG/2ML IJ SOLN
2.0000 mg | INTRAMUSCULAR | Status: DC | PRN
  Administered 2015-07-25: 2 mg via INTRAVENOUS

## 2015-07-25 MED ORDER — FENTANYL CITRATE (PF) 100 MCG/2ML IJ SOLN
INTRAMUSCULAR | Status: AC
  Administered 2015-07-25 (×3): 25 ug via INTRAVENOUS
  Administered 2015-07-25: 150 ug via INTRAVENOUS
  Administered 2015-07-25: 100 ug via INTRAVENOUS
  Administered 2015-07-25 (×3): 50 ug via INTRAVENOUS
  Administered 2015-07-25: 25 ug via INTRAVENOUS
  Filled 2015-07-25: qty 2

## 2015-07-25 MED ORDER — SODIUM CHLORIDE 0.9 % IJ SOLN: 9.0000 mL | INTRAMUSCULAR | Status: DC | PRN

## 2015-07-25 MED ORDER — ROCURONIUM BROMIDE 100 MG/10ML IV SOLN
INTRAVENOUS | Status: DC | PRN
  Administered 2015-07-25: 10 mg via INTRAVENOUS
  Administered 2015-07-25: 50 mg via INTRAVENOUS

## 2015-07-25 MED ORDER — ARTIFICIAL TEARS OP OINT
TOPICAL_OINTMENT | OPHTHALMIC | Status: AC
  Filled 2015-07-25: qty 3.5

## 2015-07-25 MED ORDER — HYDRALAZINE HCL 20 MG/ML IJ SOLN: 10.0000 mg | INTRAMUSCULAR | Status: DC | PRN

## 2015-07-25 MED ORDER — BUPIVACAINE 0.5 % ON-Q PUMP SINGLE CATH 400 ML
INJECTION | Status: DC | PRN
  Administered 2015-07-25: 400 mL

## 2015-07-25 MED ORDER — ARTIFICIAL TEARS OP OINT
TOPICAL_OINTMENT | OPHTHALMIC | Status: DC | PRN
  Administered 2015-07-25: 1 via OPHTHALMIC

## 2015-07-25 MED ORDER — ONDANSETRON HCL 4 MG/2ML IJ SOLN
4.0000 mg | Freq: Four times a day (QID) | INTRAMUSCULAR | Status: DC | PRN
  Administered 2015-07-25 – 2015-07-26 (×2): 4 mg via INTRAVENOUS
  Filled 2015-07-25 (×2): qty 2

## 2015-07-25 MED ORDER — FLECAINIDE ACETATE 50 MG PO TABS
25.0000 mg | ORAL_TABLET | Freq: Two times a day (BID) | ORAL | Status: DC
Start: 2015-07-25 — End: 2015-07-29
  Administered 2015-07-25 – 2015-07-27 (×4): 25 mg via ORAL
  Administered 2015-07-27: 10:00:00 via ORAL
  Administered 2015-07-28 – 2015-07-29 (×3): 25 mg via ORAL
  Filled 2015-07-25 (×11): qty 1

## 2015-07-25 MED ORDER — ACETAMINOPHEN 160 MG/5ML PO SOLN: 1000.0000 mg | Freq: Four times a day (QID) | ORAL | Status: DC

## 2015-07-25 MED ORDER — HEMOSTATIC AGENTS (NO CHARGE) OPTIME
TOPICAL | Status: DC | PRN
  Administered 2015-07-25: 1 via TOPICAL

## 2015-07-25 MED ORDER — FENTANYL CITRATE (PF) 100 MCG/2ML IJ SOLN
100.0000 ug | INTRAMUSCULAR | Status: DC | PRN
  Administered 2015-07-25: 100 ug via INTRAVENOUS

## 2015-07-25 MED ORDER — DEXTROSE 5 % IV SOLN
1.5000 g | INTRAVENOUS | Status: AC
  Administered 2015-07-25: 1.5 g via INTRAVENOUS
  Filled 2015-07-25: qty 1.5

## 2015-07-25 MED ORDER — NEOSTIGMINE METHYLSULFATE 10 MG/10ML IV SOLN
INTRAVENOUS | Status: DC | PRN
  Administered 2015-07-25: 4 mg via INTRAVENOUS

## 2015-07-25 MED ORDER — MIDAZOLAM HCL 2 MG/2ML IJ SOLN
INTRAMUSCULAR | Status: AC
  Filled 2015-07-25: qty 2

## 2015-07-25 MED ORDER — ROCURONIUM BROMIDE 50 MG/5ML IV SOLN
INTRAVENOUS | Status: AC
  Filled 2015-07-25: qty 2

## 2015-07-25 MED ORDER — FENTANYL CITRATE (PF) 250 MCG/5ML IJ SOLN
INTRAMUSCULAR | Status: AC
  Filled 2015-07-25: qty 5

## 2015-07-25 MED ORDER — BISACODYL 5 MG PO TBEC
10.0000 mg | DELAYED_RELEASE_TABLET | Freq: Every day | ORAL | Status: DC
  Administered 2015-07-26 – 2015-07-29 (×3): 10 mg via ORAL
  Filled 2015-07-25 (×4): qty 2

## 2015-07-25 MED ORDER — GLYCOPYRROLATE 0.2 MG/ML IJ SOLN
INTRAMUSCULAR | Status: DC | PRN
  Administered 2015-07-25: 0.6 mg via INTRAVENOUS

## 2015-07-25 MED ORDER — MIDAZOLAM HCL 2 MG/2ML IJ SOLN
INTRAMUSCULAR | Status: AC
  Filled 2015-07-25: qty 4

## 2015-07-25 MED ORDER — ATORVASTATIN CALCIUM 20 MG PO TABS
20.0000 mg | ORAL_TABLET | Freq: Every day | ORAL | Status: DC
  Administered 2015-07-26 – 2015-07-29 (×4): 20 mg via ORAL
  Filled 2015-07-25 (×6): qty 1

## 2015-07-25 MED ORDER — 0.9 % SODIUM CHLORIDE (POUR BTL) OPTIME
TOPICAL | Status: DC | PRN
  Administered 2015-07-25 (×4): 1000 mL

## 2015-07-25 MED ORDER — FENTANYL 10 MCG/ML IV SOLN
INTRAVENOUS | Status: DC
  Administered 2015-07-25: 40 ug via INTRAVENOUS
  Administered 2015-07-25: 18:00:00 via INTRAVENOUS
  Administered 2015-07-26: 170 ug via INTRAVENOUS
  Administered 2015-07-26: 06:00:00 via INTRAVENOUS
  Administered 2015-07-26: 140 ug via INTRAVENOUS
  Filled 2015-07-25 (×2): qty 50

## 2015-07-25 MED ORDER — PAROXETINE HCL 10 MG PO TABS
10.0000 mg | ORAL_TABLET | Freq: Every day | ORAL | Status: DC
  Administered 2015-07-25 – 2015-07-29 (×5): 10 mg via ORAL
  Filled 2015-07-25 (×5): qty 1

## 2015-07-25 MED ORDER — NEOSTIGMINE METHYLSULFATE 10 MG/10ML IV SOLN
INTRAVENOUS | Status: AC
  Filled 2015-07-25: qty 1

## 2015-07-25 MED ORDER — PHENYLEPHRINE 40 MCG/ML (10ML) SYRINGE FOR IV PUSH (FOR BLOOD PRESSURE SUPPORT)
PREFILLED_SYRINGE | INTRAVENOUS | Status: AC
  Filled 2015-07-25: qty 10

## 2015-07-25 MED ORDER — EPHEDRINE SULFATE 50 MG/ML IJ SOLN
INTRAMUSCULAR | Status: AC
  Filled 2015-07-25: qty 1

## 2015-07-25 MED ORDER — BUPIVACAINE ON-Q PAIN PUMP (FOR ORDER SET NO CHG)
INJECTION | Status: AC
  Filled 2015-07-25: qty 1

## 2015-07-25 MED ORDER — POTASSIUM CHLORIDE 10 MEQ/50ML IV SOLN
10.0000 meq | Freq: Every day | INTRAVENOUS | Status: DC | PRN
  Filled 2015-07-25 (×2): qty 50

## 2015-07-25 MED ORDER — ROCURONIUM BROMIDE 50 MG/5ML IV SOLN
INTRAVENOUS | Status: AC
  Filled 2015-07-25: qty 1

## 2015-07-25 MED ORDER — LEVALBUTEROL HCL 1.25 MG/0.5ML IN NEBU
1.2500 mg | INHALATION_SOLUTION | Freq: Four times a day (QID) | RESPIRATORY_TRACT | Status: DC
  Administered 2015-07-25 – 2015-07-26 (×4): 1.25 mg via RESPIRATORY_TRACT
  Filled 2015-07-25 (×7): qty 0.5

## 2015-07-25 MED ORDER — INSULIN ASPART 100 UNIT/ML ~~LOC~~ SOLN
0.0000 [IU] | Freq: Four times a day (QID) | SUBCUTANEOUS | Status: DC
  Administered 2015-07-26 – 2015-07-27 (×4): 2 [IU] via SUBCUTANEOUS

## 2015-07-25 MED ORDER — LACTATED RINGERS IV SOLN
INTRAVENOUS | Status: DC
  Administered 2015-07-25: 13:00:00 via INTRAVENOUS

## 2015-07-25 MED ORDER — DEXTROSE 5 % IV SOLN
1.5000 g | Freq: Two times a day (BID) | INTRAVENOUS | Status: AC
  Administered 2015-07-25 – 2015-07-26 (×2): 1.5 g via INTRAVENOUS
  Filled 2015-07-25 (×2): qty 1.5

## 2015-07-25 MED ORDER — LIDOCAINE HCL (CARDIAC) 20 MG/ML IV SOLN
INTRAVENOUS | Status: DC | PRN
  Administered 2015-07-25: 60 mg via INTRAVENOUS

## 2015-07-25 MED ORDER — PHENYLEPHRINE HCL 10 MG/ML IJ SOLN
10.0000 mg | INTRAVENOUS | Status: DC | PRN
  Administered 2015-07-25: 20 ug/min via INTRAVENOUS

## 2015-07-25 MED ORDER — TIOTROPIUM BROMIDE MONOHYDRATE 18 MCG IN CAPS
18.0000 ug | ORAL_CAPSULE | Freq: Every day | RESPIRATORY_TRACT | Status: DC
  Administered 2015-07-26 – 2015-07-29 (×4): 18 ug via RESPIRATORY_TRACT
  Filled 2015-07-25: qty 5

## 2015-07-25 MED ORDER — LIDOCAINE HCL (CARDIAC) 20 MG/ML IV SOLN
INTRAVENOUS | Status: AC
  Filled 2015-07-25: qty 10

## 2015-07-25 MED ORDER — BUPIVACAINE 0.5 % ON-Q PUMP SINGLE CATH 400 ML
400.0000 mL | INJECTION | Status: DC
  Filled 2015-07-25: qty 400

## 2015-07-25 MED ORDER — ACETAMINOPHEN 10 MG/ML IV SOLN
INTRAVENOUS | Status: AC
  Filled 2015-07-25: qty 100

## 2015-07-25 MED ORDER — BUPIVACAINE HCL (PF) 0.5 % IJ SOLN
INTRAMUSCULAR | Status: AC
  Filled 2015-07-25: qty 10

## 2015-07-25 MED ORDER — NALOXONE HCL 0.4 MG/ML IJ SOLN
0.4000 mg | INTRAMUSCULAR | Status: DC | PRN
Start: 2015-07-25 — End: 2015-07-28

## 2015-07-25 MED ORDER — DEXTROSE-NACL 5-0.9 % IV SOLN
INTRAVENOUS | Status: DC
  Administered 2015-07-25 – 2015-07-26 (×2): via INTRAVENOUS

## 2015-07-25 MED ORDER — HYDROMORPHONE HCL 1 MG/ML IJ SOLN
INTRAMUSCULAR | Status: AC
  Filled 2015-07-25: qty 1

## 2015-07-25 MED ORDER — SENNOSIDES-DOCUSATE SODIUM 8.6-50 MG PO TABS
1.0000 | ORAL_TABLET | Freq: Every day | ORAL | Status: DC
  Administered 2015-07-26 – 2015-07-28 (×2): 1 via ORAL
  Filled 2015-07-25 (×5): qty 1

## 2015-07-25 MED ORDER — BUPIVACAINE HCL (PF) 0.5 % IJ SOLN
INTRAMUSCULAR | Status: DC | PRN
  Administered 2015-07-25: 10 mL

## 2015-07-25 MED ORDER — SODIUM CHLORIDE 0.9 % IJ SOLN
INTRAMUSCULAR | Status: AC
  Filled 2015-07-25: qty 10

## 2015-07-25 MED ORDER — METOPROLOL TARTRATE 12.5 MG HALF TABLET
12.5000 mg | ORAL_TABLET | Freq: Two times a day (BID) | ORAL | Status: DC
  Administered 2015-07-26 – 2015-07-29 (×7): 12.5 mg via ORAL
  Filled 2015-07-25 (×8): qty 1

## 2015-07-25 MED ORDER — HYDROMORPHONE HCL 1 MG/ML IJ SOLN
0.2500 mg | INTRAMUSCULAR | Status: DC | PRN
  Administered 2015-07-25: 0.5 mg via INTRAVENOUS

## 2015-07-25 MED ORDER — ACETAMINOPHEN 10 MG/ML IV SOLN
INTRAVENOUS | Status: DC | PRN
  Administered 2015-07-25: 1000 mg via INTRAVENOUS

## 2015-07-25 MED ORDER — ACETAMINOPHEN 325 MG PO TABS: 650.0000 mg | ORAL_TABLET | Freq: Four times a day (QID) | ORAL | Status: DC | PRN

## 2015-07-25 MED ORDER — ONDANSETRON HCL 4 MG/2ML IJ SOLN
INTRAMUSCULAR | Status: AC
  Filled 2015-07-25: qty 2

## 2015-07-25 MED ORDER — PANTOPRAZOLE SODIUM 40 MG PO TBEC
40.0000 mg | DELAYED_RELEASE_TABLET | Freq: Every day | ORAL | Status: DC
  Administered 2015-07-26 – 2015-07-29 (×4): 40 mg via ORAL
  Filled 2015-07-25 (×4): qty 1

## 2015-07-25 SURGICAL SUPPLY — 93 items
ADH SKN CLS APL DERMABOND .7 (GAUZE/BANDAGES/DRESSINGS) ×1
APL SRG 22X2 LUM MLBL SLNT (VASCULAR PRODUCTS) ×1
APPLICATOR TIP EXT COSEAL (VASCULAR PRODUCTS) ×2 IMPLANT
BAG DECANTER FOR FLEXI CONT (MISCELLANEOUS) IMPLANT
BLADE SURG 11 STRL SS (BLADE) ×3 IMPLANT
CANISTER SUCTION 2500CC (MISCELLANEOUS) ×3 IMPLANT
CATH KIT ON Q 5IN SLV (PAIN MANAGEMENT) ×2 IMPLANT
CATH THORACIC 28FR (CATHETERS) ×2 IMPLANT
CATH THORACIC 36FR (CATHETERS) IMPLANT
CATH THORACIC 36FR RT ANG (CATHETERS) ×2 IMPLANT
CLIP TI MEDIUM 6 (CLIP) ×4 IMPLANT
CONN Y 3/8X3/8X3/8  BEN (MISCELLANEOUS)
CONN Y 3/8X3/8X3/8 BEN (MISCELLANEOUS) IMPLANT
CONT SPEC 4OZ CLIKSEAL STRL BL (MISCELLANEOUS) ×6 IMPLANT
DERMABOND ADVANCED (GAUZE/BANDAGES/DRESSINGS) ×2
DERMABOND ADVANCED .7 DNX12 (GAUZE/BANDAGES/DRESSINGS) IMPLANT
DRAPE LAPAROSCOPIC ABDOMINAL (DRAPES) ×3 IMPLANT
DRAPE WARM FLUID 44X44 (DRAPE) ×3 IMPLANT
ELECT BLADE 4.0 EZ CLEAN MEGAD (MISCELLANEOUS) ×3
ELECT BLADE 6.5 EXT (BLADE) ×2 IMPLANT
ELECT REM PT RETURN 9FT ADLT (ELECTROSURGICAL) ×3
ELECTRODE BLDE 4.0 EZ CLN MEGD (MISCELLANEOUS) IMPLANT
ELECTRODE REM PT RTRN 9FT ADLT (ELECTROSURGICAL) ×1 IMPLANT
GAUZE SPONGE 4X4 12PLY STRL (GAUZE/BANDAGES/DRESSINGS) ×3 IMPLANT
GLOVE BIO SURGEON STRL SZ 6.5 (GLOVE) ×1 IMPLANT
GLOVE BIO SURGEON STRL SZ7.5 (GLOVE) ×6 IMPLANT
GLOVE BIO SURGEONS STRL SZ 6.5 (GLOVE) ×1
GLOVE BIOGEL PI IND STRL 6.5 (GLOVE) IMPLANT
GLOVE BIOGEL PI IND STRL 8 (GLOVE) IMPLANT
GLOVE BIOGEL PI INDICATOR 6.5 (GLOVE) ×8
GLOVE BIOGEL PI INDICATOR 8 (GLOVE) ×2
GLOVE ECLIPSE 6.5 STRL STRAW (GLOVE) ×2 IMPLANT
GLOVE SKINSENSE STRL SZ6.0 (GLOVE) ×2 IMPLANT
GOWN STRL REUS W/ TWL LRG LVL3 (GOWN DISPOSABLE) ×3 IMPLANT
GOWN STRL REUS W/TWL LRG LVL3 (GOWN DISPOSABLE) ×18
HANDLE STAPLE ENDO GIA SHORT (STAPLE)
HEMOSTAT SURGICEL 2X14 (HEMOSTASIS) IMPLANT
KIT BASIN OR (CUSTOM PROCEDURE TRAY) ×3 IMPLANT
KIT ROOM TURNOVER OR (KITS) ×3 IMPLANT
KIT SUCTION CATH 14FR (SUCTIONS) ×3 IMPLANT
NS IRRIG 1000ML POUR BTL (IV SOLUTION) ×12 IMPLANT
PACK CHEST (CUSTOM PROCEDURE TRAY) ×3 IMPLANT
PAD ARMBOARD 7.5X6 YLW CONV (MISCELLANEOUS) ×9 IMPLANT
RELOAD GOLD ECHELON 45 (STAPLE) ×4 IMPLANT
RELOAD STAPLE 35X2.5 WHT THIN (STAPLE) IMPLANT
RELOAD STAPLER LINE PROX 30 GR (STAPLE) ×1 IMPLANT
SEALANT PROGEL (MISCELLANEOUS) IMPLANT
SEALANT SURG COSEAL 4ML (VASCULAR PRODUCTS) IMPLANT
SEALANT SURG COSEAL 8ML (VASCULAR PRODUCTS) ×2 IMPLANT
SOLUTION ANTI FOG 6CC (MISCELLANEOUS) ×3 IMPLANT
SPONGE GAUZE 4X4 12PLY STER LF (GAUZE/BANDAGES/DRESSINGS) ×2 IMPLANT
SPONGE INTESTINAL PEANUT (DISPOSABLE) ×2 IMPLANT
SPONGE TONSIL 1 RF SGL (DISPOSABLE) ×2 IMPLANT
SPONGE TONSIL 1.25 RF SGL STRG (GAUZE/BANDAGES/DRESSINGS) ×5 IMPLANT
STAPLE RELOAD 2.5MM WHITE (STAPLE) ×9 IMPLANT
STAPLER ECHELON POWERED (MISCELLANEOUS) ×2 IMPLANT
STAPLER ENDO GIA 12 SHRT THIN (STAPLE) ×1 IMPLANT
STAPLER ENDO GIA 12MM SHORT (STAPLE) IMPLANT
STAPLER RELOAD LINE PROX 30 GR (STAPLE) ×3
STAPLER RELOADABLE 30 GRN THCK (STAPLE) IMPLANT
STAPLER TA30 4.8 NON-ABS (STAPLE) IMPLANT
STAPLER VASCULAR ECHELON 35 (CUTTER) ×2 IMPLANT
SUT CHROMIC 3 0 SH 27 (SUTURE) IMPLANT
SUT ETHILON 3 0 PS 1 (SUTURE) IMPLANT
SUT PROLENE 3 0 SH DA (SUTURE) IMPLANT
SUT PROLENE 4 0 RB 1 (SUTURE) ×3
SUT PROLENE 4-0 RB1 .5 CRCL 36 (SUTURE) IMPLANT
SUT PROLENE 6 0 C 1 30 (SUTURE) IMPLANT
SUT SILK  1 MH (SUTURE) ×6
SUT SILK 1 MH (SUTURE) ×2 IMPLANT
SUT SILK 1 TIES 10X30 (SUTURE) IMPLANT
SUT SILK 2 0 SH (SUTURE) ×2 IMPLANT
SUT SILK 2 0SH CR/8 30 (SUTURE) IMPLANT
SUT SILK 3 0SH CR/8 30 (SUTURE) IMPLANT
SUT VIC AB 1 CTX 18 (SUTURE) ×4 IMPLANT
SUT VIC AB 2 TP1 27 (SUTURE) IMPLANT
SUT VIC AB 2-0 CTX 36 (SUTURE) ×2 IMPLANT
SUT VIC AB 3-0 SH 18 (SUTURE) IMPLANT
SUT VIC AB 3-0 X1 27 (SUTURE) ×4 IMPLANT
SUT VICRYL 0 UR6 27IN ABS (SUTURE) ×2 IMPLANT
SUT VICRYL 2 TP 1 (SUTURE) ×4 IMPLANT
SWAB COLLECTION DEVICE MRSA (MISCELLANEOUS) IMPLANT
SYRINGE 10CC LL (SYRINGE) ×2 IMPLANT
SYSTEM SAHARA CHEST DRAIN ATS (WOUND CARE) ×3 IMPLANT
TAPE CLOTH SURG 4X10 WHT LF (GAUZE/BANDAGES/DRESSINGS) ×2 IMPLANT
TIP APPLICATOR SPRAY EXTEND 16 (VASCULAR PRODUCTS) IMPLANT
TOWEL OR 17X24 6PK STRL BLUE (TOWEL DISPOSABLE) ×6 IMPLANT
TOWEL OR 17X26 10 PK STRL BLUE (TOWEL DISPOSABLE) ×6 IMPLANT
TRAP SPECIMEN MUCOUS 40CC (MISCELLANEOUS) IMPLANT
TRAY FOLEY CATH 16FRSI W/METER (SET/KITS/TRAYS/PACK) ×3 IMPLANT
TUBE ANAEROBIC SPECIMEN COL (MISCELLANEOUS) IMPLANT
TUNNELER SHEATH ON-Q 11GX8 DSP (PAIN MANAGEMENT) ×3 IMPLANT
WATER STERILE IRR 1000ML POUR (IV SOLUTION) ×6 IMPLANT

## 2015-07-25 NOTE — Anesthesia Preprocedure Evaluation (Addendum)
Anesthesia Evaluation  Patient identified by MRN, date of birth, ID band Patient awake    Reviewed: Allergy & Precautions, NPO status , Patient's Chart, lab work & pertinent test results  History of Anesthesia Complications Negative for: history of anesthetic complications  Airway Mallampati: II  TM Distance: >3 FB Neck ROM: Full    Dental  (+) Teeth Intact, Dental Advisory Given   Pulmonary sleep apnea , COPD, Current Smoker,    Pulmonary exam normal        Cardiovascular hypertension, Normal cardiovascular exam     Neuro/Psych PSYCHIATRIC DISORDERS Anxiety Depression    GI/Hepatic hiatal hernia, GERD  ,(+) Hepatitis -, C  Endo/Other  negative endocrine ROS  Renal/GU negative Renal ROS     Musculoskeletal   Abdominal   Peds  Hematology   Anesthesia Other Findings   Reproductive/Obstetrics                            Anesthesia Physical Anesthesia Plan  ASA: III  Anesthesia Plan: General   Post-op Pain Management:    Induction: Intravenous  Airway Management Planned: Double Lumen EBT  Additional Equipment: Arterial line and CVP  Intra-op Plan:   Post-operative Plan: Extubation in OR  Informed Consent: I have reviewed the patients History and Physical, chart, labs and discussed the procedure including the risks, benefits and alternatives for the proposed anesthesia with the patient or authorized representative who has indicated his/her understanding and acceptance.   Dental advisory given  Plan Discussed with: CRNA, Anesthesiologist and Surgeon  Anesthesia Plan Comments:         Anesthesia Quick Evaluation

## 2015-07-25 NOTE — Anesthesia Procedure Notes (Signed)
Procedure Name: Intubation Date/Time: 07/25/2015 1:06 PM Performed by: Suzy Bouchard Pre-anesthesia Checklist: Patient identified, Emergency Drugs available, Suction available, Patient being monitored and Timeout performed Patient Re-evaluated:Patient Re-evaluated prior to inductionOxygen Delivery Method: Circle system utilized Preoxygenation: Pre-oxygenation with 100% oxygen Intubation Type: IV induction Ventilation: Mask ventilation without difficulty Laryngoscope Size: Miller and 2 Grade View: Grade I Tube type: Oral Endobronchial tube: Left and 37 Fr Number of attempts: 1 Airway Equipment and Method: Stylet Placement Confirmation: ETT inserted through vocal cords under direct vision,  positive ETCO2 and breath sounds checked- equal and bilateral Tube secured with: Tape Dental Injury: Teeth and Oropharynx as per pre-operative assessment

## 2015-07-25 NOTE — Progress Notes (Signed)
The patient was examined and preop studies reviewed. There has been no change from the prior exam and the patient is ready for surgery.   Plan Right VATS and pulmonary resection on D Baltazar Najjar

## 2015-07-25 NOTE — Anesthesia Postprocedure Evaluation (Signed)
Anesthesia Post Note  Patient: [name]  Procedure(s) Performed: Procedure(s) (LRB): Right VIDEO ASSISTED THORACOSCOPY with Right lower lobe lobectomy and Insertion of ONQ pain pump (Right) LYMPH NODE DISSECTION (Right)  Anesthesia type: general  Patient location: PACU  Post pain: Pain level controlled  Post assessment: Patient's Cardiovascular Status Stable  Last Vitals:  Filed Vitals:   07/25/15 1755  BP: 145/61  Pulse: 68  Temp: 37.2 C  Resp: 15    Post vital signs: Reviewed and stable  Level of consciousness: sedated  Complications: No apparent anesthesia complications

## 2015-07-25 NOTE — Progress Notes (Signed)
Dr. Darcey Nora notified of ABG results at [place]. ABG on 5L [place], ETCO2 15, RR 15-18. Patient sleepy. 7.25/59.5/84/26/28 sat 94% and BE -2. Received orders for Vision Bipap  With pressures of 12/6 and rate of 14. To obtain ABG 1 hour after started. Started on BIPAP @ 1900 by RT.  Patient became agitated and would not stop trying to pull of Bipap. Was placed on non-violent wrist restraints.

## 2015-07-25 NOTE — Progress Notes (Signed)
Patient in a lot of pain crying out. Patient given .'5mg'$  dilaudid IV. Patient then became non responsive with O2 sats dropping into the 70's. FIO2 increased to 15 liters; nasal airway placed. Dr. Tobias Alexander notified. Patient became more responsive with sternal rub; O2 sats increasing to mid 90's. Dr. Darcey Nora at bedside; ABG ordered and sent; chest xray pending.

## 2015-07-25 NOTE — Transfer of Care (Signed)
Immediate Anesthesia Transfer of Care Note  Patient: [name]  Procedure(s) Performed: Procedure(s): Right VIDEO ASSISTED THORACOSCOPY with Right lower lobe lobectomy and Insertion of ONQ pain pump (Right) LYMPH NODE DISSECTION (Right)  Patient Location: PACU  Anesthesia Type:General  Level of Consciousness: awake and alert   Airway & Oxygen Therapy: Patient Spontanous Breathing and Patient connected to face mask oxygen  Post-op Assessment: Report given to RN, Post -op Vital signs reviewed and stable and Patient moving all extremities  Post vital signs: Reviewed and stable  Last Vitals:  Filed Vitals:   07/25/15 1215  BP:   Pulse: 54  Temp:   Resp: 18    Complications: No apparent anesthesia complications

## 2015-07-25 NOTE — Brief Op Note (Signed)
07/25/2015  3:42 PM  PATIENT:  [name]  64 y.o. female  PRE-OPERATIVE DIAGNOSIS:  Biopsy proven squamous cell carcinoma RLL  POST-OPERATIVE DIAGNOSIS:  Biopsy proven squamous cell carcinoma RLL   PROCEDURE:  RIGHT VIDEO ASSISTED THORACOSCOPY, RIGHT LOWER LOBECTOMY with LYMPH NODE DISSECTION, and ON Q PLACEMENT   SURGEON:  Surgeon(s) and Role:    * Ivin Poot, MD - Primary  PHYSICIAN ASSISTANT: Lars Pinks PA-C  ANESTHESIA:   general  EBL:  Total I/O In: -  Out: 355 [Urine:305; Blood:50]  BLOOD ADMINISTERED:none  DRAINS: 19 French straight chest tube and a 36 French angled chest tube placed in the right pleural space   LOCAL MEDICATIONS USED:  MARCAINE     SPECIMEN:  Source of Specimen:  RLL and multiple lymph nodes  DISPOSITION OF SPECIMEN:  Pathlolgy. Frozen section confirmed RLL squamous cell carcinoma and margins negative for cancer  COUNTS CORRECT:  YES  DICTATION: .Dragon Dictation  PLAN OF CARE: Admit to inpatient   PATIENT DISPOSITION:  PACU - hemodynamically stable.   Delay start of Pharmacological VTE agent (>24hrs) due to surgical blood loss or risk of bleeding: yes

## 2015-07-26 ENCOUNTER — Inpatient Hospital Stay (HOSPITAL_COMMUNITY): Payer: Medicare Other

## 2015-07-26 ENCOUNTER — Encounter (HOSPITAL_COMMUNITY): Payer: Self-pay | Admitting: Cardiothoracic Surgery

## 2015-07-26 LAB — GLUCOSE, CAPILLARY
Glucose-Capillary: 155 mg/dL — ABNORMAL HIGH (ref 65–99)
Glucose-Capillary: 129 mg/dL — ABNORMAL HIGH (ref 65–99)
Glucose-Capillary: 98 mg/dL (ref 65–99)
Glucose-Capillary: 111 mg/dL — ABNORMAL HIGH (ref 65–99)
Glucose-Capillary: 124 mg/dL — ABNORMAL HIGH (ref 65–99)

## 2015-07-26 LAB — BASIC METABOLIC PANEL
Anion gap: 7 (ref 5–15)
BUN: 7 mg/dL (ref 6–20)
CALCIUM: 8.1 mg/dL — AB (ref 8.9–10.3)
CO2: 26 mmol/L (ref 22–32)
CREATININE: 0.53 mg/dL (ref 0.44–1.00)
Chloride: 102 mmol/L (ref 101–111)
GFR calc Af Amer: >60 mL/min (ref >60)
GLUCOSE: 137 mg/dL — AB (ref 65–99)
Potassium: 4 mmol/L (ref 3.5–5.1)
Sodium: 135 mmol/L (ref 135–145)

## 2015-07-26 LAB — CBC
HEMATOCRIT: 38.3 % (ref 36.0–46.0)
Hemoglobin: 12.5 g/dL (ref 12.0–15.0)
MCH: 30.1 pg (ref 26.0–34.0)
MCHC: 32.6 g/dL (ref 30.0–36.0)
MCV: 92.3 fL (ref 78.0–100.0)
PLATELETS: 213 10*3/uL (ref 150–400)
RBC: 4.15 MIL/uL (ref 3.87–5.11)
RDW: 13.2 % (ref 11.5–15.5)
WBC: 11.2 10*3/uL — AB (ref 4.0–10.5)

## 2015-07-26 MED ORDER — SIMETHICONE 80 MG PO CHEW
80.0000 mg | CHEWABLE_TABLET | Freq: Four times a day (QID) | ORAL | Status: DC | PRN
  Administered 2015-07-26: 80 mg via ORAL
  Filled 2015-07-26 (×2): qty 1

## 2015-07-26 MED ORDER — LEVALBUTEROL HCL 1.25 MG/0.5ML IN NEBU
1.2500 mg | INHALATION_SOLUTION | Freq: Four times a day (QID) | RESPIRATORY_TRACT | Status: DC
  Administered 2015-07-26 – 2015-07-27 (×3): 1.25 mg via RESPIRATORY_TRACT
  Filled 2015-07-26 (×7): qty 0.5

## 2015-07-26 MED ORDER — HYDROCODONE-ACETAMINOPHEN 5-325 MG PO TABS
1.0000 | ORAL_TABLET | ORAL | Status: DC | PRN
  Administered 2015-07-26 – 2015-07-27 (×3): 1 via ORAL
  Filled 2015-07-26 (×3): qty 1

## 2015-07-26 MED ORDER — METOCLOPRAMIDE HCL 5 MG/ML IJ SOLN
10.0000 mg | Freq: Four times a day (QID) | INTRAMUSCULAR | Status: DC
  Filled 2015-07-26: qty 2

## 2015-07-26 MED ORDER — DEXTROSE-NACL 5-0.9 % IV SOLN: INTRAVENOUS | Status: DC

## 2015-07-26 NOTE — Progress Notes (Signed)
Patient had friend bring iPad and cell phone to room, re advised patient of belongings policy, patient states, "I know, I understand".  Rowe Pavy, RN

## 2015-07-26 NOTE — Progress Notes (Signed)
Pt. Arterial line was not functioning properly and had to be pulled. RN present and aware.

## 2015-07-26 NOTE — Care Management Note (Signed)
Case Management Note  Patient Details  Name: [name] MRN: [id number] Date of Birth: 07/25/1951  Subjective/Objective:        Lives at home alone, [place] big farm house with dogs.  States the farm house has been divided into three apts.  Her area is the only one livable at this time.  3 friends are watching the dogs now.  All family from out of town.  Hoping that she can have [place] on discharge.  Explained that the only come in 30-60 minutes 2-3 times a week.  States she needed someone to cook and do laundry.  Offered a pvt duty list, but she declined.  Discussed possibility of rehab facility short term.  Adamantly refused, stated she had to get home to dogs.  Again explored her options of assistance at home.  States her friends would be able to "look in on her", but she would have to go home for the dogs.   Only day one, will continue to follow for progression.         Action/Plan:   Expected Discharge Date:                  Expected Discharge Plan:  [place]  In-House Referral:     Discharge planning Services  CM Consult  Post Acute Care Choice:    Choice offered to:     DME Arranged:    DME Agency:     HH Arranged:    [place] Agency:     Status of Service:  In process, will continue to follow  Medicare Important Message Given:    Date Medicare IM Given:    Medicare IM give by:    Date Additional Medicare IM Given:    Additional Medicare Important Message give by:     If discussed at [place] of Stay Meetings, dates discussed:    Additional Comments:  [name], RN 07/26/2015, 9:58 AM

## 2015-07-26 NOTE — Progress Notes (Signed)
Utilization review completed. Akiya Morr, RN, BSN. 

## 2015-07-26 NOTE — Progress Notes (Signed)
1 Day Post-Op Procedure(s) (LRB): Right VIDEO ASSISTED THORACOSCOPY with Right lower lobe lobectomy and Insertion of ONQ pain pump (Right) LYMPH NODE DISSECTION (Right) Subjective: OOB to chair Small air leak with cough CXR clear NSR Objective: Vital signs in last 24 hours: Temp:  [97 F (36.1 C)-99 F (37.2 C)] 98.5 F (36.9 C) (09/15 0741) Pulse Rate:  [52-76] 63 (09/15 0800) Cardiac Rhythm:  [-] Normal sinus rhythm (09/15 0806) Resp:  [14-31] 22 (09/15 0800) BP: (126-173)/(52-80) 138/55 mmHg (09/15 0800) SpO2:  [70 %-100 %] 99 % (09/15 0833) Arterial Line BP: (103-236)/(44-186) 103/85 mmHg (09/15 0445) FiO2 (%):  [30 %] 30 % (09/14 2000) Weight:  [140 lb 14 oz (63.9 kg)-144 lb 2.9 oz (65.4 kg)] 144 lb 2.9 oz (65.4 kg) (09/15 0600)  Hemodynamic parameters for last 24 hours:  stable  Intake/Output from previous day: 09/14 0701 - 09/15 0700 In: 2655 [P.O.:60; I.V.:2500; IV Piggyback:50] Out: 1480 [Urine:1050; Blood:50; Chest Tube:380] Intake/Output this shift: Total I/O In: 150 [I.V.:100; IV Piggyback:50] Out: 80 [Urine:60; Chest Tube:20]  Scattered rhonchi Neuro baseline  Lab Results:  Recent Labs  07/26/15 0430  WBC 11.2*  HGB 12.5  HCT 38.3  PLT 213   BMET:  Recent Labs  07/26/15 0430  NA 135  K 4.0  CL 102  CO2 26  GLUCOSE 137*  BUN 7  CREATININE 0.53  CALCIUM 8.1*    PT/INR: No results for input(s): LABPROT, INR in the last 72 hours. ABG    Component Value Date/Time   PHART 7.313* 07/25/2015 2019   HCO3 26.4* 07/25/2015 2019   TCO2 28 07/25/2015 2019   ACIDBASEDEF 1.0 07/25/2015 2019   O2SAT 99.0 07/25/2015 2019   CBG (last 3)   Recent Labs  07/25/15 2343 07/26/15 0739  GLUCAP 150* 155*    Assessment/Plan: S/P Procedure(s) (LRB): Right VIDEO ASSISTED THORACOSCOPY with Right lower lobe lobectomy and Insertion of ONQ pain pump (Right) LYMPH NODE DISSECTION (Right) Mobilize d/c tubes/lines continue periop antibiotics  Cont  PCA   LOS: 1 day    Tharon Aquas Trigt III 07/26/2015

## 2015-07-26 NOTE — Progress Notes (Signed)
RT attempted to stick pt. For arterial blood gas but was unsuccessful and pt. Stated she couldn't take the pain anymore and requested to stop the procedure. Pt. Refused to be re-stuck.

## 2015-07-26 NOTE — Op Note (Signed)
NAMELUDENE, STOKKE NO.:  [id number]  MEDICAL RECORD NO.:  [id number]  LOCATION:  2S04C                        FACILITY:  [facility]  PHYSICIAN:  Ivin Poot, M.D.  DATE OF BIRTH:  21-Aug-1951  DATE OF PROCEDURE:  07/25/2015 DATE OF DISCHARGE:                              OPERATIVE REPORT   OPERATIONS: 1. Video-assisted thoracoscopic surgery - right VATS. 2. Right mini thoracotomy and right lower lobe lobectomy. 3. Mediastinal lymph node dissection. 4. Placement of wound, On-Q analgesic irrigation system.  SURGEON:  Ivin Poot, M.D.  ASSISTANT:  Lars Pinks, PA-C  PREOPERATIVE DIAGNOSIS:  Squamous cell carcinoma of the right lower lobe.  POSTOPERATIVE DIAGNOSIS:  Squamous cell carcinoma of the right lower lobe.  ANESTHESIA:  General.  INDICATIONS:  The patient is a 64 year old Caucasian female, smoker. Screening CT scan demonstrated a right lower lobe nodular density when she was living in [place].  She underwent a PET scan, which showed this to be hypermetabolic and a transthoracic needle biopsy showed this to be squamous cell.  She then moved to [facility] and was evaluated in the Multidisciplinary Thoracic Oncology Clinic.  She was felt to be a candidate for surgical resection.  Brain scan was negative and there was no evidence of distant metastatic disease.  I discussed the procedure of right VATS, right lower lobectomy with the patient including the indications, benefits, alternatives, and risks.  She understood that those risks included the risks of bleeding, blood transfusion, postoperative lung problems including pneumonia, pleural effusion or persistent air leak, stroke, system failures, and death.  She agreed to proceed with surgery under what I felt was an informed consent.  OPERATIVE FINDINGS: 1. The 2-cm nodules in the superior segment of the right lower lobe.     Lobectomy was the best surgical option. 2. No blood  products required for the surgery.  DESCRIPTION OF PROCEDURE:  The patient was brought to the operating room and placed supine on the operating room table where general anesthesia was induced.  A double-lumen endotracheal tube was positioned by the Anesthesia Team.  The patient was turned to expose right side up.  Right chest was prepped and draped as a sterile field.  A proper time-out was performed.  A small incision was made beneath the tip of the scapula and the pleural space entered.  The VATS camera was inserted.  The right lung was examined.  There were changes of chronic smoking.  There were minimal adhesions.  There were no visible masses or nodules.  The pleural surface was clean.  The scope was withdrawn.  A small incision was made in the fifth interspace from the tip of the scapula anteriorly for approximately 3 inches.  The ribs were gently spread.  I was able to palpate the superior segment of the right lower lobe and felt the nodular density in the central area of the lung.  It was felt that lobectomy would be the best option.  The fissure was dissected in the pulmonary artery.  The lower lobe was identified and encircled with a vessel loop.  There was a separate large branch to the superior segment.  The main trunk was  stapled and divided with the Endo-GIA stapling device.  The separate branch of the right upper lobe was separately stapled and divided.  Next, the inferior vein was dissected and encircled with a vessel loop. It was stapled and divided with the Endo-GIA stapling device as the inferior ligament was taken down.  Next, the fissures were completed between the lower lobe and the middle lobe and the lower lobe and the upper lobe with the Endo-GIA stapling devices.  This left the bronchus.  The bronchus was then clamped with a stapling device and the right lung was ventilated and there was good re- expansion of the middle lobe and the upper lobe.  The stapler  was then fired and the bronchus was divided distal to the staple line and the specimen was removed.  The hilum was inspected.  There were several hilar nodes, which were dissected and submitted separately.  Hemostasis was good.  The staple lines were reinforced with biologic adhesive - Coseal.  Chest tubes were placed in the anterior and posterior pleural spaces and brought out through separate incisions.  The lungs were then inflated under direct vision.  The ribs were reapproximated with #2 Vicryl pericostals.  The muscle layers were closed with interrupted #1 Vicryl.  The subcutaneous and skin layers were closed in running Vicryl.  An On-Q catheter had been placed in the muscle where layer of the chest wall secured to the skin and connected with reservoir of 0.5% Marcaine.  The patient was then rolled supine, extubated and returned to the recovery room in stable condition.     Ivin Poot, M.D.     PV/MEDQ  D:  07/25/2015  T:  07/26/2015  Job:  [id number]  cc:   Eilleen Kempf, M.D.

## 2015-07-27 ENCOUNTER — Inpatient Hospital Stay (HOSPITAL_COMMUNITY): Payer: Medicare Other

## 2015-07-27 LAB — GLUCOSE, CAPILLARY
Glucose-Capillary: 120 mg/dL — ABNORMAL HIGH (ref 65–99)
Glucose-Capillary: 138 mg/dL — ABNORMAL HIGH (ref 65–99)
Glucose-Capillary: 126 mg/dL — ABNORMAL HIGH (ref 65–99)
Glucose-Capillary: 96 mg/dL (ref 65–99)
Glucose-Capillary: 184 mg/dL — ABNORMAL HIGH (ref 65–99)

## 2015-07-27 LAB — CBC
HCT: 35.7 % — ABNORMAL LOW (ref 36.0–46.0)
Hemoglobin: 12.4 g/dL (ref 12.0–15.0)
MCH: 31.5 pg (ref 26.0–34.0)
MCHC: 34.7 g/dL (ref 30.0–36.0)
MCV: 90.6 fL (ref 78.0–100.0)
PLATELETS: 181 10*3/uL (ref 150–400)
RBC: 3.94 MIL/uL (ref 3.87–5.11)
RDW: 12.7 % (ref 11.5–15.5)
WBC: 14.5 10*3/uL — AB (ref 4.0–10.5)

## 2015-07-27 LAB — COMPREHENSIVE METABOLIC PANEL
ALBUMIN: 3.2 g/dL — AB (ref 3.5–5.0)
ALT: 16 U/L (ref 14–54)
ANION GAP: 8 (ref 5–15)
AST: 26 U/L (ref 15–41)
Alkaline Phosphatase: 56 U/L (ref 38–126)
CALCIUM: 8.7 mg/dL — AB (ref 8.9–10.3)
CHLORIDE: 90 mmol/L — AB (ref 101–111)
CO2: 28 mmol/L (ref 22–32)
Creatinine, Ser: 0.54 mg/dL (ref 0.44–1.00)
GFR calc non Af Amer: >60 mL/min (ref >60)
Glucose, Bld: 115 mg/dL — ABNORMAL HIGH (ref 65–99)
Potassium: 3.6 mmol/L (ref 3.5–5.1)
SODIUM: 126 mmol/L — AB (ref 135–145)
TOTAL PROTEIN: 5.9 g/dL — AB (ref 6.5–8.1)
Total Bilirubin: 0.7 mg/dL (ref 0.3–1.2)

## 2015-07-27 LAB — BLOOD PRODUCT ORDER (VERBAL) VERIFICATION

## 2015-07-27 MED ORDER — HYDROCODONE-ACETAMINOPHEN 7.5-325 MG PO TABS
1.0000 | ORAL_TABLET | Freq: Four times a day (QID) | ORAL | Status: DC | PRN
  Administered 2015-07-27 – 2015-07-29 (×9): 1 via ORAL
  Filled 2015-07-27 (×10): qty 1

## 2015-07-27 MED ORDER — POTASSIUM CHLORIDE 10 MEQ/50ML IV SOLN
10.0000 meq | INTRAVENOUS | Status: AC
  Administered 2015-07-27 (×3): 10 meq via INTRAVENOUS
  Filled 2015-07-27: qty 50

## 2015-07-27 MED ORDER — METOCLOPRAMIDE HCL 5 MG/ML IJ SOLN
10.0000 mg | Freq: Four times a day (QID) | INTRAMUSCULAR | Status: AC
  Administered 2015-07-27 – 2015-07-28 (×4): 10 mg via INTRAVENOUS
  Filled 2015-07-27 (×4): qty 2

## 2015-07-27 MED ORDER — LEVALBUTEROL HCL 1.25 MG/0.5ML IN NEBU
1.2500 mg | INHALATION_SOLUTION | Freq: Four times a day (QID) | RESPIRATORY_TRACT | Status: DC | PRN
  Filled 2015-07-27: qty 0.5

## 2015-07-27 NOTE — Progress Notes (Signed)
Wasted 46cc Fentanyl PCA with Wayne Both, RN.

## 2015-07-27 NOTE — Care Management Important Message (Signed)
Important Message  Patient Details  Name: [name] MRN: [id number] Date of Birth: Mar 11, 1951   Medicare Important Message Given:  Yes-second notification given    Nathen May 07/27/2015, 11:07 AM

## 2015-07-27 NOTE — Progress Notes (Addendum)
[facility] DAILY ICU PROGRESS NOTE                   [address]            [place] [address]          [phone]   2 Days Post-Op Procedure(s) (LRB): Right VIDEO ASSISTED THORACOSCOPY with Right lower lobe lobectomy and Insertion of ONQ pain pump (Right) LYMPH NODE DISSECTION (Right)  Total Length of Stay:  LOS: 2 days   Subjective: Having a lot of nausea. Passing flatus, no vomiting.  States she can't take the Fentanyl because it makes her sick and feel "funny" in the head.  Breathing stable.   Objective: Vital signs in last 24 hours: Temp:  [97.7 F (36.5 C)-98.9 F (37.2 C)] 98.4 F (36.9 C) (09/16 0411) Pulse Rate:  [57-73] 61 (09/16 0600) Cardiac Rhythm:  [-] Normal sinus rhythm (09/16 0400) Resp:  [17-27] 21 (09/16 0600) BP: (122-160)/(49-95) 152/59 mmHg (09/16 0600) SpO2:  [92 %-100 %] 97 % (09/16 0600) Weight:  [134 lb 4.2 oz (60.9 kg)] 134 lb 4.2 oz (60.9 kg) (09/16 0500)  Filed Weights   07/25/15 1839 07/26/15 0600 07/27/15 0500  Weight: 140 lb 14 oz (63.9 kg) 144 lb 2.9 oz (65.4 kg) 134 lb 4.2 oz (60.9 kg)    Weight change: -6 lb 11.8 oz (-3.057 kg)   Hemodynamic parameters for last 24 hours:    Intake/Output from previous day: 09/15 0701 - 09/16 0700 In: 610 [I.V.:560; IV Piggyback:50] Out: 1870 [IDPOE:4235; Chest Tube:155]  Intake/Output this shift:    Current Meds: Scheduled Meds: . acetaminophen  1,000 mg Oral 4 times per day   Or  . acetaminophen (TYLENOL) oral liquid 160 mg/5 mL  1,000 mg Oral 4 times per day  . atorvastatin  20 mg Oral Daily  . bisacodyl  10 mg Oral Daily  . fentaNYL   Intravenous 6 times per day  . flecainide  25 mg Oral BID  . levalbuterol  1.25 mg Nebulization Q6H  . metoprolol  12.5 mg Oral BID  . pantoprazole  40 mg Oral Daily  . PARoxetine  10 mg Oral Daily  . potassium chloride  10 mEq Intravenous Q1 Hr x 3  . senna-docusate  1 tablet Oral QHS  . tiotropium  18 mcg Inhalation Daily  . traZODone   50 mg Oral QHS   Continuous Infusions: . bupivacaine ON-Q pain pump    . dextrose 5 % and 0.9% NaCl 20 mL/hr at 07/27/15 0400   PRN Meds:.acetaminophen, hydrALAZINE, HYDROcodone-acetaminophen, naloxone **AND** sodium chloride, ondansetron (ZOFRAN) IV, ondansetron (ZOFRAN) IV, potassium chloride, simethicone   Physical Exam: General appearance: alert, cooperative and no distress Heart: regular rate and rhythm Lungs: Decreased BS in bases Abdomen: soft, non-tender; bowel sounds normal; no masses,  no organomegaly Wound: Dressed and dry Chest tube: No air leak    Lab Results: CBC:  Recent Labs  07/26/15 0430 07/27/15 0400  WBC 11.2* 14.5*  HGB 12.5 12.4  HCT 38.3 35.7*  PLT 213 181   BMET:   Recent Labs  07/26/15 0430 07/27/15 0400  NA 135 126*  K 4.0 3.6  CL 102 90*  CO2 26 28  GLUCOSE 137* 115*  BUN 7 <5*  CREATININE 0.53 0.54  CALCIUM 8.1* 8.7*    PT/INR: No results for input(s): LABPROT, INR in the last 72 hours. Radiology: Dg Chest Port 1 View  07/27/2015   CLINICAL DATA:  Shortness  of breath.  EXAM: PORTABLE CHEST - 1 VIEW  COMPARISON:  July 26, 2015  FINDINGS: The chest tubes on the right are unchanged in position. Central catheter tip is in the superior vena cava. There is currently a minimal right apical pneumothorax. There is subcutaneous air on the right, stable. There is patchy bibasilar atelectasis, slightly more on the left than on the right, stable. No new opacity. No change in cardiac silhouette. No adenopathy.  IMPRESSION: Left greater than right base atelectasis, stable. Chest tubes with remain stable appearance on the right. There is a minimal right apical pneumothorax. There is stable subcutaneous air on the right. No change in cardiac silhouette.   Electronically Signed   By: Lowella Grip III M.D.   On: 07/27/2015 07:38     Assessment/Plan: S/P Procedure(s) (LRB): Right VIDEO ASSISTED THORACOSCOPY with Right lower lobe lobectomy and  Insertion of ONQ pain pump (Right) LYMPH NODE DISSECTION (Right)  CT with no air leak, CXR stable with tiny apical ptx. Will d/c posterior CT and place anterior tube to water seal.  GI- nauseated, Zofran not helping. Will add scheduled Reglan for a few doses and watch.   Pain- PCA making her sick, so will d/c Fentanyl and start po Hydrocodone at pt request.  Hyponatremia- saline lock IVF and watch.  Mobilize, continue pulm toilet, d/c Foley.  Tx stepdown.  COLLINS,GINA H 07/27/2015 7:52 AM  Remove one chest tube, stop PCA and move to stepdown patient examined and medical record reviewed,agree with above note. Tharon Aquas Trigt III 07/27/2015

## 2015-07-27 NOTE — Progress Notes (Signed)
Patient ID: [name], female   DOB: 01/16/1951, 64 y.o.   MRN: [id number] EVENING ROUNDS NOTE :     [address]       [place] [address]             [phone]                 2 Days Post-Op Procedure(s) (LRB): Right VIDEO ASSISTED THORACOSCOPY with Right lower lobe lobectomy and Insertion of ONQ pain pump (Right) LYMPH NODE DISSECTION (Right)  Total Length of Stay:  LOS: 2 days  BP 151/61 mmHg  Pulse 76  Temp(Src) 98.3 F (36.8 C) (Oral)  Resp 23  Ht 5' 2.99" (1.6 m)  Wt 134 lb 4.2 oz (60.9 kg)  BMI 23.79 kg/m2  SpO2 93%  .Intake/Output      09/15 0701 - 09/16 0700 09/16 0701 - 09/17 0700   P.O. 120 480   I.V. (mL/kg) 660 (10.8) 540 (8.9)   Other     IV Piggyback 50    Total Intake(mL/kg) 830 (13.6) 1020 (16.7)   Urine (mL/kg/hr) 1940 (1.3) 535 (0.7)   Blood     Chest Tube 155 (0.1) 227 (0.3)   Total Output 2095 762   Net -1265 +258          . bupivacaine ON-Q pain pump       Lab Results  Component Value Date   WBC 14.5* 07/27/2015   HGB 12.4 07/27/2015   HCT 35.7* 07/27/2015   PLT 181 07/27/2015   GLUCOSE 115* 07/27/2015   CHOL 203* 06/27/2015   TRIG 124 06/27/2015   HDL 57 06/27/2015   LDLCALC 121 06/27/2015   ALT 16 07/27/2015   AST 26 07/27/2015   NA 126* 07/27/2015   K 3.6 07/27/2015   CL 90* 07/27/2015   CREATININE 0.54 07/27/2015   BUN <5* 07/27/2015   CO2 28 07/27/2015   TSH 1.803 06/27/2015   INR 0.99 07/19/2015   HGBA1C 6.4* 06/27/2015   stable postop Up to chair No air leak currently   Grace Isaac MD  Beeper [phone] Office [phone] 07/27/2015 6:53 PM

## 2015-07-27 NOTE — Discharge Summary (Signed)
[address]       [place] [address]             [phone]              Discharge Summary  Name: [name] DOB: 04-17-1951 64 y.o. MRN: [id number]   Admission Date: 07/25/2015 Discharge Date: 07/28/2015    Admitting Diagnosis: Squamous cell carcinoma, right lower lobe   Discharge Diagnosis:  Squamous cell carcinoma, right lower lobe  Past Medical History  Diagnosis Date  . Hyperlipidemia 2001  . HEARING LOSS     since age 40  . Hypertension 2001  . Tinnitus 2006    disabling  . Fracture 2006    left foot & ankle , immobilized for healing   . Diverticulosis 2008    diagnosed; pt. states now cured 07/19/15  . Panic disorder     was followed by mental health  . Diverticulitis     pt reports 8 times. Dr. Geroge Baseman colectomy in 2009  . Night sweats     Per medical history form dated 05/02/11.  . Wears glasses   . Menopause     per medical history form  . Gout     Recently diagnosed.  . Wears dentures     Per medical history form dated 05/02/11.  . Acute GI bleeding 01/28/2012  . 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 Diastolic dysfunction 0/93/2355    Grade 1  . SVT (supraventricular tachycardia)     s/p ablation 10-20-2013 by Dr Lovena Le  . COPD (chronic obstructive pulmonary disease)   . Chronic bronchitis   . SOB (shortness of breath)     "after lying in bed, go to the bathroom; heart races & I'm SOB" (10/20/2013)  . On home oxygen therapy     "2L only at night" (10/20/2013); pt. currently not wearing O2 at night (07/19/15)  . History of blood transfusion     "probably when I was young, when I was 15" (10/20/2013)  . Daily headache     Patient stated they are felt in back of the head, not throbing. But always in same spot. MRI's done, no reason why they occur. (10/20/2013)  . Arthritis     "qwhere; hands, feet, overall stiffness" (10/20/2013)  . Fibromyalgia   . Chronic lower back pain   . Complication  of anesthesia     "lungs quit working during [facility] in [place]" (10/20/2013); pt. states that she can't breathe after surgery when laying on back  . Sleep apnea 2001    non compliant wit the use of the machine  . Sleep apnea     wear oxygen at bedtime.   . Anemia due to blood loss, acute 01/28/2012  . Hiatal hernia     "repaired"   . Dysrhythmia     pt. states not since ablation  . History of pneumonia   . Depression   . Cancer     lung cancer  . Hepatitis C 1993     Needs Hepatic panel every 6   months, treated for 1 year      Procedures: RIGHT VIDEO ASSISTED THORACOSCOPY/MINI-THORACOTOMY   Right lower lobe lobectomy   Mediastinal lymph node dissection -  07/25/2015   HPI:  The patient is a 64 y.o. female with a long history of smoking. She underwent evaluation by a pulmonologist in [place] for COPD and possible sleep apnea. A  CT scan of the chest was part of this evaluation which ultimately diagnosed sleep apnea. A right lower lobe 2 cm nodular density was noted. Followup PET scan showed this to be hypermetabolic without any other hypermetabolic areas in the lung or mediastinal nodes or distant sites. She then underwent a transthoracic needle aspirate of this nodule which was positive for squamous cell carcinoma. This a clinical stage IA tumor.  The patient had complete pulmonary function tests in [place] which showed FEV1 of 2.3 and DLCO 60% predicted. Her room air PO2 on blood gas was 80 with a PCO2 of 40.  The patient presented to the thoracic oncology clinic at [facility] for further recommendations. Dr. [place] Gum saw the patient and reviewed her studies.  It was felt that her pulmonary function was adequate to proceed with surgical resection. All risks, benefits and alternatives of surgery were explained in detail, and the patient agreed to proceed.     Hospital Course:  The patient was admitted to [facility] on 07/25/2015. The patient was taken to the operating room and underwent  the above procedure.    The postoperative course has generally been uneventful. She initially had some nausea related to her PCA and this was discontinued.  Pain has been controlled with oral pain medication and her nausea resolved.  Her chest tubes showed no air leak and minimal drainage and chest x-ray remained stable with a tiny apical pneumothorax.  Chest tubes were removed in the standard fashion and follow up chest x-rays have remained stable.  Incisions are healing well.  She is ambulating in the halls without difficulty and is tolerating a regular diet. Final pathology confirmed squamous cell carcinoma (T1a, N0).  The patient has overall progressed well postoperatively and is presently medically stable for discharge home.    Recent vital signs:  Filed Vitals:   07/29/15 0931  BP: 108/45  Pulse: 69  Temp:   Resp:     Recent laboratory studies:  CBC:  Recent Labs  07/27/15 0400 07/28/15 0512  WBC 14.5* 10.8*  HGB 12.4 11.7*  HCT 35.7* 34.9*  PLT 181 185   BMET:   Recent Labs  07/27/15 0400 07/28/15 0512  NA 126* 130*  K 3.6 3.5  CL 90* 91*  CO2 28 32  GLUCOSE 115* 101*  BUN <5* 5*  CREATININE 0.54 0.52  CALCIUM 8.7* 8.7*    PT/INR: No results for input(s): LABPROT, INR in the last 72 hours.   Discharge Medications:     Medication List    STOP taking these medications        HYDROcodone-acetaminophen 5-325 MG per tablet  Commonly known as:  NORCO/VICODIN  Replaced by:  HYDROcodone-acetaminophen 7.5-325 MG per tablet      TAKE these medications        atorvastatin 20 MG tablet  Commonly known as:  LIPITOR  Take 20 mg by mouth daily.     BIOTIN MAXIMUM STRENGTH 10 MG Tabs  Generic drug:  Biotin  Take by mouth.     EPIPEN 2-PAK 0.3 mg/0.3 mL Soaj injection  Generic drug:  EPINEPHrine  USE AS DIRECTED     flecainide 50 MG tablet  Commonly known as:  TAMBOCOR  Take 0.5 tablets (25 mg total) by mouth 2 (two) times daily.      HYDROcodone-acetaminophen 7.5-325 MG per tablet  Commonly known as:  NORCO  Take 1 tablet by mouth every 6 (six) hours as needed for moderate pain.  metoprolol 50 MG tablet  Commonly known as:  LOPRESSOR  Take 25 mg by mouth 2 (two) times daily.     PARoxetine 10 MG tablet  Commonly known as:  PAXIL  Take 1 tablet (10 mg total) by mouth daily.     polyethylene glycol powder powder  Commonly known as:  GLYCOLAX/MIRALAX  Take 1 Container by mouth once.     tiotropium 18 MCG inhalation capsule  Commonly known as:  SPIRIVA  Place 18 mcg into inhaler and inhale daily.     traZODone 50 MG tablet  Commonly known as:  DESYREL  Take 1 tablet (50 mg total) by mouth at bedtime.         Discharge Instructions:  The patient is to refrain from driving, heavy lifting or strenuous activity.  May shower daily and clean incisions with soap and water.  May resume regular diet.   Follow Up: Follow-up Information    Follow up with Ivin Poot III, MD On 08/15/2015.   Specialty:  Cardiothoracic Surgery   Why:  Have a chest x-ray at [facility] at 2:30, then see MD at 3:30   Contact information:   [address] [place] [address] [phone]       Follow up with [facility] RN On 08/03/2015.   Why:  For suture removal at 10:30        COLLINS,GINA H 07/29/2015, 9:33 AM

## 2015-07-28 ENCOUNTER — Inpatient Hospital Stay (HOSPITAL_COMMUNITY): Payer: Medicare Other

## 2015-07-28 LAB — CBC
HCT: 34.9 % — ABNORMAL LOW (ref 36.0–46.0)
Hemoglobin: 11.7 g/dL — ABNORMAL LOW (ref 12.0–15.0)
MCH: 30.3 pg (ref 26.0–34.0)
MCHC: 33.5 g/dL (ref 30.0–36.0)
MCV: 90.4 fL (ref 78.0–100.0)
Platelets: 185 10*3/uL (ref 150–400)
RBC: 3.86 MIL/uL — ABNORMAL LOW (ref 3.87–5.11)
RDW: 12.9 % (ref 11.5–15.5)
WBC: 10.8 10*3/uL — ABNORMAL HIGH (ref 4.0–10.5)

## 2015-07-28 LAB — TYPE AND SCREEN
ABO/RH(D): A POS
Antibody Screen: NEGATIVE
Unit division: 0
Unit division: 0

## 2015-07-28 LAB — BASIC METABOLIC PANEL
Anion gap: 7 (ref 5–15)
BUN: 5 mg/dL — ABNORMAL LOW (ref 6–20)
CO2: 32 mmol/L (ref 22–32)
Calcium: 8.7 mg/dL — ABNORMAL LOW (ref 8.9–10.3)
Chloride: 91 mmol/L — ABNORMAL LOW (ref 101–111)
Creatinine, Ser: 0.52 mg/dL (ref 0.44–1.00)
GFR calc Af Amer: >60 mL/min (ref >60)
GFR calc non Af Amer: >60 mL/min (ref >60)
Glucose, Bld: 101 mg/dL — ABNORMAL HIGH (ref 65–99)
Potassium: 3.5 mmol/L (ref 3.5–5.1)
Sodium: 130 mmol/L — ABNORMAL LOW (ref 135–145)

## 2015-07-28 MED ORDER — POTASSIUM CHLORIDE CRYS ER 20 MEQ PO TBCR
20.0000 meq | EXTENDED_RELEASE_TABLET | Freq: Once | ORAL | Status: AC
  Administered 2015-07-28: 20 meq via ORAL
  Filled 2015-07-28: qty 1

## 2015-07-28 NOTE — Progress Notes (Signed)
Patient ID: [name], female   DOB: 03-21-1951, 64 y.o.   MRN: [id number] [facility] DAILY ICU PROGRESS NOTE                   [address]            [place] [address]          [phone]   3 Days Post-Op Procedure(s) (LRB): Right VIDEO ASSISTED THORACOSCOPY with Right lower lobe lobectomy and Insertion of ONQ pain pump (Right) LYMPH NODE DISSECTION (Right)  Total Length of Stay:  LOS: 3 days   Subjective: Feels well, good cough  Objective: Vital signs in last 24 hours: Temp:  [97.8 F (36.6 C)-98.6 F (37 C)] 98.1 F (36.7 C) (09/17 0753) Pulse Rate:  [59-86] 75 (09/17 0900) Cardiac Rhythm:  [-] Normal sinus rhythm (09/17 0900) Resp:  [15-30] 18 (09/17 0900) BP: (114-151)/(58-98) 114/98 mmHg (09/17 0800) SpO2:  [93 %-97 %] 96 % (09/17 0928)  Filed Weights   07/25/15 1839 07/26/15 0600 07/27/15 0500  Weight: 140 lb 14 oz (63.9 kg) 144 lb 2.9 oz (65.4 kg) 134 lb 4.2 oz (60.9 kg)    Weight change:    Hemodynamic parameters for last 24 hours:    Intake/Output from previous day: 09/16 0701 - 09/17 0700 In: 1380 [P.O.:840; I.V.:540] Out: 7939 [QZESP:2330; Chest Tube:287]  Intake/Output this shift: Total I/O In: 300 [P.O.:300] Out: 350 [Urine:350]  Current Meds: Scheduled Meds: . acetaminophen  1,000 mg Oral 4 times per day   Or  . acetaminophen (TYLENOL) oral liquid 160 mg/5 mL  1,000 mg Oral 4 times per day  . atorvastatin  20 mg Oral Daily  . bisacodyl  10 mg Oral Daily  . flecainide  25 mg Oral BID  . metoprolol  12.5 mg Oral BID  . pantoprazole  40 mg Oral Daily  . PARoxetine  10 mg Oral Daily  . senna-docusate  1 tablet Oral QHS  . tiotropium  18 mcg Inhalation Daily  . traZODone  50 mg Oral QHS   Continuous Infusions: . bupivacaine ON-Q pain pump     PRN Meds:.acetaminophen, hydrALAZINE, HYDROcodone-acetaminophen, levalbuterol, naloxone **AND** sodium chloride, ondansetron (ZOFRAN) IV, ondansetron (ZOFRAN) IV, potassium chloride,  simethicone  General appearance: alert and cooperative Neurologic: intact Heart: regular rate and rhythm, S1, S2 normal, no murmur, click, rub or gallop Lungs: clear to auscultation bilaterally Abdomen: soft, non-tender; bowel sounds normal; no masses,  no organomegaly Extremities: extremities normal, atraumatic, no cyanosis or edema and Homans sign is negative, no sign of DVT Wound: no air leak from tubes  Lab Results: CBC: Recent Labs  07/27/15 0400 07/28/15 0512  WBC 14.5* 10.8*  HGB 12.4 11.7*  HCT 35.7* 34.9*  PLT 181 185   BMET:  Recent Labs  07/27/15 0400 07/28/15 0512  NA 126* 130*  K 3.6 3.5  CL 90* 91*  CO2 28 32  GLUCOSE 115* 101*  BUN <5* 5*  CREATININE 0.54 0.52  CALCIUM 8.7* 8.7*    PT/INR: No results for input(s): LABPROT, INR in the last 72 hours. Radiology: Dg Chest Port 1 View  07/28/2015   CLINICAL DATA:  Status post VATS for lung cancer 3 days ago.  EXAM: PORTABLE CHEST - 1 VIEW  COMPARISON:  07/27/2015 and 07/26/2015  FINDINGS: 1642 hours. The inferior right chest tube has been removed in the interval. The second right-sided chest tube remains in place. Right subclavian Port-A-Cath appears unchanged. There is a minimal  residual right apical and basilar pneumothorax. There is stable mild atelectasis at both lung bases. The heart size and mediastinal contours are stable. Right-sided rib fractures/thoracotomy defects noted.  IMPRESSION: Minimal residual right-sided pneumothorax, unchanged. Stable bibasilar atelectasis.   Electronically Signed   By: Richardean Sale M.D.   On: 07/28/2015 09:14   Path back: stage I  Assessment/Plan: S/P Procedure(s) (LRB): Right VIDEO ASSISTED THORACOSCOPY with Right lower lobe lobectomy and Insertion of ONQ pain pump (Right) LYMPH NODE DISSECTION (Right) Mobilize Diuresis d/c tubes/lines Plan for transfer to step-down: see transfer orders To 2w    Grace Isaac 07/28/2015 10:14 AM

## 2015-07-29 ENCOUNTER — Inpatient Hospital Stay (HOSPITAL_COMMUNITY): Payer: Medicare Other

## 2015-07-29 MED ORDER — FLECAINIDE ACETATE 50 MG PO TABS: 25.0000 mg | ORAL_TABLET | Freq: Two times a day (BID) | ORAL | Status: DC

## 2015-07-29 MED ORDER — HYDROCODONE-ACETAMINOPHEN 7.5-325 MG PO TABS: 1.0000 | ORAL_TABLET | Freq: Four times a day (QID) | ORAL | Status: DC | PRN

## 2015-07-29 NOTE — Progress Notes (Addendum)
       [address]       [place] [address]             [phone]          4 Days Post-Op Procedure(s) (LRB): Right VIDEO ASSISTED THORACOSCOPY with Right lower lobe lobectomy and Insertion of ONQ pain pump (Right) LYMPH NODE DISSECTION (Right)  Subjective: Had a lot of soreness after CT out yesterday, but feels better today and wants to go home.  Objective: Vital signs in last 24 hours: Patient Vitals for the past 24 hrs:  BP Temp Temp src Pulse Resp SpO2  07/29/15 0906 - - - - - 92 %  07/29/15 0533 (!) 116/53 mmHg 97.8 F (36.6 C) Oral (!) 56 18 98 %  07/28/15 2158 (!) 128/56 mmHg - - 69 - -  07/28/15 2026 (!) 118/58 mmHg 98.1 F (36.7 C) Oral 80 18 92 %  07/28/15 1542 132/75 mmHg 98 F (36.7 C) Oral 76 18 94 %  07/28/15 1500 - - - - 19 -  07/28/15 1400 - - - 70 20 93 %  07/28/15 1300 - - - 65 14 93 %  07/28/15 1219 - 98.3 F (36.8 C) Oral - - -  07/28/15 1200 (!) 106/43 mmHg - - 65 (!) 21 96 %  07/28/15 1100 - - - 63 17 97 %  07/28/15 1000 - - - 72 18 97 %  07/28/15 0928 - - - - - 96 %   Current Weight  07/27/15 134 lb 4.2 oz (60.9 kg)     Intake/Output from previous day: 09/17 0701 - 09/18 0700 In: 600 [P.O.:600] Out: 1050 [Urine:1050]    PHYSICAL EXAM:  Heart: RRR Lungs: Slightly decreased in bases Wound: Clean and dry    Lab Results: CBC: Recent Labs  07/27/15 0400 07/28/15 0512  WBC 14.5* 10.8*  HGB 12.4 11.7*  HCT 35.7* 34.9*  PLT 181 185   BMET:  Recent Labs  07/27/15 0400 07/28/15 0512  NA 126* 130*  K 3.6 3.5  CL 90* 91*  CO2 28 32  GLUCOSE 115* 101*  BUN <5* 5*  CREATININE 0.54 0.52  CALCIUM 8.7* 8.7*    PT/INR: No results for input(s): LABPROT, INR in the last 72 hours.  CXR: stable, no ptx   Assessment/Plan: S/P Procedure(s) (LRB): Right VIDEO ASSISTED THORACOSCOPY with Right lower lobe lobectomy and Insertion of ONQ pain pump (Right) LYMPH NODE DISSECTION (Right) CXR stable following CT  removal.  Doing well overall. Plan d/c home today- instructions reviewed with patient.   LOS: 4 days    COLLINS,GINA H 07/29/2015  Doing well Plan d/c today I have seen and examined [name] and agree with the above assessment  and plan.  Grace Isaac MD Beeper [phone] Office [phone] 07/29/2015 11:54 AM

## 2015-07-29 NOTE — Progress Notes (Signed)
07/29/2015 1:08 AM The patient coughed up thick, yellow medium size sputum that was tinged with blood.  Patient breathing regular and unlabored.  Will continue to monitor the patient. Lupita Dawn, RN

## 2015-07-29 NOTE — Progress Notes (Signed)
07/29/2015 1300 Discharge AVS meds taken today and those due this evening reviewed.  Follow-up appointments and when to call md reviewed.  D/C IV and TELE.  Questions and concerns addressed.   D/C home per orders. Carney Corners

## 2015-07-31 ENCOUNTER — Telehealth: Payer: Self-pay | Admitting: Family Medicine

## 2015-07-31 NOTE — Telephone Encounter (Addendum)
Patient is asking about the testing she needs to have done to get her oxygen back, please advise?

## 2015-08-02 NOTE — Telephone Encounter (Signed)
Spoke with patient and she will voice concerns for oxygen with oncologist.  Overall she is doing well.

## 2015-08-03 ENCOUNTER — Other Ambulatory Visit: Payer: Self-pay | Admitting: *Deleted

## 2015-08-03 ENCOUNTER — Ambulatory Visit (INDEPENDENT_AMBULATORY_CARE_PROVIDER_SITE_OTHER): Payer: Self-pay | Admitting: *Deleted

## 2015-08-03 DIAGNOSIS — Z902 Acquired absence of lung [part of]: Secondary | ICD-10-CM

## 2015-08-03 DIAGNOSIS — Z4802 Encounter for removal of sutures: Secondary | ICD-10-CM

## 2015-08-03 DIAGNOSIS — G8918 Other acute postprocedural pain: Secondary | ICD-10-CM

## 2015-08-03 DIAGNOSIS — C3431 Malignant neoplasm of lower lobe, right bronchus or lung: Secondary | ICD-10-CM

## 2015-08-03 MED ORDER — OXYCODONE HCL 5 MG PO TABS: 5.0000 mg | ORAL_TABLET | ORAL | Status: DC | PRN

## 2015-08-03 MED ORDER — OXYCODONE HCL 5 MG PO TABS
5.0000 mg | ORAL_TABLET | ORAL | Status: DC | PRN
Start: 2015-08-03 — End: 2015-08-03

## 2015-08-03 NOTE — Progress Notes (Signed)
[name] returns for suture removal of previous chest tube site sutures s/p R VATS assisted RLLobectomy. All operative sites are very well healed.  She is having significant pain.  She was discharged on her regular Norco that was prescribed by her PCP because she felt that would be sufficient.  She is requesting something stronger. I contacted Dr. Prescott Gum and he signed a new Oxycodone script for her.  She will return as scheduled with a chest xray.

## 2015-08-06 DIAGNOSIS — G8929 Other chronic pain: Secondary | ICD-10-CM | POA: Diagnosis not present

## 2015-08-06 DIAGNOSIS — R260 Ataxic gait: Secondary | ICD-10-CM | POA: Diagnosis not present

## 2015-08-06 DIAGNOSIS — Z79899 Other long term (current) drug therapy: Secondary | ICD-10-CM | POA: Diagnosis not present

## 2015-08-06 DIAGNOSIS — M79609 Pain in unspecified limb: Secondary | ICD-10-CM | POA: Diagnosis not present

## 2015-08-06 DIAGNOSIS — C3491 Malignant neoplasm of unspecified part of right bronchus or lung: Secondary | ICD-10-CM | POA: Diagnosis not present

## 2015-08-06 DIAGNOSIS — M79606 Pain in leg, unspecified: Secondary | ICD-10-CM | POA: Diagnosis not present

## 2015-08-10 NOTE — Progress Notes (Signed)
Title of Study: PATHOLOGY PROCUREMENT  Description: Customer service manager for the Discovery and Validation of Biomarkers for the Prediction, Diagnosis and Management of Disease  Principal Investigator: Enid Cutter, MD  Study Coordinator: Rodena Goldmann  IRB #: [id number]  Met with Ms. Mcaleer for 15 minutes to review IRB# [id number]. 1 family members present. The patient is eligible and qualifies for this study. Reviewed the consent/HIPAA form in detail and explained the purpose of the study, study procedures, potential risks, potential benefits, and alternatives to participation. All of the patient's questions were answered. The patient agreed to take part in the study and signed the consent/HIPAA document. Enrollment procedures completed. The patient was given a copy of the signed informed consent. Consent form loaded in Media Tab (07/25/15 Procedure Note - [email or web address].pdf)  Leftover tissue attempted, but not obtained. Insufficient for research collection.   (14-Sept-2016)  [place]  Cell [phone]

## 2015-08-13 ENCOUNTER — Other Ambulatory Visit: Payer: Self-pay | Admitting: *Deleted

## 2015-08-13 DIAGNOSIS — G8918 Other acute postprocedural pain: Secondary | ICD-10-CM

## 2015-08-13 MED ORDER — HYDROCODONE-ACETAMINOPHEN 7.5-325 MG PO TABS: 1.0000 | ORAL_TABLET | Freq: Four times a day (QID) | ORAL | Status: DC | PRN

## 2015-08-13 NOTE — Telephone Encounter (Signed)
She would like a refill the pain med Norco 7.5/325.  I told her a new script would be available tomorrow at the office and she agreed.

## 2015-08-15 ENCOUNTER — Ambulatory Visit: Payer: Medicare Other | Admitting: Cardiothoracic Surgery

## 2015-08-16 ENCOUNTER — Encounter: Payer: Self-pay | Admitting: Internal Medicine

## 2015-08-16 ENCOUNTER — Telehealth: Payer: Self-pay | Admitting: Internal Medicine

## 2015-08-16 ENCOUNTER — Other Ambulatory Visit (HOSPITAL_BASED_OUTPATIENT_CLINIC_OR_DEPARTMENT_OTHER): Payer: Medicare Other

## 2015-08-16 ENCOUNTER — Ambulatory Visit (HOSPITAL_BASED_OUTPATIENT_CLINIC_OR_DEPARTMENT_OTHER): Payer: Medicare Other | Admitting: Internal Medicine

## 2015-08-16 VITALS — BP 141/73 | HR 98 | Temp 98.3°F | Resp 17 | Ht 62.99 in | Wt 139.7 lb

## 2015-08-16 DIAGNOSIS — C3431 Malignant neoplasm of lower lobe, right bronchus or lung: Secondary | ICD-10-CM | POA: Diagnosis not present

## 2015-08-16 DIAGNOSIS — C3491 Malignant neoplasm of unspecified part of right bronchus or lung: Secondary | ICD-10-CM

## 2015-08-16 LAB — CBC WITH DIFFERENTIAL/PLATELET
BASO%: 0.7 % (ref 0.0–2.0)
Basophils Absolute: 0.1 10*3/uL (ref 0.0–0.1)
EOS ABS: 0.7 10*3/uL — AB (ref 0.0–0.5)
EOS%: 7.6 % — AB (ref 0.0–7.0)
HCT: 36.9 % (ref 34.8–46.6)
HEMOGLOBIN: 12.3 g/dL (ref 11.6–15.9)
LYMPH%: 20.8 % (ref 14.0–49.7)
MCH: 30.4 pg (ref 25.1–34.0)
MCHC: 33.4 g/dL (ref 31.5–36.0)
MCV: 90.9 fL (ref 79.5–101.0)
MONO#: 0.8 10*3/uL (ref 0.1–0.9)
MONO%: 9.5 % (ref 0.0–14.0)
NEUT%: 61.4 % (ref 38.4–76.8)
NEUTROS ABS: 5.5 10*3/uL (ref 1.5–6.5)
Platelets: 341 10*3/uL (ref 145–400)
RBC: 4.06 10*6/uL (ref 3.70–5.45)
RDW: 13.2 % (ref 11.2–14.5)
WBC: 8.9 10*3/uL (ref 3.9–10.3)
lymph#: 1.9 10*3/uL (ref 0.9–3.3)

## 2015-08-16 LAB — COMPREHENSIVE METABOLIC PANEL (CC13)
ALT: 15 U/L (ref 0–55)
ANION GAP: 10 meq/L (ref 3–11)
AST: 14 U/L (ref 5–34)
Albumin: 3.4 g/dL — ABNORMAL LOW (ref 3.5–5.0)
Alkaline Phosphatase: 80 U/L (ref 40–150)
BUN: 10.1 mg/dL (ref 7.0–26.0)
CALCIUM: 9.6 mg/dL (ref 8.4–10.4)
CHLORIDE: 105 meq/L (ref 98–109)
CO2: 24 mEq/L (ref 22–29)
CREATININE: 0.7 mg/dL (ref 0.6–1.1)
Glucose: 101 mg/dl (ref 70–140)
Potassium: 3.9 mEq/L (ref 3.5–5.1)
Sodium: 140 mEq/L (ref 136–145)
Total Bilirubin: <0.3 mg/dL (ref 0.20–1.20)
Total Protein: 7.1 g/dL (ref 6.4–8.3)

## 2015-08-16 NOTE — Patient Instructions (Signed)
Smoking Cessation, Tips for Success If you are ready to quit smoking, congratulations! You have chosen to help yourself be healthier. Cigarettes bring nicotine, tar, carbon monoxide, and other irritants into your body. Your lungs, heart, and blood vessels will be able to work better without these poisons. There are many different ways to quit smoking. Nicotine gum, nicotine patches, a nicotine inhaler, or nicotine nasal spray can help with physical craving. Hypnosis, support groups, and medicines help break the habit of smoking. WHAT THINGS CAN I DO TO MAKE QUITTING EASIER?  Here are some tips to help you quit for good:  Pick a date when you will quit smoking completely. Tell all of your friends and family about your plan to quit on that date.  Do not try to slowly cut down on the number of cigarettes you are smoking. Pick a quit date and quit smoking completely starting on that day.  Throw away all cigarettes.   Clean and remove all ashtrays from your home, work, and car.  On a card, write down your reasons for quitting. Carry the card with you and read it when you get the urge to smoke.  Cleanse your body of nicotine. Drink enough water and fluids to keep your urine clear or pale yellow. Do this after quitting to flush the nicotine from your body.  Learn to predict your moods. Do not let a bad situation be your excuse to have a cigarette. Some situations in your life might tempt you into wanting a cigarette.  Never have "just one" cigarette. It leads to wanting another and another. Remind yourself of your decision to quit.  Change habits associated with smoking. If you smoked while driving or when feeling stressed, try other activities to replace smoking. Stand up when drinking your coffee. Brush your teeth after eating. Sit in a different chair when you read the paper. Avoid alcohol while trying to quit, and try to drink fewer caffeinated beverages. Alcohol and caffeine may urge you to  smoke.  Avoid foods and drinks that can trigger a desire to smoke, such as sugary or spicy foods and alcohol.  Ask people who smoke not to smoke around you.  Have something planned to do right after eating or having a cup of coffee. For example, plan to take a walk or exercise.  Try a relaxation exercise to calm you down and decrease your stress. Remember, you may be tense and nervous for the first 2 weeks after you quit, but this will pass.  Find new activities to keep your hands busy. Play with a pen, coin, or rubber band. Doodle or draw things on paper.  Brush your teeth right after eating. This will help cut down on the craving for the taste of tobacco after meals. You can also try mouthwash.   Use oral substitutes in place of cigarettes. Try using lemon drops, carrots, cinnamon sticks, or chewing gum. Keep them handy so they are available when you have the urge to smoke.  When you have the urge to smoke, try deep breathing.  Designate your home as a nonsmoking area.  If you are a heavy smoker, ask your health care provider about a prescription for nicotine chewing gum. It can ease your withdrawal from nicotine.  Reward yourself. Set aside the cigarette money you save and buy yourself something nice.  Look for support from others. Join a support group or smoking cessation program. Ask someone at home or at work to help you with your plan   to quit smoking.  Always ask yourself, "Do I need this cigarette or is this just a reflex?" Tell yourself, "Today, I choose not to smoke," or "I do not want to smoke." You are reminding yourself of your decision to quit.  Do not replace cigarette smoking with electronic cigarettes (commonly called e-cigarettes). The safety of e-cigarettes is unknown, and some may contain harmful chemicals.  If you relapse, do not give up! Plan ahead and think about what you will do the next time you get the urge to smoke. HOW WILL I FEEL WHEN I QUIT SMOKING? You  may have symptoms of withdrawal because your body is used to nicotine (the addictive substance in cigarettes). You may crave cigarettes, be irritable, feel very hungry, cough often, get headaches, or have difficulty concentrating. The withdrawal symptoms are only temporary. They are strongest when you first quit but will go away within 10-14 days. When withdrawal symptoms occur, stay in control. Think about your reasons for quitting. Remind yourself that these are signs that your body is healing and getting used to being without cigarettes. Remember that withdrawal symptoms are easier to treat than the major diseases that smoking can cause.  Even after the withdrawal is over, expect periodic urges to smoke. However, these cravings are generally short lived and will go away whether you smoke or not. Do not smoke! WHAT RESOURCES ARE AVAILABLE TO HELP ME QUIT SMOKING? Your health care provider can direct you to community resources or hospitals for support, which may include:  Group support.  Education.  Hypnosis.  Therapy.   This information is not intended to replace advice given to you by your health care provider. Make sure you discuss any questions you have with your health care provider.   Document Released: 07/25/2004 Document Revised: 11/17/2014 Document Reviewed: 04/14/2013 [employer] Interactive Patient Education 2016 [employer]  
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
## 2015-08-16 NOTE — Telephone Encounter (Signed)
Gave patient avs report and appointments for April 2017. Central will call patient with ct - patient aware.

## 2015-08-17 ENCOUNTER — Other Ambulatory Visit: Payer: Self-pay | Admitting: Cardiothoracic Surgery

## 2015-08-17 DIAGNOSIS — C3490 Malignant neoplasm of unspecified part of unspecified bronchus or lung: Secondary | ICD-10-CM

## 2015-08-20 ENCOUNTER — Telehealth: Payer: Self-pay | Admitting: *Deleted

## 2015-08-20 ENCOUNTER — Other Ambulatory Visit: Payer: Self-pay

## 2015-08-20 DIAGNOSIS — C3491 Malignant neoplasm of unspecified part of right bronchus or lung: Secondary | ICD-10-CM

## 2015-08-20 MED ORDER — SPIRIVA RESPIMAT 2.5 MCG/ACT IN AERS: 1.0000 | INHALATION_SPRAY | Freq: Every day | RESPIRATORY_TRACT | Status: DC

## 2015-08-20 MED ORDER — CYCLOBENZAPRINE HCL 10 MG PO TABS: 10.0000 mg | ORAL_TABLET | Freq: Three times a day (TID) | ORAL | Status: DC | PRN

## 2015-08-20 MED ORDER — ATORVASTATIN CALCIUM 20 MG PO TABS: 20.0000 mg | ORAL_TABLET | Freq: Every day | ORAL | Status: DC

## 2015-08-20 NOTE — Telephone Encounter (Signed)
Oncology Nurse Navigator Documentation  Oncology Nurse Navigator Flowsheets 08/20/2015  Navigator Encounter Type Telephone/called to check up on patient and to see how her smoking cessation was going.  I left vm message to call if needed  Patient Visit Type Follow-up  Treatment Phase Other  Barriers/Navigation Needs Education  Education Smoking cessation  Time Spent with Patient 15

## 2015-08-22 ENCOUNTER — Ambulatory Visit (INDEPENDENT_AMBULATORY_CARE_PROVIDER_SITE_OTHER): Payer: Self-pay | Admitting: Cardiothoracic Surgery

## 2015-08-22 ENCOUNTER — Encounter: Payer: Self-pay | Admitting: Cardiothoracic Surgery

## 2015-08-22 ENCOUNTER — Ambulatory Visit
Admission: RE | Admit: 2015-08-22 | Discharge: 2015-08-22 | Disposition: A | Discharge status: Home / Self Care | Payer: Medicare Other | Source: Ambulatory Visit | Attending: Cardiothoracic Surgery | Admitting: Cardiothoracic Surgery

## 2015-08-22 VITALS — BP 124/70 | HR 86 | Resp 20 | Ht 62.0 in | Wt 143.0 lb

## 2015-08-22 DIAGNOSIS — J90 Pleural effusion, not elsewhere classified: Secondary | ICD-10-CM | POA: Diagnosis not present

## 2015-08-22 DIAGNOSIS — C3490 Malignant neoplasm of unspecified part of unspecified bronchus or lung: Secondary | ICD-10-CM

## 2015-08-22 DIAGNOSIS — C801 Malignant (primary) neoplasm, unspecified: Secondary | ICD-10-CM

## 2015-08-22 DIAGNOSIS — Z902 Acquired absence of lung [part of]: Secondary | ICD-10-CM

## 2015-08-22 DIAGNOSIS — Z23 Encounter for immunization: Secondary | ICD-10-CM | POA: Diagnosis not present

## 2015-08-22 DIAGNOSIS — IMO0002 Reserved for concepts with insufficient information to code with codable children: Secondary | ICD-10-CM
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
## 2015-09-05 ENCOUNTER — Encounter: Payer: Self-pay | Admitting: Family Medicine

## 2015-09-05 ENCOUNTER — Ambulatory Visit (INDEPENDENT_AMBULATORY_CARE_PROVIDER_SITE_OTHER): Payer: Medicare Other | Admitting: Family Medicine

## 2015-09-05 VITALS — BP 126/60 | HR 72 | Resp 18 | Ht 62.0 in | Wt 140.1 lb

## 2015-09-05 DIAGNOSIS — R260 Ataxic gait: Secondary | ICD-10-CM | POA: Diagnosis not present

## 2015-09-05 DIAGNOSIS — C3491 Malignant neoplasm of unspecified part of right bronchus or lung: Secondary | ICD-10-CM

## 2015-09-05 DIAGNOSIS — M25511 Pain in right shoulder: Secondary | ICD-10-CM

## 2015-09-05 DIAGNOSIS — I10 Essential (primary) hypertension: Secondary | ICD-10-CM | POA: Diagnosis not present

## 2015-09-05 DIAGNOSIS — G4733 Obstructive sleep apnea (adult) (pediatric): Secondary | ICD-10-CM

## 2015-09-05 DIAGNOSIS — Z79899 Other long term (current) drug therapy: Secondary | ICD-10-CM | POA: Diagnosis not present

## 2015-09-05 DIAGNOSIS — Z720 Tobacco use: Secondary | ICD-10-CM | POA: Diagnosis not present

## 2015-09-05 DIAGNOSIS — M79609 Pain in unspecified limb: Secondary | ICD-10-CM | POA: Diagnosis not present

## 2015-09-05 NOTE — Progress Notes (Signed)
   Subjective:    Patient ID: [name], female    DOB: 11-Sep-1951, 64 y.o.   MRN: [id number]  HPI   [name]     MRN: [id number]      DOB: Dec 15, 1950   HPI [name] is here for follow up and re-evaluation of chronic medical conditions, medication management and review of any available recent lab and radiology data.  Preventive health is updated, specifically  Cancer screening and Immunization.   Has had successful  Surgery for localized lung cancer . The PT denies any adverse reactions to current medications since the last visit.  C/o increased and uncontrolled right shoulder pain with limited mobility   ROS Denies recent fever or chills. Denies sinus pressure, nasal congestion, ear pain or sore throat. Denies chest congestion, productive cough or wheezing. Denies chest pains, palpitations and leg swelling Denies abdominal pain, nausea, vomiting,diarrhea or constipation.   Denies dysuria, frequency, hesitancy or incontinence.  Denies headaches, seizures, numbness, or tingling. Denies uncontrolled depression, does have anxiety or insomnia. Denies skin break down or rash.   PE  BP 126/60 mmHg  Pulse 72  Resp 18  Ht '5\' 2"'$  (1.575 m)  Wt 140 lb 1.3 oz (63.54 kg)  BMI 25.61 kg/m2  SpO2 98%  Patient alert and oriented and in no cardiopulmonary distress.  HEENT: No facial asymmetry, EOMI,   oropharynx pink and moist.  Neck supple no JVD, no mass.  Chest: Clear to auscultation bilaterally.decreased air entry on right side  CVS: S1, S2 no murmurs, no S3.Regular rate.  ABD: Soft non tender.   Ext: No edema  MS: Adequate ROM spine,hips and knees, markedly reduced in right shoulder  Skin: Intact, no ulcerations or rash noted.Healed surgical scar posterior chestr Psych: Good eye contact, normal affect. Memory intact not anxious or depressed appearing.  CNS: CN 2-12 intact, power,  normal throughout.no focal deficits noted.   Assessment & Plan   Tobacco  abuse Patient counseled for approximately 5 minutes regarding the health risks of ongoing nicotine use, specifically all types of cancer, heart disease, stroke and respiratory failure. The options available for help with cessation ,the behavioral changes to assist the process, and the option to either gradully reduce usage  Or abruptly stop.is also discussed. Pt is also encouraged to set specific goals in number of cigarettes used daily, as well as to set a quit date.  Number of cigarettes/cigars currently smoking daily: 10 to 15 wants to quit   Essential hypertension DASH diet and commitment to daily physical activity for a minimum of 30 minutes discussed and encouraged, as a part of hypertension management. The importance of attaining a healthy weight is also discussed.  BP/Weight 09/05/2015 08/22/2015 08/16/2015 07/29/2015 07/27/2015 2/54/2706 12/13/7626  Systolic BP 315 176 160 737 - - 106  Diastolic BP 60 70 73 45 - - 67  Wt. (Lbs) 140.08 143 139.7 - 134.26 - 141.3  BMI 25.61 26.15 24.75 - - 23.79 25.04   Controlled, no change in medication     OSA (obstructive sleep apnea) Re establishing with local Provider so that she cam obtain necessary equipment  Squamous cell lung cancer Under oncology surveillance following right lobectomy in past 3 months, stable and doing well  Shoulder pain, right debilitating with reduced function, ortho eval and treat       Review of Systems     Objective:   Physical Exam        Assessment & Plan:

## 2015-09-05 NOTE — Patient Instructions (Addendum)
Annual wellness in 3.5  month, call if you need me sooner  QUIT date for cigarettes is December 15, DO NOT use cigarettes with nicotrol inhaler  You are referred to Dr Aline Brochure re right shoulder pain  Thanks for choosing [facility] we consider it a privelige to serve you. All the best!

## 2015-09-26 DIAGNOSIS — M79609 Pain in unspecified limb: Secondary | ICD-10-CM | POA: Diagnosis not present

## 2015-09-26 DIAGNOSIS — F411 Generalized anxiety disorder: Secondary | ICD-10-CM | POA: Diagnosis not present

## 2015-09-26 DIAGNOSIS — I10 Essential (primary) hypertension: Secondary | ICD-10-CM | POA: Diagnosis not present

## 2015-09-26 DIAGNOSIS — Z79899 Other long term (current) drug therapy: Secondary | ICD-10-CM | POA: Diagnosis not present

## 2015-09-26 DIAGNOSIS — M1990 Unspecified osteoarthritis, unspecified site: Secondary | ICD-10-CM | POA: Diagnosis not present

## 2015-09-26 DIAGNOSIS — R260 Ataxic gait: Secondary | ICD-10-CM | POA: Diagnosis not present

## 2015-09-26 DIAGNOSIS — M62838 Other muscle spasm: Secondary | ICD-10-CM | POA: Diagnosis not present

## 2015-09-26 DIAGNOSIS — M79673 Pain in unspecified foot: Secondary | ICD-10-CM | POA: Diagnosis not present

## 2015-09-26 DIAGNOSIS — K219 Gastro-esophageal reflux disease without esophagitis: Secondary | ICD-10-CM | POA: Diagnosis not present

## 2015-09-26 DIAGNOSIS — H9190 Unspecified hearing loss, unspecified ear: Secondary | ICD-10-CM | POA: Diagnosis not present

## 2015-09-26 DIAGNOSIS — C3491 Malignant neoplasm of unspecified part of right bronchus or lung: Secondary | ICD-10-CM | POA: Diagnosis not present

## 2015-09-27 DIAGNOSIS — J449 Chronic obstructive pulmonary disease, unspecified: Secondary | ICD-10-CM | POA: Diagnosis not present

## 2015-09-30 NOTE — Assessment & Plan Note (Signed)
DASH diet and commitment to daily physical activity for a minimum of 30 minutes discussed and encouraged, as a part of hypertension management. The importance of attaining a healthy weight is also discussed.  BP/Weight 09/05/2015 08/22/2015 08/16/2015 07/29/2015 07/27/2015 4/98/2641 03/17/3093  Systolic BP 076 808 811 031 - - 594  Diastolic BP 60 70 73 45 - - 67  Wt. (Lbs) 140.08 143 139.7 - 134.26 - 141.3  BMI 25.61 26.15 24.75 - - 23.79 25.04   Controlled, no change in medication

## 2015-09-30 NOTE — Assessment & Plan Note (Signed)
Re establishing with local Provider so that she cam obtain necessary equipment

## 2015-09-30 NOTE — Assessment & Plan Note (Signed)

## 2015-09-30 NOTE — Assessment & Plan Note (Signed)
Under oncology surveillance following right lobectomy in past 3 months, stable and doing well

## 2015-09-30 NOTE — Assessment & Plan Note (Signed)
debilitating with reduced function, ortho eval and treat

## 2015-10-01 ENCOUNTER — Ambulatory Visit (INDEPENDENT_AMBULATORY_CARE_PROVIDER_SITE_OTHER): Payer: Medicare Other

## 2015-10-01 ENCOUNTER — Ambulatory Visit (INDEPENDENT_AMBULATORY_CARE_PROVIDER_SITE_OTHER): Payer: Medicare Other | Admitting: Orthopedic Surgery

## 2015-10-01 VITALS — BP 141/66 | Ht 62.0 in | Wt 140.0 lb

## 2015-10-01 DIAGNOSIS — M7551 Bursitis of right shoulder: Secondary | ICD-10-CM

## 2015-10-01 DIAGNOSIS — M25511 Pain in right shoulder: Secondary | ICD-10-CM

## 2015-10-01 MED ORDER — DICLOFENAC SODIUM 1 % TD GEL: 4.0000 g | Freq: Four times a day (QID) | TRANSDERMAL | Status: DC
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
## 2015-10-01 NOTE — Patient Instructions (Signed)
Call [facility] therapy dept to schedule therapy  New medication sent to your pharmacy

## 2015-10-15 ENCOUNTER — Encounter (HOSPITAL_COMMUNITY): Payer: Self-pay

## 2015-10-15 ENCOUNTER — Ambulatory Visit (HOSPITAL_COMMUNITY): Payer: Medicare Other | Attending: Orthopedic Surgery

## 2015-10-15 DIAGNOSIS — M7551 Bursitis of right shoulder: Secondary | ICD-10-CM | POA: Diagnosis not present

## 2015-10-15 DIAGNOSIS — M25511 Pain in right shoulder: Secondary | ICD-10-CM | POA: Insufficient documentation

## 2015-10-15 DIAGNOSIS — R29898 Other symptoms and signs involving the musculoskeletal system: Secondary | ICD-10-CM | POA: Insufficient documentation

## 2015-10-15 DIAGNOSIS — M629 Disorder of muscle, unspecified: Secondary | ICD-10-CM | POA: Insufficient documentation

## 2015-10-15 DIAGNOSIS — M6289 Other specified disorders of muscle: Secondary | ICD-10-CM

## 2015-10-15 NOTE — Patient Instructions (Signed)
  Flexibility: Corner Stretch   Standing in corner with hands just above shoulder level and feet __18__ inches from corner, lean forward until a comfortable stretch is felt across chest. Hold _10___ seconds. Repeat __3__ times per set. Do ____ sets per session. Do ____ sessions per day.  http://orth.exer.us/342   Copyright  VHI. All rights reserved.   Scapular Retraction (Standing)   With arms at sides, pinch shoulder blades together. Repeat _10___ times per set. Do ____ sets per session. Do ____ sessions per day.  Internal Rotation Across Back  Grab the end of a towel with your affected side, palm facing backwards. Grab the towel with your unaffected side and pull your affected hand across your back until you feel a stretch in the front of your shoulder. If you feel pain, pull just to the pain, do not pull through the pain. Hold. Return your affected arm to your side. Try to keep your hand/arm close to your body during the entire movement.  10 second hold. Repeat 3 times.    Posterior Capsule Stretch   Stand or sit, one arm across body so hand rests over opposite shoulder. Gently push on crossed elbow with other hand until stretch is felt in shoulder of crossed arm. Hold _10__ seconds.  Repeat _3__ times per session. Do ___ sessions per day.  Copyright  VHI. All rights reserved.   Flexors Stretch, Standing   Stand near wall and slide arm up, with palm facing away from wall, by leaning toward wall. Hold _10__ seconds.  Repeat _3__ times per session. Do ___ sessions per day.  Copyright  VHI. All rights reserved.
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
## 2015-10-22 ENCOUNTER — Ambulatory Visit (HOSPITAL_COMMUNITY): Payer: Medicare Other | Admitting: Specialist

## 2015-10-22 DIAGNOSIS — M629 Disorder of muscle, unspecified: Secondary | ICD-10-CM

## 2015-10-22 DIAGNOSIS — M7551 Bursitis of right shoulder: Secondary | ICD-10-CM

## 2015-10-22 DIAGNOSIS — M25511 Pain in right shoulder: Secondary | ICD-10-CM | POA: Diagnosis not present

## 2015-10-22 DIAGNOSIS — R29898 Other symptoms and signs involving the musculoskeletal system: Secondary | ICD-10-CM | POA: Diagnosis not present

## 2015-10-22 DIAGNOSIS — M6289 Other specified disorders of muscle: Secondary | ICD-10-CM
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
## 2015-10-24 ENCOUNTER — Other Ambulatory Visit: Payer: Self-pay | Admitting: Family Medicine

## 2015-10-24 ENCOUNTER — Encounter (HOSPITAL_COMMUNITY): Payer: Medicare Other | Admitting: Specialist

## 2015-10-24 DIAGNOSIS — G4733 Obstructive sleep apnea (adult) (pediatric): Secondary | ICD-10-CM | POA: Diagnosis not present

## 2015-10-24 DIAGNOSIS — M1990 Unspecified osteoarthritis, unspecified site: Secondary | ICD-10-CM | POA: Diagnosis not present

## 2015-10-24 DIAGNOSIS — M79673 Pain in unspecified foot: Secondary | ICD-10-CM | POA: Diagnosis not present

## 2015-10-24 DIAGNOSIS — G8922 Chronic post-thoracotomy pain: Secondary | ICD-10-CM | POA: Diagnosis not present

## 2015-10-24 DIAGNOSIS — Z79899 Other long term (current) drug therapy: Secondary | ICD-10-CM | POA: Diagnosis not present

## 2015-10-24 DIAGNOSIS — R260 Ataxic gait: Secondary | ICD-10-CM | POA: Diagnosis not present

## 2015-10-24 DIAGNOSIS — J9611 Chronic respiratory failure with hypoxia: Secondary | ICD-10-CM | POA: Diagnosis not present

## 2015-10-29 ENCOUNTER — Ambulatory Visit (HOSPITAL_COMMUNITY): Payer: Medicare Other | Admitting: Occupational Therapy

## 2015-10-29 ENCOUNTER — Encounter (HOSPITAL_COMMUNITY): Payer: Self-pay | Admitting: Occupational Therapy

## 2015-10-29 DIAGNOSIS — R29898 Other symptoms and signs involving the musculoskeletal system: Secondary | ICD-10-CM | POA: Diagnosis not present

## 2015-10-29 DIAGNOSIS — M629 Disorder of muscle, unspecified: Secondary | ICD-10-CM | POA: Diagnosis not present

## 2015-10-29 DIAGNOSIS — M25511 Pain in right shoulder: Secondary | ICD-10-CM

## 2015-10-29 DIAGNOSIS — M7551 Bursitis of right shoulder: Secondary | ICD-10-CM | POA: Diagnosis not present

## 2015-10-29 DIAGNOSIS — M6289 Other specified disorders of muscle: Secondary | ICD-10-CM
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
## 2015-10-29 NOTE — Patient Instructions (Addendum)
1) Shoulder Protraction    Begin with elbows by your side, slowly "punch" straight out in front of you keeping arms/elbows straight. Repeat _10-15__times, _2___set/day.     2) Shoulder Flexion  Supine:     Standing:         Begin with arms at your side with thumbs pointed up, slowly raise both arms up and forward towards overhead. Repeat_10-15__times, _2__set/day.               3) Horizontal abduction/adduction  Supine:   Standing:           Begin with arms straight out in front of you, bring out to the side in at "T" shape. Keep arms straight entire time. Repeat _10-15___times, __2__sets/day.                 4) Internal & External Rotation    *No band* -Stand with elbows at the side and elbows bent 90 degrees. Move your forearms away from your body, then bring back inward toward the body.  Repeat _10-15__times, _2__sets/day    5) Shoulder Abduction  Supine:     Standing:       Lying on your back begin with your arms flat on the table next to your side. Slowly move your arms out to the side so that they go overhead, in a jumping jack or snow angel movement. Repeat _10-15__times, _2__sets/day        (Home) Extension: Isometric / Bilateral Arm Retraction - Sitting   Facing anchor, hold hands and elbow at shoulder height, with elbow bent.  Pull arms back to squeeze shoulder blades together. Repeat 10-15 times.  Copyright  VHI. All rights reserved.   (Home) Retraction: Row - Bilateral (Anchor)   Facing anchor, arms reaching forward, pull hands toward stomach, keeping elbows bent and at your sides and pinching shoulder blades together. Repeat 10-15 times.  Copyright  VHI. All rights reserved.   (Clinic) Extension / Flexion (Assist)   Face anchor, pull arms back, keeping elbow straight, and squeze shoulder blades together. Repeat 10-15 times.   Copyright  VHI. All rights reserved.

## 2015-10-30 ENCOUNTER — Other Ambulatory Visit: Payer: Self-pay | Admitting: Family Medicine

## 2015-10-31 ENCOUNTER — Encounter (HOSPITAL_COMMUNITY): Payer: Medicare Other | Admitting: Specialist

## 2015-11-02 ENCOUNTER — Encounter (HOSPITAL_COMMUNITY): Payer: Medicare Other

## 2015-11-05 ENCOUNTER — Other Ambulatory Visit: Payer: Self-pay | Admitting: Family Medicine

## 2015-11-07 ENCOUNTER — Encounter (HOSPITAL_COMMUNITY): Payer: Medicare Other

## 2015-11-09 ENCOUNTER — Encounter (HOSPITAL_COMMUNITY): Payer: Medicare Other | Admitting: Occupational Therapy

## 2015-11-14 ENCOUNTER — Encounter (HOSPITAL_COMMUNITY): Payer: Medicare Other

## 2015-11-16 ENCOUNTER — Encounter (HOSPITAL_COMMUNITY): Payer: Medicare Other

## 2015-11-21 ENCOUNTER — Encounter (HOSPITAL_COMMUNITY): Payer: Medicare Other

## 2015-11-21 DIAGNOSIS — H9190 Unspecified hearing loss, unspecified ear: Secondary | ICD-10-CM | POA: Diagnosis not present

## 2015-11-21 DIAGNOSIS — B182 Chronic viral hepatitis C: Secondary | ICD-10-CM | POA: Diagnosis not present

## 2015-11-21 DIAGNOSIS — E7800 Pure hypercholesterolemia, unspecified: Secondary | ICD-10-CM | POA: Diagnosis not present

## 2015-11-21 DIAGNOSIS — R260 Ataxic gait: Secondary | ICD-10-CM | POA: Diagnosis not present

## 2015-11-21 DIAGNOSIS — M1990 Unspecified osteoarthritis, unspecified site: Secondary | ICD-10-CM | POA: Diagnosis not present

## 2015-11-21 DIAGNOSIS — C3491 Malignant neoplasm of unspecified part of right bronchus or lung: Secondary | ICD-10-CM | POA: Diagnosis not present

## 2015-11-21 DIAGNOSIS — M79609 Pain in unspecified limb: Secondary | ICD-10-CM | POA: Diagnosis not present

## 2015-11-21 DIAGNOSIS — M79673 Pain in unspecified foot: Secondary | ICD-10-CM | POA: Diagnosis not present

## 2015-11-21 DIAGNOSIS — Z79899 Other long term (current) drug therapy: Secondary | ICD-10-CM | POA: Diagnosis not present

## 2015-11-23 ENCOUNTER — Other Ambulatory Visit: Payer: Self-pay

## 2015-11-23 ENCOUNTER — Encounter (HOSPITAL_COMMUNITY): Payer: Medicare Other

## 2015-11-23 ENCOUNTER — Telehealth: Payer: Self-pay | Admitting: Family Medicine

## 2015-11-23 DIAGNOSIS — C3491 Malignant neoplasm of unspecified part of right bronchus or lung: Secondary | ICD-10-CM

## 2015-11-23 MED ORDER — CETIRIZINE HCL 10 MG PO TABS: 10.0000 mg | ORAL_TABLET | Freq: Every day | ORAL | Status: DC

## 2015-11-23 NOTE — Telephone Encounter (Signed)
Patient is asking for a refill oncetirizine (ZYRTEC) 10 MG tablet please advise?

## 2015-11-23 NOTE — Telephone Encounter (Signed)
Medication refilled as requested.

## 2015-11-28 ENCOUNTER — Encounter (HOSPITAL_COMMUNITY): Payer: Medicare Other

## 2015-11-30 ENCOUNTER — Encounter (HOSPITAL_COMMUNITY): Payer: Medicare Other

## 2015-12-05 ENCOUNTER — Encounter (HOSPITAL_COMMUNITY): Payer: Medicare Other | Admitting: Occupational Therapy

## 2015-12-07 ENCOUNTER — Encounter (HOSPITAL_COMMUNITY): Payer: Medicare Other

## 2015-12-12 ENCOUNTER — Encounter: Payer: Self-pay | Admitting: Family Medicine

## 2015-12-12 ENCOUNTER — Ambulatory Visit (INDEPENDENT_AMBULATORY_CARE_PROVIDER_SITE_OTHER): Payer: Medicare Other | Admitting: Family Medicine

## 2015-12-12 VITALS — BP 108/60 | HR 68 | Resp 18 | Ht 62.0 in | Wt 143.0 lb

## 2015-12-12 DIAGNOSIS — Z604 Social exclusion and rejection: Secondary | ICD-10-CM

## 2015-12-12 DIAGNOSIS — Z0000 Encounter for general adult medical examination without abnormal findings: Secondary | ICD-10-CM | POA: Diagnosis not present

## 2015-12-12 DIAGNOSIS — H547 Unspecified visual loss: Secondary | ICD-10-CM

## 2015-12-12 DIAGNOSIS — L659 Nonscarring hair loss, unspecified: Secondary | ICD-10-CM

## 2015-12-12 DIAGNOSIS — F17200 Nicotine dependence, unspecified, uncomplicated: Secondary | ICD-10-CM | POA: Diagnosis not present

## 2015-12-12 DIAGNOSIS — C3491 Malignant neoplasm of unspecified part of right bronchus or lung: Secondary | ICD-10-CM

## 2015-12-12 DIAGNOSIS — I471 Supraventricular tachycardia: Secondary | ICD-10-CM

## 2015-12-12 DIAGNOSIS — I10 Essential (primary) hypertension: Secondary | ICD-10-CM

## 2015-12-12 DIAGNOSIS — F411 Generalized anxiety disorder: Secondary | ICD-10-CM | POA: Insufficient documentation

## 2015-12-12 DIAGNOSIS — R7301 Impaired fasting glucose: Secondary | ICD-10-CM

## 2015-12-12 MED ORDER — POLYETHYLENE GLYCOL 3350 17 GM/SCOOP PO POWD: ORAL | Status: DC

## 2015-12-12 NOTE — Assessment & Plan Note (Signed)
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
## 2015-12-12 NOTE — Assessment & Plan Note (Signed)

## 2015-12-12 NOTE — Patient Instructions (Addendum)
F/u in August , call if you need me before  You are referred to dermatology  Paxil and that group of drugs potentially interacts with heart medication so is not prescribed  I will see when you next need to see cardiology and refer  You  We will see if social worker will be able to help  Memory is good, keep reading and try to get more involved with community, we will see if we are able to help  Please work on quitting nicotine  You are referred to eye Doc

## 2015-12-16 DIAGNOSIS — R52 Pain, unspecified: Secondary | ICD-10-CM | POA: Insufficient documentation

## 2015-12-16 DIAGNOSIS — H547 Unspecified visual loss: Secondary | ICD-10-CM | POA: Insufficient documentation

## 2015-12-16 DIAGNOSIS — H539 Unspecified visual disturbance: Secondary | ICD-10-CM | POA: Insufficient documentation

## 2015-12-16 DIAGNOSIS — Z604 Social exclusion and rejection: Secondary | ICD-10-CM | POA: Insufficient documentation

## 2015-12-16 NOTE — Assessment & Plan Note (Signed)
H/o recurrent SVT . S/p ablation in 10/2013, needs to re establish with cardiology. Was last being followed in [facility] in [facility]
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
## 2015-12-16 NOTE — Assessment & Plan Note (Signed)
Approx 15 month h/o ongoing hair loss and thinning which started when she moved to [place]. States she had a very difficult time there, and now that she is back in [place] , has had new challenge of treatment for lung cancer, and is extremely socially isolated. States her ongoing thinning of the hair is a source of depression for her, her med list is short and she has no new medications, becomes tearful as she discusses the situation. She is referred to dermatology for eval and management

## 2015-12-16 NOTE — Assessment & Plan Note (Signed)
Abnormal screen with c/o poor vision, will refer to opthalmology for management

## 2015-12-16 NOTE — Assessment & Plan Note (Signed)
Annual exam as documented. Counseling done  re healthy lifestyle involving commitment to 150 minutes exercise per week, heart healthy diet, and attaining healthy weight.The importance of adequate sleep also discussed. Changes in health habits are decided on by the patient with goals and time frames  set for achieving them. Immunization and cancer screening needs are specifically addressed at this visit. 

## 2015-12-17 ENCOUNTER — Telehealth: Payer: Self-pay | Admitting: Family Medicine

## 2015-12-17 DIAGNOSIS — M1990 Unspecified osteoarthritis, unspecified site: Secondary | ICD-10-CM | POA: Diagnosis not present

## 2015-12-17 DIAGNOSIS — Z79899 Other long term (current) drug therapy: Secondary | ICD-10-CM | POA: Diagnosis not present

## 2015-12-17 DIAGNOSIS — M79673 Pain in unspecified foot: Secondary | ICD-10-CM | POA: Diagnosis not present

## 2015-12-17 DIAGNOSIS — G8922 Chronic post-thoracotomy pain: Secondary | ICD-10-CM | POA: Diagnosis not present

## 2015-12-17 DIAGNOSIS — G4733 Obstructive sleep apnea (adult) (pediatric): Secondary | ICD-10-CM | POA: Diagnosis not present

## 2015-12-17 DIAGNOSIS — F411 Generalized anxiety disorder: Secondary | ICD-10-CM | POA: Diagnosis not present

## 2015-12-17 DIAGNOSIS — M62838 Other muscle spasm: Secondary | ICD-10-CM | POA: Diagnosis not present

## 2015-12-17 DIAGNOSIS — C3491 Malignant neoplasm of unspecified part of right bronchus or lung: Secondary | ICD-10-CM | POA: Diagnosis not present

## 2015-12-17 NOTE — Telephone Encounter (Signed)
Patient states that she went to the pain clinic this morning her blood pressure was running really low, they checked 3 different times and all 3 times it was low

## 2015-12-18 ENCOUNTER — Other Ambulatory Visit: Payer: Self-pay | Admitting: Family Medicine

## 2015-12-18 NOTE — Telephone Encounter (Signed)
Patient states that she has been checking at home since and it has been normal.  She is aware of the symptoms that she should watch for and will go to the ED if these occur (dizziness, nausea, chest pain).  She has a cardiology followup in 2 wks.  She will call back with any further problems.

## 2016-01-01 DIAGNOSIS — I480 Paroxysmal atrial fibrillation: Secondary | ICD-10-CM | POA: Diagnosis not present

## 2016-01-01 DIAGNOSIS — I10 Essential (primary) hypertension: Secondary | ICD-10-CM | POA: Diagnosis not present

## 2016-01-01 DIAGNOSIS — E782 Mixed hyperlipidemia: Secondary | ICD-10-CM | POA: Diagnosis not present

## 2016-01-01 DIAGNOSIS — I2510 Atherosclerotic heart disease of native coronary artery without angina pectoris: Secondary | ICD-10-CM | POA: Diagnosis not present

## 2016-01-01 DIAGNOSIS — L658 Other specified nonscarring hair loss: Secondary | ICD-10-CM | POA: Diagnosis not present

## 2016-01-15 DIAGNOSIS — H2511 Age-related nuclear cataract, right eye: Secondary | ICD-10-CM | POA: Diagnosis not present

## 2016-01-15 DIAGNOSIS — H2513 Age-related nuclear cataract, bilateral: Secondary | ICD-10-CM | POA: Diagnosis not present

## 2016-01-17 ENCOUNTER — Other Ambulatory Visit: Payer: Self-pay

## 2016-01-17 MED ORDER — TRAZODONE HCL 50 MG PO TABS: 50.0000 mg | ORAL_TABLET | Freq: Every day | ORAL | Status: DC

## 2016-01-17 MED ORDER — CYCLOBENZAPRINE HCL 10 MG PO TABS: ORAL_TABLET | ORAL | Status: DC

## 2016-01-23 ENCOUNTER — Telehealth: Payer: Self-pay | Admitting: Family Medicine

## 2016-01-23 NOTE — Telephone Encounter (Signed)
Patient is calling stating that she is having problems urinating with pain

## 2016-01-24 NOTE — Telephone Encounter (Signed)
Coming in Monday for uti check due to having no transport until then

## 2016-01-25 NOTE — Patient Instructions (Signed)
Your procedure is scheduled on: 02/05/2016  Report to [facility] at  78   AM.  Call this number if you have problems the morning of surgery: [phone]   Do not eat food or drink liquids :After Midnight.      Take these medicines the morning of surgery with A SIP OF WATER: zyrtec, tambocor, hydrocodone, lyrica, metoprolol, zanaflex. Take your inhaler before you come and bring it with you.   Do not wear jewelry, make-up or nail polish.  Do not wear lotions, powders, or perfumes. You may wear deodorant.  Do not shave 48 hours prior to surgery.  Do not bring valuables to the hospital.  Contacts, dentures or bridgework may not be worn into surgery.  Leave suitcase in the car. After surgery it may be brought to your room.  For patients admitted to the hospital, checkout time is 11:00 AM the day of discharge.   Patients discharged the day of surgery will not be allowed to drive home.  :     Please read over the following fact sheets that you were given: Coughing and Deep Breathing, Surgical Site Infection Prevention, Anesthesia Post-op Instructions and Care and Recovery After Surgery    Cataract A cataract is a clouding of the lens of the eye. When a lens becomes cloudy, vision is reduced based on the degree and nature of the clouding. Many cataracts reduce vision to some degree. Some cataracts make people more near-sighted as they develop. Other cataracts increase glare. Cataracts that are ignored and become worse can sometimes look white. The white color can be seen through the pupil. CAUSES   Aging. However, cataracts may occur at any age, even in newborns.   Certain drugs.   Trauma to the eye.   Certain diseases such as diabetes.   Specific eye diseases such as chronic inflammation inside the eye or a sudden attack of a rare form of glaucoma.   Inherited or acquired medical problems.  SYMPTOMS   Gradual, progressive drop in vision in the affected eye.   Severe, rapid visual  loss. This most often happens when trauma is the cause.  DIAGNOSIS  To detect a cataract, an eye doctor examines the lens. Cataracts are best diagnosed with an exam of the eyes with the pupils enlarged (dilated) by drops.  TREATMENT  For an early cataract, vision may improve by using different eyeglasses or stronger lighting. If that does not help your vision, surgery is the only effective treatment. A cataract needs to be surgically removed when vision loss interferes with your everyday activities, such as driving, reading, or watching TV. A cataract may also have to be removed if it prevents examination or treatment of another eye problem. Surgery removes the cloudy lens and usually replaces it with a substitute lens (intraocular lens, IOL).  At a time when both you and your doctor agree, the cataract will be surgically removed. If you have cataracts in both eyes, only one is usually removed at a time. This allows the operated eye to heal and be out of danger from any possible problems after surgery (such as infection or poor wound healing). In rare cases, a cataract may be doing damage to your eye. In these cases, your caregiver may advise surgical removal right away. The vast majority of people who have cataract surgery have better vision afterward. HOME CARE INSTRUCTIONS  If you are not planning surgery, you may be asked to do the following:  Use different eyeglasses.  Use stronger or brighter lighting.   Ask your eye doctor about reducing your medicine dose or changing medicines if it is thought that a medicine caused your cataract. Changing medicines does not make the cataract go away on its own.   Become familiar with your surroundings. Poor vision can lead to injury. Avoid bumping into things on the affected side. You are at a higher risk for tripping or falling.   Exercise extreme care when driving or operating machinery.   Wear sunglasses if you are sensitive to bright light or  experiencing problems with glare.  SEEK IMMEDIATE MEDICAL CARE IF:   You have a worsening or sudden vision loss.   You notice redness, swelling, or increasing pain in the eye.   You have a fever.  Document Released: 10/27/2005 Document Revised: 10/16/2011 Document Reviewed: 06/20/2011 [facility] Patient Information 2012 [place].PATIENT INSTRUCTIONS POST-ANESTHESIA  IMMEDIATELY FOLLOWING SURGERY:  Do not drive or operate machinery for the first twenty four hours after surgery.  Do not make any important decisions for twenty four hours after surgery or while taking narcotic pain medications or sedatives.  If you develop intractable nausea and vomiting or a severe headache please notify your doctor immediately.  FOLLOW-UP:  Please make an appointment with your surgeon as instructed. You do not need to follow up with anesthesia unless specifically instructed to do so.  WOUND CARE INSTRUCTIONS (if applicable):  Keep a dry clean dressing on the anesthesia/puncture wound site if there is drainage.  Once the wound has quit draining you may leave it open to air.  Generally you should leave the bandage intact for twenty four hours unless there is drainage.  If the epidural site drains for more than 36-48 hours please call the anesthesia department.  QUESTIONS?:  Please feel free to call your physician or the hospital operator if you have any questions, and they will be happy to assist you.

## 2016-01-28 ENCOUNTER — Other Ambulatory Visit: Payer: Self-pay

## 2016-01-28 ENCOUNTER — Encounter (HOSPITAL_COMMUNITY): Payer: Self-pay

## 2016-01-28 ENCOUNTER — Encounter (HOSPITAL_COMMUNITY)
Admission: RE | Admit: 2016-01-28 | Discharge: 2016-01-28 | Disposition: A | Discharge status: Home / Self Care | Payer: Medicare Other | Source: Ambulatory Visit | Attending: Ophthalmology | Admitting: Ophthalmology

## 2016-01-28 DIAGNOSIS — Z01810 Encounter for preprocedural cardiovascular examination: Secondary | ICD-10-CM | POA: Insufficient documentation

## 2016-01-28 DIAGNOSIS — Z01812 Encounter for preprocedural laboratory examination: Secondary | ICD-10-CM | POA: Diagnosis not present

## 2016-01-28 DIAGNOSIS — H2511 Age-related nuclear cataract, right eye: Secondary | ICD-10-CM | POA: Diagnosis not present

## 2016-01-28 LAB — BASIC METABOLIC PANEL
Anion gap: 8 (ref 5–15)
BUN: 19 mg/dL (ref 6–20)
CALCIUM: 9.6 mg/dL (ref 8.9–10.3)
CO2: 28 mmol/L (ref 22–32)
CREATININE: 0.6 mg/dL (ref 0.44–1.00)
Chloride: 105 mmol/L (ref 101–111)
GFR calc non Af Amer: >60 mL/min (ref >60)
Glucose, Bld: 67 mg/dL (ref 65–99)
Potassium: 4.2 mmol/L (ref 3.5–5.1)
Sodium: 141 mmol/L (ref 135–145)

## 2016-01-28 LAB — CBC
HEMATOCRIT: 44.4 % (ref 36.0–46.0)
Hemoglobin: 14.7 g/dL (ref 12.0–15.0)
MCH: 30.4 pg (ref 26.0–34.0)
MCHC: 33.1 g/dL (ref 30.0–36.0)
MCV: 91.7 fL (ref 78.0–100.0)
Platelets: 250 10*3/uL (ref 150–400)
RBC: 4.84 MIL/uL (ref 3.87–5.11)
RDW: 13.8 % (ref 11.5–15.5)
WBC: 9.9 10*3/uL (ref 4.0–10.5)

## 2016-01-29 DIAGNOSIS — I10 Essential (primary) hypertension: Secondary | ICD-10-CM | POA: Diagnosis not present

## 2016-01-29 DIAGNOSIS — E782 Mixed hyperlipidemia: Secondary | ICD-10-CM | POA: Diagnosis not present

## 2016-01-29 DIAGNOSIS — I480 Paroxysmal atrial fibrillation: Secondary | ICD-10-CM | POA: Diagnosis not present

## 2016-02-01 ENCOUNTER — Telehealth: Payer: Self-pay | Admitting: Family Medicine

## 2016-02-01 NOTE — Telephone Encounter (Signed)
Pls call pt, ask if willing to change from flexeril as a muscle relaxant, to baclofen, or tizanidine , felt to have less sedation, and therefore less risk of falls etc  Advise I recommend a trial  Let me know , ACTUALLY her current med list does not have flexeril, it does have xanaflex but no record of being sent , so you have to start from the beginning and find out from her what she is taking and go from there.  Her insurance sent me the notice

## 2016-02-01 NOTE — Telephone Encounter (Signed)
Called patient and left message for them to return call at the office   

## 2016-02-05 ENCOUNTER — Ambulatory Visit (HOSPITAL_COMMUNITY)
Admission: RE | Admit: 2016-02-05 | Discharge: 2016-02-05 | Disposition: A | Discharge status: Home / Self Care | Payer: Medicare Other | Source: Ambulatory Visit | Attending: Ophthalmology | Admitting: Ophthalmology

## 2016-02-05 ENCOUNTER — Encounter (HOSPITAL_COMMUNITY): Payer: Self-pay | Admitting: Anesthesiology

## 2016-02-05 ENCOUNTER — Ambulatory Visit (HOSPITAL_COMMUNITY): Payer: Medicare Other | Admitting: Anesthesiology

## 2016-02-05 ENCOUNTER — Encounter (HOSPITAL_COMMUNITY)
Admission: RE | Disposition: A | Discharge status: Home / Self Care | Payer: Self-pay | Source: Ambulatory Visit | Attending: Ophthalmology

## 2016-02-05 DIAGNOSIS — H2512 Age-related nuclear cataract, left eye: Secondary | ICD-10-CM | POA: Diagnosis not present

## 2016-02-05 DIAGNOSIS — I10 Essential (primary) hypertension: Secondary | ICD-10-CM | POA: Insufficient documentation

## 2016-02-05 DIAGNOSIS — J449 Chronic obstructive pulmonary disease, unspecified: Secondary | ICD-10-CM | POA: Insufficient documentation

## 2016-02-05 DIAGNOSIS — F17200 Nicotine dependence, unspecified, uncomplicated: Secondary | ICD-10-CM | POA: Insufficient documentation

## 2016-02-05 DIAGNOSIS — H2511 Age-related nuclear cataract, right eye: Secondary | ICD-10-CM | POA: Diagnosis not present

## 2016-02-05 DIAGNOSIS — G4730 Sleep apnea, unspecified: Secondary | ICD-10-CM | POA: Insufficient documentation

## 2016-02-05 DIAGNOSIS — K219 Gastro-esophageal reflux disease without esophagitis: Secondary | ICD-10-CM | POA: Diagnosis not present

## 2016-02-05 DIAGNOSIS — F418 Other specified anxiety disorders: Secondary | ICD-10-CM | POA: Diagnosis not present

## 2016-02-05 DIAGNOSIS — Z7984 Long term (current) use of oral hypoglycemic drugs: Secondary | ICD-10-CM | POA: Insufficient documentation

## 2016-02-05 DIAGNOSIS — E7800 Pure hypercholesterolemia, unspecified: Secondary | ICD-10-CM | POA: Diagnosis not present

## 2016-02-05 DIAGNOSIS — Z79899 Other long term (current) drug therapy: Secondary | ICD-10-CM | POA: Insufficient documentation

## 2016-02-05 DIAGNOSIS — Z7902 Long term (current) use of antithrombotics/antiplatelets: Secondary | ICD-10-CM | POA: Diagnosis not present

## 2016-02-05 DIAGNOSIS — M1991 Primary osteoarthritis, unspecified site: Secondary | ICD-10-CM | POA: Insufficient documentation

## 2016-02-05 DIAGNOSIS — Z7982 Long term (current) use of aspirin: Secondary | ICD-10-CM | POA: Diagnosis not present

## 2016-02-05 DIAGNOSIS — H268 Other specified cataract: Secondary | ICD-10-CM | POA: Insufficient documentation

## 2016-02-05 DIAGNOSIS — H269 Unspecified cataract: Secondary | ICD-10-CM | POA: Diagnosis not present

## 2016-02-05 HISTORY — PX: CATARACT EXTRACTION W/PHACO: SHX586

## 2016-02-05 SURGERY — PHACOEMULSIFICATION, CATARACT, WITH IOL INSERTION
Anesthesia: Monitor Anesthesia Care | Site: Eye | Laterality: Right

## 2016-02-05 MED ORDER — FENTANYL CITRATE (PF) 100 MCG/2ML IJ SOLN
25.0000 ug | INTRAMUSCULAR | Status: AC
  Administered 2016-02-05 (×2): 25 ug via INTRAVENOUS

## 2016-02-05 MED ORDER — LACTATED RINGERS IV SOLN
INTRAVENOUS | Status: DC
  Administered 2016-02-05: 09:00:00 via INTRAVENOUS

## 2016-02-05 MED ORDER — FENTANYL CITRATE (PF) 100 MCG/2ML IJ SOLN
INTRAMUSCULAR | Status: AC
  Filled 2016-02-05: qty 2

## 2016-02-05 MED ORDER — PHENYLEPHRINE HCL 2.5 % OP SOLN
1.0000 [drp] | OPHTHALMIC | Status: AC
  Administered 2016-02-05 (×3): 1 [drp] via OPHTHALMIC

## 2016-02-05 MED ORDER — FENTANYL CITRATE (PF) 100 MCG/2ML IJ SOLN: 25.0000 ug | INTRAMUSCULAR | Status: DC | PRN

## 2016-02-05 MED ORDER — TETRACAINE 0.5 % OP SOLN OPTIME - NO CHARGE
OPHTHALMIC | Status: DC | PRN
  Administered 2016-02-05: 2 [drp] via OPHTHALMIC

## 2016-02-05 MED ORDER — KETOROLAC TROMETHAMINE 0.5 % OP SOLN
1.0000 [drp] | OPHTHALMIC | Status: AC
  Administered 2016-02-05 (×3): 1 [drp] via OPHTHALMIC

## 2016-02-05 MED ORDER — TETRACAINE HCL 0.5 % OP SOLN
1.0000 [drp] | OPHTHALMIC | Status: AC
  Administered 2016-02-05 (×3): 1 [drp] via OPHTHALMIC

## 2016-02-05 MED ORDER — EPINEPHRINE HCL 1 MG/ML IJ SOLN
INTRAMUSCULAR | Status: AC
  Filled 2016-02-05: qty 1

## 2016-02-05 MED ORDER — BSS IO SOLN
INTRAOCULAR | Status: DC | PRN
  Administered 2016-02-05: 15 mL

## 2016-02-05 MED ORDER — EPINEPHRINE HCL 1 MG/ML IJ SOLN
INTRAOCULAR | Status: DC | PRN
  Administered 2016-02-05: 500 mL

## 2016-02-05 MED ORDER — ONDANSETRON HCL 4 MG/2ML IJ SOLN: 4.0000 mg | Freq: Once | INTRAMUSCULAR | Status: DC | PRN

## 2016-02-05 MED ORDER — MIDAZOLAM HCL 2 MG/2ML IJ SOLN
INTRAMUSCULAR | Status: AC
  Filled 2016-02-05: qty 2

## 2016-02-05 MED ORDER — CYCLOPENTOLATE-PHENYLEPHRINE 0.2-1 % OP SOLN
1.0000 [drp] | OPHTHALMIC | Status: AC
  Administered 2016-02-05 (×3): 1 [drp] via OPHTHALMIC

## 2016-02-05 MED ORDER — MIDAZOLAM HCL 2 MG/2ML IJ SOLN
1.0000 mg | INTRAMUSCULAR | Status: DC | PRN
  Administered 2016-02-05: 2 mg via INTRAVENOUS

## 2016-02-05 MED ORDER — PROVISC 10 MG/ML IO SOLN
INTRAOCULAR | Status: DC | PRN
  Administered 2016-02-05: 0.85 mL via INTRAOCULAR

## 2016-02-05 SURGICAL SUPPLY — 10 items
CLOTH BEACON ORANGE TIMEOUT ST (SAFETY) ×2 IMPLANT
EYE SHIELD UNIVERSAL CLEAR (GAUZE/BANDAGES/DRESSINGS) ×2 IMPLANT
GLOVE BIOGEL PI IND STRL 6.5 (GLOVE) IMPLANT
GLOVE BIOGEL PI INDICATOR 6.5 (GLOVE) ×2
GLOVE EXAM NITRILE MD LF STRL (GLOVE) ×2 IMPLANT
LENS ALC ACRYL/TECN (Ophthalmic Related) ×3 IMPLANT
PAD ARMBOARD 7.5X6 YLW CONV (MISCELLANEOUS) ×2 IMPLANT
TAPE SURG TRANSPORE 1 IN (GAUZE/BANDAGES/DRESSINGS) IMPLANT
TAPE SURGICAL TRANSPORE 1 IN (GAUZE/BANDAGES/DRESSINGS) ×2
WATER STERILE IRR 250ML POUR (IV SOLUTION) ×2 IMPLANT

## 2016-02-05 NOTE — H&P (Signed)
The patient was re examined and there is no change in the patients condition since the original H and P. 

## 2016-02-05 NOTE — Discharge Instructions (Signed)
°  °          [facility] Instructions °[address]- [place] [address] °    ° °1. Avoid closing eyes tightly. One often closes the eye tightly when laughing, talking, sneezing, coughing or if they feel irritated. At these times, you should be careful not to close your eyes tightly. ° °2. Instill eye drops as instructed. To instill drops in your eye, open it, look up and have someone gently pull the lower lid down and instill a couple of drops inside the lower lid. ° °3. Do not touch upper lid. ° °4. Take Advil or Tylenol for pain. ° °5. You may use either eye for near work, such as reading or sewing and you may watch television. ° °6. You may have your hair done at the beauty parlor at any time. ° °7. Wear dark glasses with or without your own glasses if you are in bright light. ° °8. Call our office at [phone] or [phone] if you have sharp pain in your eye or unusual symptoms. ° °9.  FOLLOW UP WITH DR. SHAPIRO TODAY IN HIS [place] OFFICE AT 2:45pm. ° °  °I have received a copy of the above instructions and will follow them.  ° ° ° °IF YOU ARE IN IMMEDIATE DANGER CALL 911! ° °It is important for you to keep your follow-up appointment with your physician after discharge, OR, for you /your caregiver to make a follow-up appointment with your physician / medical provider after discharge. ° °Show these instructions to the next healthcare provider you see. ° °

## 2016-02-05 NOTE — Anesthesia Preprocedure Evaluation (Signed)
Anesthesia Evaluation  Patient identified by MRN, date of birth, ID band Patient awake    Reviewed: Allergy & Precautions, NPO status , Patient's Chart, lab work & pertinent test results  History of Anesthesia Complications Negative for: history of anesthetic complications  Airway Mallampati: II  TM Distance: >3 FB Neck ROM: Full    Dental  (+) Teeth Intact, Dental Advisory Given   Pulmonary shortness of breath and with exertion, sleep apnea , COPD, Current Smoker,    Pulmonary exam normal        Cardiovascular hypertension, Pt. on medications Normal cardiovascular exam+ dysrhythmias Supra Ventricular Tachycardia      Neuro/Psych  Headaches, PSYCHIATRIC DISORDERS Anxiety Depression    GI/Hepatic hiatal hernia, GERD  ,(+) Hepatitis -, C  Endo/Other  negative endocrine ROS  Renal/GU negative Renal ROS     Musculoskeletal  (+) Arthritis , Fibromyalgia -  Abdominal   Peds  Hematology   Anesthesia Other Findings   Reproductive/Obstetrics                             Anesthesia Physical Anesthesia Plan  ASA: III  Anesthesia Plan: MAC   Post-op Pain Management:    Induction: Intravenous  Airway Management Planned: Nasal Cannula  Additional Equipment:   Intra-op Plan:   Post-operative Plan:   Informed Consent: I have reviewed the patients History and Physical, chart, labs and discussed the procedure including the risks, benefits and alternatives for the proposed anesthesia with the patient or authorized representative who has indicated his/her understanding and acceptance.     Plan Discussed with:   Anesthesia Plan Comments:         Anesthesia Quick Evaluation

## 2016-02-05 NOTE — Transfer of Care (Signed)
Immediate Anesthesia Transfer of Care Note  Patient: [name]  Procedure(s) Performed: Procedure(s) with comments: CATARACT EXTRACTION PHACO AND INTRAOCULAR LENS PLACEMENT (IOC) (Right) - CDE:21.34  Patient Location: Short Stay  Anesthesia Type:MAC  Level of Consciousness: awake  Airway & Oxygen Therapy: Patient Spontanous Breathing  Post-op Assessment: Report given to RN  Post vital signs: Reviewed  Last Vitals:  Filed Vitals:   02/05/16 0831 02/05/16 0840  BP: 107/67 108/70  Pulse: 78 76  Temp:    Resp: 18 18    Complications: No apparent anesthesia complications

## 2016-02-05 NOTE — Op Note (Signed)
Patient brought to the operating room and prepped and draped in the usual manner.  Lid speculum inserted in right eye.  Stab incision made at the twelve o'clock position.  Provisc instilled in the anterior chamber.   A 2.4 mm. Stab incision was made temporally.  An anterior capsulotomy was done with a bent 25 gauge needle.  The nucleus was hydrodissected.  The Phaco tip was inserted in the anterior chamber and the nucleus was emulsified.  CDE was 21.34.  The cortical material was then removed with the I and A tip.  Posterior capsule was the polished.  The anterior chamber was deepened with Provisc.  A 23.5 Diopter Hoya Model 250 IOL was then inserted in the capsular bag.  Provisc was then removed with the I and A tip.  The wound was then hydrated.  Patient sent to the Recovery Room in good condition with follow up in my office.  Preoperative Diagnosis:  Nuclear Cataract OD Postoperative Diagnosis:  Same Procedure name: Kelman Phacoemulsification OD with IOL

## 2016-02-05 NOTE — Anesthesia Postprocedure Evaluation (Signed)
Anesthesia Post Note  Patient: [name]  Procedure(s) Performed: Procedure(s) (LRB): CATARACT EXTRACTION PHACO AND INTRAOCULAR LENS PLACEMENT (IOC) (Right)  Patient location during evaluation: Short Stay Anesthesia Type: MAC Level of consciousness: awake and awake and alert Pain management: pain level controlled Vital Signs Assessment: post-procedure vital signs reviewed and stable Respiratory status: spontaneous breathing Cardiovascular status: blood pressure returned to baseline Postop Assessment: no signs of nausea or vomiting Anesthetic complications: no    Last Vitals:  Filed Vitals:   02/05/16 0831 02/05/16 0840  BP: 107/67 108/70  Pulse: 78 76  Temp:    Resp: 18 18    Last Pain: There were no vitals filed for this visit.               Steffan Caniglia

## 2016-02-05 NOTE — Addendum Note (Signed)
Addendum  created 02/05/16 1355 by Vista Deck, CRNA   Modules edited: Anesthesia Events, Anesthesia Responsible Staff, Narrator   Narrator:  Narrator: Event Log Edited

## 2016-02-06 ENCOUNTER — Encounter (HOSPITAL_COMMUNITY): Payer: Self-pay | Admitting: Ophthalmology

## 2016-02-06 ENCOUNTER — Telehealth: Payer: Self-pay | Admitting: Family Medicine

## 2016-02-06 NOTE — Telephone Encounter (Signed)
Pls call pt and let her knwo based on safety, ( reduced fall risk, less sedation) I am recommending abaclofen or zannaflex in place of flexeril for muscle spasm, [employer] has also sent a message omn thios

## 2016-02-08 ENCOUNTER — Other Ambulatory Visit: Payer: Self-pay

## 2016-02-08 MED ORDER — POLYETHYLENE GLYCOL 3350 17 GM/SCOOP PO POWD: ORAL | Status: DC

## 2016-02-08 NOTE — Telephone Encounter (Signed)
Currently has plenty of flexeril and will do some research and call back when she runs out and needs to get a new rx

## 2016-02-11 ENCOUNTER — Encounter (HOSPITAL_COMMUNITY): Payer: Self-pay

## 2016-02-11 ENCOUNTER — Other Ambulatory Visit (HOSPITAL_BASED_OUTPATIENT_CLINIC_OR_DEPARTMENT_OTHER): Payer: Medicare Other

## 2016-02-11 ENCOUNTER — Ambulatory Visit (HOSPITAL_COMMUNITY)
Admission: RE | Admit: 2016-02-11 | Discharge: 2016-02-11 | Disposition: A | Discharge status: Home / Self Care | Payer: Medicare Other | Source: Ambulatory Visit | Attending: Internal Medicine | Admitting: Internal Medicine

## 2016-02-11 DIAGNOSIS — I2510 Atherosclerotic heart disease of native coronary artery without angina pectoris: Secondary | ICD-10-CM | POA: Diagnosis not present

## 2016-02-11 DIAGNOSIS — C3490 Malignant neoplasm of unspecified part of unspecified bronchus or lung: Secondary | ICD-10-CM | POA: Diagnosis not present

## 2016-02-11 DIAGNOSIS — C3491 Malignant neoplasm of unspecified part of right bronchus or lung: Secondary | ICD-10-CM | POA: Diagnosis not present

## 2016-02-11 LAB — CBC WITH DIFFERENTIAL/PLATELET
BASO%: 0.4 % (ref 0.0–2.0)
Basophils Absolute: 0 10*3/uL (ref 0.0–0.1)
EOS ABS: 0.3 10*3/uL (ref 0.0–0.5)
EOS%: 2.3 % (ref 0.0–7.0)
HCT: 43.4 % (ref 34.8–46.6)
HEMOGLOBIN: 14.5 g/dL (ref 11.6–15.9)
LYMPH#: 2.3 10*3/uL (ref 0.9–3.3)
LYMPH%: 20.2 % (ref 14.0–49.7)
MCH: 29.9 pg (ref 25.1–34.0)
MCHC: 33.3 g/dL (ref 31.5–36.0)
MCV: 89.8 fL (ref 79.5–101.0)
MONO#: 1 10*3/uL — AB (ref 0.1–0.9)
MONO%: 9.3 % (ref 0.0–14.0)
NEUT%: 67.8 % (ref 38.4–76.8)
NEUTROS ABS: 7.7 10*3/uL — AB (ref 1.5–6.5)
PLATELETS: 222 10*3/uL (ref 145–400)
RBC: 4.83 10*6/uL (ref 3.70–5.45)
RDW: 14.5 % (ref 11.2–14.5)
WBC: 11.3 10*3/uL — AB (ref 3.9–10.3)

## 2016-02-11 LAB — COMPREHENSIVE METABOLIC PANEL
ALBUMIN: 3.7 g/dL (ref 3.5–5.0)
ALK PHOS: 67 U/L (ref 40–150)
ALT: 24 U/L (ref 0–55)
ANION GAP: 8 meq/L (ref 3–11)
AST: 25 U/L (ref 5–34)
BUN: 15 mg/dL (ref 7.0–26.0)
CO2: 28 meq/L (ref 22–29)
CREATININE: 0.8 mg/dL (ref 0.6–1.1)
Calcium: 9.4 mg/dL (ref 8.4–10.4)
Chloride: 105 mEq/L (ref 98–109)
EGFR: 77 mL/min/{1.73_m2} — AB (ref >90)
GLUCOSE: 93 mg/dL (ref 70–140)
Potassium: 4.2 mEq/L (ref 3.5–5.1)
Sodium: 141 mEq/L (ref 136–145)
TOTAL PROTEIN: 7.1 g/dL (ref 6.4–8.3)

## 2016-02-11 MED ORDER — IOPAMIDOL (ISOVUE-300) INJECTION 61%
75.0000 mL | Freq: Once | INTRAVENOUS | Status: AC | PRN
  Administered 2016-02-11: 75 mL via INTRAVENOUS

## 2016-02-13 DIAGNOSIS — R260 Ataxic gait: Secondary | ICD-10-CM | POA: Diagnosis not present

## 2016-02-13 DIAGNOSIS — J9611 Chronic respiratory failure with hypoxia: Secondary | ICD-10-CM | POA: Diagnosis not present

## 2016-02-13 DIAGNOSIS — H9190 Unspecified hearing loss, unspecified ear: Secondary | ICD-10-CM | POA: Diagnosis not present

## 2016-02-13 DIAGNOSIS — E7800 Pure hypercholesterolemia, unspecified: Secondary | ICD-10-CM | POA: Diagnosis not present

## 2016-02-13 DIAGNOSIS — G4733 Obstructive sleep apnea (adult) (pediatric): Secondary | ICD-10-CM | POA: Diagnosis not present

## 2016-02-13 DIAGNOSIS — Z79899 Other long term (current) drug therapy: Secondary | ICD-10-CM | POA: Diagnosis not present

## 2016-02-13 DIAGNOSIS — M1990 Unspecified osteoarthritis, unspecified site: Secondary | ICD-10-CM | POA: Diagnosis not present

## 2016-02-13 DIAGNOSIS — M79609 Pain in unspecified limb: Secondary | ICD-10-CM | POA: Diagnosis not present

## 2016-02-13 DIAGNOSIS — G8922 Chronic post-thoracotomy pain: Secondary | ICD-10-CM | POA: Diagnosis not present

## 2016-02-13 DIAGNOSIS — M79673 Pain in unspecified foot: Secondary | ICD-10-CM | POA: Diagnosis not present

## 2016-02-14 ENCOUNTER — Encounter (HOSPITAL_COMMUNITY)
Admission: RE | Admit: 2016-02-14 | Discharge: 2016-02-14 | Disposition: A | Discharge status: Home / Self Care | Payer: Medicare Other | Source: Ambulatory Visit | Attending: Ophthalmology | Admitting: Ophthalmology

## 2016-02-14 ENCOUNTER — Encounter (HOSPITAL_COMMUNITY): Payer: Self-pay

## 2016-02-14 ENCOUNTER — Ambulatory Visit (HOSPITAL_BASED_OUTPATIENT_CLINIC_OR_DEPARTMENT_OTHER): Payer: Medicare Other | Admitting: Internal Medicine

## 2016-02-14 ENCOUNTER — Encounter: Payer: Self-pay | Admitting: Internal Medicine

## 2016-02-14 VITALS — BP 121/66 | HR 65 | Temp 98.2°F | Resp 18 | Ht 63.0 in | Wt 147.8 lb

## 2016-02-14 DIAGNOSIS — C3431 Malignant neoplasm of lower lobe, right bronchus or lung: Secondary | ICD-10-CM

## 2016-02-14 DIAGNOSIS — C3491 Malignant neoplasm of unspecified part of right bronchus or lung: Secondary | ICD-10-CM
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
## 2016-02-15 ENCOUNTER — Telehealth: Payer: Self-pay | Admitting: Internal Medicine

## 2016-02-15 DIAGNOSIS — H1820 Unspecified corneal edema: Secondary | ICD-10-CM | POA: Diagnosis not present

## 2016-02-15 NOTE — Telephone Encounter (Signed)
lvm for pt for OCT appts

## 2016-02-19 ENCOUNTER — Ambulatory Visit (HOSPITAL_COMMUNITY): Payer: Medicare Other | Admitting: Anesthesiology

## 2016-02-19 ENCOUNTER — Ambulatory Visit (HOSPITAL_COMMUNITY)
Admission: RE | Admit: 2016-02-19 | Discharge: 2016-02-19 | Disposition: A | Discharge status: Home / Self Care | Payer: Medicare Other | Source: Ambulatory Visit | Attending: Ophthalmology | Admitting: Ophthalmology

## 2016-02-19 ENCOUNTER — Encounter (HOSPITAL_COMMUNITY): Payer: Self-pay | Admitting: *Deleted

## 2016-02-19 ENCOUNTER — Encounter (HOSPITAL_COMMUNITY)
Admission: RE | Disposition: A | Discharge status: Home / Self Care | Payer: Self-pay | Source: Ambulatory Visit | Attending: Ophthalmology

## 2016-02-19 DIAGNOSIS — Z79899 Other long term (current) drug therapy: Secondary | ICD-10-CM | POA: Diagnosis not present

## 2016-02-19 DIAGNOSIS — H268 Other specified cataract: Secondary | ICD-10-CM | POA: Insufficient documentation

## 2016-02-19 DIAGNOSIS — J449 Chronic obstructive pulmonary disease, unspecified: Secondary | ICD-10-CM | POA: Insufficient documentation

## 2016-02-19 DIAGNOSIS — M797 Fibromyalgia: Secondary | ICD-10-CM | POA: Insufficient documentation

## 2016-02-19 DIAGNOSIS — F418 Other specified anxiety disorders: Secondary | ICD-10-CM | POA: Diagnosis not present

## 2016-02-19 DIAGNOSIS — E7800 Pure hypercholesterolemia, unspecified: Secondary | ICD-10-CM | POA: Insufficient documentation

## 2016-02-19 DIAGNOSIS — K219 Gastro-esophageal reflux disease without esophagitis: Secondary | ICD-10-CM | POA: Diagnosis not present

## 2016-02-19 DIAGNOSIS — H2512 Age-related nuclear cataract, left eye: Secondary | ICD-10-CM | POA: Diagnosis not present

## 2016-02-19 DIAGNOSIS — I10 Essential (primary) hypertension: Secondary | ICD-10-CM | POA: Diagnosis not present

## 2016-02-19 DIAGNOSIS — G4730 Sleep apnea, unspecified: Secondary | ICD-10-CM | POA: Diagnosis not present

## 2016-02-19 DIAGNOSIS — Z7902 Long term (current) use of antithrombotics/antiplatelets: Secondary | ICD-10-CM | POA: Insufficient documentation

## 2016-02-19 DIAGNOSIS — H269 Unspecified cataract: Secondary | ICD-10-CM | POA: Diagnosis not present

## 2016-02-19 DIAGNOSIS — M1991 Primary osteoarthritis, unspecified site: Secondary | ICD-10-CM | POA: Diagnosis not present

## 2016-02-19 DIAGNOSIS — F17200 Nicotine dependence, unspecified, uncomplicated: Secondary | ICD-10-CM | POA: Insufficient documentation

## 2016-02-19 HISTORY — PX: CATARACT EXTRACTION W/PHACO: SHX586

## 2016-02-19 SURGERY — PHACOEMULSIFICATION, CATARACT, WITH IOL INSERTION
Anesthesia: Monitor Anesthesia Care | Site: Eye | Laterality: Left

## 2016-02-19 MED ORDER — MIDAZOLAM HCL 2 MG/2ML IJ SOLN
1.0000 mg | INTRAMUSCULAR | Status: DC | PRN
  Administered 2016-02-19: 2 mg via INTRAVENOUS
  Filled 2016-02-19: qty 2

## 2016-02-19 MED ORDER — BSS IO SOLN
INTRAOCULAR | Status: DC | PRN
  Administered 2016-02-19: 15 mL

## 2016-02-19 MED ORDER — EPINEPHRINE HCL 1 MG/ML IJ SOLN
INTRAOCULAR | Status: DC | PRN
  Administered 2016-02-19: 500 mL

## 2016-02-19 MED ORDER — LIDOCAINE HCL (PF) 1 % IJ SOLN
INTRAMUSCULAR | Status: AC
  Filled 2016-02-19: qty 2

## 2016-02-19 MED ORDER — EPINEPHRINE HCL 1 MG/ML IJ SOLN
INTRAMUSCULAR | Status: AC
  Filled 2016-02-19: qty 1

## 2016-02-19 MED ORDER — TETRACAINE HCL 0.5 % OP SOLN
1.0000 [drp] | OPHTHALMIC | Status: AC
  Administered 2016-02-19 (×3): 1 [drp] via OPHTHALMIC

## 2016-02-19 MED ORDER — LACTATED RINGERS IV SOLN
INTRAVENOUS | Status: DC
  Administered 2016-02-19: 11:00:00 via INTRAVENOUS

## 2016-02-19 MED ORDER — FENTANYL CITRATE (PF) 100 MCG/2ML IJ SOLN
25.0000 ug | INTRAMUSCULAR | Status: AC
  Administered 2016-02-19 (×2): 25 ug via INTRAVENOUS
  Filled 2016-02-19: qty 2

## 2016-02-19 MED ORDER — PHENYLEPHRINE HCL 2.5 % OP SOLN
1.0000 [drp] | OPHTHALMIC | Status: AC
  Administered 2016-02-19 (×3): 1 [drp] via OPHTHALMIC

## 2016-02-19 MED ORDER — PROVISC 10 MG/ML IO SOLN
INTRAOCULAR | Status: DC | PRN
  Administered 2016-02-19: 0.85 mL via INTRAOCULAR

## 2016-02-19 MED ORDER — CYCLOPENTOLATE-PHENYLEPHRINE 0.2-1 % OP SOLN
1.0000 [drp] | OPHTHALMIC | Status: AC
  Administered 2016-02-19 (×3): 1 [drp] via OPHTHALMIC

## 2016-02-19 MED ORDER — KETOROLAC TROMETHAMINE 0.5 % OP SOLN
1.0000 [drp] | OPHTHALMIC | Status: AC
  Administered 2016-02-19 (×3): 1 [drp] via OPHTHALMIC

## 2016-02-19 MED ORDER — TETRACAINE 0.5 % OP SOLN OPTIME - NO CHARGE
OPHTHALMIC | Status: DC | PRN
  Administered 2016-02-19: 2 [drp] via OPHTHALMIC

## 2016-02-19 SURGICAL SUPPLY — 12 items
CLOTH BEACON ORANGE TIMEOUT ST (SAFETY) ×2 IMPLANT
EYE SHIELD UNIVERSAL CLEAR (GAUZE/BANDAGES/DRESSINGS) ×2 IMPLANT
GLOVE BIO SURGEON STRL SZ 6.5 (GLOVE) ×1 IMPLANT
GLOVE BIO SURGEONS STRL SZ 6.5 (GLOVE) ×1
GLOVE BIOGEL PI IND STRL 7.0 (GLOVE) IMPLANT
GLOVE BIOGEL PI INDICATOR 7.0 (GLOVE) ×2
GLOVE EXAM NITRILE MD LF STRL (GLOVE) ×2 IMPLANT
LENS ALC ACRYL/TECN (Ophthalmic Related) ×3 IMPLANT
PAD ARMBOARD 7.5X6 YLW CONV (MISCELLANEOUS) ×2 IMPLANT
TAPE SURG TRANSPORE 1 IN (GAUZE/BANDAGES/DRESSINGS) IMPLANT
TAPE SURGICAL TRANSPORE 1 IN (GAUZE/BANDAGES/DRESSINGS) ×2
WATER STERILE IRR 250ML POUR (IV SOLUTION) ×2 IMPLANT

## 2016-02-19 NOTE — H&P (Signed)
The patient was re examined and there is no change in the patients condition since the original H and P. 

## 2016-02-19 NOTE — Transfer of Care (Signed)
Immediate Anesthesia Transfer of Care Note  Patient: [name]  Procedure(s) Performed: Procedure(s) with comments: CATARACT EXTRACTION PHACO AND INTRAOCULAR LENS PLACEMENT (IOC) (Left) - CDE: 11.59  Patient Location: Short Stay  Anesthesia Type:MAC  Level of Consciousness: awake, alert , oriented and patient cooperative  Airway & Oxygen Therapy: Patient Spontanous Breathing  Post-op Assessment: Report given to RN, Post -op Vital signs reviewed and stable and Patient moving all extremities  Post vital signs: Reviewed and stable  Last Vitals:  Filed Vitals:   02/19/16 1130 02/19/16 1135  BP: 100/56 102/57  Pulse:    Temp:    Resp: 39 18    Complications: No apparent anesthesia complications

## 2016-02-19 NOTE — Anesthesia Preprocedure Evaluation (Signed)
Anesthesia Evaluation  Patient identified by MRN, date of birth, ID band Patient awake    Reviewed: Allergy & Precautions, NPO status , Patient's Chart, lab work & pertinent test results  History of Anesthesia Complications Negative for: history of anesthetic complications  Airway Mallampati: II  TM Distance: >3 FB Neck ROM: Full    Dental  (+) Teeth Intact, Dental Advisory Given   Pulmonary shortness of breath and with exertion, sleep apnea , COPD, Current Smoker,    Pulmonary exam normal        Cardiovascular hypertension, Pt. on medications Normal cardiovascular exam+ dysrhythmias Supra Ventricular Tachycardia      Neuro/Psych  Headaches, PSYCHIATRIC DISORDERS Anxiety Depression    GI/Hepatic hiatal hernia, GERD  ,(+) Hepatitis -, C  Endo/Other  negative endocrine ROS  Renal/GU negative Renal ROS     Musculoskeletal  (+) Arthritis , Fibromyalgia -  Abdominal   Peds  Hematology   Anesthesia Other Findings   Reproductive/Obstetrics                             Anesthesia Physical Anesthesia Plan  ASA: III  Anesthesia Plan: MAC   Post-op Pain Management:    Induction: Intravenous  Airway Management Planned: Nasal Cannula  Additional Equipment:   Intra-op Plan:   Post-operative Plan:   Informed Consent: I have reviewed the patients History and Physical, chart, labs and discussed the procedure including the risks, benefits and alternatives for the proposed anesthesia with the patient or authorized representative who has indicated his/her understanding and acceptance.     Plan Discussed with:   Anesthesia Plan Comments:         Anesthesia Quick Evaluation

## 2016-02-19 NOTE — Discharge Instructions (Signed)
°  °          [facility] Instructions °[address]- [place] [address] °    ° °1. Avoid closing eyes tightly. One often closes the eye tightly when laughing, talking, sneezing, coughing or if they feel irritated. At these times, you should be careful not to close your eyes tightly. ° °2. Instill eye drops as instructed. To instill drops in your eye, open it, look up and have someone gently pull the lower lid down and instill a couple of drops inside the lower lid. ° °3. Do not touch upper lid. ° °4. Take Advil or Tylenol for pain. ° °5. You may use either eye for near work, such as reading or sewing and you may watch television. ° °6. You may have your hair done at the beauty parlor at any time. ° °7. Wear dark glasses with or without your own glasses if you are in bright light. ° °8. Call our office at [phone] or [phone] if you have sharp pain in your eye or unusual symptoms. ° °9.  FOLLOW UP WITH DR. SHAPIRO TODAY IN HIS [facility] OFFICE AT 2:45pm. ° °  °I have received a copy of the above instructions and will follow them.  ° ° ° °IF YOU ARE IN IMMEDIATE DANGER CALL 911! ° °It is important for you to keep your follow-up appointment with your physician after discharge, OR, for you /your caregiver to make a follow-up appointment with your physician / medical provider after discharge. ° °Show these instructions to the next healthcare provider you see. ° °

## 2016-02-19 NOTE — Op Note (Signed)
Patient brought to the operating room and prepped and draped in the usual manner.  Lid speculum inserted in left eye.  Stab incision made at the twelve o'clock position.  Provisc instilled in the anterior chamber.   A 2.4 mm. Stab incision was made temporally.  An anterior capsulotomy was done with a bent 25 gauge needle.  The nucleus was hydrodissected.  The Phaco tip was inserted in the anterior chamber and the nucleus was emulsified.  CDE was 11.59.  The cortical material was then removed with the I and A tip.  Posterior capsule was the polished.  The anterior chamber was deepened with Provisc.  A 23.5 Diopter Hoya Model 250 IOL was then inserted in the capsular bag.  Provisc was then removed with the I and A tip.  The wound was then hydrated.  Patient sent to the Recovery Room in good condition with follow up in my office.  Preoperative Diagnosis:  Nuclear Cataract OS Postoperative Diagnosis:  Same Procedure name: [facility] Phacoemulsification OS with IOL

## 2016-02-19 NOTE — Anesthesia Postprocedure Evaluation (Signed)
Anesthesia Post Note  Patient: [name]  Procedure(s) Performed: Procedure(s) (LRB): CATARACT EXTRACTION PHACO AND INTRAOCULAR LENS PLACEMENT (IOC) (Left)  Patient location during evaluation: Short Stay Anesthesia Type: MAC Level of consciousness: awake and alert, oriented and patient cooperative Pain management: pain level controlled Vital Signs Assessment: post-procedure vital signs reviewed and stable Respiratory status: respiratory function stable and nonlabored ventilation Cardiovascular status: blood pressure returned to baseline Postop Assessment: no signs of nausea or vomiting and adequate PO intake Anesthetic complications: no    Last Vitals:  Filed Vitals:   02/19/16 1130 02/19/16 1135  BP: 100/56 102/57  Pulse:    Temp:    Resp: 39 18    Last Pain: There were no vitals filed for this visit.               Laneya Gasaway J

## 2016-02-20 ENCOUNTER — Encounter (HOSPITAL_COMMUNITY): Payer: Self-pay | Admitting: Ophthalmology

## 2016-03-17 DIAGNOSIS — M79609 Pain in unspecified limb: Secondary | ICD-10-CM | POA: Diagnosis not present

## 2016-03-17 DIAGNOSIS — M1990 Unspecified osteoarthritis, unspecified site: Secondary | ICD-10-CM | POA: Diagnosis not present

## 2016-03-17 DIAGNOSIS — E7800 Pure hypercholesterolemia, unspecified: Secondary | ICD-10-CM | POA: Diagnosis not present

## 2016-03-17 DIAGNOSIS — C3491 Malignant neoplasm of unspecified part of right bronchus or lung: Secondary | ICD-10-CM | POA: Diagnosis not present

## 2016-03-17 DIAGNOSIS — G8922 Chronic post-thoracotomy pain: Secondary | ICD-10-CM | POA: Diagnosis not present

## 2016-03-17 DIAGNOSIS — G4733 Obstructive sleep apnea (adult) (pediatric): Secondary | ICD-10-CM | POA: Diagnosis not present

## 2016-03-17 DIAGNOSIS — J449 Chronic obstructive pulmonary disease, unspecified: Secondary | ICD-10-CM | POA: Diagnosis not present

## 2016-03-17 DIAGNOSIS — F411 Generalized anxiety disorder: Secondary | ICD-10-CM | POA: Diagnosis not present

## 2016-03-17 DIAGNOSIS — M79673 Pain in unspecified foot: Secondary | ICD-10-CM | POA: Diagnosis not present

## 2016-03-17 DIAGNOSIS — R260 Ataxic gait: Secondary | ICD-10-CM | POA: Diagnosis not present

## 2016-03-17 DIAGNOSIS — Z79899 Other long term (current) drug therapy: Secondary | ICD-10-CM | POA: Diagnosis not present

## 2016-03-17 DIAGNOSIS — M62838 Other muscle spasm: Secondary | ICD-10-CM | POA: Diagnosis not present

## 2016-03-20 ENCOUNTER — Telehealth: Payer: Self-pay | Admitting: Family Medicine

## 2016-03-20 MED ORDER — DOXYCYCLINE HYCLATE 100 MG PO TABS: 100.0000 mg | ORAL_TABLET | Freq: Two times a day (BID) | ORAL | Status: DC

## 2016-03-20 NOTE — Telephone Encounter (Signed)
Patient is calling for advice she stepped on a screw and it went pretty deep into her left foot, she is asking how to treat, please advise?

## 2016-03-20 NOTE — Telephone Encounter (Signed)
Patient aware.

## 2016-03-20 NOTE — Telephone Encounter (Signed)
pls advise keep wound clean and covered with dry loose gauze sevene day antibiotic course is so [employer]

## 2016-03-20 NOTE — Telephone Encounter (Signed)
Spoke with patient and she stepped on a wooden board that had a screw through it.  The screw went into her left foot.  She wrapped foot with gauze last night and drainage penetrated through bandage and sock overnight.   She does not have transportation to come in for ov.  She is up to date on tetanus.    Injury occurred on 03/19/2016  Please advise.

## 2016-03-31 DIAGNOSIS — M1990 Unspecified osteoarthritis, unspecified site: Secondary | ICD-10-CM | POA: Diagnosis not present

## 2016-03-31 DIAGNOSIS — Z79899 Other long term (current) drug therapy: Secondary | ICD-10-CM | POA: Diagnosis not present

## 2016-03-31 DIAGNOSIS — M62838 Other muscle spasm: Secondary | ICD-10-CM | POA: Diagnosis not present

## 2016-03-31 DIAGNOSIS — M79609 Pain in unspecified limb: Secondary | ICD-10-CM | POA: Diagnosis not present

## 2016-04-21 DIAGNOSIS — Z79899 Other long term (current) drug therapy: Secondary | ICD-10-CM | POA: Diagnosis not present

## 2016-04-21 DIAGNOSIS — R260 Ataxic gait: Secondary | ICD-10-CM | POA: Diagnosis not present

## 2016-04-21 DIAGNOSIS — M62838 Other muscle spasm: Secondary | ICD-10-CM | POA: Diagnosis not present

## 2016-04-21 DIAGNOSIS — M1990 Unspecified osteoarthritis, unspecified site: Secondary | ICD-10-CM | POA: Diagnosis not present

## 2016-04-21 DIAGNOSIS — F411 Generalized anxiety disorder: Secondary | ICD-10-CM | POA: Diagnosis not present

## 2016-04-21 DIAGNOSIS — G8922 Chronic post-thoracotomy pain: Secondary | ICD-10-CM | POA: Diagnosis not present

## 2016-04-21 DIAGNOSIS — G894 Chronic pain syndrome: Secondary | ICD-10-CM | POA: Diagnosis not present

## 2016-04-21 DIAGNOSIS — M25571 Pain in right ankle and joints of right foot: Secondary | ICD-10-CM | POA: Diagnosis not present

## 2016-05-03 DIAGNOSIS — R069 Unspecified abnormalities of breathing: Secondary | ICD-10-CM | POA: Diagnosis not present

## 2016-06-02 DIAGNOSIS — J9611 Chronic respiratory failure with hypoxia: Secondary | ICD-10-CM | POA: Diagnosis not present

## 2016-06-02 DIAGNOSIS — Z79899 Other long term (current) drug therapy: Secondary | ICD-10-CM | POA: Diagnosis not present

## 2016-06-02 DIAGNOSIS — M62838 Other muscle spasm: Secondary | ICD-10-CM | POA: Diagnosis not present

## 2016-06-02 DIAGNOSIS — C3491 Malignant neoplasm of unspecified part of right bronchus or lung: Secondary | ICD-10-CM | POA: Diagnosis not present

## 2016-06-02 DIAGNOSIS — G8922 Chronic post-thoracotomy pain: Secondary | ICD-10-CM | POA: Diagnosis not present

## 2016-06-02 DIAGNOSIS — M25571 Pain in right ankle and joints of right foot: Secondary | ICD-10-CM | POA: Diagnosis not present

## 2016-06-02 DIAGNOSIS — R260 Ataxic gait: Secondary | ICD-10-CM | POA: Diagnosis not present

## 2016-06-02 DIAGNOSIS — G4733 Obstructive sleep apnea (adult) (pediatric): Secondary | ICD-10-CM | POA: Diagnosis not present

## 2016-06-11 ENCOUNTER — Ambulatory Visit (INDEPENDENT_AMBULATORY_CARE_PROVIDER_SITE_OTHER): Payer: Medicare Other | Admitting: Family Medicine

## 2016-06-11 ENCOUNTER — Encounter: Payer: Self-pay | Admitting: Family Medicine

## 2016-06-11 VITALS — BP 134/60 | HR 60 | Resp 18 | Ht 63.0 in | Wt 136.1 lb

## 2016-06-11 DIAGNOSIS — Z604 Social exclusion and rejection: Secondary | ICD-10-CM

## 2016-06-11 DIAGNOSIS — R040 Epistaxis: Secondary | ICD-10-CM | POA: Insufficient documentation

## 2016-06-11 DIAGNOSIS — R7301 Impaired fasting glucose: Secondary | ICD-10-CM

## 2016-06-11 DIAGNOSIS — I10 Essential (primary) hypertension: Secondary | ICD-10-CM | POA: Diagnosis not present

## 2016-06-11 DIAGNOSIS — E559 Vitamin D deficiency, unspecified: Secondary | ICD-10-CM

## 2016-06-11 DIAGNOSIS — F17200 Nicotine dependence, unspecified, uncomplicated: Secondary | ICD-10-CM

## 2016-06-11 DIAGNOSIS — E785 Hyperlipidemia, unspecified: Secondary | ICD-10-CM

## 2016-06-11 DIAGNOSIS — F411 Generalized anxiety disorder: Secondary | ICD-10-CM

## 2016-06-11 DIAGNOSIS — T148 Other injury of unspecified body region: Secondary | ICD-10-CM

## 2016-06-11 DIAGNOSIS — C3490 Malignant neoplasm of unspecified part of unspecified bronchus or lung: Secondary | ICD-10-CM

## 2016-06-11 DIAGNOSIS — W57XXXA Bitten or stung by nonvenomous insect and other nonvenomous arthropods, initial encounter: Secondary | ICD-10-CM

## 2016-06-11 DIAGNOSIS — E739 Lactose intolerance, unspecified: Secondary | ICD-10-CM

## 2016-06-11 LAB — COMPREHENSIVE METABOLIC PANEL
ALBUMIN: 4 g/dL (ref 3.6–5.1)
ALT: 15 U/L (ref 6–29)
AST: 17 U/L (ref 10–35)
Alkaline Phosphatase: 58 U/L (ref 33–130)
BILIRUBIN TOTAL: 0.3 mg/dL (ref 0.2–1.2)
BUN: 15 mg/dL (ref 7–25)
CHLORIDE: 105 mmol/L (ref 98–110)
CO2: 26 mmol/L (ref 20–31)
CREATININE: 0.71 mg/dL (ref 0.50–0.99)
Calcium: 9.2 mg/dL (ref 8.6–10.4)
Glucose, Bld: 89 mg/dL (ref 65–99)
Potassium: 4.6 mmol/L (ref 3.5–5.3)
SODIUM: 140 mmol/L (ref 135–146)
TOTAL PROTEIN: 6.6 g/dL (ref 6.1–8.1)

## 2016-06-11 LAB — CBC
HEMATOCRIT: 42.6 % (ref 35.0–45.0)
Hemoglobin: 14.1 g/dL (ref 11.7–15.5)
MCH: 30.4 pg (ref 27.0–33.0)
MCHC: 33.1 g/dL (ref 32.0–36.0)
MCV: 91.8 fL (ref 80.0–100.0)
MPV: 10.6 fL (ref 7.5–12.5)
PLATELETS: 222 10*3/uL (ref 140–400)
RBC: 4.64 MIL/uL (ref 3.80–5.10)
RDW: 13.7 % (ref 11.0–15.0)
WBC: 9.8 10*3/uL (ref 3.8–10.8)

## 2016-06-11 LAB — PLATELET COUNT: PLATELETS: 222 10*3/uL (ref 140–400)

## 2016-06-11 LAB — TSH: TSH: 1.11 mIU/L

## 2016-06-11 LAB — LIPID PANEL
Cholesterol: 284 mg/dL — ABNORMAL HIGH (ref 125–200)
HDL: 50 mg/dL (ref >=46)
LDL Cholesterol: 203 mg/dL — ABNORMAL HIGH (ref <130)
Total CHOL/HDL Ratio: 5.7 Ratio — ABNORMAL HIGH (ref <=5.0)
Triglycerides: 156 mg/dL — ABNORMAL HIGH (ref <150)
VLDL: 31 mg/dL — ABNORMAL HIGH (ref <30)

## 2016-06-11 NOTE — Assessment & Plan Note (Signed)
Controlled, no change in medication DASH diet and commitment to daily physical activity for a minimum of 30 minutes discussed and encouraged, as a part of hypertension management. The importance of attaining a healthy weight is also discussed.  BP/Weight 06/11/2016 02/19/2016 02/14/2016 02/05/2016 01/28/2016 12/12/2015 33/83/2919  Systolic BP 166 99 060 97 [phone]  Diastolic BP 60 71 66 59 56 60 66  Wt. (Lbs) 136.12 147 147.8 144 144 143 140  BMI 24.11 26.05 26.19 25.51 25.51 26.15 25.6

## 2016-06-11 NOTE — Assessment & Plan Note (Signed)
Increased symtoms with new stress at home caring for avery ill friend, no med change, will attempt to get her to PACE program in her area

## 2016-06-11 NOTE — Assessment & Plan Note (Signed)
Hyperlipidemia:Low fat diet discussed and encouraged.   Lipid Panel  Lab Results  Component Value Date   CHOL 203 (H) 06/27/2015   HDL 57 06/27/2015   LDLCALC 121 06/27/2015   TRIG 124 06/27/2015   CHOLHDL 3.6 06/27/2015     Updated lab today

## 2016-06-11 NOTE — Progress Notes (Signed)
[name]     MRN: [id number]      DOB: Oct 16, 1951   HPI [name] is here for follow up and re-evaluation of chronic medical conditions, medication management and review of any available recent lab and radiology data.  Preventive health is updated, specifically  Cancer screening and Immunization.   Questions or concerns regarding consultations or procedures which the PT has had in the interim are  addressed. The PT stopped her statin, states she was told by cardiology that the med she was on had the  greatest s/e of hair loss, wants to know what to take in its place.Left sided epistaxis for 3 days, 5 days ago, is now off of plavix. Increased stress caring for an extremely ill girlfriend who has been her support in the past, requesting services with the PACE program in [place] where she can have daycare services and see MD on site , which will indeed be very good for her as she has no transport of her own Has upcoming cardiology and oncology appts as well as lab and chest scan , she is aware of appt info    ROS Denies recent fever or chills. Denies sinus pressure, nasal congestion, ear pain or sore throat. Denies chest congestion, productive cough or wheezing. Denies chest pains, palpitations and leg swelling Denies abdominal pain, nausea, vomiting,diarrhea or constipation.   Denies dysuria, frequency, hesitancy or incontinence. Denies uncontrolled joint pain, swelling and limitation in mobility. Denies headaches, seizures, numbness, or tingling. C/o increased  Depression and  anxiety due to stress at home. C/o multiple tick bites, over 8 ticks removed in past several weeks, now c/o generalized weakness and muscle pain, concerned about possible exposure to  infection   PE  BP 134/60   Pulse 60   Resp 18   Ht '5\' 3"'$  (1.6 m)   Wt 136 lb 1.9 oz (61.7 kg)   SpO2 96%   BMI 24.11 kg/m   Patient alert and oriented and in no cardiopulmonary distress.  HEENT: No facial asymmetry,  EOMI,   oropharynx pink and moist.  Neck supple no JVD, no mass.  Chest: Clear to auscultation bilaterally.decreased though adequate air entry CVS: S1, S2 no murmurs, no S3.Regular rate.  ABD: Soft non tender.   Ext: No edema  MS: Adequate ROM spine, shoulders, hips and knees.  Skin: Intact, no ulcerations or rash noted.  Psych: Good eye contact, normal affect. Memory intact  anxious mildly  depressed appearing.  CNS: CN 2-12 intact, power,  normal throughout.no focal deficits noted.   Assessment & Plan  Essential hypertension Controlled, no change in medication DASH diet and commitment to daily physical activity for a minimum of 30 minutes discussed and encouraged, as a part of hypertension management. The importance of attaining a healthy weight is also discussed.  BP/Weight 06/11/2016 02/19/2016 02/14/2016 02/05/2016 01/28/2016 12/12/2015 40/98/1191  Systolic BP 478 99 295 97 [phone]  Diastolic BP 60 71 66 59 56 60 66  Wt. (Lbs) 136.12 147 147.8 144 144 143 140  BMI 24.11 26.05 26.19 25.51 25.51 26.15 25.6       GAD (generalized anxiety disorder) Increased symtoms with new stress at home caring for avery ill friend, no med change, will attempt to get her to PACE program in her area  Social isolation Nursing to work on getting pr in [facility] program in [place]  Squamous cell lung cancer Planned f/u with oncology in next 2 to 3 months, needs lab and  chest scan prior to visit and she is aware  NICOTINE ADDICTION Patient counseled for approximately 5 minutes regarding the health risks of ongoing nicotine use, specifically all types of cancer, heart disease, stroke and respiratory failure. The options available for help with cessation ,the behavioral changes to assist the process, and the option to either gradully reduce usage  Or abruptly stop.is also discussed. Pt is also encouraged to set specific goals in number of cigarettes used daily, as well as to set a quit  date.      Hyperlipemia Hyperlipidemia:Low fat diet discussed and encouraged.   Lipid Panel  Lab Results  Component Value Date   CHOL 203 (H) 06/27/2015   HDL 57 06/27/2015   LDLCALC 121 06/27/2015   TRIG 124 06/27/2015   CHOLHDL 3.6 06/27/2015     Updated lab today   GLUCOSE INTOLERANCE Patient educated about the importance of limiting  Carbohydrate intake , the need to commit to daily physical activity for a minimum of 30 minutes , and to commit weight loss. The fact that changes in all these areas will reduce or eliminate all together the development of diabetes is stressed.  Updated lab today   Diabetic Labs Latest Ref Rng & Units 02/11/2016 01/28/2016 08/16/2015 07/28/2015 07/27/2015  HbA1c <5.7 % - - - - -  Chol 125 - 200 mg/dL - - - - -  HDL >=46 mg/dL - - - - -  Calc LDL <130 mg/dL - - - - -  Triglycerides <150 mg/dL - - - - -  Creatinine 0.6 - 1.1 mg/dL 0.8 0.60 0.7 0.52 0.54   BP/Weight 06/11/2016 02/19/2016 02/14/2016 02/05/2016 01/28/2016 12/12/2015 05/69/7948  Systolic BP 016 99 553 97 748 270 786  Diastolic BP 60 71 66 59 56 60 66  Wt. (Lbs) 136.12 147 147.8 144 144 143 140  BMI 24.11 26.05 26.19 25.51 25.51 26.15 25.6   No flowsheet data found.    Left-sided epistaxis 3 day h/o spontaneuous left epistaxis, refer ENT for eval  Tick bites Multiple tick bites , with generalized myalgia  And  [place] , will check titers for Lyme and RMSF No fever or chills by history

## 2016-06-11 NOTE — Patient Instructions (Addendum)
F/u in 4 month, call if you need me before  Will call pharmacy re best cholesterol med and hair loss  You are referred to ENT re left nose bleed  We will refer you to PACE program in [facility] today, fasting lipid, cmp , CBC , platelet, lyme, RMSSF, TSH and vit D today  Thank you  for choosing [facility]. We consider it a privelige to serve you.  Delivering excellent health care in a caring and  compassionate way is our goal.  Partnering with you,  so that together we can achieve this goal is our strategy.

## 2016-06-11 NOTE — Assessment & Plan Note (Signed)
Nursing to work on getting pr in [place] program in [place]

## 2016-06-11 NOTE — Assessment & Plan Note (Addendum)
Multiple tick bites , with generalized myalgia  And  Malaise , will check titers for Lyme and RMSF, no h/o retained tick or skin lesions. No fever or chills by history

## 2016-06-11 NOTE — Assessment & Plan Note (Signed)
Planned f/u with oncology in next 2 to 3 months, needs lab and chest scan prior to visit and she is aware

## 2016-06-11 NOTE — Assessment & Plan Note (Signed)
Patient educated about the importance of limiting  Carbohydrate intake , the need to commit to daily physical activity for a minimum of 30 minutes , and to commit weight loss. The fact that changes in all these areas will reduce or eliminate all together the development of diabetes is stressed.  Updated lab today   Diabetic Labs Latest Ref Rng & Units 02/11/2016 01/28/2016 08/16/2015 07/28/2015 07/27/2015  HbA1c <5.7 % - - - - -  Chol 125 - 200 mg/dL - - - - -  HDL >=46 mg/dL - - - - -  Calc LDL <130 mg/dL - - - - -  Triglycerides <150 mg/dL - - - - -  Creatinine 0.6 - 1.1 mg/dL 0.8 0.60 0.7 0.52 0.54   BP/Weight 06/11/2016 02/19/2016 02/14/2016 02/05/2016 01/28/2016 12/12/2015 35/67/0141  Systolic BP 030 99 131 97 [phone]  Diastolic BP 60 71 66 59 56 60 66  Wt. (Lbs) 136.12 147 147.8 144 144 143 140  BMI 24.11 26.05 26.19 25.51 25.51 26.15 25.6   No flowsheet data found.

## 2016-06-11 NOTE — Assessment & Plan Note (Signed)

## 2016-06-11 NOTE — Assessment & Plan Note (Signed)
3 day h/o spontaneuous left epistaxis, refer ENT for eval

## 2016-06-12 LAB — VITAMIN D 25 HYDROXY (VIT D DEFICIENCY, FRACTURES): VIT D 25 HYDROXY: 30 ng/mL (ref 30–100)

## 2016-06-13 ENCOUNTER — Other Ambulatory Visit: Payer: Self-pay

## 2016-06-13 DIAGNOSIS — I10 Essential (primary) hypertension: Secondary | ICD-10-CM

## 2016-06-13 DIAGNOSIS — C3491 Malignant neoplasm of unspecified part of right bronchus or lung: Secondary | ICD-10-CM

## 2016-06-13 DIAGNOSIS — E785 Hyperlipidemia, unspecified: Secondary | ICD-10-CM

## 2016-06-13 LAB — ROCKY MTN SPOTTED FVR ABS PNL(IGG+IGM)
RMSF IGM: NOT DETECTED
RMSF IgG: NOT DETECTED

## 2016-06-13 MED ORDER — METOPROLOL TARTRATE 50 MG PO TABS
25.0000 mg | ORAL_TABLET | Freq: Two times a day (BID) | ORAL | 0 refills | Status: DC
Start: 2016-06-13 — End: 2016-08-22

## 2016-06-13 MED ORDER — TIOTROPIUM BROMIDE MONOHYDRATE 2.5 MCG/ACT IN AERS: INHALATION_SPRAY | RESPIRATORY_TRACT | 0 refills | Status: DC

## 2016-06-13 MED ORDER — CETIRIZINE HCL 10 MG PO TABS: 10.0000 mg | ORAL_TABLET | Freq: Every day | ORAL | 6 refills | Status: DC

## 2016-06-13 MED ORDER — TRAZODONE HCL 50 MG PO TABS: 50.0000 mg | ORAL_TABLET | Freq: Every day | ORAL | 1 refills | Status: DC

## 2016-06-13 MED ORDER — POLYETHYLENE GLYCOL 3350 17 GM/SCOOP PO POWD: ORAL | 3 refills | Status: DC

## 2016-06-16 LAB — LYME ABY, WSTRN BLT IGG & IGM W/BANDS
B BURGDORFERI IGM ABS (IB): NEGATIVE
B burgdorferi IgG Abs (IB): NEGATIVE
LYME DISEASE 18 KD IGG: NONREACTIVE
LYME DISEASE 23 KD IGM: NONREACTIVE
LYME DISEASE 30 KD IGG: NONREACTIVE
LYME DISEASE 41 KD IGM: NONREACTIVE
LYME DISEASE 58 KD IGG: NONREACTIVE
Lyme Disease 23 kD IgG: NONREACTIVE
Lyme Disease 28 kD IgG: NONREACTIVE
Lyme Disease 39 kD IgG: NONREACTIVE
Lyme Disease 39 kD IgM: NONREACTIVE
Lyme Disease 41 kD IgG: NONREACTIVE
Lyme Disease 45 kD IgG: NONREACTIVE
Lyme Disease 66 kD IgG: NONREACTIVE
Lyme Disease 93 kD IgG: NONREACTIVE

## 2016-06-17 ENCOUNTER — Telehealth: Payer: Self-pay

## 2016-06-17 DIAGNOSIS — E785 Hyperlipidemia, unspecified: Secondary | ICD-10-CM

## 2016-06-17 DIAGNOSIS — I10 Essential (primary) hypertension: Secondary | ICD-10-CM

## 2016-06-17 MED ORDER — ROSUVASTATIN CALCIUM 20 MG PO TABS: 20.0000 mg | ORAL_TABLET | Freq: Every day | ORAL | 1 refills | Status: DC

## 2016-06-17 NOTE — Telephone Encounter (Signed)
Repeat labs ordered and Crestor ordered and sent to [facility] per patient preference

## 2016-06-17 NOTE — Telephone Encounter (Signed)
-----   Message from Fayrene Helper, MD sent at 06/12/2016  1:02 PM EDT ----- pls let her know all labs excellent EXCEPT cholesterol and review the results with her Let her know we will call if tick exposure recsults are ABNORMAL I have discussed with pharmacist, no established relationship between statin and hair loss, I recommend trying ANOTHER statin, since she associates lipito which she had been ion with hair loss Pls send crestor 20 mg daily x 4 months, if she agrees  Will need f/u and rept labs fasting cmp and lipid for that visit ?? pls ask, you will need to send in the med also pls

## 2016-06-30 ENCOUNTER — Ambulatory Visit (INDEPENDENT_AMBULATORY_CARE_PROVIDER_SITE_OTHER): Payer: Medicare Other | Admitting: Otolaryngology

## 2016-07-29 DIAGNOSIS — I480 Paroxysmal atrial fibrillation: Secondary | ICD-10-CM | POA: Diagnosis not present

## 2016-07-29 DIAGNOSIS — T50901A Poisoning by unspecified drugs, medicaments and biological substances, accidental (unintentional), initial encounter: Secondary | ICD-10-CM | POA: Diagnosis not present

## 2016-07-29 DIAGNOSIS — I2510 Atherosclerotic heart disease of native coronary artery without angina pectoris: Secondary | ICD-10-CM | POA: Diagnosis not present

## 2016-07-29 DIAGNOSIS — L658 Other specified nonscarring hair loss: Secondary | ICD-10-CM | POA: Diagnosis not present

## 2016-07-29 DIAGNOSIS — I10 Essential (primary) hypertension: Secondary | ICD-10-CM | POA: Diagnosis not present

## 2016-07-29 DIAGNOSIS — E782 Mixed hyperlipidemia: Secondary | ICD-10-CM | POA: Diagnosis not present

## 2016-07-29 DIAGNOSIS — I4891 Unspecified atrial fibrillation: Secondary | ICD-10-CM | POA: Diagnosis not present

## 2016-07-29 DIAGNOSIS — I25810 Atherosclerosis of coronary artery bypass graft(s) without angina pectoris: Secondary | ICD-10-CM | POA: Insufficient documentation

## 2016-08-04 ENCOUNTER — Ambulatory Visit (INDEPENDENT_AMBULATORY_CARE_PROVIDER_SITE_OTHER): Payer: Medicare Other | Admitting: Otolaryngology

## 2016-08-04 DIAGNOSIS — R040 Epistaxis: Secondary | ICD-10-CM

## 2016-08-20 ENCOUNTER — Other Ambulatory Visit: Payer: Self-pay | Admitting: Medical Oncology

## 2016-08-20 DIAGNOSIS — C3490 Malignant neoplasm of unspecified part of unspecified bronchus or lung: Secondary | ICD-10-CM

## 2016-08-21 ENCOUNTER — Other Ambulatory Visit (HOSPITAL_COMMUNITY)
Admission: RE | Admit: 2016-08-21 | Discharge: 2016-08-21 | Disposition: A | Discharge status: Home / Self Care | Payer: Medicare Other | Source: Ambulatory Visit | Attending: Internal Medicine | Admitting: Internal Medicine

## 2016-08-21 ENCOUNTER — Other Ambulatory Visit: Payer: Medicare Other

## 2016-08-21 ENCOUNTER — Ambulatory Visit (HOSPITAL_COMMUNITY)
Admission: RE | Admit: 2016-08-21 | Discharge: 2016-08-21 | Disposition: A | Discharge status: Home / Self Care | Payer: Medicare Other | Source: Ambulatory Visit | Attending: Internal Medicine | Admitting: Internal Medicine

## 2016-08-21 DIAGNOSIS — I700 Atherosclerosis of aorta: Secondary | ICD-10-CM | POA: Insufficient documentation

## 2016-08-21 DIAGNOSIS — Z902 Acquired absence of lung [part of]: Secondary | ICD-10-CM | POA: Diagnosis not present

## 2016-08-21 DIAGNOSIS — C3490 Malignant neoplasm of unspecified part of unspecified bronchus or lung: Secondary | ICD-10-CM | POA: Diagnosis not present

## 2016-08-21 DIAGNOSIS — Z85118 Personal history of other malignant neoplasm of bronchus and lung: Secondary | ICD-10-CM | POA: Diagnosis not present

## 2016-08-21 DIAGNOSIS — J439 Emphysema, unspecified: Secondary | ICD-10-CM | POA: Insufficient documentation

## 2016-08-21 DIAGNOSIS — I2510 Atherosclerotic heart disease of native coronary artery without angina pectoris: Secondary | ICD-10-CM | POA: Insufficient documentation

## 2016-08-21 DIAGNOSIS — Z08 Encounter for follow-up examination after completed treatment for malignant neoplasm: Secondary | ICD-10-CM | POA: Insufficient documentation

## 2016-08-21 DIAGNOSIS — C3491 Malignant neoplasm of unspecified part of right bronchus or lung: Secondary | ICD-10-CM

## 2016-08-21 LAB — COMPREHENSIVE METABOLIC PANEL
ALT: 18 U/L (ref 14–54)
AST: 22 U/L (ref 15–41)
Albumin: 3.9 g/dL (ref 3.5–5.0)
Alkaline Phosphatase: 55 U/L (ref 38–126)
Anion gap: 4 — ABNORMAL LOW (ref 5–15)
BUN: 12 mg/dL (ref 6–20)
CHLORIDE: 103 mmol/L (ref 101–111)
CO2: 30 mmol/L (ref 22–32)
CREATININE: 0.61 mg/dL (ref 0.44–1.00)
Calcium: 9.2 mg/dL (ref 8.9–10.3)
GFR calc Af Amer: >60 mL/min (ref >60)
GLUCOSE: 84 mg/dL (ref 65–99)
Potassium: 4.1 mmol/L (ref 3.5–5.1)
Sodium: 137 mmol/L (ref 135–145)
Total Bilirubin: 0.6 mg/dL (ref 0.3–1.2)
Total Protein: 6.9 g/dL (ref 6.5–8.1)

## 2016-08-21 LAB — CBC WITH DIFFERENTIAL/PLATELET
BASOS ABS: 0 10*3/uL (ref 0.0–0.1)
Basophils Relative: 0 %
EOS PCT: 3 %
Eosinophils Absolute: 0.3 10*3/uL (ref 0.0–0.7)
HCT: 41.8 % (ref 36.0–46.0)
Hemoglobin: 14 g/dL (ref 12.0–15.0)
LYMPHS PCT: 29 %
Lymphs Abs: 2.7 10*3/uL (ref 0.7–4.0)
MCH: 30.8 pg (ref 26.0–34.0)
MCHC: 33.5 g/dL (ref 30.0–36.0)
MCV: 91.9 fL (ref 78.0–100.0)
Monocytes Absolute: 0.9 10*3/uL (ref 0.1–1.0)
Monocytes Relative: 9 %
NEUTROS ABS: 5.5 10*3/uL (ref 1.7–7.7)
Neutrophils Relative %: 59 %
PLATELETS: 205 10*3/uL (ref 150–400)
RBC: 4.55 MIL/uL (ref 3.87–5.11)
RDW: 13.7 % (ref 11.5–15.5)
WBC: 9.4 10*3/uL (ref 4.0–10.5)

## 2016-08-21 LAB — POCT I-STAT CREATININE: CREATININE: 0.7 mg/dL (ref 0.44–1.00)

## 2016-08-21 MED ORDER — IOPAMIDOL (ISOVUE-300) INJECTION 61%
75.0000 mL | Freq: Once | INTRAVENOUS | Status: AC | PRN
  Administered 2016-08-21: 75 mL via INTRAVENOUS

## 2016-08-22 ENCOUNTER — Encounter (HOSPITAL_COMMUNITY): Payer: Self-pay | Admitting: Cardiology

## 2016-08-22 ENCOUNTER — Emergency Department (HOSPITAL_COMMUNITY)
Admission: EM | Admit: 2016-08-22 | Discharge: 2016-08-22 | Disposition: A | Discharge status: Home / Self Care | Payer: Medicare Other | Attending: Emergency Medicine | Admitting: Emergency Medicine

## 2016-08-22 DIAGNOSIS — Z85118 Personal history of other malignant neoplasm of bronchus and lung: Secondary | ICD-10-CM | POA: Insufficient documentation

## 2016-08-22 DIAGNOSIS — R040 Epistaxis: Secondary | ICD-10-CM

## 2016-08-22 DIAGNOSIS — F17210 Nicotine dependence, cigarettes, uncomplicated: Secondary | ICD-10-CM | POA: Diagnosis not present

## 2016-08-22 DIAGNOSIS — Z79899 Other long term (current) drug therapy: Secondary | ICD-10-CM | POA: Diagnosis not present

## 2016-08-22 DIAGNOSIS — J449 Chronic obstructive pulmonary disease, unspecified: Secondary | ICD-10-CM | POA: Diagnosis not present

## 2016-08-22 DIAGNOSIS — I10 Essential (primary) hypertension: Secondary | ICD-10-CM | POA: Insufficient documentation

## 2016-08-22 MED ORDER — OXYMETAZOLINE HCL 0.05 % NA SOLN
NASAL | Status: AC
  Filled 2016-08-22: qty 15

## 2016-08-22 NOTE — ED Notes (Signed)
Pt up walking around.  No visible nose bleed at this time.
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
## 2016-08-22 NOTE — ED Triage Notes (Signed)
Nose bleed since 1 pm

## 2016-08-22 NOTE — ED Notes (Signed)
Pt out in hall walking around.  No bleeding at this time.  States she is ok

## 2016-08-22 NOTE — ED Notes (Signed)
Pt has an active nosebleed.  Encouraging pt to hold pressure to nose.  Pt blowing nose.  Advised pt not to blow nose.

## 2016-08-25 ENCOUNTER — Ambulatory Visit (INDEPENDENT_AMBULATORY_CARE_PROVIDER_SITE_OTHER): Payer: Medicare Other | Admitting: Otolaryngology

## 2016-08-25 DIAGNOSIS — R040 Epistaxis: Secondary | ICD-10-CM

## 2016-08-27 DIAGNOSIS — Z79891 Long term (current) use of opiate analgesic: Secondary | ICD-10-CM | POA: Diagnosis not present

## 2016-08-27 DIAGNOSIS — M1990 Unspecified osteoarthritis, unspecified site: Secondary | ICD-10-CM | POA: Diagnosis not present

## 2016-08-27 DIAGNOSIS — J9611 Chronic respiratory failure with hypoxia: Secondary | ICD-10-CM | POA: Diagnosis not present

## 2016-08-27 DIAGNOSIS — H9190 Unspecified hearing loss, unspecified ear: Secondary | ICD-10-CM | POA: Diagnosis not present

## 2016-08-27 DIAGNOSIS — F411 Generalized anxiety disorder: Secondary | ICD-10-CM | POA: Diagnosis not present

## 2016-08-27 DIAGNOSIS — J449 Chronic obstructive pulmonary disease, unspecified: Secondary | ICD-10-CM | POA: Diagnosis not present

## 2016-08-27 DIAGNOSIS — M25571 Pain in right ankle and joints of right foot: Secondary | ICD-10-CM | POA: Diagnosis not present

## 2016-08-27 DIAGNOSIS — M62838 Other muscle spasm: Secondary | ICD-10-CM | POA: Diagnosis not present

## 2016-08-27 DIAGNOSIS — G8922 Chronic post-thoracotomy pain: Secondary | ICD-10-CM | POA: Diagnosis not present

## 2016-08-27 DIAGNOSIS — C3491 Malignant neoplasm of unspecified part of right bronchus or lung: Secondary | ICD-10-CM | POA: Diagnosis not present

## 2016-08-27 DIAGNOSIS — G4733 Obstructive sleep apnea (adult) (pediatric): Secondary | ICD-10-CM | POA: Diagnosis not present

## 2016-08-28 ENCOUNTER — Encounter: Payer: Self-pay | Admitting: Internal Medicine

## 2016-08-28 ENCOUNTER — Telehealth: Payer: Self-pay | Admitting: Internal Medicine

## 2016-08-28 ENCOUNTER — Ambulatory Visit (HOSPITAL_BASED_OUTPATIENT_CLINIC_OR_DEPARTMENT_OTHER): Payer: Medicare Other | Admitting: Internal Medicine

## 2016-08-28 VITALS — BP 123/69 | HR 59 | Temp 98.1°F | Resp 17 | Ht 63.0 in | Wt 136.5 lb

## 2016-08-28 DIAGNOSIS — C3431 Malignant neoplasm of lower lobe, right bronchus or lung: Secondary | ICD-10-CM | POA: Diagnosis not present

## 2016-08-28 DIAGNOSIS — C3491 Malignant neoplasm of unspecified part of right bronchus or lung: Secondary | ICD-10-CM

## 2016-08-28 NOTE — Telephone Encounter (Signed)
OK. Changed her lab and scan to [facility]. Thank you.

## 2016-08-28 NOTE — Telephone Encounter (Signed)
Gave patient avs report and appointments for December. Patient would like to have lab and ct at [facility].   Left message for desk nurse re faxing lab order/script to [facility] for patient to have six month lab draw at [facility] - patient aware.   Confirmed with Peggy at [facility] radiology that [facility] radiology now schedules:  [facility]  [facility] [facility])  Central radiology will contact patient re scan - patient aware.
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
## 2016-08-28 NOTE — Addendum Note (Signed)
Addended by: Curt Bears on: 08/28/2016 09:39 AM   Modules accepted: Orders

## 2016-09-25 ENCOUNTER — Ambulatory Visit (INDEPENDENT_AMBULATORY_CARE_PROVIDER_SITE_OTHER): Payer: Medicare Other | Admitting: Otolaryngology

## 2016-09-25 DIAGNOSIS — R040 Epistaxis: Secondary | ICD-10-CM | POA: Diagnosis not present

## 2016-10-18 DIAGNOSIS — Z23 Encounter for immunization: Secondary | ICD-10-CM | POA: Diagnosis not present

## 2016-10-27 DIAGNOSIS — M62838 Other muscle spasm: Secondary | ICD-10-CM | POA: Diagnosis not present

## 2016-10-27 DIAGNOSIS — G8922 Chronic post-thoracotomy pain: Secondary | ICD-10-CM | POA: Diagnosis not present

## 2016-10-27 DIAGNOSIS — Z79899 Other long term (current) drug therapy: Secondary | ICD-10-CM | POA: Diagnosis not present

## 2016-10-27 DIAGNOSIS — J449 Chronic obstructive pulmonary disease, unspecified: Secondary | ICD-10-CM | POA: Diagnosis not present

## 2016-10-27 DIAGNOSIS — M792 Neuralgia and neuritis, unspecified: Secondary | ICD-10-CM | POA: Diagnosis not present

## 2016-10-27 DIAGNOSIS — E7800 Pure hypercholesterolemia, unspecified: Secondary | ICD-10-CM | POA: Diagnosis not present

## 2016-10-27 DIAGNOSIS — M1990 Unspecified osteoarthritis, unspecified site: Secondary | ICD-10-CM | POA: Diagnosis not present

## 2016-10-27 DIAGNOSIS — M25571 Pain in right ankle and joints of right foot: Secondary | ICD-10-CM | POA: Diagnosis not present

## 2016-10-27 DIAGNOSIS — F411 Generalized anxiety disorder: Secondary | ICD-10-CM | POA: Diagnosis not present

## 2016-10-27 DIAGNOSIS — E785 Hyperlipidemia, unspecified: Secondary | ICD-10-CM | POA: Diagnosis not present

## 2016-10-27 DIAGNOSIS — R260 Ataxic gait: Secondary | ICD-10-CM | POA: Diagnosis not present

## 2016-10-27 DIAGNOSIS — G4733 Obstructive sleep apnea (adult) (pediatric): Secondary | ICD-10-CM | POA: Diagnosis not present

## 2016-11-12 ENCOUNTER — Ambulatory Visit: Payer: Medicare Other | Admitting: Family Medicine

## 2016-11-12 DIAGNOSIS — H00029 Hordeolum internum unspecified eye, unspecified eyelid: Secondary | ICD-10-CM | POA: Diagnosis not present

## 2016-11-19 ENCOUNTER — Ambulatory Visit (INDEPENDENT_AMBULATORY_CARE_PROVIDER_SITE_OTHER): Payer: Medicare Other | Admitting: Family Medicine

## 2016-11-19 ENCOUNTER — Encounter: Payer: Self-pay | Admitting: Family Medicine

## 2016-11-19 VITALS — BP 114/78 | HR 73 | Resp 16 | Ht 63.0 in | Wt 144.0 lb

## 2016-11-19 DIAGNOSIS — F17210 Nicotine dependence, cigarettes, uncomplicated: Secondary | ICD-10-CM

## 2016-11-19 DIAGNOSIS — I10 Essential (primary) hypertension: Secondary | ICD-10-CM

## 2016-11-19 DIAGNOSIS — F411 Generalized anxiety disorder: Secondary | ICD-10-CM | POA: Diagnosis not present

## 2016-11-19 DIAGNOSIS — H9193 Unspecified hearing loss, bilateral: Secondary | ICD-10-CM

## 2016-11-19 DIAGNOSIS — I471 Supraventricular tachycardia, unspecified: Secondary | ICD-10-CM

## 2016-11-19 DIAGNOSIS — G4734 Idiopathic sleep related nonobstructive alveolar hypoventilation: Secondary | ICD-10-CM | POA: Diagnosis not present

## 2016-11-19 DIAGNOSIS — M7989 Other specified soft tissue disorders: Secondary | ICD-10-CM

## 2016-11-19 DIAGNOSIS — Z1231 Encounter for screening mammogram for malignant neoplasm of breast: Secondary | ICD-10-CM

## 2016-11-19 DIAGNOSIS — G894 Chronic pain syndrome: Secondary | ICD-10-CM

## 2016-11-19 DIAGNOSIS — M79675 Pain in left toe(s): Secondary | ICD-10-CM | POA: Insufficient documentation

## 2016-11-19 DIAGNOSIS — E782 Mixed hyperlipidemia: Secondary | ICD-10-CM | POA: Diagnosis not present

## 2016-11-19 DIAGNOSIS — F17200 Nicotine dependence, unspecified, uncomplicated: Secondary | ICD-10-CM

## 2016-11-19 DIAGNOSIS — Z1239 Encounter for other screening for malignant neoplasm of breast: Secondary | ICD-10-CM

## 2016-11-19 LAB — COMPREHENSIVE METABOLIC PANEL
ALK PHOS: 58 U/L (ref 33–130)
ALT: 14 U/L (ref 6–29)
AST: 20 U/L (ref 10–35)
Albumin: 4 g/dL (ref 3.6–5.1)
BUN: 13 mg/dL (ref 7–25)
CALCIUM: 9.3 mg/dL (ref 8.6–10.4)
CO2: 24 mmol/L (ref 20–31)
Chloride: 106 mmol/L (ref 98–110)
Creat: 0.67 mg/dL (ref 0.50–0.99)
GLUCOSE: 91 mg/dL (ref 65–99)
POTASSIUM: 4.6 mmol/L (ref 3.5–5.3)
Sodium: 143 mmol/L (ref 135–146)
Total Bilirubin: 0.3 mg/dL (ref 0.2–1.2)
Total Protein: 6.5 g/dL (ref 6.1–8.1)

## 2016-11-19 LAB — LIPID PANEL
CHOL/HDL RATIO: 4.7 ratio (ref <5.0)
Cholesterol: 276 mg/dL — ABNORMAL HIGH (ref <200)
HDL: 59 mg/dL (ref >50)
LDL Cholesterol: 190 mg/dL — ABNORMAL HIGH (ref <100)
TRIGLYCERIDES: 136 mg/dL (ref <150)
VLDL: 27 mg/dL (ref <30)

## 2016-11-19 NOTE — Patient Instructions (Addendum)
Wellness in 4 .5 month, call if you need me sooner  Labs today fasting  Referred for hearing, mammogram and podiatry  Will address oxygen needs  Please work on quitting smoking  Call for appt info for lung scan  Thank you  for choosing [facility]. We consider it a privelige to serve you.  Delivering excellent health care in a caring and  compassionate way is our goal.  Partnering with you,  so that together we can achieve this goal is our strategy.

## 2016-11-19 NOTE — Assessment & Plan Note (Signed)
One year h/o increased pain and swelling of left great toe, needs to see podiatry

## 2016-11-19 NOTE — Progress Notes (Signed)
[name]     MRN: [id number]      DOB: June 01, 1951   HPI [name] is here for follow up and re-evaluation of chronic medical conditions, medication management and review of any available recent lab and radiology data.  Preventive health is updated, specifically  Cancer screening and Immunization.   Needs mammogram will be referred Has been seen and given good report by oncology and by cardiology since her last visit Still feels as though eyes are sticky with excess oil, using compresses, no vision change ahs recently been to eye doc C/o bilateral hearing loss wants re evaluation Wants re testing for oxygen need, to qualify, also had eqipment issue about 2 to 101month, when she heard an alarm in her house from the oxygen machine which was hot, states she has not used it since that time Still smoking, increased to 1 PPD and unwilling to commiting to quitting now. Ongoing stress as far as having a roommate whose health is poor and whose spouse who is absent for the most part, driving a truck, irritates [place] significantly C/o left great toe pain, tenderness and mild swelling, requests Podiatry eval and management  ROS Denies recent fever or chills. Denies sinus pressure, nasal congestion, ear pain or sore throat. Denies chest congestion, productive cough or wheezing. Denies chest pains, palpitations and leg swelling Denies abdominal pain, nausea, vomiting,diarrhea or constipation.   Denies dysuria, frequency, hesitancy or incontinence. Denies uncontrolled  joint pain, swelling and limitation in mobility.Treated by neurology Denies uncontrolled headaches, seizures, numbness, or tingling. Denies depression, uncontrolled anxiety or insomnia. Denies skin break down or rash.   PE  BP 114/78   Pulse 73   Resp 16   Ht '5\' 3"'$  (1.6 m)   Wt 144 lb (65.3 kg)   SpO2 95%   BMI 25.51 kg/m   Patient alert and oriented and in no cardiopulmonary distress.  HEENT: No facial asymmetry,  EOMI,   oropharynx pink and moist.  Neck supple no JVD, no mass.  Chest: Clear to auscultation bilaterally.Decreased though adequate air entry  CVS: S1, S2 no murmurs, no S3.Regular rate.  ABD: Soft non tender.   Ext: No edema  MS: Adequate though reduced  ROM spine, shoulders, hips and knees. Left great toe slightly swollen not  Warm, no drainage with mildly ingrown toenail  Skin: Intact, no ulcerations or rash noted.  Psych: Good eye contact, normal affect. Memory intact not anxious or depressed appearing.  CNS: CN 2-12 intact, power,  normal throughout.no focal deficits noted.   Assessment & Plan  Pain and swelling of toe, left One year h/o increased pain and swelling of left great toe, needs to see podiatry  Essential hypertension Controlled, no change in medication DASH diet and commitment to daily physical activity for a minimum of 30 minutes discussed and encouraged, as a part of hypertension management. The importance of attaining a healthy weight is also discussed.  BP/Weight 11/19/2016 08/28/2016 08/22/2016 06/11/2016 02/19/2016 02/14/2016 34/69/6295 Systolic BP [id number]  Diastolic BP 78 69 94 60 71 66 59  Wt. (Lbs) 144 136.5 145 136.12 147 147.8 144  BMI 25.51 24.18 25.69 24.11 26.05 26.19 25.51       Chronic pain syndrome Managed by neurology and reports good control  NICOTINE ADDICTION Patient counseled for approximately 5 minutes regarding the health risks of ongoing nicotine use, specifically all types of cancer, heart disease, stroke and respiratory failure. The options available for  help with cessation ,the behavioral changes to assist the process, and the option to either gradully reduce usage  Or abruptly stop.is also discussed. Pt is also encouraged to set specific goals in number of cigarettes used daily, as well as to set a quit date.     Paroxysmal supraventricular tachycardia Controlled, no change in medication   Paroxysmal SVT  (supraventricular tachycardia) Controlled, no change in medication Had cardiology re eval last Fall , and is stable , annual follow up  Nocturnal hypoxia Needs equipment checked as reports concerns re safety, has established need for supplemental oxygen while sleeping, states has not used equipment for over 7 months due to safety concerns, will request supplier to test  Hearing loss C/o increased loss in both ears , left more than right refer to ENT  GAD (generalized anxiety disorder) Improved and stable, no  Change in management

## 2016-11-20 NOTE — Assessment & Plan Note (Signed)

## 2016-11-20 NOTE — Assessment & Plan Note (Signed)
Controlled, no change in medication DASH diet and commitment to daily physical activity for a minimum of 30 minutes discussed and encouraged, as a part of hypertension management. The importance of attaining a healthy weight is also discussed.  BP/Weight 11/19/2016 08/28/2016 08/22/2016 06/11/2016 02/19/2016 02/14/2016 05/07/3661  Systolic BP 947 654 650 354 99 656 97  Diastolic BP 78 69 94 60 71 66 59  Wt. (Lbs) 144 136.5 145 136.12 147 147.8 144  BMI 25.51 24.18 25.69 24.11 26.05 26.19 25.51

## 2016-11-20 NOTE — Assessment & Plan Note (Signed)
Controlled, no change in medication Had cardiology re eval last Fall , and is stable , annual follow up

## 2016-11-20 NOTE — Assessment & Plan Note (Signed)
Managed by neurology and reports good control

## 2016-11-20 NOTE — Assessment & Plan Note (Signed)
Needs equipment checked as reports concerns re safety, has established need for supplemental oxygen while sleeping, states has not used equipment for over 7 months due to safety concerns, will request supplier to test

## 2016-11-20 NOTE — Assessment & Plan Note (Signed)
C/o increased loss in both ears , left more than right refer to ENT

## 2016-11-20 NOTE — Assessment & Plan Note (Addendum)
Improved and stable, no  Change in management

## 2016-11-20 NOTE — Assessment & Plan Note (Signed)
Controlled, no change in medication  

## 2016-11-21 ENCOUNTER — Telehealth: Payer: Self-pay

## 2016-11-21 DIAGNOSIS — E782 Mixed hyperlipidemia: Secondary | ICD-10-CM

## 2016-11-21 DIAGNOSIS — I10 Essential (primary) hypertension: Secondary | ICD-10-CM

## 2016-11-21 DIAGNOSIS — E559 Vitamin D deficiency, unspecified: Secondary | ICD-10-CM

## 2016-11-21 NOTE — Telephone Encounter (Signed)
-----   Message from Fayrene Helper, MD sent at 11/20/2016  3:57 AM EST ----- pls advise and review the fact that cholesterol is improved overall , and kidney and liver function are normal Needs rept lipid and chem 7 and TSH and vit D fasting in 5.5 months and appt with me for f/u at that time pls

## 2016-11-28 ENCOUNTER — Telehealth: Payer: Self-pay

## 2016-11-28 DIAGNOSIS — R7301 Impaired fasting glucose: Secondary | ICD-10-CM

## 2016-11-28 DIAGNOSIS — E559 Vitamin D deficiency, unspecified: Secondary | ICD-10-CM

## 2016-11-28 DIAGNOSIS — E782 Mixed hyperlipidemia: Secondary | ICD-10-CM

## 2016-11-28 DIAGNOSIS — I10 Essential (primary) hypertension: Secondary | ICD-10-CM

## 2016-11-28 NOTE — Telephone Encounter (Signed)
-----   Message from Fayrene Helper, MD sent at 11/20/2016  3:57 AM EST ----- pls advise and review the fact that cholesterol is improved overall , and kidney and liver function are normal Needs rept lipid and chem 7 and TSH and vit D fasting in 5.5 months and appt with me for f/u at that time pls

## 2016-12-01 ENCOUNTER — Ambulatory Visit (INDEPENDENT_AMBULATORY_CARE_PROVIDER_SITE_OTHER): Payer: Medicare Other | Admitting: Otolaryngology

## 2016-12-01 DIAGNOSIS — H903 Sensorineural hearing loss, bilateral: Secondary | ICD-10-CM | POA: Diagnosis not present

## 2016-12-01 DIAGNOSIS — H9313 Tinnitus, bilateral: Secondary | ICD-10-CM | POA: Diagnosis not present

## 2016-12-03 DIAGNOSIS — H16143 Punctate keratitis, bilateral: Secondary | ICD-10-CM | POA: Diagnosis not present

## 2016-12-19 NOTE — Addendum Note (Signed)
Addended by: Denman George B on: 12/19/2016 02:30 PM   Modules accepted: Orders

## 2017-01-07 ENCOUNTER — Ambulatory Visit (INDEPENDENT_AMBULATORY_CARE_PROVIDER_SITE_OTHER): Payer: Medicare Other | Admitting: Podiatry

## 2017-01-07 VITALS — BP 127/64 | HR 65 | Resp 16

## 2017-01-07 DIAGNOSIS — L600 Ingrowing nail: Secondary | ICD-10-CM | POA: Diagnosis not present

## 2017-01-07 MED ORDER — NEOMYCIN-POLYMYXIN-HC 3.5-10000-1 OT SOLN: OTIC | 0 refills | Status: DC

## 2017-01-07 NOTE — Progress Notes (Signed)
She presents today with a chief complaint of ingrown toenail times a past two-year tibial border hallux left. Had the hallux right corrected many years ago and is doing great.  Objective: Vital signs are stable she is alert and oriented 3. Pulses are palpable. Neurologic sensorium is intact. Degenerative reflexes are intact. Sharp incurvated nail margin along the tibial border hallux left.  Assessment: Ingrown nail tibial border hallux left.  Plan: Chemical matrixectomy was performed today after local anesthesia was administered. She tolerated this procedure well. Prescription for Cortisporin Otic was applied. She is provided with both oral and written home-going instructions for care and soaking of the toe I will follow up with her in 2 weeks.

## 2017-01-07 NOTE — Patient Instructions (Addendum)

## 2017-01-08 ENCOUNTER — Telehealth: Payer: Self-pay | Admitting: *Deleted

## 2017-01-08 MED ORDER — NEOMYCIN-POLYMYXIN-HC 3.5-10000-1 OT SOLN
OTIC | 0 refills | Status: DC
Start: 2017-01-08 — End: 2017-04-01

## 2017-01-08 NOTE — Telephone Encounter (Signed)
Pt states her prescription was not sent to her pharmacy, and she did not get instructions, and her toe is very painful. I asked pt her pharmacy , she states uses [facility]. I change pharmacy to [facility] and reorder cortisporin otic solution. I instructed pt to begin soaks and remove the dressing,which would decrease the pain. Pt states she doesn't have any supplies to soak. I told her to use 1C white vinegar or 1/2 C epsom salt in 1 qt warm water for 20 minutes and cover with a neosporin bandaid until her next soak, then use the cortisporin drops. I told pt she would do the soaks twice a day for 4-6 week, and at the end of the 4th week do the last soak of the day and leave off the drops and the bandaid, if the area got a dry, hard scab without redness, swelling, drainage or pain she could stop the soaks and dressings. I told the pt if the area did not get a dry, hard scab or if continued to have redness, drainage, swelling or pain then continue the  soaks and test again in 2 weeks. Pt states she was told to soak in betadine. I asked pt if she had betadine and she said no. I told her if her if she did want to use betadine, use 1 tablespoon in 1 qt of warm water. Pt asked if she should rinse the betadine, I told her only if it stained her foot. I told pt the toe would have redness, swelling, drainage and pain of varying degrees for the next 4-6 weeks but symptoms should decrease the further from the procedure she progressed. Pt states she is having a lot of pain and is in pain management, and is allotted a certain amount of Vicodin a day for her ankle. I told pt if she was not able to take ibuprofen, or tylenol, she would need to discuss her pain management with her pain management doctor. Pt states understanding.

## 2017-01-12 DIAGNOSIS — Z1231 Encounter for screening mammogram for malignant neoplasm of breast: Secondary | ICD-10-CM | POA: Diagnosis not present

## 2017-01-12 DIAGNOSIS — M79671 Pain in right foot: Secondary | ICD-10-CM | POA: Diagnosis not present

## 2017-01-12 LAB — HM MAMMOGRAPHY: HM Mammogram: NORMAL (ref 0–4)

## 2017-01-14 DIAGNOSIS — H00029 Hordeolum internum unspecified eye, unspecified eyelid: Secondary | ICD-10-CM | POA: Diagnosis not present

## 2017-01-21 ENCOUNTER — Ambulatory Visit (INDEPENDENT_AMBULATORY_CARE_PROVIDER_SITE_OTHER): Payer: Medicare Other | Admitting: Podiatry

## 2017-01-21 DIAGNOSIS — L600 Ingrowing nail: Secondary | ICD-10-CM

## 2017-01-21 NOTE — Progress Notes (Signed)
She presents today for follow-up of matrixectomy hallux left. Tibial border appears to be healing very night she says. Continues to use Betadine in warm water.  Objective: R signs are stable alert and oriented 3. Pulses are palpable. Neurologic sensorium is intact. Deep tendon reflexes are intact. Margins appear to be healing very nicely no signs of infection.  Assessment: Well-healing surgical toe hallux left.  Plan: Continue to soak in Epsom salts and warm water until completely resolved. Covered in the day and leave open at bedtime.

## 2017-01-26 DIAGNOSIS — E782 Mixed hyperlipidemia: Secondary | ICD-10-CM | POA: Diagnosis not present

## 2017-01-26 DIAGNOSIS — I10 Essential (primary) hypertension: Secondary | ICD-10-CM | POA: Diagnosis not present

## 2017-01-26 DIAGNOSIS — I70219 Atherosclerosis of native arteries of extremities with intermittent claudication, unspecified extremity: Secondary | ICD-10-CM | POA: Insufficient documentation

## 2017-01-26 DIAGNOSIS — I2510 Atherosclerotic heart disease of native coronary artery without angina pectoris: Secondary | ICD-10-CM | POA: Diagnosis not present

## 2017-01-26 DIAGNOSIS — I480 Paroxysmal atrial fibrillation: Secondary | ICD-10-CM | POA: Diagnosis not present

## 2017-02-02 ENCOUNTER — Encounter: Payer: Self-pay | Admitting: Family Medicine

## 2017-02-02 ENCOUNTER — Ambulatory Visit (INDEPENDENT_AMBULATORY_CARE_PROVIDER_SITE_OTHER): Payer: Medicare Other | Admitting: Family Medicine

## 2017-02-02 VITALS — BP 128/74 | HR 64 | Temp 97.8°F | Resp 20 | Ht 63.0 in | Wt 142.0 lb

## 2017-02-02 DIAGNOSIS — M26621 Arthralgia of right temporomandibular joint: Secondary | ICD-10-CM

## 2017-02-02 DIAGNOSIS — H04129 Dry eye syndrome of unspecified lacrimal gland: Secondary | ICD-10-CM

## 2017-02-02 MED ORDER — CYCLOSPORINE 0.05 % OP EMUL: 1.0000 [drp] | Freq: Two times a day (BID) | OPHTHALMIC | 1 refills | Status: DC

## 2017-02-02 NOTE — Patient Instructions (Addendum)
Try the restasis for your eyes See eye doctor if this does not help  Temporomandibular Joint Syndrome Temporomandibular joint (TMJ) syndrome is a condition that affects the joints between your jaw and your skull. The TMJs are located near your ears and allow your jaw to open and close. These joints and the nearby muscles are involved in all movements of the jaw. People with TMJ syndrome have pain in the area of these joints and muscles. Chewing, biting, or other movements of the jaw can be difficult or painful. TMJ syndrome can be caused by various things. In many cases, the condition is mild and goes away within a few weeks. For some people, the condition can become a long-term problem. What are the causes? Possible causes of TMJ syndrome include:  Grinding your teeth or clenching your jaw. Some people do this when they are under stress.  Arthritis.  Injury to the jaw.  Head or neck injury.  Teeth or dentures that are not aligned well. In some cases, the cause of TMJ syndrome may not be known. What are the signs or symptoms? The most common symptom is an aching pain on the side of the head in the area of the TMJ. Other symptoms may include:  Pain when moving your jaw, such as when chewing or biting.  Being unable to open your jaw all the way.  Making a clicking sound when you open your mouth.  Headache.  Earache.  Neck or shoulder pain. How is this diagnosed? Diagnosis can usually be made based on your symptoms, your medical history, and a physical exam. Your health care provider may check the range of motion of your jaw. Imaging tests, such as X-rays or an MRI, are sometimes done. You may need to see your dentist to determine if your teeth and jaw are lined up correctly. How is this treated? TMJ syndrome often goes away on its own. If treatment is needed, the options may include:  Eating soft foods and applying ice or heat.  Medicines to relieve pain or  inflammation.  Medicines to relax the muscles.  A splint, bite plate, or mouthpiece to prevent teeth grinding or jaw clenching.  Relaxation techniques or counseling to help reduce stress.  Transcutaneous electrical nerve stimulation (TENS). This helps to relieve pain by applying an electrical current through the skin.  Acupuncture. This is sometimes helpful to relieve pain.  Jaw surgery. This is rarely needed. Follow these instructions at home:  Take medicines only as directed by your health care provider.  Eat a soft diet if you are having trouble chewing.  Apply ice to the painful area.  Put ice in a plastic bag.  Place a towel between your skin and the bag.  Leave the ice on for 20 minutes, 2-3 times a day.  Apply a warm compress to the painful area as directed.  Massage your jaw area and perform any jaw stretching exercises as recommended by your health care provider.  If you were given a mouthpiece or bite plate, wear it as directed.  Avoid foods that require a lot of chewing. Do not chew gum.  Keep all follow-up visits as directed by your health care provider. This is important. Contact a health care provider if:  You are having trouble eating.  You have new or worsening symptoms. Get help right away if:  Your jaw locks open or closed. This information is not intended to replace advice given to you by your health care provider. Make sure  you discuss any questions you have with your health care provider. Document Released: 07/22/2001 Document Revised: 06/26/2016 Document Reviewed: 06/01/2014 Elsevier Interactive Patient Education  2017 [employer].

## 2017-02-02 NOTE — Progress Notes (Signed)
Chief Complaint  Patient presents with  . Eye Problem  . Jaw Pain   Here with two issues First she says she bit on something hard and felt a pop and pain in her left jaw that has persisted.  Severe pain improved but still sore.  Has not had any more popping.  Never dx with osteoporosis or taken fosamax.  No previous jaw problems Second, she seems obsessed with her eyes.  Has used numerous drops.  Has seen multiple eye doctors.  Has special tweezers she uses to press on her lids to "express oil".  She has pulled out most of her lashes with the constant rubbing and drops and tweezing.  They still feel like she has sand in them all the time   No visual disturbance.  No redness or crusting. Patient Active Problem List   Diagnosis Date Noted  . Atherosclerotic peripheral vascular disease with intermittent claudication ([place]) 01/26/2017  . Vitamin D deficiency 11/21/2016  . Pain and swelling of toe, left 11/19/2016  . CAD (coronary artery disease) 07/29/2016  . Reduced vision 12/16/2015  . Hair loss disorder 12/12/2015  . GAD (generalized anxiety disorder) 12/12/2015  . Shoulder pain, right 09/05/2015  . S/P thoracotomy 07/25/2015  . Squamous cell carcinoma of right lung ([place]) 07/25/2015  . Paroxysmal a-fib ([place]) 07/10/2015  . Sleep apnea 07/02/2015  . Depression with anxiety 07/01/2015  . Lung cancer ([place]) 06/27/2015  . Nocturnal hypoxia 12/28/2013  . Centrilobular emphysema ([place]) 12/18/2013  . AVNRT (AV nodal re-entry tachycardia) ([place]) 10/20/2013  . Paroxysmal supraventricular tachycardia ([place]) 10/20/2013  . Paroxysmal SVT (supraventricular tachycardia) ([place]) 10/10/2013  . Unspecified constipation 06/22/2013  . Chronic pain syndrome 02/02/2013  . Ankle arthritis 10/27/2012  . Hearing loss 09/21/2012  . Bleeding disorder ([place]) 09/21/2012  . Hypoxemia requiring supplemental oxygen 03/28/2012  . Acute GI bleeding 01/28/2012  . 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  Diverticulosis 01/28/2012  . Hematochezia 01/27/2012  . History of epistaxis 10/17/2011  . IMPAIRED FASTING GLUCOSE 07/22/2010  . DERMATITIS 06/24/2010  . ARTHRITIS, RIGHT ANKLE 04/15/2010  . CHEST PAIN UNSPECIFIED 12/06/2009  . NECK PAIN, CHRONIC 10/22/2009  . NUMBNESS, HAND 10/22/2009  . ALLERGIC REACTION 09/12/2009  . GLUCOSE INTOLERANCE 04/17/2009  . POLYARTHRITIS 04/11/2009  . MYOSITIS 04/11/2009  . ANXIETY STATE, UNSPECIFIED 12/14/2008  . Insomnia 12/14/2008  . HEPATITIS C 12/06/2008  . ALCOHOL ABUSE, IN REMISSION 12/06/2008  . NICOTINE ADDICTION 12/06/2008  . Essential hypertension 12/06/2008  . LEG PAIN, RIGHT 12/06/2008  . FATIGUE 12/06/2008  . Hyperlipemia 09/18/2008  . GERD 09/18/2008  . HIATAL HERNIA 09/18/2008  . OSA (obstructive sleep apnea) 09/18/2008  . DYSPHAGIA UNSPECIFIED 09/18/2008  . HEPATITIS C, HX OF 09/18/2008    Outpatient Encounter Prescriptions as of 02/02/2017  Medication Sig  . carisoprodol (SOMA) 350 MG tablet Take 350 mg by mouth 2 (two) times daily as needed for muscle spasms.  . cetirizine (ZYRTEC) 10 MG tablet Take 1 tablet (10 mg total) by mouth daily. (Patient taking differently: Take 10 mg by mouth daily as needed for allergies. )  . clopidogrel (PLAVIX) 75 MG tablet Take 37.5 mg by mouth daily.  Marland Kitchen EPIPEN 2-PAK 0.3 MG/0.3ML DEVI USE AS DIRECTED  . flecainide (TAMBOCOR) 150 MG tablet Take 150 mg by mouth 2 (two) times daily.  Marland Kitchen HYDROcodone-acetaminophen (NORCO) 10-325 MG tablet Take 1 tablet by mouth 3 (three) times daily.   . Iodine, Kelp, (KELP PO) Take 1 tablet by mouth daily.  Marland Kitchen  LYRICA 150 MG capsule Take 150 mg by mouth 3 (three) times daily.   . metoprolol succinate (TOPROL-XL) 50 MG 24 hr tablet Take 25 mg by mouth daily.   . Misc Natural Products (TART CHERRY ADVANCED) CAPS Take 1 capsule by mouth daily.   Marland Kitchen neomycin-polymyxin-hydrocortisone (CORTISPORIN) otic solution Apply one to two drops to toe after soaking twice daily.  .  polyethylene glycol powder (GLYCOLAX/MIRALAX) powder 17 g in water once daily (Patient taking differently: Take 17 g by mouth daily. )  . rosuvastatin (CRESTOR) 20 MG tablet Take 1 tablet (20 mg total) by mouth daily.  . Tiotropium Bromide Monohydrate (SPIRIVA RESPIMAT) 2.5 MCG/ACT AERS INHALE 2 PUFFS EVERY DAY  . traZODone (DESYREL) 50 MG tablet Take 1 tablet (50 mg total) by mouth at bedtime.  . cycloSPORINE (RESTASIS) 0.05 % ophthalmic emulsion Place 1 drop into both eyes 2 (two) times daily.  Marland Kitchen XIIDRA 5 % SOLN    No facility-administered encounter medications on file as of 02/02/2017.     Allergies  Allergen Reactions  . Aspirin Hives and Itching  . Diphenhydramine Hcl Other (See Comments), Swelling and Hives  . Salicin Swelling and Hives  . Tramadol Hcl Other (See Comments)    Lowers BP  . Willow Bark [White Willow Bark] Hives, Itching and Swelling    Requires EPI PEN. Swelling of throat, tongue.   Leta Speller Leaf Swallow Wort Rhizome Hives, Itching and Swelling    Requires EPI PEN, Welling of throat, tongue.  . Benadryl [Diphenhydramine Hcl] Itching    Makes patient fell hyper.  . Codeine     Patient also states she is allergic to steroids.  Marland Kitchen Cymbalta [Duloxetine Hcl] Other (See Comments)    Agitation, poor sleep  . Other Other (See Comments)    All steroids - makes blood pressure drop and she feels like she is bottoming out  . Prednisone     All steroids  . Promethazine Other (See Comments)  . Tramadol Other (See Comments)  . Fluorometholone Swelling    Eye drops caused lid swelling and itching  . Promethazine Hcl Anxiety    Pt. States hallucinations and anxiety  . Wellbutrin [Bupropion] Nausea Only    Review of Systems  Constitutional: Negative for activity change and appetite change.  HENT: Negative for congestion, dental problem and ear pain.   Eyes: Positive for itching. Negative for discharge, redness and visual disturbance.  Musculoskeletal: Positive for  arthralgias and back pain.       Chronic  Skin:       Hair thinning  Psychiatric/Behavioral: Positive for dysphoric mood and sleep disturbance. The patient is nervous/anxious.        Home situation    BP 128/74 (BP Location: Right Arm, Patient Position: Sitting, Cuff Size: Normal)   Pulse 64   Temp 97.8 F (36.6 C) (Temporal)   Resp 20   Ht '5\' 3"'$  (1.6 m)   Wt 142 lb (64.4 kg)   SpO2 99%   BMI 25.15 kg/m   Physical Exam  Constitutional: She is oriented to person, place, and time. She appears well-developed and well-nourished. No distress.  Nervous .  upset  HENT:  Head: Normocephalic and atraumatic.  Right Ear: External ear normal.  Left Ear: External ear normal.  Mouth/Throat: Oropharynx is clear and moist.  Mild-mod tenderness L TMJ.  No click  Eyes: Conjunctivae and EOM are normal. Pupils are equal, round, and reactive to light.  Eyes look normal, slightly dry  Neck:  Normal range of motion. Neck supple.  Cardiovascular: Normal rate, regular rhythm and normal heart sounds.   Pulmonary/Chest: Effort normal and breath sounds normal.  Neurological: She is alert and oriented to person, place, and time.  Skin: Skin is warm and dry.  Psychiatric: She has a normal mood and affect. Her behavior is normal.    ASSESSMENT/PLAN:  1. Dryness, eye Try restasis.  Go back to eye doctor  2. Arthralgia of right temporomandibular joint Discussed soft food and NSAID and warm compresses.  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 The TMJs are located near your ears and allow your jaw to open and close. These joints and the nearby muscles are involved in all movements of the jaw. People with TMJ syndrome have pain in the area of these joints and muscles. Chewing, biting,  or other movements of the jaw can be difficult or painful. TMJ syndrome can be caused by various things. In many cases, the condition is mild and goes away within a few weeks. For some people, the condition can become a long-term problem. What are the causes? Possible causes of TMJ syndrome include:  Grinding your teeth or clenching your jaw. Some people do this when they are under stress.  Arthritis.  Injury to the jaw.  Head or neck injury.  Teeth or dentures that are not aligned well. In some cases, the cause of TMJ syndrome may not be known. What are the signs or symptoms? The most common symptom is an aching pain on the side of the head in the area of the TMJ. Other symptoms may include:  Pain when moving your jaw, such as when chewing or biting.  Being unable to open your jaw all the way.  Making a clicking sound when you open your mouth.  Headache.  Earache.  Neck or shoulder pain. How is this diagnosed? Diagnosis can usually be made based on your symptoms, your medical history, and a physical exam. Your health care provider may check the range of motion of your jaw. Imaging tests, such as X-rays or an MRI, are sometimes done. You may need to see your dentist to determine if your teeth and jaw are lined up correctly. How is this treated? TMJ syndrome often goes away on its own. If treatment is needed, the options may include:  Eating soft foods and applying ice or heat.  Medicines to relieve pain or inflammation.  Medicines to relax the muscles.  A splint, bite plate, or mouthpiece to prevent teeth grinding or jaw clenching.  Relaxation techniques or counseling to help reduce stress.  Transcutaneous electrical nerve stimulation (TENS). This helps to relieve pain by applying an electrical current through the skin.  Acupuncture. This is sometimes helpful to relieve pain.  Jaw surgery. This is rarely needed. Follow these instructions at home:  Take medicines  only as directed by your health care provider.  Eat a soft diet if you are having trouble chewing.  Apply ice to the painful area.  Put ice in a plastic bag.  Place a towel between your skin and the bag.  Leave the ice on for 20 minutes, 2-3 times a day.  Apply a warm compress to the painful area as directed.  Massage your jaw area and perform any jaw stretching  exercises as recommended by your health care provider.  If you were given a mouthpiece or bite plate, wear it as directed.  Avoid foods that require a lot of chewing. Do not chew gum.  Keep all follow-up visits as directed by your health care provider. This is important. Contact a health care provider if:  You are having trouble eating.  You have new or worsening symptoms. Get help right away if:  Your jaw locks open or closed. This information is not intended to replace advice given to you by your health care provider. Make sure you discuss any questions you have with your health care provider. Document Released: 07/22/2001 Document Revised: 06/26/2016 Document Reviewed: 06/01/2014 Elsevier Interactive Patient Education  2017 [employer]    Raylene Everts, MD

## 2017-02-23 DIAGNOSIS — G8922 Chronic post-thoracotomy pain: Secondary | ICD-10-CM | POA: Diagnosis not present

## 2017-02-23 DIAGNOSIS — J449 Chronic obstructive pulmonary disease, unspecified: Secondary | ICD-10-CM | POA: Diagnosis not present

## 2017-02-23 DIAGNOSIS — B182 Chronic viral hepatitis C: Secondary | ICD-10-CM | POA: Diagnosis not present

## 2017-02-23 DIAGNOSIS — C3491 Malignant neoplasm of unspecified part of right bronchus or lung: Secondary | ICD-10-CM | POA: Diagnosis not present

## 2017-02-23 DIAGNOSIS — F411 Generalized anxiety disorder: Secondary | ICD-10-CM | POA: Diagnosis not present

## 2017-02-23 DIAGNOSIS — H9190 Unspecified hearing loss, unspecified ear: Secondary | ICD-10-CM | POA: Diagnosis not present

## 2017-02-23 DIAGNOSIS — E785 Hyperlipidemia, unspecified: Secondary | ICD-10-CM | POA: Diagnosis not present

## 2017-02-23 DIAGNOSIS — M1990 Unspecified osteoarthritis, unspecified site: Secondary | ICD-10-CM | POA: Diagnosis not present

## 2017-02-23 DIAGNOSIS — M62838 Other muscle spasm: Secondary | ICD-10-CM | POA: Diagnosis not present

## 2017-02-23 DIAGNOSIS — M792 Neuralgia and neuritis, unspecified: Secondary | ICD-10-CM | POA: Diagnosis not present

## 2017-02-23 DIAGNOSIS — G4733 Obstructive sleep apnea (adult) (pediatric): Secondary | ICD-10-CM | POA: Diagnosis not present

## 2017-02-23 DIAGNOSIS — M25571 Pain in right ankle and joints of right foot: Secondary | ICD-10-CM | POA: Diagnosis not present

## 2017-02-23 DIAGNOSIS — Z79899 Other long term (current) drug therapy: Secondary | ICD-10-CM | POA: Diagnosis not present

## 2017-02-25 ENCOUNTER — Other Ambulatory Visit (HOSPITAL_COMMUNITY)
Admission: RE | Admit: 2017-02-25 | Discharge: 2017-02-25 | Disposition: A | Discharge status: Home / Self Care | Payer: Medicare Other | Source: Ambulatory Visit | Attending: Internal Medicine | Admitting: Internal Medicine

## 2017-02-25 ENCOUNTER — Telehealth: Payer: Self-pay | Admitting: *Deleted

## 2017-02-25 ENCOUNTER — Ambulatory Visit (HOSPITAL_COMMUNITY)
Admission: RE | Admit: 2017-02-25 | Discharge: 2017-02-25 | Disposition: A | Discharge status: Home / Self Care | Payer: Medicare Other | Source: Ambulatory Visit | Attending: Internal Medicine | Admitting: Internal Medicine

## 2017-02-25 DIAGNOSIS — Z902 Acquired absence of lung [part of]: Secondary | ICD-10-CM | POA: Insufficient documentation

## 2017-02-25 DIAGNOSIS — R918 Other nonspecific abnormal finding of lung field: Secondary | ICD-10-CM | POA: Diagnosis not present

## 2017-02-25 DIAGNOSIS — C3491 Malignant neoplasm of unspecified part of right bronchus or lung: Secondary | ICD-10-CM

## 2017-02-25 DIAGNOSIS — I2510 Atherosclerotic heart disease of native coronary artery without angina pectoris: Secondary | ICD-10-CM | POA: Insufficient documentation

## 2017-02-25 DIAGNOSIS — Z0000 Encounter for general adult medical examination without abnormal findings: Secondary | ICD-10-CM | POA: Diagnosis present

## 2017-02-25 DIAGNOSIS — J439 Emphysema, unspecified: Secondary | ICD-10-CM | POA: Diagnosis not present

## 2017-02-25 DIAGNOSIS — Z08 Encounter for follow-up examination after completed treatment for malignant neoplasm: Secondary | ICD-10-CM | POA: Diagnosis not present

## 2017-02-25 DIAGNOSIS — I700 Atherosclerosis of aorta: Secondary | ICD-10-CM | POA: Diagnosis not present

## 2017-02-25 DIAGNOSIS — Z85118 Personal history of other malignant neoplasm of bronchus and lung: Secondary | ICD-10-CM | POA: Insufficient documentation

## 2017-02-25 LAB — CBC WITH DIFFERENTIAL/PLATELET
Basophils Absolute: 0 10*3/uL (ref 0.0–0.1)
Basophils Relative: 0 %
EOS ABS: 0.3 10*3/uL (ref 0.0–0.7)
Eosinophils Relative: 3 %
HCT: 42.6 % (ref 36.0–46.0)
HEMOGLOBIN: 14.2 g/dL (ref 12.0–15.0)
LYMPHS ABS: 2.3 10*3/uL (ref 0.7–4.0)
Lymphocytes Relative: 25 %
MCH: 30.7 pg (ref 26.0–34.0)
MCHC: 33.3 g/dL (ref 30.0–36.0)
MCV: 92.2 fL (ref 78.0–100.0)
MONOS PCT: 9 %
Monocytes Absolute: 0.8 10*3/uL (ref 0.1–1.0)
NEUTROS PCT: 63 %
Neutro Abs: 5.8 10*3/uL (ref 1.7–7.7)
Platelets: 230 10*3/uL (ref 150–400)
RBC: 4.62 MIL/uL (ref 3.87–5.11)
RDW: 13.8 % (ref 11.5–15.5)
WBC: 9.2 10*3/uL (ref 4.0–10.5)

## 2017-02-25 LAB — COMPREHENSIVE METABOLIC PANEL
ALBUMIN: 3.9 g/dL (ref 3.5–5.0)
ALT: 15 U/L (ref 14–54)
AST: 19 U/L (ref 15–41)
Alkaline Phosphatase: 60 U/L (ref 38–126)
Anion gap: 6 (ref 5–15)
BUN: 13 mg/dL (ref 6–20)
CHLORIDE: 102 mmol/L (ref 101–111)
CO2: 30 mmol/L (ref 22–32)
CREATININE: 0.68 mg/dL (ref 0.44–1.00)
Calcium: 9.1 mg/dL (ref 8.9–10.3)
GFR calc non Af Amer: >60 mL/min (ref >60)
GLUCOSE: 97 mg/dL (ref 65–99)
Potassium: 4.3 mmol/L (ref 3.5–5.1)
SODIUM: 138 mmol/L (ref 135–145)
Total Bilirubin: 0.4 mg/dL (ref 0.3–1.2)
Total Protein: 7.1 g/dL (ref 6.5–8.1)

## 2017-02-25 LAB — POCT I-STAT CREATININE: CREATININE: 0.7 mg/dL (ref 0.44–1.00)

## 2017-02-25 MED ORDER — IOPAMIDOL (ISOVUE-300) INJECTION 61%
75.0000 mL | Freq: Once | INTRAVENOUS | Status: AC | PRN
  Administered 2017-02-25: 75 mL via INTRAVENOUS

## 2017-02-25 NOTE — Telephone Encounter (Signed)
"  I had scan at [facility] today.  Now I am to have labs.  They received scan orders but did not receive lab orders and can't see them in the computer.  Can you send orders?"   This nurse can see active future orders for today.  Called [facility] spoke with Jeannene Patella with [facility].  Will fax copy of order requisition from open orders [facility].

## 2017-03-02 ENCOUNTER — Telehealth: Payer: Self-pay | Admitting: Internal Medicine

## 2017-03-02 DIAGNOSIS — I2510 Atherosclerotic heart disease of native coronary artery without angina pectoris: Secondary | ICD-10-CM | POA: Diagnosis not present

## 2017-03-02 DIAGNOSIS — I70219 Atherosclerosis of native arteries of extremities with intermittent claudication, unspecified extremity: Secondary | ICD-10-CM | POA: Diagnosis not present

## 2017-03-02 DIAGNOSIS — I6523 Occlusion and stenosis of bilateral carotid arteries: Secondary | ICD-10-CM | POA: Diagnosis not present

## 2017-03-02 DIAGNOSIS — I480 Paroxysmal atrial fibrillation: Secondary | ICD-10-CM | POA: Diagnosis not present

## 2017-03-02 DIAGNOSIS — I739 Peripheral vascular disease, unspecified: Secondary | ICD-10-CM | POA: Diagnosis not present

## 2017-03-02 DIAGNOSIS — E782 Mixed hyperlipidemia: Secondary | ICD-10-CM | POA: Diagnosis not present

## 2017-03-02 NOTE — Telephone Encounter (Signed)
MM PAL - moved 4/26 f/u to 5/1. Spoke with relative re change and new date/time.

## 2017-03-05 ENCOUNTER — Ambulatory Visit: Payer: Medicare Other | Admitting: Internal Medicine

## 2017-03-10 ENCOUNTER — Ambulatory Visit: Payer: Medicare Other | Admitting: Internal Medicine

## 2017-03-23 ENCOUNTER — Telehealth: Payer: Self-pay | Admitting: Family Medicine

## 2017-03-23 NOTE — Telephone Encounter (Signed)
Patient has appt with Dr. Moshe Cipro June 6th @ 9:20.  She wants to know if she will need blood work again. Fasting?   Please advise

## 2017-03-25 NOTE — Telephone Encounter (Signed)
Lab order mailed.

## 2017-03-29 ENCOUNTER — Other Ambulatory Visit: Payer: Self-pay | Admitting: Family Medicine

## 2017-04-01 ENCOUNTER — Ambulatory Visit (INDEPENDENT_AMBULATORY_CARE_PROVIDER_SITE_OTHER): Payer: Medicare Other

## 2017-04-01 VITALS — BP 122/70 | HR 57 | Ht 63.0 in | Wt 142.1 lb

## 2017-04-01 DIAGNOSIS — Z0000 Encounter for general adult medical examination without abnormal findings: Secondary | ICD-10-CM

## 2017-04-01 DIAGNOSIS — E559 Vitamin D deficiency, unspecified: Secondary | ICD-10-CM | POA: Diagnosis not present

## 2017-04-01 DIAGNOSIS — E782 Mixed hyperlipidemia: Secondary | ICD-10-CM | POA: Diagnosis not present

## 2017-04-01 DIAGNOSIS — I10 Essential (primary) hypertension: Secondary | ICD-10-CM | POA: Diagnosis not present

## 2017-04-01 NOTE — Patient Instructions (Addendum)
[name] , Thank you for taking time to come for your Medicare Wellness Visit. I appreciate your ongoing commitment to your health goals. Please review the following plan we discussed and let me know if I can assist you in the future.   Screening recommendations/referrals: Colonoscopy: Up to date, next due 01/2022 Mammogram: Up to date, next due 01/2018 Bone Density: Up to date Recommended yearly ophthalmology/optometry visit for glaucoma screening and checkup Recommended yearly dental visit for hygiene and checkup  Vaccinations: Influenza vaccine: Up to date, next due 06/2017 Pneumococcal vaccine: Up to date Tdap vaccine: Up to date, next due 02/2019 Shingles vaccine: Done    Advanced directives: Advance directive discussed with you today. I have provided a copy for you to complete at home and have notarized. Once this is complete please bring a copy in to our office so we can scan it into your chart.  Conditions/risks identified: Pre-obese, recommend starting a routine exercise program at least 3 days a week for 30-45 minutes at a time as tolerated.   Next appointment: Follow up with Dr. Moshe Cipro on 04/15/2017 at 9:20 am. Follow up in 1 year for your annual wellness visit.  Preventive Care 66 Years and Older, Female Preventive care refers to lifestyle choices and visits with your health care provider that can promote health and wellness. What does preventive care include?  A yearly physical exam. This is also called an annual well check.  Dental exams once or twice a year.  Routine eye exams. Ask your health care provider how often you should have your eyes checked.  Personal lifestyle choices, including:  Daily care of your teeth and gums.  Regular physical activity.  Eating a healthy diet.  Avoiding tobacco and drug use.  Limiting alcohol use.  Practicing safe sex.  Taking low-dose aspirin every day.  Taking vitamin and mineral supplements as recommended by your health  care provider. What happens during an annual well check? The services and screenings done by your health care provider during your annual well check will depend on your age, overall health, lifestyle risk factors, and family history of disease. Counseling  Your health care provider may ask you questions about your:  Alcohol use.  Tobacco use.  Drug use.  Emotional well-being.  Home and relationship well-being.  Sexual activity.  Eating habits.  History of falls.  Memory and ability to understand (cognition).  Work and work Statistician.  Reproductive health. Screening  You may have the following tests or measurements:  Height, weight, and BMI.  Blood pressure.  Lipid and cholesterol levels. These may be checked every 5 years, or more frequently if you are over 38 years old.  Skin check.  Lung cancer screening. You may have this screening every year starting at age 35 if you have a 30-pack-year history of smoking and currently smoke or have quit within the past 15 years.  Fecal occult blood test (FOBT) of the stool. You may have this test every year starting at age 11.  Flexible sigmoidoscopy or colonoscopy. You may have a sigmoidoscopy every 5 years or a colonoscopy every 10 years starting at age 19.  Hepatitis C blood test.  Hepatitis B blood test.  Sexually transmitted disease (STD) testing.  Diabetes screening. This is done by checking your blood sugar (glucose) after you have not eaten for a while (fasting). You may have this done every 1-3 years.  Bone density scan. This is done to screen for osteoporosis. You may have this done  starting at age 41.  Mammogram. This may be done every 1-2 years. Talk to your health care provider about how often you should have regular mammograms. Talk with your health care provider about your test results, treatment options, and if necessary, the need for more tests. Vaccines  Your health care provider may recommend certain  vaccines, such as:  Influenza vaccine. This is recommended every year.  Tetanus, diphtheria, and acellular pertussis (Tdap, Td) vaccine. You may need a Td booster every 10 years.  Zoster vaccine. You may need this after age 43.  Pneumococcal 13-valent conjugate (PCV13) vaccine. One dose is recommended after age 51.  Pneumococcal polysaccharide (PPSV23) vaccine. One dose is recommended after age 56. Talk to your health care provider about which screenings and vaccines you need and how often you need them. This information is not intended to replace advice given to you by your health care provider. Make sure you discuss any questions you have with your health care provider. Document Released: 11/23/2015 Document Revised: 07/16/2016 Document Reviewed: 08/28/2015 Elsevier Interactive Patient Education  2017 [place] Prevention in the Home Falls can cause injuries. They can happen to people of all ages. There are many things you can do to make your home safe and to help prevent falls. What can I do on the outside of my home?  Regularly fix the edges of walkways and driveways and fix any cracks.  Remove anything that might make you trip as you walk through a door, such as a raised step or threshold.  Trim any bushes or trees on the path to your home.  Use bright outdoor lighting.  Clear any walking paths of anything that might make someone trip, such as rocks or tools.  Regularly check to see if handrails are loose or broken. Make sure that both sides of any steps have handrails.  Any raised decks and porches should have guardrails on the edges.  Have any leaves, snow, or ice cleared regularly.  Use sand or salt on walking paths during winter.  Clean up any spills in your garage right away. This includes oil or grease spills. What can I do in the bathroom?  Use night lights.  Install grab bars by the toilet and in the tub and shower. Do not use towel bars as grab  bars.  Use non-skid mats or decals in the tub or shower.  If you need to sit down in the shower, use a plastic, non-slip stool.  Keep the floor dry. Clean up any water that spills on the floor as soon as it happens.  Remove soap buildup in the tub or shower regularly.  Attach bath mats securely with double-sided non-slip rug tape.  Do not have throw rugs and other things on the floor that can make you trip. What can I do in the bedroom?  Use night lights.  Make sure that you have a light by your bed that is easy to reach.  Do not use any sheets or blankets that are too big for your bed. They should not hang down onto the floor.  Have a firm chair that has side arms. You can use this for support while you get dressed.  Do not have throw rugs and other things on the floor that can make you trip. What can I do in the kitchen?  Clean up any spills right away.  Avoid walking on wet floors.  Keep items that you use a lot in easy-to-reach places.  If you need to reach something above you, use a strong step stool that has a grab bar.  Keep electrical cords out of the way.  Do not use floor polish or wax that makes floors slippery. If you must use wax, use non-skid floor wax.  Do not have throw rugs and other things on the floor that can make you trip. What can I do with my stairs?  Do not leave any items on the stairs.  Make sure that there are handrails on both sides of the stairs and use them. Fix handrails that are broken or loose. Make sure that handrails are as long as the stairways.  Check any carpeting to make sure that it is firmly attached to the stairs. Fix any carpet that is loose or worn.  Avoid having throw rugs at the top or bottom of the stairs. If you do have throw rugs, attach them to the floor with carpet tape.  Make sure that you have a light switch at the top of the stairs and the bottom of the stairs. If you do not have them, ask someone to add them for  you. What else can I do to help prevent falls?  Wear shoes that:  Do not have high heels.  Have rubber bottoms.  Are comfortable and fit you well.  Are closed at the toe. Do not wear sandals.  If you use a stepladder:  Make sure that it is fully opened. Do not climb a closed stepladder.  Make sure that both sides of the stepladder are locked into place.  Ask someone to hold it for you, if possible.  Clearly mark and make sure that you can see:  Any grab bars or handrails.  First and last steps.  Where the edge of each step is.  Use tools that help you move around (mobility aids) if they are needed. These include:  Canes.  Walkers.  Scooters.  Crutches.  Turn on the lights when you go into a dark area. Replace any light bulbs as soon as they burn out.  Set up your furniture so you have a clear path. Avoid moving your furniture around.  If any of your floors are uneven, fix them.  If there are any pets around you, be aware of where they are.  Review your medicines with your doctor. Some medicines can make you feel dizzy. This can increase your chance of falling. Ask your doctor what other things that you can do to help prevent falls. This information is not intended to replace advice given to you by your health care provider. Make sure you discuss any questions you have with your health care provider. Document Released: 08/23/2009 Document Revised: 04/03/2016 Document Reviewed: 12/01/2014 Elsevier Interactive Patient Education  2017 [employer].  Steps to Quit Smoking Smoking tobacco can be bad for your health. It can also affect almost every organ in your body. Smoking puts you and people around you at risk for many serious long-lasting (chronic) diseases. Quitting smoking is hard, but it is one of the best things that you can do for your health. It is never too late to quit. What are the benefits of quitting smoking? When you quit smoking, you lower your risk  for getting serious diseases and conditions. They can include:  Lung cancer or lung disease.  Heart disease.  Stroke.  Heart attack.  Not being able to have children (infertility).  Weak bones (osteoporosis) and broken bones (fractures). If you have coughing,  wheezing, and shortness of breath, those symptoms may get better when you quit. You may also get sick less often. If you are pregnant, quitting smoking can help to lower your chances of having a baby of low birth weight. What can I do to help me quit smoking? Talk with your doctor about what can help you quit smoking. Some things you can do (strategies) include:  Quitting smoking totally, instead of slowly cutting back how much you smoke over a period of time.  Going to in-person counseling. You are more likely to quit if you go to many counseling sessions.  Using resources and support systems, such as:  Online chats with a Social worker.  Phone quitlines.  Printed Furniture conservator/restorer.  Support groups or group counseling.  Text messaging programs.  Mobile phone apps or applications.  Taking medicines. Some of these medicines may have nicotine in them. If you are pregnant or breastfeeding, do not take any medicines to quit smoking unless your doctor says it is okay. Talk with your doctor about counseling or other things that can help you. Talk with your doctor about using more than one strategy at the same time, such as taking medicines while you are also going to in-person counseling. This can help make quitting easier. What things can I do to make it easier to quit? Quitting smoking might feel very hard at first, but there is a lot that you can do to make it easier. Take these steps:  Talk to your family and friends. Ask them to support and encourage you.  Call phone quitlines, reach out to support groups, or work with a Social worker.  Ask people who smoke to not smoke around you.  Avoid places that make you want  (trigger) to smoke, such as:  Bars.  Parties.  Smoke-break areas at work.  Spend time with people who do not smoke.  Lower the stress in your life. Stress can make you want to smoke. Try these things to help your stress:  Getting regular exercise.  Deep-breathing exercises.  Yoga.  Meditating.  Doing a body scan. To do this, close your eyes, focus on one area of your body at a time from head to toe, and notice which parts of your body are tense. Try to relax the muscles in those areas.  Download or buy apps on your mobile phone or tablet that can help you stick to your quit plan. There are many free apps, such as QuitGuide from the [employer] Office manager for Disease Control and Prevention). You can find more support from smokefree.gov and other websites. This information is not intended to replace advice given to you by your health care provider. Make sure you discuss any questions you have with your health care provider. Document Released: 08/23/2009 Document Revised: 06/24/2016 Document Reviewed: 03/13/2015 Elsevier Interactive Patient Education  2017 [employer].
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
## 2017-04-02 LAB — TSH: TSH: 2.4 mIU/L

## 2017-04-02 LAB — LIPID PANEL
CHOLESTEROL: 233 mg/dL — AB (ref <200)
HDL: 56 mg/dL (ref >50)
LDL Cholesterol: 152 mg/dL — ABNORMAL HIGH (ref <100)
TRIGLYCERIDES: 124 mg/dL (ref <150)
Total CHOL/HDL Ratio: 4.2 Ratio (ref <5.0)
VLDL: 25 mg/dL (ref <30)

## 2017-04-02 LAB — VITAMIN D 25 HYDROXY (VIT D DEFICIENCY, FRACTURES): Vit D, 25-Hydroxy: 29 ng/mL — ABNORMAL LOW (ref 30–100)

## 2017-04-02 LAB — BASIC METABOLIC PANEL
BUN: 17 mg/dL (ref 7–25)
CHLORIDE: 105 mmol/L (ref 98–110)
CO2: 23 mmol/L (ref 20–31)
CREATININE: 0.71 mg/dL (ref 0.50–0.99)
Calcium: 9.5 mg/dL (ref 8.6–10.4)
Glucose, Bld: 86 mg/dL (ref 65–99)
Potassium: 4.3 mmol/L (ref 3.5–5.3)
Sodium: 142 mmol/L (ref 135–146)

## 2017-04-09 ENCOUNTER — Telehealth: Payer: Self-pay | Admitting: Internal Medicine

## 2017-04-09 ENCOUNTER — Ambulatory Visit (HOSPITAL_BASED_OUTPATIENT_CLINIC_OR_DEPARTMENT_OTHER): Payer: Medicare Other | Admitting: Internal Medicine

## 2017-04-09 ENCOUNTER — Encounter: Payer: Self-pay | Admitting: Internal Medicine

## 2017-04-09 VITALS — BP 125/61 | HR 56 | Temp 97.9°F | Resp 18 | Ht 63.0 in | Wt 143.3 lb

## 2017-04-09 DIAGNOSIS — C3431 Malignant neoplasm of lower lobe, right bronchus or lung: Secondary | ICD-10-CM

## 2017-04-09 DIAGNOSIS — F17200 Nicotine dependence, unspecified, uncomplicated: Secondary | ICD-10-CM

## 2017-04-09 DIAGNOSIS — C3491 Malignant neoplasm of unspecified part of right bronchus or lung: Secondary | ICD-10-CM

## 2017-04-09 NOTE — Telephone Encounter (Signed)
Alerted desk nurse that patient will need a Rx for labs at [facility].  Per desk nurse, patient will receive call from [facility] regarding her lab and Ct appointments. Follow up with Dr Julien Nordmann was scheduled per patient date preference. Patient was given a copy of the AVS report and appointment schedule per 04/09/17 los.
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
## 2017-04-15 ENCOUNTER — Encounter: Payer: Self-pay | Admitting: Family Medicine

## 2017-04-15 ENCOUNTER — Ambulatory Visit (INDEPENDENT_AMBULATORY_CARE_PROVIDER_SITE_OTHER): Payer: Medicare Other | Admitting: Family Medicine

## 2017-04-15 VITALS — BP 120/64 | HR 62 | Resp 16 | Ht 63.0 in | Wt 143.0 lb

## 2017-04-15 DIAGNOSIS — D699 Hemorrhagic condition, unspecified: Secondary | ICD-10-CM

## 2017-04-15 DIAGNOSIS — M19079 Primary osteoarthritis, unspecified ankle and foot: Secondary | ICD-10-CM

## 2017-04-15 DIAGNOSIS — H04123 Dry eye syndrome of bilateral lacrimal glands: Secondary | ICD-10-CM | POA: Diagnosis not present

## 2017-04-15 DIAGNOSIS — L659 Nonscarring hair loss, unspecified: Secondary | ICD-10-CM

## 2017-04-15 DIAGNOSIS — F17210 Nicotine dependence, cigarettes, uncomplicated: Secondary | ICD-10-CM | POA: Diagnosis not present

## 2017-04-15 DIAGNOSIS — F17200 Nicotine dependence, unspecified, uncomplicated: Secondary | ICD-10-CM

## 2017-04-15 DIAGNOSIS — G894 Chronic pain syndrome: Secondary | ICD-10-CM

## 2017-04-15 DIAGNOSIS — I480 Paroxysmal atrial fibrillation: Secondary | ICD-10-CM | POA: Diagnosis not present

## 2017-04-15 DIAGNOSIS — J432 Centrilobular emphysema: Secondary | ICD-10-CM | POA: Diagnosis not present

## 2017-04-15 DIAGNOSIS — E782 Mixed hyperlipidemia: Secondary | ICD-10-CM

## 2017-04-15 DIAGNOSIS — I10 Essential (primary) hypertension: Secondary | ICD-10-CM | POA: Diagnosis not present

## 2017-04-15 NOTE — Patient Instructions (Addendum)
F/u in .5 months with rectal, call if you need me sooner  Please cut back by 1 cigarette per mmonth for next 5 months  Need to [place]!!  Fasting lipid, cmp  In 5 months  You are referred to ophthalmology in [place] pain maangement  Thank you  for choosing [facility]. We consider it a privelige to serve you.  Delivering excellent health care in a caring and  compassionate way is our goal.  Partnering with you,  so that together we can achieve this goal is our strategy.

## 2017-04-15 NOTE — Assessment & Plan Note (Signed)
Bilateral chronic dry eyes, h/o trauma to eyes, no response to all treatments she has tried in the past, requests eval at tertiary center, states vsion affected negatively , just had cataract surgery

## 2017-04-20 NOTE — Progress Notes (Signed)
[name]     MRN: [id number]      DOB: [birth year]   HPI [name] is here for follow up and re-evaluation of chronic medical conditions, medication management and review of any available recent lab and radiology data.  Preventive health is updated, specifically  Cancer screening and Immunization.   Questions or concerns regarding consultations or procedures which the PT has had in the interim are  Addressed.Has had f/u lung cancer and is to return in 1 year, unfortunately still smoking and unwilling to set a quit date The PT denies any adverse reactions to current medications since the last visit.  Requests referral to tertiary center re dry eye syndrome as recently discussed wih oncologist as she states despite all other treatments , this is not improving and she is actually losing her vision Concerned about opioid dependence and wants to consider THC substitute for pain , treated for pain by neurologist and will discuss with hiom  ROS Denies recent fever or chills. Denies sinus pressure, nasal congestion, ear pain or sore throat. Denies chest congestion, productive cough or wheezing. Denies chest pains, palpitations and leg swelling Denies abdominal pain, nausea, vomiting,diarrhea or constipation.   Denies dysuria, frequency, hesitancy or incontinence. C./o chronic ankle pain and decreased mobility Denies headaches, seizures, has chronic  numbness, and  tingling. Denies depression, anxiety or insomnia. Denies skin break down or rash.   PE  BP 120/64   Pulse 62   Resp 16   Ht 5\' 3"  (1.6 m)   Wt 143 lb (64.9 kg)   SpO2 96%   BMI 25.33 kg/m   Patient alert and oriented and in no cardiopulmonary distress.  HEENT: No facial asymmetry, EOMI,   oropharynx pink and moist.  Neck supple no JVD, no mass.  Chest: Clear to auscultation bilaterally.Decreased bilaterally  CVS: S1, S2 no murmurs, no S3.Regular rate.  ABD: Soft non tender.   Ext: No edema  MS: Adequate ROM  spine, shoulders, hips and knees.  Skin: Intact, no ulcerations or rash noted.  Psych: Good eye contact, normal affect. Memory intact not anxious or depressed appearing.  CNS: CN 2-12 intact, power,  normal throughout.no focal deficits noted.   Assessment & Plan  Chronically dry eyes, bilateral Bilateral chronic dry eyes, h/o trauma to eyes, no response to all treatments she has tried in the past, requests eval at tertiary center, states vsion affected negatively , just had cataract surgery  Essential hypertension Controlled, no change in medication DASH diet and commitment to daily physical activity for a minimum of 30 minutes discussed and encouraged, as a part of hypertension management. The importance of attaining a healthy weight is also discussed.  BP/Weight 04/15/2017 04/09/2017 04/01/2017 02/02/2017 01/07/2017 11/19/2016 23/53/6144  Systolic BP 315 400 867 619 509 326 712  Diastolic BP 64 61 70 74 64 78 69  Wt. (Lbs) 143 143.3 142.12 142 - 144 136.5  BMI 25.33 25.38 25.18 25.15 - 25.51 24.18       Paroxysmal a-fib ([facility]) Rate controlled on current medication  Arthropathy of ankle and foot Continues to experience pain, reduced mobility and poor function  Chronic pain syndrome Managed by neurology, states wants to change to [facility] drug from opioid and will discuss with Prescriber at next visit  Hair loss disorder Loss of hair , stress and anxiety the most likely trigger, no interest in derm eval at this time  NICOTINE ADDICTION Patient is asked and  confirms current  Nicotine use.  Five to seven minutes of time is spent in counseling the patient of the need to quit smoking  Advice to quit is delivered clearly specifically in reducing the risk of developing heart disease, having a stroke, or of developing all types of cancer, especially lung and oral cancer. Improvement in breathing and exercise tolerance and quality of life is also discussed, as is the economic  benefit.  Assessment of willingness to quit or to make an attempt to quit is made and documented  Assistance in quit attempt is made with several and varied options presented, based on patient's desire and need. These include  literature, local classes available, 1800 QUIT NOW number, OTC and prescription medication.  The GOAL to be NICOTINE FREE is re emphasized.she has already been diagnosed both with lung cancer and cAD and emphysema , she is bery aware of the many reasons that she does have to quit smoking  The patient has set a personal goal of either reduction or discontinuation and follow up is arranged between 6 an 16 weeks.

## 2017-04-21 ENCOUNTER — Encounter: Payer: Self-pay | Admitting: Family Medicine

## 2017-04-21 NOTE — Assessment & Plan Note (Signed)
Managed by neurology, states wants to change to [facility] drug from opioid and will discuss with Prescriber at next visit

## 2017-04-21 NOTE — Assessment & Plan Note (Signed)
Loss of hair , stress and anxiety the most likely trigger, no interest in derm eval at this time

## 2017-04-21 NOTE — Assessment & Plan Note (Addendum)
Patient is asked and  confirms current  Nicotine use.  Five to seven minutes of time is spent in counseling the patient of the need to quit smoking  Advice to quit is delivered clearly specifically in reducing the risk of developing heart disease, having a stroke, or of developing all types of cancer, especially lung and oral cancer. Improvement in breathing and exercise tolerance and quality of life is also discussed, as is the economic benefit.  Assessment of willingness to quit or to make an attempt to quit is made and documented  Assistance in quit attempt is made with several and varied options presented, based on patient's desire and need. These include  literature, local classes available, 1800 QUIT NOW number, OTC and prescription medication.  The GOAL to be NICOTINE FREE is re emphasized.she has already been diagnosed both with lung cancer and cAD and emphysema , she is bery aware of the many reasons that she does have to quit smoking  The patient has set a personal goal of either reduction or discontinuation and follow up is arranged between 6 an 16 weeks.

## 2017-04-21 NOTE — Assessment & Plan Note (Signed)
Continues to experience pain, reduced mobility and poor function

## 2017-04-21 NOTE — Assessment & Plan Note (Signed)
Rate controlled on current medication

## 2017-04-21 NOTE — Assessment & Plan Note (Signed)
Controlled, no change in medication DASH diet and commitment to daily physical activity for a minimum of 30 minutes discussed and encouraged, as a part of hypertension management. The importance of attaining a healthy weight is also discussed.  BP/Weight 04/15/2017 04/09/2017 04/01/2017 02/02/2017 01/07/2017 11/19/2016 94/58/5929  Systolic BP 244 628 638 177 116 579 038  Diastolic BP 64 61 70 74 64 78 69  Wt. (Lbs) 143 143.3 142.12 142 - 144 136.5  BMI 25.33 25.38 25.18 25.15 - 25.51 24.18

## 2017-05-19 ENCOUNTER — Ambulatory Visit: Payer: Medicare Other | Admitting: Podiatry

## 2017-05-22 DIAGNOSIS — I480 Paroxysmal atrial fibrillation: Secondary | ICD-10-CM | POA: Diagnosis not present

## 2017-05-25 DIAGNOSIS — I70219 Atherosclerosis of native arteries of extremities with intermittent claudication, unspecified extremity: Secondary | ICD-10-CM | POA: Diagnosis not present

## 2017-05-25 DIAGNOSIS — I480 Paroxysmal atrial fibrillation: Secondary | ICD-10-CM | POA: Diagnosis not present

## 2017-05-25 DIAGNOSIS — I2510 Atherosclerotic heart disease of native coronary artery without angina pectoris: Secondary | ICD-10-CM | POA: Diagnosis not present

## 2017-05-25 DIAGNOSIS — E782 Mixed hyperlipidemia: Secondary | ICD-10-CM | POA: Diagnosis not present

## 2017-05-25 DIAGNOSIS — I10 Essential (primary) hypertension: Secondary | ICD-10-CM | POA: Diagnosis not present

## 2017-05-29 DIAGNOSIS — H0012 Chalazion right lower eyelid: Secondary | ICD-10-CM | POA: Diagnosis not present

## 2017-05-29 DIAGNOSIS — H0289 Other specified disorders of eyelid: Secondary | ICD-10-CM | POA: Diagnosis not present

## 2017-05-29 DIAGNOSIS — Z961 Presence of intraocular lens: Secondary | ICD-10-CM | POA: Diagnosis not present

## 2017-05-29 DIAGNOSIS — H04123 Dry eye syndrome of bilateral lacrimal glands: Secondary | ICD-10-CM | POA: Diagnosis not present

## 2017-06-08 DIAGNOSIS — I480 Paroxysmal atrial fibrillation: Secondary | ICD-10-CM | POA: Diagnosis not present

## 2017-06-09 DIAGNOSIS — E782 Mixed hyperlipidemia: Secondary | ICD-10-CM | POA: Diagnosis not present

## 2017-06-09 DIAGNOSIS — I10 Essential (primary) hypertension: Secondary | ICD-10-CM | POA: Diagnosis not present

## 2017-06-09 DIAGNOSIS — I2510 Atherosclerotic heart disease of native coronary artery without angina pectoris: Secondary | ICD-10-CM | POA: Diagnosis not present

## 2017-06-09 DIAGNOSIS — I480 Paroxysmal atrial fibrillation: Secondary | ICD-10-CM | POA: Diagnosis not present

## 2017-06-22 DIAGNOSIS — Z79899 Other long term (current) drug therapy: Secondary | ICD-10-CM | POA: Diagnosis not present

## 2017-06-22 DIAGNOSIS — F411 Generalized anxiety disorder: Secondary | ICD-10-CM | POA: Diagnosis not present

## 2017-06-22 DIAGNOSIS — G8922 Chronic post-thoracotomy pain: Secondary | ICD-10-CM | POA: Diagnosis not present

## 2017-06-22 DIAGNOSIS — J449 Chronic obstructive pulmonary disease, unspecified: Secondary | ICD-10-CM | POA: Diagnosis not present

## 2017-06-22 DIAGNOSIS — M79673 Pain in unspecified foot: Secondary | ICD-10-CM | POA: Diagnosis not present

## 2017-06-22 DIAGNOSIS — J9611 Chronic respiratory failure with hypoxia: Secondary | ICD-10-CM | POA: Diagnosis not present

## 2017-06-22 DIAGNOSIS — E785 Hyperlipidemia, unspecified: Secondary | ICD-10-CM | POA: Diagnosis not present

## 2017-06-22 DIAGNOSIS — G4733 Obstructive sleep apnea (adult) (pediatric): Secondary | ICD-10-CM | POA: Diagnosis not present

## 2017-06-22 DIAGNOSIS — M25571 Pain in right ankle and joints of right foot: Secondary | ICD-10-CM | POA: Diagnosis not present

## 2017-06-22 DIAGNOSIS — G894 Chronic pain syndrome: Secondary | ICD-10-CM | POA: Diagnosis not present

## 2017-06-22 DIAGNOSIS — C3491 Malignant neoplasm of unspecified part of right bronchus or lung: Secondary | ICD-10-CM | POA: Diagnosis not present

## 2017-06-22 DIAGNOSIS — M1990 Unspecified osteoarthritis, unspecified site: Secondary | ICD-10-CM | POA: Diagnosis not present

## 2017-06-22 DIAGNOSIS — M79609 Pain in unspecified limb: Secondary | ICD-10-CM | POA: Diagnosis not present

## 2017-07-03 DIAGNOSIS — H04123 Dry eye syndrome of bilateral lacrimal glands: Secondary | ICD-10-CM | POA: Diagnosis not present

## 2017-07-07 ENCOUNTER — Telehealth: Payer: Self-pay | Admitting: *Deleted

## 2017-07-07 NOTE — Telephone Encounter (Signed)
I spoke directly to the patient and advised her to go directly to urgent care or emergency room she has multiple listed allergies I have nothing to suggest over the telephone
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
## 2017-07-08 NOTE — Telephone Encounter (Signed)
Noted  

## 2017-07-09 DIAGNOSIS — Z888 Allergy status to other drugs, medicaments and biological substances status: Secondary | ICD-10-CM | POA: Diagnosis not present

## 2017-07-09 DIAGNOSIS — Z885 Allergy status to narcotic agent status: Secondary | ICD-10-CM | POA: Diagnosis not present

## 2017-07-09 DIAGNOSIS — Z886 Allergy status to analgesic agent status: Secondary | ICD-10-CM | POA: Diagnosis not present

## 2017-07-09 DIAGNOSIS — F17210 Nicotine dependence, cigarettes, uncomplicated: Secondary | ICD-10-CM | POA: Diagnosis not present

## 2017-07-09 DIAGNOSIS — Z91048 Other nonmedicinal substance allergy status: Secondary | ICD-10-CM | POA: Diagnosis not present

## 2017-07-09 DIAGNOSIS — L249 Irritant contact dermatitis, unspecified cause: Secondary | ICD-10-CM | POA: Diagnosis not present

## 2017-07-09 DIAGNOSIS — Z79899 Other long term (current) drug therapy: Secondary | ICD-10-CM | POA: Diagnosis not present

## 2017-07-14 ENCOUNTER — Telehealth: Payer: Self-pay | Admitting: Family Medicine

## 2017-07-14 ENCOUNTER — Other Ambulatory Visit: Payer: Self-pay | Admitting: Family Medicine

## 2017-07-14 DIAGNOSIS — Z889 Allergy status to unspecified drugs, medicaments and biological substances status: Secondary | ICD-10-CM

## 2017-07-14 DIAGNOSIS — L237 Allergic contact dermatitis due to plants, except food: Secondary | ICD-10-CM

## 2017-07-14 NOTE — Telephone Encounter (Signed)
I spoke with the ptient , I have referred her to an allergist, I advised her that for a limited time only for excess itching , up to 3 days , she may take zyrtec twice daily  Pls see referral for allergist

## 2017-07-14 NOTE — Progress Notes (Unsigned)
Medrol dose pack

## 2017-07-14 NOTE — Telephone Encounter (Signed)
Patient left message on nurse line. She states she spoke to Dr. Moshe Cipro the other day about poison ivy- she was told to go to ED, and she did. They told if she did not know thy names of the medication to treat it, they didn't either. They gave her IV Medrol 125mg  and it helped for 12 hours and she is back to where she started.  Callback# [phone]

## 2017-07-15 ENCOUNTER — Telehealth: Payer: Self-pay | Admitting: Family Medicine

## 2017-07-15 NOTE — Telephone Encounter (Signed)
I spoke to the patient yesterday and I advised her to use zyrtec

## 2017-07-15 NOTE — Telephone Encounter (Signed)
Patient calling to see if you can call in something (was given Medrol in the hospital-by IV ) for the poison ivy.  Please call in @ [facility]

## 2017-08-11 ENCOUNTER — Ambulatory Visit (INDEPENDENT_AMBULATORY_CARE_PROVIDER_SITE_OTHER): Payer: Medicare Other | Admitting: Podiatry

## 2017-08-11 ENCOUNTER — Ambulatory Visit: Payer: Medicare Other

## 2017-08-11 DIAGNOSIS — L600 Ingrowing nail: Secondary | ICD-10-CM

## 2017-08-11 DIAGNOSIS — M79673 Pain in unspecified foot: Secondary | ICD-10-CM

## 2017-08-12 ENCOUNTER — Other Ambulatory Visit: Payer: Self-pay | Admitting: Medical Oncology

## 2017-08-12 DIAGNOSIS — C3491 Malignant neoplasm of unspecified part of right bronchus or lung: Secondary | ICD-10-CM

## 2017-08-12 NOTE — Progress Notes (Signed)
Subjective: Patient presents today for evaluation of pain in toe(s). Patient is concerned for possible ingrown nail. Patient states that the pain has been present for a few weeks now. Patient presents today for further treatment and evaluation.   Past Medical History:  Diagnosis Date  . Acute GI bleeding 01/28/2012  . Anemia due to blood loss, acute 01/28/2012  . 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 Arthritis    "qwhere; hands, feet, overall stiffness" (10/20/2013)  . Cancer ([place])    lung cancer  . Chronic bronchitis ([place])   . Chronic lower back pain   . Complication of anesthesia    "lungs quit working during [facility] in [place]" (10/20/2013); pt. states that she can't breathe after surgery when laying on back  . COPD (chronic obstructive pulmonary disease) ([place])   . Daily headache    Patient stated they are felt in back of the head, not throbing. But always in same spot. MRI's done, no reason why they occur. (10/20/2013)  . Depression   . Diastolic dysfunction 3/71/6967   Grade 1  . Diverticulitis    pt reports 8 times. Dr. Geroge Baseman colectomy in 2009  . Diverticulosis 2008   diagnosed; pt. states now cured 07/19/15  . Dysrhythmia    pt. states not since ablation  . Fibromyalgia   . Fracture 2006   left foot & ankle , immobilized for healing   . Gout    Recently diagnosed.  [facility] HEARING LOSS    since age 33  . Hepatitis C 1993    Needs Hepatic panel every 6   months, treated for 1 year   . Hiatal hernia    "repaired"   . History of blood transfusion    "probably when I was young, when I was 15" (10/20/2013)  . History of pneumonia   . Hyperlipidemia 2001  . Hypertension 2001  . Menopause    per medical history form  . Night sweats    Per medical history form dated 05/02/11.  . On home oxygen therapy    "2L only at night" (10/20/2013); pt. currently not wearing O2 at night (07/19/15)  . Panic disorder    was followed by mental health  . Sleep  apnea 2001   non compliant wit the use of the machine  . Sleep apnea    wear oxygen at bedtime.   . SOB (shortness of breath)    "after lying in bed, go to the bathroom; heart races & I'm SOB" (10/20/2013)  . SVT (supraventricular tachycardia) ([place])    s/p ablation 10-20-2013 by Dr Lovena Le  . Tinnitus 2006   disabling  . Wears dentures    Per medical history form dated 05/02/11.  . Wears glasses     Objective:  General: Well developed, nourished, in no acute distress, alert and oriented x3   Dermatology: Skin is warm, dry and supple bilateral. Lateral border of the right great toe appears to be erythematous with evidence of an ingrowing nail. Pain on palpation noted to the border of the nail fold. The remaining nails appear unremarkable at this time. There are no open sores, lesions.  Vascular: Dorsalis Pedis artery and Posterior Tibial artery pedal pulses palpable. No lower extremity edema noted.   Neruologic: Grossly intact via light touch bilateral.  Musculoskeletal: Muscular strength within normal limits in all groups bilateral. Normal range of motion noted to all pedal and ankle joints.   Assesement: #1 Paronychia  with ingrowing nail lateral border of the right great toe #2 Pain in toe #3 Incurvated nail  Plan of Care:  1. Patient evaluated.  2. Discussed treatment alternatives and plan of care. Explained nail avulsion procedure and post procedure course to patient. 3. Patient opted for conservative treatment at this time..  4. Mechanical debridement of lateral border of the right great toe performed using a nail nipper. Filed with dremel without incident.  5. Return to clinic in 4 weeks. If not better, partial permanent nail avulsion will be done at the surgery center.   Edrick Kins, DPM [facility]  Dr. Edrick Kins, [address]                                        [place], [place] [address]                Office [phone]  Fax  [phone]

## 2017-08-12 NOTE — Addendum Note (Signed)
Addended by: Ardeen Garland on: 08/12/2017 12:41 PM   Modules accepted: Orders

## 2017-08-19 DIAGNOSIS — G8922 Chronic post-thoracotomy pain: Secondary | ICD-10-CM | POA: Diagnosis not present

## 2017-08-19 DIAGNOSIS — Z79899 Other long term (current) drug therapy: Secondary | ICD-10-CM | POA: Diagnosis not present

## 2017-08-19 DIAGNOSIS — R260 Ataxic gait: Secondary | ICD-10-CM | POA: Diagnosis not present

## 2017-08-19 DIAGNOSIS — M25571 Pain in right ankle and joints of right foot: Secondary | ICD-10-CM | POA: Diagnosis not present

## 2017-08-19 DIAGNOSIS — Z23 Encounter for immunization: Secondary | ICD-10-CM | POA: Diagnosis not present

## 2017-09-02 DIAGNOSIS — H02886 Meibomian gland dysfunction of left eye, unspecified eyelid: Secondary | ICD-10-CM | POA: Diagnosis not present

## 2017-09-02 DIAGNOSIS — H04123 Dry eye syndrome of bilateral lacrimal glands: Secondary | ICD-10-CM | POA: Diagnosis not present

## 2017-09-02 DIAGNOSIS — Z961 Presence of intraocular lens: Secondary | ICD-10-CM | POA: Diagnosis not present

## 2017-09-02 DIAGNOSIS — H02883 Meibomian gland dysfunction of right eye, unspecified eyelid: Secondary | ICD-10-CM | POA: Diagnosis not present

## 2017-09-08 ENCOUNTER — Encounter: Payer: Self-pay | Admitting: Allergy & Immunology

## 2017-09-08 ENCOUNTER — Ambulatory Visit (INDEPENDENT_AMBULATORY_CARE_PROVIDER_SITE_OTHER): Payer: Medicare Other | Admitting: Allergy & Immunology

## 2017-09-08 ENCOUNTER — Ambulatory Visit: Payer: Medicare Other | Admitting: Podiatry

## 2017-09-08 VITALS — BP 110/60 | HR 62 | Temp 98.0°F | Resp 16 | Ht 62.99 in | Wt 140.2 lb

## 2017-09-08 DIAGNOSIS — J302 Other seasonal allergic rhinitis: Secondary | ICD-10-CM | POA: Insufficient documentation

## 2017-09-08 DIAGNOSIS — J42 Unspecified chronic bronchitis: Secondary | ICD-10-CM | POA: Diagnosis not present

## 2017-09-08 DIAGNOSIS — J449 Chronic obstructive pulmonary disease, unspecified: Secondary | ICD-10-CM | POA: Diagnosis not present

## 2017-09-08 DIAGNOSIS — T63481D Toxic effect of venom of other arthropod, accidental (unintentional), subsequent encounter: Secondary | ICD-10-CM

## 2017-09-08 DIAGNOSIS — J3089 Other allergic rhinitis: Secondary | ICD-10-CM | POA: Diagnosis not present

## 2017-09-08 DIAGNOSIS — T7840XD Allergy, unspecified, subsequent encounter: Secondary | ICD-10-CM

## 2017-09-08 MED ORDER — EPINEPHRINE 0.3 MG/0.3ML IJ SOAJ: 0.3000 mg | Freq: Once | INTRAMUSCULAR | 2 refills | Status: AC
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
## 2017-09-08 NOTE — Patient Instructions (Addendum)
1. Seasonal and perennial allergic rhinitis - Testing today showed: trees, weeds, indoor molds, dust mites and dog - Avoidance measures provided. - Start taking: Zyrtec (cetirizine) 10mg  tablet once daily as needed. - You can use nasal saline in the winter when your nose is dryer.  - You can use an extra dose of the antihistamine, if needed, for breakthrough symptoms.  - Consider nasal saline rinses 1-2 times daily to remove allergens from the nasal cavities as well as help with mucous clearance (this is especially helpful to do before the nasal sprays are given) - Consider allergy shots as a means of long-term control. - Allergy shots "re-train" and "reset" the immune system to ignore environmental allergens and decrease the resulting immune response to those allergens (sneezing, itchy watery eyes, runny nose, nasal congestion, etc).    - Allergy shots improve symptoms in 75-85% of patients.  - We can discuss more at the next appointment if the medications are not working for you.  2. Allergic reaction - multiple triggers - For future reactions, I would recommend taking Zyrtec one tablet 2-3 times daily as needed since you have tolerated this in the past. - You also tolerated Solu-Medrol without a problem, so you can take that again. - Also, you can receive epinephrine (EpiPen).  - I would highly recommend that you get an EpiPen.   3. Allergic reaction to insect sting - We will get some blood work to look for allergies to fire ants and stinging insects. - We will call you in 1-2 weeks with the results.   4. Return in about 3 months (around 12/09/2017).  Please inform us of any Emergency Department visits, hospitalizations, or changes in symptoms. Call us before going to the ED for breathing or allergy symptoms since we might be able to fit you in for a sick visit. Feel free to contact us anytime with any questions, problems, or concerns.  It was a pleasure to meet you today! Enjoy the fall  season!  Websites that have reliable patient information: 1. American Academy of Asthma, Allergy, and Immunology: www.aaaai.org 2. Food Allergy Research and Education (FARE): foodallergy.org 3. Mothers of Asthmatics: http://www.asthmacommunitynetwork.org 4. American College of Allergy, Asthma, and Immunology: www.acaai.org   Election Day is coming up on Tuesday, November 6th! Although it is too late to register to vote by mail, you can still register up to November 5th at any of the early voting locations. Try to early vote in case there are problems with your registration!   If you are turned away at the polls, you have the right to request a provisional ballot, which is required by law!      [place]- [place] (open 8am - 5pm) First Floor [place], [place] (open [place]) [place], [place] (open 7am - 7pm)  [place], [employer] (open 7am - 7pm) [address], [employer] (open 7am - 7pm) [place], [employer] (open 7am - 7pm) [place], [place] (open [place]) [place], [place] (open 7am - 7pm) [place], [employer] (open 7am - 7pm) [address], [place]  Reducing Pollen Exposure  The [employer] of Allergy, Asthma and Immunology suggests the  following steps to reduce your exposure to pollen during allergy seasons.    1. Do not hang sheets or clothing out to dry; pollen may collect on these items. 2. Do not mow lawns or spend time around freshly cut grass; mowing stirs up pollen. 3. Keep windows closed at night.  Keep car windows closed while driving. 4. Minimize morning activities outdoors, a time when pollen counts are usually at their highest. 5. Stay indoors as much as  possible when pollen counts or humidity is high and on windy days when pollen tends to remain in the air longer. 6. Use air conditioning when possible.  Many air conditioners have filters that trap the pollen spores. 7. Use a HEPA room air filter to remove pollen form the indoor air you breathe.  Control of Mold Allergen   Mold and fungi can grow on a variety of surfaces provided certain temperature and moisture conditions exist.  Outdoor molds grow on plants, decaying vegetation and soil.  The major outdoor mold, Alternaria and Cladosporium, are found in very high numbers during hot and dry conditions.  Generally, a late Summer - Fall peak is seen for common outdoor fungal spores.  Rain will temporarily lower outdoor mold spore count, but counts rise rapidly when the rainy period ends.  The most important indoor molds are Aspergillus and Penicillium.  Dark, humid and poorly ventilated basements are ideal sites for mold growth.  The next most common sites of mold growth are the bathroom and the kitchen. ++++                            + Indoor (Perennial) Mold Control   Positive indoor molds via skin testing: Aspergillus  1. Maintain humidity below 50%. 2. Clean washable surfaces with 5% bleach solution. 3. Remove sources e.g. contaminated carpets.     Control of House Dust Mite Allergen    House dust mites play a major role in allergic asthma and rhinitis.  They occur in environments with high humidity wherever human skin, the food for dust mites is found. High levels have been detected in dust obtained from mattresses, pillows, carpets, upholstered furniture, bed covers, clothes and soft toys.  The principal allergen of the house dust mite is found in its feces.  A gram of dust may contain 1,000 mites and 250,000 fecal particles.  Mite antigen is easily measured in the air during house cleaning activities.    1. Encase mattresses, including the box spring, and  pillow, in an air tight cover.  Seal the zipper end of the encased mattresses with wide adhesive tape. 2. Wash the bedding in water of 130 degrees Farenheit weekly.  Avoid cotton comforters/quilts and flannel bedding: the most ideal bed covering is the dacron comforter. 3. Remove all upholstered furniture from the bedroom. 4. Remove carpets, carpet padding, rugs, and non-washable window drapes from the bedroom.  Wash drapes weekly or use plastic window coverings. 5. Remove all non-washable stuffed toys from the bedroom.  Wash stuffed toys weekly. 6. Have the room cleaned frequently with a vacuum cleaner and a damp dust-mop.  The patient should not be in a room which is being cleaned and should wait 1 hour after cleaning before going into the room. 7. Close and seal all heating outlets in the bedroom.  Otherwise, the room will become filled with dust-laden air.  An electric heater can be used to heat the room. 8. Reduce indoor humidity  to less than 50%.  Do not use a humidifier.  Control of Dog or Cat Allergen  Avoidance is the best way to manage a dog or cat allergy. If you have a dog or cat and are allergic to dog or cats, consider removing the dog or cat from the home. If you have a dog or cat but don't want to find it a new home, or if your family wants a pet even though someone in the household is allergic, here are some strategies that may help keep symptoms at bay:  1. Keep the pet out of your bedroom and restrict it to only a few rooms. Be advised that keeping the dog or cat in only one room will not limit the allergens to that room. 2. Don't pet, hug or kiss the dog or cat; if you do, wash your hands with soap and water. 3. High-efficiency particulate air (HEPA) cleaners run continuously in a bedroom or living room can reduce allergen levels over time. 4. Regular use of a high-efficiency vacuum cleaner or a central vacuum can reduce allergen levels. 5. Giving your dog or cat a bath at least  once a week can reduce airborne allergen.

## 2017-09-08 NOTE — Addendum Note (Signed)
Addended by: Valentina Shaggy on: 09/08/2017 01:57 PM   Modules accepted: Level of Service

## 2017-09-11 ENCOUNTER — Encounter (HOSPITAL_COMMUNITY): Payer: Self-pay

## 2017-09-11 ENCOUNTER — Other Ambulatory Visit (HOSPITAL_COMMUNITY)
Admission: RE | Admit: 2017-09-11 | Discharge: 2017-09-11 | Disposition: A | Discharge status: Home / Self Care | Payer: Medicare Other | Source: Ambulatory Visit | Attending: Internal Medicine | Admitting: Internal Medicine

## 2017-09-11 ENCOUNTER — Ambulatory Visit (HOSPITAL_COMMUNITY)
Admission: RE | Admit: 2017-09-11 | Discharge: 2017-09-11 | Disposition: A | Discharge status: Home / Self Care | Payer: Medicare Other | Source: Ambulatory Visit | Attending: Internal Medicine | Admitting: Internal Medicine

## 2017-09-11 ENCOUNTER — Other Ambulatory Visit (HOSPITAL_COMMUNITY)
Admission: RE | Admit: 2017-09-11 | Discharge: 2017-09-11 | Disposition: A | Discharge status: Home / Self Care | Payer: Medicare Other | Attending: *Deleted | Admitting: *Deleted

## 2017-09-11 ENCOUNTER — Other Ambulatory Visit (HOSPITAL_COMMUNITY): Payer: Self-pay

## 2017-09-11 DIAGNOSIS — Z85118 Personal history of other malignant neoplasm of bronchus and lung: Secondary | ICD-10-CM | POA: Insufficient documentation

## 2017-09-11 DIAGNOSIS — M47814 Spondylosis without myelopathy or radiculopathy, thoracic region: Secondary | ICD-10-CM | POA: Diagnosis not present

## 2017-09-11 DIAGNOSIS — I2699 Other pulmonary embolism without acute cor pulmonale: Secondary | ICD-10-CM | POA: Diagnosis not present

## 2017-09-11 DIAGNOSIS — C3491 Malignant neoplasm of unspecified part of right bronchus or lung: Secondary | ICD-10-CM

## 2017-09-11 DIAGNOSIS — Z902 Acquired absence of lung [part of]: Secondary | ICD-10-CM | POA: Diagnosis not present

## 2017-09-11 DIAGNOSIS — I700 Atherosclerosis of aorta: Secondary | ICD-10-CM | POA: Diagnosis not present

## 2017-09-11 DIAGNOSIS — I2510 Atherosclerotic heart disease of native coronary artery without angina pectoris: Secondary | ICD-10-CM | POA: Insufficient documentation

## 2017-09-11 DIAGNOSIS — I517 Cardiomegaly: Secondary | ICD-10-CM | POA: Insufficient documentation

## 2017-09-11 DIAGNOSIS — J432 Centrilobular emphysema: Secondary | ICD-10-CM | POA: Insufficient documentation

## 2017-09-11 DIAGNOSIS — C3490 Malignant neoplasm of unspecified part of unspecified bronchus or lung: Secondary | ICD-10-CM | POA: Diagnosis not present

## 2017-09-11 LAB — COMPREHENSIVE METABOLIC PANEL
ALT: 16 U/L (ref 14–54)
ANION GAP: 8 (ref 5–15)
AST: 22 U/L (ref 15–41)
Albumin: 3.8 g/dL (ref 3.5–5.0)
Alkaline Phosphatase: 57 U/L (ref 38–126)
BILIRUBIN TOTAL: 0.4 mg/dL (ref 0.3–1.2)
BUN: 15 mg/dL (ref 6–20)
CHLORIDE: 104 mmol/L (ref 101–111)
CO2: 27 mmol/L (ref 22–32)
Calcium: 9 mg/dL (ref 8.9–10.3)
Creatinine, Ser: 0.82 mg/dL (ref 0.44–1.00)
GFR calc Af Amer: >60 mL/min (ref >60)
Glucose, Bld: 98 mg/dL (ref 65–99)
POTASSIUM: 4.2 mmol/L (ref 3.5–5.1)
Sodium: 139 mmol/L (ref 135–145)
TOTAL PROTEIN: 6.6 g/dL (ref 6.5–8.1)

## 2017-09-11 LAB — CBC WITH DIFFERENTIAL/PLATELET
BASOS ABS: 0 10*3/uL (ref 0.0–0.1)
Basophils Relative: 0 %
Eosinophils Absolute: 0.2 10*3/uL (ref 0.0–0.7)
Eosinophils Relative: 2 %
HEMATOCRIT: 41.5 % (ref 36.0–46.0)
HEMOGLOBIN: 13.7 g/dL (ref 12.0–15.0)
LYMPHS ABS: 2.4 10*3/uL (ref 0.7–4.0)
LYMPHS PCT: 24 %
MCH: 30.5 pg (ref 26.0–34.0)
MCHC: 33 g/dL (ref 30.0–36.0)
MCV: 92.4 fL (ref 78.0–100.0)
Monocytes Absolute: 0.8 10*3/uL (ref 0.1–1.0)
Monocytes Relative: 9 %
NEUTROS ABS: 6.3 10*3/uL (ref 1.7–7.7)
NEUTROS PCT: 65 %
PLATELETS: 188 10*3/uL (ref 150–400)
RBC: 4.49 MIL/uL (ref 3.87–5.11)
RDW: 13.6 % (ref 11.5–15.5)
WBC: 9.8 10*3/uL (ref 4.0–10.5)

## 2017-09-11 LAB — LIPID PANEL
CHOL/HDL RATIO: 5.7 ratio
Cholesterol: 268 mg/dL — ABNORMAL HIGH (ref 0–200)
HDL: 47 mg/dL (ref >40)
LDL CALC: 207 mg/dL — AB (ref 0–99)
TRIGLYCERIDES: 71 mg/dL (ref <150)
VLDL: 14 mg/dL (ref 0–40)

## 2017-09-11 LAB — POCT I-STAT CREATININE: Creatinine, Ser: 0.8 mg/dL (ref 0.44–1.00)

## 2017-09-11 MED ORDER — IOPAMIDOL (ISOVUE-300) INJECTION 61%
75.0000 mL | Freq: Once | INTRAVENOUS | Status: AC | PRN
  Administered 2017-09-11: 75 mL via INTRAVENOUS

## 2017-09-13 LAB — MISC LABCORP TEST (SEND OUT): Labcorp test code: 671871

## 2017-09-14 LAB — TRYPTASE: TRYPTASE: 4.2 ug/L (ref 2.2–13.2)

## 2017-09-15 ENCOUNTER — Ambulatory Visit (INDEPENDENT_AMBULATORY_CARE_PROVIDER_SITE_OTHER): Payer: Medicare Other | Admitting: Podiatry

## 2017-09-15 ENCOUNTER — Ambulatory Visit: Payer: Medicare Other | Admitting: Internal Medicine

## 2017-09-15 ENCOUNTER — Encounter: Payer: Self-pay | Admitting: Podiatry

## 2017-09-15 DIAGNOSIS — M659 Synovitis and tenosynovitis, unspecified: Secondary | ICD-10-CM

## 2017-09-15 DIAGNOSIS — M19071 Primary osteoarthritis, right ankle and foot: Secondary | ICD-10-CM

## 2017-09-16 ENCOUNTER — Other Ambulatory Visit: Payer: Self-pay | Admitting: Family Medicine

## 2017-09-16 ENCOUNTER — Encounter: Payer: Self-pay | Admitting: Family Medicine

## 2017-09-16 ENCOUNTER — Ambulatory Visit (INDEPENDENT_AMBULATORY_CARE_PROVIDER_SITE_OTHER): Payer: Medicare Other | Admitting: Family Medicine

## 2017-09-16 VITALS — BP 112/64 | HR 88 | Resp 16 | Ht 63.0 in | Wt 140.8 lb

## 2017-09-16 DIAGNOSIS — Z1211 Encounter for screening for malignant neoplasm of colon: Secondary | ICD-10-CM

## 2017-09-16 DIAGNOSIS — F17200 Nicotine dependence, unspecified, uncomplicated: Secondary | ICD-10-CM | POA: Diagnosis not present

## 2017-09-16 DIAGNOSIS — J449 Chronic obstructive pulmonary disease, unspecified: Secondary | ICD-10-CM | POA: Diagnosis not present

## 2017-09-16 DIAGNOSIS — I10 Essential (primary) hypertension: Secondary | ICD-10-CM

## 2017-09-16 DIAGNOSIS — F418 Other specified anxiety disorders: Secondary | ICD-10-CM | POA: Diagnosis not present

## 2017-09-16 DIAGNOSIS — E785 Hyperlipidemia, unspecified: Secondary | ICD-10-CM

## 2017-09-16 DIAGNOSIS — H04123 Dry eye syndrome of bilateral lacrimal glands: Secondary | ICD-10-CM

## 2017-09-16 LAB — MISC LABCORP TEST (SEND OUT): Labcorp test code: 6022893

## 2017-09-16 LAB — POC HEMOCCULT BLD/STL (OFFICE/1-CARD/DIAGNOSTIC): Fecal Occult Blood, POC: NEGATIVE

## 2017-09-16 MED ORDER — CETIRIZINE HCL 10 MG PO TABS: 10.0000 mg | ORAL_TABLET | Freq: Every day | ORAL | 3 refills | Status: DC

## 2017-09-16 NOTE — Patient Instructions (Addendum)
F/u with MD in 6 months, call if you need me sooner please.  Fasting lipid and cmp 1 week before next visit  Labs are excellent and exam is good except for cholesterol being even higher than in the past, please work on changing your diet to reduce  your risk of worsening heart disease and stroke  Please work on cutting back cigaette smokng with a plan to quitting, now you smoke 1 PPD  You need to quit  Thankful you are getting improvement in your health  Rectal exam today shows no hidden blood in stool which is  Good  Thank you  for choosing [facility]. We consider it a privelige to serve you.  Delivering excellent health care in a caring and  compassionate way is our goal.  Partnering with you,  so that together we can achieve this goal is our strategy.

## 2017-09-16 NOTE — Progress Notes (Signed)
[name]     MRN: [id number]      DOB: 1951-01-18   HPI Ms. Pain is here for follow up and re-evaluation of chronic medical conditions, medication management and review of any available recent lab and radiology data.  Preventive health is updated, specifically  Cancer screening and Immunization.   Seeing a Podiatrist  Getting special inserts made to help with inserts , now c/o back and hip pain Seeing an ophthalmologist at [facility] and is beeing treated Has seen an allergist has documented dog allergy and she will not stop adopting them among other allergies. The PT denies any adverse reactions to current medications since the last visit.  There are no new concerns.  There are no specific complaints   ROS Denies recent fever or chills. Denies sinus pressure, nasal congestion, ear pain or sore throat. Denies chest congestion, productive cough or wheezing. Denies chest pains, palpitations and leg swelling Denies abdominal pain, nausea, vomiting,diarrhea or constipation.  No change in bowel movements. Denies dysuria, frequency, hesitancy or incontinence. Denies joint pain, swelling and limitation in mobility. Denies headaches, seizures, numbness, or tingling. Denies depression, anxiety or insomnia. Denies skin break down or rash.   PE  BP 112/64   Pulse 88   Resp 16   Ht 5\' 3"  (1.6 m)   Wt 140 lb 12.8 oz (63.9 kg)   SpO2 93%   BMI 24.94 kg/m   Patient alert and oriented and in no cardiopulmonary distress.  HEENT: No facial asymmetry, EOMI,   oropharynx pink and moist.  Neck supple no JVD, no mass.  Chest: Clear to auscultation bilaterally.Decreased air entry  CVS: S1, S2 no murmurs, no S3.Regular rate.  ABD: Soft non tender.  Rectal: no mass, heme negative stool  Ext: No edema  MS: Adequate ROM spine, shoulders, hips and knees.  Skin: Intact, no ulcerations or rash noted.  Psych: Good eye contact, normal affect. Memory intact not anxious or depressed  appearing.  CNS: CN 2-12 intact, power,  normal throughout.no focal deficits noted.   Assessment & Plan  Essential hypertension Controlled, no change in medication DASH diet and commitment to daily physical activity for a minimum of 30 minutes discussed and encouraged, as a part of hypertension management. The importance of attaining a healthy weight is also discussed.  BP/Weight 09/17/2017 09/16/2017 09/08/2017 04/15/2017 04/09/2017 04/01/2017 2/83/1517  Systolic BP 616 073 710 626 948 546 270  Diastolic BP 76 64 60 64 61 70 74  Wt. (Lbs) 139.2 140.8 140.2 143 143.3 142.12 142  BMI 24.66 24.94 24.84 25.33 25.38 25.18 25.15       Hyperlipemia Deteriorated Hyperlipidemia:Low fat diet discussed and encouraged.   Lipid Panel  Lab Results  Component Value Date   CHOL 268 (H) 09/11/2017   HDL 47 09/11/2017   LDLCALC 207 (H) 09/11/2017   TRIG 71 09/11/2017   CHOLHDL 5.7 09/11/2017   Reports hair loss with statin, refuses medication, low fat diet reviewed in detail again, pt eats a lot of eggs, scrambled and fried   Chronically dry eyes, bilateral Improving under current ophthalmology care  Depression with anxiety Stable and controlled, no change in management  NICOTINE ADDICTION Patient is asked and  confirms current  Nicotine use.  Five to seven minutes of time is spent in counseling the patient of the need to quit smoking  Advice to quit is delivered clearly specifically in reducing the risk of developing heart disease, having a stroke, or of developing all types  of cancer, especially lung and oral cancer. Improvement in breathing and exercise tolerance and quality of life is also discussed, as is the economic benefit.  Assessment of willingness to quit or to make an attempt to quit is made and documented  Assistance in quit attempt is made with several and varied options presented, based on patient's desire and need. These include  literature, local classes available,  1800 QUIT NOW number, OTC and prescription medication.  The GOAL to be NICOTINE FREE is re emphasized.  The patient has set a personal goal of either reduction or discontinuation and follow up is arranged between 6 an 16 weeks.    Chronic obstructive pulmonary disease (HCC) Controlled, no change in medication

## 2017-09-17 ENCOUNTER — Telehealth: Payer: Self-pay

## 2017-09-17 ENCOUNTER — Encounter: Payer: Self-pay | Admitting: Internal Medicine

## 2017-09-17 ENCOUNTER — Telehealth: Payer: Self-pay | Admitting: Allergy & Immunology

## 2017-09-17 ENCOUNTER — Ambulatory Visit (HOSPITAL_BASED_OUTPATIENT_CLINIC_OR_DEPARTMENT_OTHER): Payer: Medicare Other | Admitting: Internal Medicine

## 2017-09-17 VITALS — BP 114/76 | HR 65 | Temp 98.6°F | Resp 18 | Ht 63.0 in | Wt 139.2 lb

## 2017-09-17 DIAGNOSIS — C3431 Malignant neoplasm of lower lobe, right bronchus or lung: Secondary | ICD-10-CM

## 2017-09-17 DIAGNOSIS — C3491 Malignant neoplasm of unspecified part of right bronchus or lung: Secondary | ICD-10-CM

## 2017-09-17 DIAGNOSIS — C3490 Malignant neoplasm of unspecified part of unspecified bronchus or lung: Secondary | ICD-10-CM

## 2017-09-17 DIAGNOSIS — Z720 Tobacco use: Secondary | ICD-10-CM
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
## 2017-09-17 NOTE — Telephone Encounter (Signed)
Printed avs and calender for upcoming appointment. Per 11/8 los

## 2017-09-17 NOTE — Telephone Encounter (Signed)
Pt was returning millie call.
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
## 2017-09-20 ENCOUNTER — Encounter: Payer: Self-pay | Admitting: Family Medicine

## 2017-09-20 NOTE — Assessment & Plan Note (Signed)

## 2017-09-20 NOTE — Assessment & Plan Note (Signed)
Controlled, no change in medication  

## 2017-09-20 NOTE — Assessment & Plan Note (Signed)
Deteriorated Hyperlipidemia:Low fat diet discussed and encouraged.   Lipid Panel  Lab Results  Component Value Date   CHOL 268 (H) 09/11/2017   HDL 47 09/11/2017   LDLCALC 207 (H) 09/11/2017   TRIG 71 09/11/2017   CHOLHDL 5.7 09/11/2017   Reports hair loss with statin, refuses medication, low fat diet reviewed in detail again, pt eats a lot of eggs, scrambled and fried

## 2017-09-20 NOTE — Assessment & Plan Note (Signed)
Controlled, no change in medication DASH diet and commitment to daily physical activity for a minimum of 30 minutes discussed and encouraged, as a part of hypertension management. The importance of attaining a healthy weight is also discussed.  BP/Weight 09/17/2017 09/16/2017 09/08/2017 04/15/2017 04/09/2017 04/01/2017 2/86/3817  Systolic BP 711 657 903 [phone]  Diastolic BP 76 64 60 64 61 70 74  Wt. (Lbs) 139.2 140.8 140.2 143 143.3 142.12 142  BMI 24.66 24.94 24.84 25.33 25.38 25.18 25.15

## 2017-09-20 NOTE — Assessment & Plan Note (Signed)
Improving under current ophthalmology care

## 2017-09-20 NOTE — Assessment & Plan Note (Signed)
Stable and controlled, no change in management

## 2017-09-21 ENCOUNTER — Encounter: Payer: Self-pay | Admitting: *Deleted

## 2017-09-21 ENCOUNTER — Telehealth: Payer: Self-pay | Admitting: *Deleted

## 2017-09-21 NOTE — Telephone Encounter (Signed)
[facility] called patient on 09/18/17

## 2017-09-21 NOTE — Telephone Encounter (Signed)
Patient called back in regards to lab results. Reviewed with patient also will put her the [facility] list. When this becomes available will contact patient to schedule.

## 2017-09-29 ENCOUNTER — Telehealth: Payer: Self-pay | Admitting: *Deleted

## 2017-09-29 NOTE — Telephone Encounter (Signed)
Schedule for 10:40 tomorrow. ONLY slot available

## 2017-09-29 NOTE — Telephone Encounter (Signed)
Patient is coming in December 12th per her request.

## 2017-09-29 NOTE — Telephone Encounter (Signed)
Patient called stating she needs to see Dr Moshe Cipro for a list of issues, patient states when she went to the cancer dr in [place] and something showed in her CT scan and she had a high hernia but she had surgery on it a long time ago. Patient states she is now having trouble swallowing and food getting stuck on the way down.   Patient states she also has issues with her sinuses, bladder, and a bad issue with her hip that she believes is coming from her foot.   Please advise [phone]

## 2017-10-06 DIAGNOSIS — I6523 Occlusion and stenosis of bilateral carotid arteries: Secondary | ICD-10-CM | POA: Insufficient documentation

## 2017-10-06 DIAGNOSIS — I10 Essential (primary) hypertension: Secondary | ICD-10-CM | POA: Diagnosis not present

## 2017-10-06 DIAGNOSIS — E782 Mixed hyperlipidemia: Secondary | ICD-10-CM | POA: Diagnosis not present

## 2017-10-06 DIAGNOSIS — I4891 Unspecified atrial fibrillation: Secondary | ICD-10-CM | POA: Diagnosis not present

## 2017-10-06 DIAGNOSIS — K449 Diaphragmatic hernia without obstruction or gangrene: Secondary | ICD-10-CM | POA: Diagnosis not present

## 2017-10-06 DIAGNOSIS — I700 Atherosclerosis of aorta: Secondary | ICD-10-CM | POA: Insufficient documentation

## 2017-10-06 DIAGNOSIS — I2510 Atherosclerotic heart disease of native coronary artery without angina pectoris: Secondary | ICD-10-CM | POA: Diagnosis not present

## 2017-10-07 DIAGNOSIS — H04123 Dry eye syndrome of bilateral lacrimal glands: Secondary | ICD-10-CM | POA: Diagnosis not present

## 2017-10-14 ENCOUNTER — Other Ambulatory Visit: Payer: Medicare Other | Admitting: Orthotics

## 2017-10-16 DIAGNOSIS — M25551 Pain in right hip: Secondary | ICD-10-CM | POA: Diagnosis not present

## 2017-10-16 DIAGNOSIS — M25571 Pain in right ankle and joints of right foot: Secondary | ICD-10-CM | POA: Diagnosis not present

## 2017-10-16 DIAGNOSIS — R252 Cramp and spasm: Secondary | ICD-10-CM | POA: Diagnosis not present

## 2017-10-16 DIAGNOSIS — Z79891 Long term (current) use of opiate analgesic: Secondary | ICD-10-CM | POA: Diagnosis not present

## 2017-10-21 ENCOUNTER — Ambulatory Visit: Payer: Medicare Other | Admitting: Family Medicine

## 2017-10-30 DIAGNOSIS — I2510 Atherosclerotic heart disease of native coronary artery without angina pectoris: Secondary | ICD-10-CM | POA: Diagnosis not present

## 2017-11-05 DIAGNOSIS — I480 Paroxysmal atrial fibrillation: Secondary | ICD-10-CM | POA: Diagnosis not present

## 2017-11-05 DIAGNOSIS — I10 Essential (primary) hypertension: Secondary | ICD-10-CM | POA: Diagnosis not present

## 2017-11-05 DIAGNOSIS — I2510 Atherosclerotic heart disease of native coronary artery without angina pectoris: Secondary | ICD-10-CM | POA: Diagnosis not present

## 2017-11-05 DIAGNOSIS — E782 Mixed hyperlipidemia: Secondary | ICD-10-CM | POA: Diagnosis not present

## 2017-11-09 ENCOUNTER — Ambulatory Visit (INDEPENDENT_AMBULATORY_CARE_PROVIDER_SITE_OTHER): Payer: Medicare Other | Admitting: Family Medicine

## 2017-11-09 ENCOUNTER — Other Ambulatory Visit (HOSPITAL_COMMUNITY): Payer: Self-pay | Admitting: Neurology

## 2017-11-09 ENCOUNTER — Ambulatory Visit (HOSPITAL_COMMUNITY)
Admission: RE | Admit: 2017-11-09 | Discharge: 2017-11-09 | Disposition: A | Discharge status: Home / Self Care | Payer: Medicare Other | Source: Ambulatory Visit | Attending: Neurology | Admitting: Neurology

## 2017-11-09 ENCOUNTER — Encounter: Payer: Self-pay | Admitting: Family Medicine

## 2017-11-09 ENCOUNTER — Ambulatory Visit (HOSPITAL_COMMUNITY)
Admission: RE | Admit: 2017-11-09 | Discharge: 2017-11-09 | Disposition: A | Discharge status: Home / Self Care | Payer: Medicare Other | Source: Ambulatory Visit | Attending: Family Medicine | Admitting: Family Medicine

## 2017-11-09 VITALS — BP 110/62 | HR 60 | Resp 16 | Ht 63.0 in | Wt 142.0 lb

## 2017-11-09 DIAGNOSIS — I10 Essential (primary) hypertension: Secondary | ICD-10-CM

## 2017-11-09 DIAGNOSIS — M25562 Pain in left knee: Secondary | ICD-10-CM | POA: Diagnosis not present

## 2017-11-09 DIAGNOSIS — R52 Pain, unspecified: Secondary | ICD-10-CM

## 2017-11-09 DIAGNOSIS — E785 Hyperlipidemia, unspecified: Secondary | ICD-10-CM

## 2017-11-09 DIAGNOSIS — M25551 Pain in right hip: Secondary | ICD-10-CM | POA: Diagnosis not present

## 2017-11-09 DIAGNOSIS — I480 Paroxysmal atrial fibrillation: Secondary | ICD-10-CM

## 2017-11-09 DIAGNOSIS — M19079 Primary osteoarthritis, unspecified ankle and foot: Secondary | ICD-10-CM

## 2017-11-09 DIAGNOSIS — F17210 Nicotine dependence, cigarettes, uncomplicated: Secondary | ICD-10-CM | POA: Diagnosis not present

## 2017-11-09 DIAGNOSIS — G8929 Other chronic pain: Secondary | ICD-10-CM | POA: Diagnosis not present

## 2017-11-09 DIAGNOSIS — F418 Other specified anxiety disorders: Secondary | ICD-10-CM | POA: Diagnosis not present

## 2017-11-09 DIAGNOSIS — M25561 Pain in right knee: Secondary | ICD-10-CM

## 2017-11-09 DIAGNOSIS — F17200 Nicotine dependence, unspecified, uncomplicated: Secondary | ICD-10-CM

## 2017-11-09 NOTE — Patient Instructions (Signed)
F/u in May as before, call if you need me before  X rays of both knees are ordered today  Congrats  On working on quitting smoking, you have a BRILLIANT plan, goal of quitting , current I s 10 to [place] will work with swallowing and gERD

## 2017-11-09 NOTE — Progress Notes (Signed)
[name]     MRN: [id number]      DOB: Sep 30, 1951   HPI [name] is here for follow up and re-evaluation of chronic medical conditions, medication management and review of any available recent lab and radiology data.  Preventive health is updated, specifically  Cancer screening and Immunization.   Questions or concerns regarding consultations or procedures which the PT has had in the interim are  addressed. The PT denies any adverse reactions to current medications since the last visit.  Right hip and kne pain rated at 8 for past 3 months, affecting back , and has been ordered xrays of hip today  Of rihght hip by pain Doc, wants knees X rayed also Right knee  Constantly aches  At 6, and left knee felt a pop 5 days ago , and since then she n she has  Has ortho appt on Jan 3 at [facility], and one on the 9th with foot doc for rigth foot, and 11the for eye Doc , and to GI, has difficulty  Swallowing solids and liquids  For the past 1 year Smoking less in past 4 weeks now tha she has a new rescue dog, still unable  to set a quit date but wants to quit  ROS: Denies recent fever or chills. Denies sinus pressure, nasal congestion, ear pain or sore throat. Denies chest congestion, productive cough or wheezing. Denies chest pains, palpitations and leg swelling  Denies dysuria, frequency, hesitancy or incontinence.  Denies headaches, seizures, numbness, or tingling. Denies  Uncontrolled depression, anxiety or insomnia. Denies skin break down or rash.   PE  BP 110/62   Pulse 60   Resp 16   Ht 5\' 3"  (1.6 m)   Wt 142 lb (64.4 kg)   SpO2 95%   BMI 25.15 kg/m   Patient alert and oriented and in no cardiopulmonary distress.  HEENT: No facial asymmetry, EOMI,   oropharynx pink and moist.  Neck supple no JVD, no mass.  Chest: Clear to auscultation bilaterally.Decrased air entry  CVS: S1, S2 no murmurs, no S3.Regular rate.  ABD: Soft non tender.   Ext: No edema  MS: Decreased  ROM spine, , adequate in hips and reduced in  knees.Decreased in right ankle  Skin: Intact, no ulcerations or rash noted.  Psych: Good eye contact, normal affect. Memory intact not anxious or depressed appearing.  CNS: CN 2-12 intact, power,  normal throughout.no focal deficits noted.   Assessment & Plan  Chronic pain of right knee Chronic right knee pain likely referred from spine, worse in past 5 days following left knee injury, x ray ordered and has ortho eval upcoming  Ankle arthritis Chronic right ankle pain and stiffness, unchanged  Depression with anxiety Controlled, no change in medication   Essential hypertension Controlled, no change in medication DASH diet and commitment to daily physical activity for a minimum of 30 minutes discussed and encouraged, as a part of hypertension management. The importance of attaining a healthy weight is also discussed.  BP/Weight 11/09/2017 09/17/2017 09/16/2017 09/08/2017 04/15/2017 04/09/2017 05/22/4579  Systolic BP 998 [phone]  Diastolic BP 62 76 64 60 64 61 70  Wt. (Lbs) 142 139.2 140.8 140.2 143 143.3 142.12  BMI 25.15 24.66 24.94 24.84 25.33 25.38 25.18       Paroxysmal a-fib ([facility]) Maintained on plavix  NICOTINE ADDICTION Patient is asked and  confirms current  Nicotine use.  Five to seven minutes of time is  spent in counseling the patient of the need to quit smoking  Advice to quit is delivered clearly specifically in reducing the risk of developing heart disease, having a stroke, or of developing all types of cancer, especially lung and oral cancer. Improvement in breathing and exercise tolerance and quality of life is also discussed, as is the economic benefit.  Assessment of willingness to quit or to make an attempt to quit is made and documented  Assistance in quit attempt is made with several and varied options presented, based on patient's desire and need. These include  literature, local classes  available, 1800 QUIT NOW number, OTC and prescription medication.  The GOAL to be NICOTINE FREE is re emphasized.  The patient has set a personal goal of either reduction or discontinuation and follow up is arranged between 6 an 16 weeks.    Hyperlipemia Hyperlipidemia:Low fat diet discussed and encouraged.   Lipid Panel  Lab Results  Component Value Date   CHOL 268 (H) 09/11/2017   HDL 47 09/11/2017   LDLCALC 207 (H) 09/11/2017   TRIG 71 09/11/2017   CHOLHDL 5.7 09/11/2017   Uncontrolled Updated lab needed at/ before next visit.

## 2017-11-11 ENCOUNTER — Encounter: Payer: Self-pay | Admitting: Family Medicine

## 2017-11-11 DIAGNOSIS — Z79891 Long term (current) use of opiate analgesic: Secondary | ICD-10-CM | POA: Diagnosis not present

## 2017-11-11 DIAGNOSIS — M25551 Pain in right hip: Secondary | ICD-10-CM | POA: Diagnosis not present

## 2017-11-11 DIAGNOSIS — M25571 Pain in right ankle and joints of right foot: Secondary | ICD-10-CM | POA: Diagnosis not present

## 2017-11-11 DIAGNOSIS — R252 Cramp and spasm: Secondary | ICD-10-CM | POA: Diagnosis not present

## 2017-11-11 NOTE — Assessment & Plan Note (Addendum)
Chronic right knee pain likely referred from spine, worse in past 5 days following left knee injury, x ray ordered and has ortho eval upcoming

## 2017-11-12 DIAGNOSIS — M7071 Other bursitis of hip, right hip: Secondary | ICD-10-CM | POA: Diagnosis not present

## 2017-11-12 NOTE — Assessment & Plan Note (Signed)

## 2017-11-12 NOTE — Assessment & Plan Note (Signed)
Chronic right ankle pain and stiffness, unchanged

## 2017-11-12 NOTE — Assessment & Plan Note (Signed)
Hyperlipidemia:Low fat diet discussed and encouraged.   Lipid Panel  Lab Results  Component Value Date   CHOL 268 (H) 09/11/2017   HDL 47 09/11/2017   LDLCALC 207 (H) 09/11/2017   TRIG 71 09/11/2017   CHOLHDL 5.7 09/11/2017   Uncontrolled Updated lab needed at/ before next visit.

## 2017-11-12 NOTE — Assessment & Plan Note (Signed)
Controlled, no change in medication  

## 2017-11-12 NOTE — Assessment & Plan Note (Signed)
Controlled, no change in medication DASH diet and commitment to daily physical activity for a minimum of 30 minutes discussed and encouraged, as a part of hypertension management. The importance of attaining a healthy weight is also discussed.  BP/Weight 11/09/2017 09/17/2017 09/16/2017 09/08/2017 04/15/2017 04/09/2017 2/48/1859  Systolic BP 093 112 162 446 950 722 575  Diastolic BP 62 76 64 60 64 61 70  Wt. (Lbs) 142 139.2 140.8 140.2 143 143.3 142.12  BMI 25.15 24.66 24.94 24.84 25.33 25.38 25.18

## 2017-11-12 NOTE — Assessment & Plan Note (Signed)
Maintained on plavix

## 2017-11-18 ENCOUNTER — Ambulatory Visit: Payer: Medicare Other | Admitting: Orthotics

## 2017-11-18 DIAGNOSIS — M659 Synovitis and tenosynovitis, unspecified: Secondary | ICD-10-CM

## 2017-11-18 NOTE — Progress Notes (Signed)
Patient decided NOT to pursue F/O due to financial considerations.

## 2017-11-20 DIAGNOSIS — H539 Unspecified visual disturbance: Secondary | ICD-10-CM | POA: Diagnosis not present

## 2017-11-20 DIAGNOSIS — H02883 Meibomian gland dysfunction of right eye, unspecified eyelid: Secondary | ICD-10-CM | POA: Diagnosis not present

## 2017-11-20 DIAGNOSIS — H02886 Meibomian gland dysfunction of left eye, unspecified eyelid: Secondary | ICD-10-CM | POA: Diagnosis not present

## 2017-11-20 DIAGNOSIS — Z961 Presence of intraocular lens: Secondary | ICD-10-CM | POA: Diagnosis not present

## 2017-11-20 DIAGNOSIS — H04123 Dry eye syndrome of bilateral lacrimal glands: Secondary | ICD-10-CM | POA: Diagnosis not present

## 2017-11-30 ENCOUNTER — Other Ambulatory Visit: Payer: Self-pay | Admitting: Gastroenterology

## 2017-11-30 DIAGNOSIS — R1310 Dysphagia, unspecified: Secondary | ICD-10-CM

## 2017-11-30 DIAGNOSIS — K219 Gastro-esophageal reflux disease without esophagitis: Secondary | ICD-10-CM

## 2017-12-07 DIAGNOSIS — R252 Cramp and spasm: Secondary | ICD-10-CM | POA: Diagnosis not present

## 2017-12-07 DIAGNOSIS — G4700 Insomnia, unspecified: Secondary | ICD-10-CM | POA: Diagnosis not present

## 2017-12-07 DIAGNOSIS — M25571 Pain in right ankle and joints of right foot: Secondary | ICD-10-CM | POA: Diagnosis not present

## 2017-12-07 DIAGNOSIS — M25551 Pain in right hip: Secondary | ICD-10-CM | POA: Diagnosis not present

## 2017-12-08 ENCOUNTER — Ambulatory Visit: Payer: Medicare Other | Admitting: Podiatry

## 2017-12-14 ENCOUNTER — Ambulatory Visit: Payer: Medicare Other

## 2017-12-15 ENCOUNTER — Ambulatory Visit: Payer: Medicare Other | Admitting: Allergy & Immunology

## 2017-12-28 ENCOUNTER — Ambulatory Visit
Admission: RE | Admit: 2017-12-28 | Discharge: 2017-12-28 | Disposition: A | Discharge status: Home / Self Care | Payer: Medicare Other | Source: Ambulatory Visit | Attending: Gastroenterology | Admitting: Gastroenterology

## 2017-12-28 DIAGNOSIS — Z9889 Other specified postprocedural states: Secondary | ICD-10-CM | POA: Insufficient documentation

## 2017-12-28 DIAGNOSIS — K219 Gastro-esophageal reflux disease without esophagitis: Secondary | ICD-10-CM | POA: Diagnosis not present

## 2017-12-28 DIAGNOSIS — R1310 Dysphagia, unspecified: Secondary | ICD-10-CM | POA: Diagnosis present

## 2018-01-04 DIAGNOSIS — I700 Atherosclerosis of aorta: Secondary | ICD-10-CM | POA: Diagnosis not present

## 2018-01-04 DIAGNOSIS — E782 Mixed hyperlipidemia: Secondary | ICD-10-CM | POA: Diagnosis not present

## 2018-01-04 DIAGNOSIS — I480 Paroxysmal atrial fibrillation: Secondary | ICD-10-CM | POA: Diagnosis not present

## 2018-01-04 DIAGNOSIS — R002 Palpitations: Secondary | ICD-10-CM | POA: Diagnosis not present

## 2018-01-06 DIAGNOSIS — H04123 Dry eye syndrome of bilateral lacrimal glands: Secondary | ICD-10-CM | POA: Diagnosis not present

## 2018-01-19 ENCOUNTER — Encounter
Admission: RE | Disposition: A | Discharge status: Home / Self Care | Payer: Self-pay | Source: Ambulatory Visit | Attending: Gastroenterology

## 2018-01-19 ENCOUNTER — Encounter: Payer: Self-pay | Admitting: *Deleted

## 2018-01-19 ENCOUNTER — Ambulatory Visit
Admission: RE | Admit: 2018-01-19 | Discharge: 2018-01-19 | Disposition: A | Discharge status: Home / Self Care | Payer: Medicare Other | Source: Ambulatory Visit | Attending: Gastroenterology | Admitting: Gastroenterology

## 2018-01-19 ENCOUNTER — Ambulatory Visit: Payer: Medicare Other | Admitting: Anesthesiology

## 2018-01-19 DIAGNOSIS — Z9889 Other specified postprocedural states: Secondary | ICD-10-CM | POA: Insufficient documentation

## 2018-01-19 DIAGNOSIS — I2510 Atherosclerotic heart disease of native coronary artery without angina pectoris: Secondary | ICD-10-CM | POA: Insufficient documentation

## 2018-01-19 DIAGNOSIS — R1310 Dysphagia, unspecified: Secondary | ICD-10-CM | POA: Diagnosis not present

## 2018-01-19 DIAGNOSIS — G4730 Sleep apnea, unspecified: Secondary | ICD-10-CM | POA: Diagnosis not present

## 2018-01-19 DIAGNOSIS — E785 Hyperlipidemia, unspecified: Secondary | ICD-10-CM | POA: Diagnosis not present

## 2018-01-19 DIAGNOSIS — J449 Chronic obstructive pulmonary disease, unspecified: Secondary | ICD-10-CM | POA: Insufficient documentation

## 2018-01-19 DIAGNOSIS — K210 Gastro-esophageal reflux disease with esophagitis: Secondary | ICD-10-CM | POA: Insufficient documentation

## 2018-01-19 DIAGNOSIS — K319 Disease of stomach and duodenum, unspecified: Secondary | ICD-10-CM | POA: Diagnosis not present

## 2018-01-19 DIAGNOSIS — K2960 Other gastritis without bleeding: Secondary | ICD-10-CM | POA: Insufficient documentation

## 2018-01-19 DIAGNOSIS — K219 Gastro-esophageal reflux disease without esophagitis: Secondary | ICD-10-CM | POA: Diagnosis not present

## 2018-01-19 DIAGNOSIS — Z79899 Other long term (current) drug therapy: Secondary | ICD-10-CM | POA: Diagnosis not present

## 2018-01-19 DIAGNOSIS — G4733 Obstructive sleep apnea (adult) (pediatric): Secondary | ICD-10-CM | POA: Diagnosis not present

## 2018-01-19 DIAGNOSIS — Z7902 Long term (current) use of antithrombotics/antiplatelets: Secondary | ICD-10-CM | POA: Diagnosis not present

## 2018-01-19 DIAGNOSIS — K449 Diaphragmatic hernia without obstruction or gangrene: Secondary | ICD-10-CM | POA: Insufficient documentation

## 2018-01-19 DIAGNOSIS — Z791 Long term (current) use of non-steroidal anti-inflammatories (NSAID): Secondary | ICD-10-CM | POA: Diagnosis not present

## 2018-01-19 DIAGNOSIS — F17200 Nicotine dependence, unspecified, uncomplicated: Secondary | ICD-10-CM | POA: Insufficient documentation

## 2018-01-19 DIAGNOSIS — K2950 Unspecified chronic gastritis without bleeding: Secondary | ICD-10-CM | POA: Diagnosis not present

## 2018-01-19 DIAGNOSIS — K3189 Other diseases of stomach and duodenum: Secondary | ICD-10-CM | POA: Diagnosis not present

## 2018-01-19 DIAGNOSIS — I10 Essential (primary) hypertension: Secondary | ICD-10-CM | POA: Diagnosis not present

## 2018-01-19 DIAGNOSIS — K209 Esophagitis, unspecified: Secondary | ICD-10-CM | POA: Diagnosis not present

## 2018-01-19 DIAGNOSIS — Z9049 Acquired absence of other specified parts of digestive tract: Secondary | ICD-10-CM | POA: Diagnosis not present

## 2018-01-19 DIAGNOSIS — K259 Gastric ulcer, unspecified as acute or chronic, without hemorrhage or perforation: Secondary | ICD-10-CM | POA: Diagnosis not present

## 2018-01-19 HISTORY — DX: Atherosclerotic heart disease of native coronary artery without angina pectoris: I25.10

## 2018-01-19 HISTORY — PX: ESOPHAGOGASTRODUODENOSCOPY (EGD) WITH PROPOFOL: SHX5813

## 2018-01-19 SURGERY — ESOPHAGOGASTRODUODENOSCOPY (EGD) WITH PROPOFOL
Anesthesia: General

## 2018-01-19 MED ORDER — EPHEDRINE SULFATE 50 MG/ML IJ SOLN
INTRAMUSCULAR | Status: AC
  Filled 2018-01-19: qty 1

## 2018-01-19 MED ORDER — FENTANYL CITRATE (PF) 100 MCG/2ML IJ SOLN
INTRAMUSCULAR | Status: AC
  Filled 2018-01-19: qty 2

## 2018-01-19 MED ORDER — PROPOFOL 500 MG/50ML IV EMUL
INTRAVENOUS | Status: DC | PRN
  Administered 2018-01-19: 120 ug/kg/min via INTRAVENOUS

## 2018-01-19 MED ORDER — SODIUM CHLORIDE 0.9 % IV SOLN
INTRAVENOUS | Status: DC | PRN
  Administered 2018-01-19: 13:00:00 via INTRAVENOUS

## 2018-01-19 MED ORDER — MIDAZOLAM HCL 2 MG/2ML IJ SOLN
INTRAMUSCULAR | Status: AC
  Filled 2018-01-19: qty 2

## 2018-01-19 MED ORDER — LIDOCAINE HCL (CARDIAC) 20 MG/ML IV SOLN
INTRAVENOUS | Status: DC | PRN
  Administered 2018-01-19: 30 mg via INTRAVENOUS

## 2018-01-19 MED ORDER — FENTANYL CITRATE (PF) 100 MCG/2ML IJ SOLN
INTRAMUSCULAR | Status: DC | PRN
  Administered 2018-01-19: 50 ug via INTRAVENOUS

## 2018-01-19 MED ORDER — LIDOCAINE HCL (PF) 2 % IJ SOLN
INTRAMUSCULAR | Status: AC
  Filled 2018-01-19: qty 10

## 2018-01-19 MED ORDER — PROPOFOL 500 MG/50ML IV EMUL
INTRAVENOUS | Status: AC
  Filled 2018-01-19: qty 50

## 2018-01-19 MED ORDER — SODIUM CHLORIDE 0.9 % IV SOLN: INTRAVENOUS | Status: DC
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
## 2018-01-19 NOTE — Anesthesia Postprocedure Evaluation (Signed)
Anesthesia Post Note  Patient: [name]  Procedure(s) Performed: ESOPHAGOGASTRODUODENOSCOPY (EGD) WITH PROPOFOL (N/A )  Patient location during evaluation: Endoscopy Anesthesia Type: General Level of consciousness: awake and alert Pain management: pain level controlled Vital Signs Assessment: post-procedure vital signs reviewed and stable Respiratory status: spontaneous breathing, nonlabored ventilation, respiratory function stable and patient connected to nasal cannula oxygen Cardiovascular status: blood pressure returned to baseline and stable Postop Assessment: no apparent nausea or vomiting Anesthetic complications: no     Last Vitals:  Vitals:   01/19/18 1330 01/19/18 1340  BP: 114/63 124/65  Pulse: 77 75  Resp: 15 (!) 21  Temp:    SpO2: 97% 94%    Last Pain:  Vitals:   01/19/18 1320  TempSrc: Tympanic  PainSc: Asleep                 Precious Haws Cheryln Balcom

## 2018-01-19 NOTE — Anesthesia Post-op Follow-up Note (Signed)
Anesthesia QCDR form completed.        

## 2018-01-19 NOTE — Transfer of Care (Signed)
Immediate Anesthesia Transfer of Care Note  Patient: [name]  Procedure(s) Performed: ESOPHAGOGASTRODUODENOSCOPY (EGD) WITH PROPOFOL (N/A )  Patient Location: PACU  Anesthesia Type:General  Level of Consciousness: awake and sedated  Airway & Oxygen Therapy: Patient Spontanous Breathing and Patient connected to nasal cannula oxygen  Post-op Assessment: Report given to RN and Post -op Vital signs reviewed and stable  Post vital signs: Reviewed  Last Vitals:  Vitals:   01/19/18 1219 01/19/18 1237  BP: 122/64 122/64  Pulse: 64 64  Resp: 17 20  Temp: 36.7 C 36.7 C  SpO2: 98% 98%    Last Pain:  Vitals:   01/19/18 1237  TempSrc: Tympanic  PainSc:          Complications: No apparent anesthesia complications

## 2018-01-19 NOTE — Anesthesia Procedure Notes (Signed)
Performed by: Cook-Martin, Jianna Drabik Pre-anesthesia Checklist: Patient identified, Emergency Drugs available, Suction available, Patient being monitored and Timeout performed Patient Re-evaluated:Patient Re-evaluated prior to induction Oxygen Delivery Method: Nasal cannula Preoxygenation: Pre-oxygenation with 100% oxygen Induction Type: IV induction Placement Confirmation: positive ETCO2 and CO2 detector       

## 2018-01-19 NOTE — H&P (Signed)
Outpatient short stay form Pre-procedure 01/19/2018 1:00 PM Lollie Sails MD  Primary Physician: [name], MD  Reason for visit: EGD  History of present illness: GERD, dysphagia.  Patient is a 67 year old female presenting today as above.  Patient has a chronic history of dysphagia seemingly worse over the period past 6 months particularly with dry foods but also with liquids at times.  She has had a recent barium swallow that was normal.  She does have a history of a Nissen fundoplication.  She does not regurgitate foods.  She rarely has any pill dysphagia.  Her fundoplication was in 5053.  She does take Mobic.  She is a smoker.    Current Facility-Administered Medications:  .  0.9 %  sodium chloride infusion, , Intravenous, Continuous, Lollie Sails, MD  Medications Prior to Admission  Medication Sig Dispense Refill Last Dose  . Biotin 10000 MCG TABS Take 1 tablet by mouth daily.   Taking  . Calcium Carbonate-Vitamin D (CALCIUM HIGH POTENCY/VITAMIN D) 600-200 MG-UNIT TABS Take by mouth.   Taking  . carisoprodol (SOMA) 350 MG tablet Take 350 mg by mouth 2 (two) times daily as needed for muscle spasms.   Taking  . cetirizine (ZYRTEC) 10 MG tablet Take 1 tablet (10 mg total) daily by mouth. 90 tablet 3 Taking  . clopidogrel (PLAVIX) 75 MG tablet Take 37.5 mg by mouth daily.   01/13/2018  . EPINEPHrine 0.3 mg/0.3 mL IJ SOAJ injection    Taking  . flecainide (TAMBOCOR) 150 MG tablet Take 75 mg by mouth 2 (two) times daily.    01/19/2018 at 0700  . HYDROcodone-acetaminophen (NORCO) 10-325 MG tablet Take 1 tablet every 6 (six) hours as needed by mouth.    Taking  . Iodine, Kelp, (KELP PO) Take 1 tablet by mouth daily.   Taking  . LYRICA 150 MG capsule Take 150 mg by mouth 3 (three) times daily.    0700  . metoprolol succinate (TOPROL-XL) 50 MG 24 hr tablet Take 12.5 mg daily by mouth.    01/19/2018 at 0700  . Misc Natural Products (TART CHERRY ADVANCED) CAPS Take 1 capsule by mouth  daily.    Taking  . omega-3 acid ethyl esters (LOVAZA) 1 g capsule Take by mouth.   Taking  . pravastatin (PRAVACHOL) 20 MG tablet Take 20 mg by mouth daily.   01/19/2018 at 0700  . rosuvastatin (CRESTOR) 10 MG tablet Take 10 mg by mouth daily.   Taking  . Tiotropium Bromide Monohydrate (SPIRIVA RESPIMAT) 2.5 MCG/ACT AERS INHALE 2 PUFFS EVERY DAY 12 g 0 Taking  . traZODone (DESYREL) 50 MG tablet TAKE 1 TABLET AT BEDTIME 90 tablet 1 Taking     Allergies  Allergen Reactions  . Aspirin Hives and Itching  . Diphenhydramine Hcl Other (See Comments), Swelling and Hives  . Salicin Swelling and Hives  . Tramadol Hcl Other (See Comments)    Lowers BP  . Willow Bark [White Willow Bark] Hives, Itching and Swelling    Requires EPI PEN. Swelling of throat, tongue.   Leta Speller Leaf Swallow Wort Rhizome Hives, Itching and Swelling    Requires EPI PEN, Welling of throat, tongue.  . Benadryl [Diphenhydramine Hcl] Itching    Makes patient fell hyper.  . Codeine     Patient also states she is allergic to steroids.  Marland Kitchen Cymbalta [Duloxetine Hcl] Other (See Comments)    Agitation, poor sleep  . Other Other (See Comments)    All steroids -  makes blood pressure drop and she feels like she is bottoming out  . Prednisone     All steroids  . Promethazine Other (See Comments)  . Tramadol Other (See Comments)  . Fluorometholone Swelling    Eye drops caused lid swelling and itching  . Promethazine Hcl Anxiety    Pt. States hallucinations and anxiety  . Wellbutrin [Bupropion] Nausea Only     Past Medical History:  Diagnosis Date  . Acute GI bleeding 01/28/2012  . Anemia due to blood loss, acute 01/28/2012  . Aortic mural thrombus ([place]) 01/28/2012   Per CT of the abdomen  . Arthritis    "qwhere; hands, feet, overall stiffness" (10/20/2013)  . Cancer ([place])    lung cancer  . Chronic bronchitis ([place])   . Chronic lower back pain   . Complication of anesthesia    "lungs quit working during [place] in [place]"  (10/20/2013); pt. states that she can't breathe after surgery when laying on back  . COPD (chronic obstructive pulmonary disease) ([place])   . Coronary artery disease   . Daily headache    Patient stated they are felt in back of the head, not throbing. But always in same spot. MRI's done, no reason why they occur. (10/20/2013)  . Depression   . Diastolic dysfunction 1/61/0960   Grade 1  . Diverticulitis    pt reports 8 times. Dr. Geroge Baseman colectomy in 2009  . Diverticulosis 2008   diagnosed; pt. states now cured 07/19/15  . Dysrhythmia    pt. states not since ablation  . Fibromyalgia   . Fracture 2006   left foot & ankle , immobilized for healing   . Gout    Recently diagnosed.  Marland Kitchen HEARING LOSS    since age 55  . Hepatitis C 1993    Needs Hepatic panel every 6   months, treated for 1 year   . Hiatal hernia    "repaired"   . History of blood transfusion    "probably when I was young, when I was 15" (10/20/2013)  . History of pneumonia   . Hyperlipidemia 2001  . Hypertension 2001  . Menopause    per medical history form  . Night sweats    Per medical history form dated 05/02/11.  . On home oxygen therapy    "2L only at night" (10/20/2013); pt. currently not wearing O2 at night (07/19/15)  . Panic disorder    was followed by mental health  . Sleep apnea 2001   non compliant wit the use of the machine  . Sleep apnea    wear oxygen at bedtime.   . SOB (shortness of breath)    "after lying in bed, go to the bathroom; heart races & I'm SOB" (10/20/2013)  . SVT (supraventricular tachycardia) ([place])    s/p ablation 10-20-2013 by Dr Lovena Le  . Tinnitus 2006   disabling  . Wears dentures    Per medical history form dated 05/02/11.  . Wears glasses     Review of systems:      Physical Exam    Heart and lungs: Regular rate and rhythm without rub or gallop, lungs are bilaterally clear.    HEENT: Normocephalic atraumatic eyes are anicteric    Other:    Pertinant exam for  procedure: Soft nontender nondistended bowel sounds positive normoactive.    Planned proceedures: EGD and indicated procedures. I have discussed the risks benefits and complications of procedures to include not limited to bleeding, infection, perforation and  the risk of sedation and the patient wishes to proceed.    Lollie Sails, MD Gastroenterology 01/19/2018  1:00 PM

## 2018-01-19 NOTE — Op Note (Signed)
[facility] Gastroenterology Patient Name: [name] Procedure Date: 01/19/2018 1:02 PM MRN: [id number] Account #: [id number] Date of Birth: August 02, 1951 Admit Type: Outpatient Age: 67 Room: [facility] ENDO ROOM 3 Gender: Female Note Status: Finalized Procedure:            Upper GI endoscopy Indications:          Dysphagia, Gastro-esophageal reflux disease Providers:            Lollie Sails, MD Referring MD:         Norwood Levo. Moshe Cipro MD, MD (Referring MD) Medicines:            Monitored Anesthesia Care Complications:        No immediate complications. Procedure:            Pre-Anesthesia Assessment:                       - ASA Grade Assessment: III - A patient with severe                        systemic disease.                       After obtaining informed consent, the endoscope was                        passed under direct vision. Throughout the procedure,                        the patient's blood pressure, pulse, and oxygen                        saturations were monitored continuously. The Endoscope                        was introduced through the mouth, and advanced to the                        third part of duodenum. The upper GI endoscopy was                        accomplished without difficulty. The patient tolerated                        the procedure well. Findings:      The Z-line was variable. Biopsies were taken with a cold forceps for       histology.      LA Grade A (one or more mucosal breaks less than 5 mm, not extending       between tops of 2 mucosal folds) esophagitis with no bleeding was found.      Evidence of a Nissen fundoplication was found in the cardia. The wrap       appeared loose.      A small hiatal hernia was present.      Patchy mild inflammation characterized by congestion (edema) and       erythema was found in the gastric body and in the gastric antrum.       Biopsies were taken with a cold forceps for histology.  Biopsies were       taken with a cold forceps for Helicobacter pylori testing.  One non-bleeding superficial gastric ulcer with no stigmata of bleeding       was found at the incisura. The lesion was 2 mm in largest dimension.      The examined duodenum was normal. Impression:           - Z-line variable. Biopsied.                       - LA Grade A esophagitis.                       - A Nissen fundoplication was found. The wrap appears                        loose.                       - Small hiatal hernia.                       - Erosive gastritis. Biopsied.                       - Non-bleeding gastric ulcer with no stigmata of                        bleeding.                       - Normal examined duodenum. Recommendation:       - Advance diet as tolerated.                       - Use Protonix (pantoprazole) 40 mg PO BID for 6 weeks.                       - Use Protonix (pantoprazole) 40 mg PO daily daily.                       - Repeat upper endoscopy in 2 months to check healing.                       - Await pathology results.                       - Return to GI clinic in 4 weeks.                       - Perform a modified barium swallow at appointment to                        be scheduled. Procedure Code(s):    --- Professional ---                       [phone], Esophagogastroduodenoscopy, flexible, transoral;                        with biopsy, single or multiple Diagnosis Code(s):    --- Professional ---                       K22.8, Other specified diseases of esophagus  K20.9, Esophagitis, unspecified                       Z98.890, Other specified postprocedural states                       K44.9, Diaphragmatic hernia without obstruction or                        gangrene                       K29.60, Other gastritis without bleeding                       K25.9, Gastric ulcer, unspecified as acute or chronic,                        without  hemorrhage or perforation                       R13.10, Dysphagia, unspecified                       K21.9, Gastro-esophageal reflux disease without                        esophagitis CPT copyright 2016 American Medical Association. All rights reserved. The codes documented in this report are preliminary and upon coder review may  be revised to meet current compliance requirements. Lollie Sails, MD 01/19/2018 1:21:11 PM This report has been signed electronically. Number of Addenda: 0 Note Initiated On: 01/19/2018 1:02 PM      [facility]

## 2018-01-20 ENCOUNTER — Other Ambulatory Visit: Payer: Self-pay | Admitting: Gastroenterology

## 2018-01-20 ENCOUNTER — Encounter: Payer: Self-pay | Admitting: Gastroenterology

## 2018-01-20 DIAGNOSIS — R1312 Dysphagia, oropharyngeal phase: Secondary | ICD-10-CM

## 2018-01-21 LAB — SURGICAL PATHOLOGY

## 2018-02-01 DIAGNOSIS — R252 Cramp and spasm: Secondary | ICD-10-CM | POA: Diagnosis not present

## 2018-02-01 DIAGNOSIS — G894 Chronic pain syndrome: Secondary | ICD-10-CM | POA: Diagnosis not present

## 2018-02-01 DIAGNOSIS — Z79899 Other long term (current) drug therapy: Secondary | ICD-10-CM | POA: Diagnosis not present

## 2018-02-01 DIAGNOSIS — M25571 Pain in right ankle and joints of right foot: Secondary | ICD-10-CM | POA: Diagnosis not present

## 2018-02-01 DIAGNOSIS — M545 Low back pain: Secondary | ICD-10-CM | POA: Diagnosis not present

## 2018-02-01 DIAGNOSIS — G4700 Insomnia, unspecified: Secondary | ICD-10-CM | POA: Diagnosis not present

## 2018-02-01 DIAGNOSIS — M25551 Pain in right hip: Secondary | ICD-10-CM | POA: Diagnosis not present

## 2018-02-02 ENCOUNTER — Ambulatory Visit: Payer: Medicare Other | Admitting: Allergy & Immunology

## 2018-02-05 ENCOUNTER — Telehealth: Payer: Self-pay

## 2018-02-05 NOTE — Telephone Encounter (Signed)
Calling about Trazodone refill. Uses [facility]. Has already called into pharmacy but she hasn't received script in the mail. Please call [phone]

## 2018-02-08 ENCOUNTER — Other Ambulatory Visit: Payer: Self-pay

## 2018-02-08 MED ORDER — TRAZODONE HCL 50 MG PO TABS: 50.0000 mg | ORAL_TABLET | Freq: Every day | ORAL | 1 refills | Status: DC

## 2018-02-08 NOTE — Telephone Encounter (Signed)
Med refilled.

## 2018-02-11 ENCOUNTER — Ambulatory Visit
Admission: RE | Admit: 2018-02-11 | Discharge: 2018-02-11 | Disposition: A | Discharge status: Home / Self Care | Payer: Medicare Other | Source: Ambulatory Visit | Attending: Gastroenterology | Admitting: Gastroenterology

## 2018-02-11 DIAGNOSIS — R1312 Dysphagia, oropharyngeal phase: Secondary | ICD-10-CM

## 2018-02-11 DIAGNOSIS — R1314 Dysphagia, pharyngoesophageal phase: Secondary | ICD-10-CM

## 2018-02-11 DIAGNOSIS — R1310 Dysphagia, unspecified: Secondary | ICD-10-CM | POA: Diagnosis not present

## 2018-02-11 DIAGNOSIS — K219 Gastro-esophageal reflux disease without esophagitis: Secondary | ICD-10-CM | POA: Insufficient documentation
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
## 2018-02-15 DIAGNOSIS — M25551 Pain in right hip: Secondary | ICD-10-CM | POA: Diagnosis not present

## 2018-02-15 DIAGNOSIS — M25571 Pain in right ankle and joints of right foot: Secondary | ICD-10-CM | POA: Diagnosis not present

## 2018-02-15 DIAGNOSIS — G4700 Insomnia, unspecified: Secondary | ICD-10-CM | POA: Diagnosis not present

## 2018-02-15 DIAGNOSIS — Z79891 Long term (current) use of opiate analgesic: Secondary | ICD-10-CM | POA: Diagnosis not present

## 2018-02-18 ENCOUNTER — Other Ambulatory Visit: Payer: Self-pay | Admitting: Gastroenterology

## 2018-02-18 ENCOUNTER — Other Ambulatory Visit (HOSPITAL_COMMUNITY): Payer: Self-pay | Admitting: Gastroenterology

## 2018-02-18 DIAGNOSIS — R1310 Dysphagia, unspecified: Secondary | ICD-10-CM

## 2018-02-18 DIAGNOSIS — R1032 Left lower quadrant pain: Secondary | ICD-10-CM

## 2018-02-18 DIAGNOSIS — Z8719 Personal history of other diseases of the digestive system: Secondary | ICD-10-CM | POA: Diagnosis not present

## 2018-02-18 DIAGNOSIS — Z859 Personal history of malignant neoplasm, unspecified: Secondary | ICD-10-CM

## 2018-02-18 DIAGNOSIS — Z85118 Personal history of other malignant neoplasm of bronchus and lung: Secondary | ICD-10-CM | POA: Diagnosis not present

## 2018-02-18 DIAGNOSIS — K219 Gastro-esophageal reflux disease without esophagitis: Secondary | ICD-10-CM | POA: Diagnosis not present

## 2018-02-23 ENCOUNTER — Other Ambulatory Visit: Payer: Medicare Other | Admitting: Pediatrics

## 2018-02-24 ENCOUNTER — Other Ambulatory Visit: Payer: Self-pay

## 2018-02-24 ENCOUNTER — Ambulatory Visit (INDEPENDENT_AMBULATORY_CARE_PROVIDER_SITE_OTHER): Payer: Medicare Other | Admitting: Family Medicine

## 2018-02-24 ENCOUNTER — Encounter: Payer: Self-pay | Admitting: Family Medicine

## 2018-02-24 ENCOUNTER — Telehealth: Payer: Self-pay

## 2018-02-24 VITALS — BP 104/52 | HR 64 | Ht 62.75 in | Wt 139.0 lb

## 2018-02-24 DIAGNOSIS — F17210 Nicotine dependence, cigarettes, uncomplicated: Secondary | ICD-10-CM | POA: Diagnosis not present

## 2018-02-24 DIAGNOSIS — E785 Hyperlipidemia, unspecified: Secondary | ICD-10-CM | POA: Diagnosis not present

## 2018-02-24 DIAGNOSIS — M2550 Pain in unspecified joint: Secondary | ICD-10-CM

## 2018-02-24 DIAGNOSIS — K2930 Chronic superficial gastritis without bleeding: Secondary | ICD-10-CM

## 2018-02-24 DIAGNOSIS — F17200 Nicotine dependence, unspecified, uncomplicated: Secondary | ICD-10-CM

## 2018-02-24 DIAGNOSIS — F418 Other specified anxiety disorders: Secondary | ICD-10-CM | POA: Diagnosis not present

## 2018-02-24 DIAGNOSIS — M25579 Pain in unspecified ankle and joints of unspecified foot: Secondary | ICD-10-CM | POA: Diagnosis not present

## 2018-02-24 DIAGNOSIS — H04123 Dry eye syndrome of bilateral lacrimal glands: Secondary | ICD-10-CM | POA: Diagnosis not present

## 2018-02-24 DIAGNOSIS — M79641 Pain in right hand: Secondary | ICD-10-CM

## 2018-02-24 DIAGNOSIS — M79642 Pain in left hand: Secondary | ICD-10-CM

## 2018-02-24 DIAGNOSIS — G8929 Other chronic pain: Secondary | ICD-10-CM

## 2018-02-24 MED ORDER — PAROXETINE HCL 20 MG PO TABS: 20.0000 mg | ORAL_TABLET | Freq: Every day | ORAL | 1 refills | Status: DC

## 2018-02-24 NOTE — Patient Instructions (Addendum)
F/u in 2.5 months, call if you need me sooner  You are referred to therapist who will call you on the phone and new medication for depression is started paxil  We will try to authorize voltaren  You will be referred to rheumlatologist  Will send in for lab work once chart is reviewed Need to work on  stopping smoking

## 2018-02-24 NOTE — Progress Notes (Signed)
[name]     MRN: [id number]      DOB: [birth year]   HPI [name] is here for follow up and re-evaluation of chronic medical conditions, medication management and review of any available recent lab and radiology data.  Preventive health is updated, specifically  Cancer screening and Immunization.   Recent upper endo shows inflammation and she reports that she wil have rept upper endo and colonoscopy in June 2019. Very afraid of recurrent lung cancer or a new cancer currently C/o left lower abdominal discomfort where she has ahd bowel rection in the past, she is to have scan through GI for this  Has chest scan scheduled for 4/26/ 2019 C/iO uncontrolled pain and will see [name] in early May Her insurance will not cover voltaren gel however 4/9 upper endo shows gastritis and esophagitis , so will try to cover  C/o uncontrolled generalized pain, fingers are swollen with weak grips right hip pain and neuropathic pain Depressed and overwhelmed, not suicidal or homicidal  ROS Denies recent fever or chills. Denies sinus pressure, nasal congestion, ear pain or sore throat. Denies chest congestion, productive cough or wheezing. Denies chest pains, palpitations and leg swelling Denies abdominal pain, nausea, vomiting,diarrhea or constipation.   Denies dysuria, frequency, hesitancy or incontinence. . Denies skin break down or rash.   PE  BP (!) 104/52   Pulse 64   Ht 5' 2.75" (1.594 m)   Wt 139 lb 0.6 oz (63.1 kg)   BMI 24.83 kg/m   Patient alert and oriented and in no cardiopulmonary distress.  HEENT: No facial asymmetry, EOMI,   oropharynx pink and moist.  Neck supple no JVD, no mass.  Chest: Clear to auscultation bilaterally.decreased air entry throughout  CVS: S1, S2 no murmurs, no S3.Regular rate.  ABD: Soft non tender.   Ext: No edema  MS: Decreased  ROM spine, shoulders, hips and knees. Deformity of digits with markedly reduced grip  Skin: Intact, no  ulcerations or rash noted.  Psych: Good eye contact, tearful and anxious  affect. Memory intact  depressed appearing.  CNS: CN 2-12 intact, power,  normal throughout.no focal deficits noted.   Assessment & Plan  POLYARTHRITIS Polyarthropathy and polyarthritis of all digits of both hands with bilateral weak grip, also multiple tender spots and generalized pain , as well as debilitating ankle and hip pain  With reduced mobility needs evaluation by rheumatology for underlying disease and appropriate care , will refer  Depression with anxiety Resume Paxil and refer to psychotherapy via telephone  Chronically dry eyes, bilateral Improved with treatment  NICOTINE ADDICTION Asked : currently smokes cigarettes, over 10/day Assess; wants to quit but feels incapable currently very anxious and depressed and reports the cigarettes are a part of her coping tool Advised: needs to quit as has personal h/o lung cancer and CAD and emphysema all f which she is aware , and these are the reasons she already knows why she should quit Assist" 1800QUIT NOW # provided and encouraged use, also counseled for 5 minutes  Arrange 2.5 month follow up  Gastritis Recent endoscopy shows gastritis. She benefits from topical anti inflammatory voltaren gel and is requesting assistance to have this Prior authorized so that she can have it for use  Hyperlipemia Hyperlipidemia:Low fat diet discussed and encouraged.   Lipid Panel  Lab Results  Component Value Date   CHOL 268 (H) 09/11/2017   HDL 47 09/11/2017   LDLCALC 207 (H) 09/11/2017   TRIG  71 09/11/2017   CHOLHDL 5.7 09/11/2017   Updated lab needed at/ before next visit. Uncontrolled , low fat diet needs to be followed

## 2018-02-24 NOTE — Telephone Encounter (Signed)
[facility] left a voice mail message   

## 2018-03-02 ENCOUNTER — Telehealth: Payer: Self-pay

## 2018-03-02 NOTE — Telephone Encounter (Signed)
[facility] - left message.

## 2018-03-04 DIAGNOSIS — I493 Ventricular premature depolarization: Secondary | ICD-10-CM | POA: Diagnosis not present

## 2018-03-04 DIAGNOSIS — I10 Essential (primary) hypertension: Secondary | ICD-10-CM | POA: Diagnosis not present

## 2018-03-04 DIAGNOSIS — I2510 Atherosclerotic heart disease of native coronary artery without angina pectoris: Secondary | ICD-10-CM | POA: Diagnosis not present

## 2018-03-04 DIAGNOSIS — I480 Paroxysmal atrial fibrillation: Secondary | ICD-10-CM | POA: Diagnosis not present

## 2018-03-05 ENCOUNTER — Ambulatory Visit (HOSPITAL_COMMUNITY)
Admission: RE | Admit: 2018-03-05 | Discharge: 2018-03-05 | Disposition: A | Discharge status: Home / Self Care | Payer: Medicare Other | Source: Ambulatory Visit | Attending: Internal Medicine | Admitting: Internal Medicine

## 2018-03-05 ENCOUNTER — Other Ambulatory Visit (HOSPITAL_COMMUNITY)
Admission: RE | Admit: 2018-03-05 | Discharge: 2018-03-05 | Disposition: A | Discharge status: Home / Self Care | Payer: Medicare Other | Source: Ambulatory Visit | Attending: Internal Medicine | Admitting: Internal Medicine

## 2018-03-05 DIAGNOSIS — C3490 Malignant neoplasm of unspecified part of unspecified bronchus or lung: Secondary | ICD-10-CM | POA: Diagnosis not present

## 2018-03-05 DIAGNOSIS — J432 Centrilobular emphysema: Secondary | ICD-10-CM | POA: Insufficient documentation

## 2018-03-05 DIAGNOSIS — C3491 Malignant neoplasm of unspecified part of right bronchus or lung: Secondary | ICD-10-CM | POA: Diagnosis not present

## 2018-03-05 DIAGNOSIS — I700 Atherosclerosis of aorta: Secondary | ICD-10-CM | POA: Insufficient documentation

## 2018-03-05 LAB — CBC WITH DIFFERENTIAL/PLATELET
Basophils Absolute: 0 10*3/uL (ref 0.0–0.1)
Basophils Relative: 0 %
EOS PCT: 3 %
Eosinophils Absolute: 0.3 10*3/uL (ref 0.0–0.7)
HEMATOCRIT: 40.8 % (ref 36.0–46.0)
Hemoglobin: 13 g/dL (ref 12.0–15.0)
LYMPHS ABS: 1.9 10*3/uL (ref 0.7–4.0)
LYMPHS PCT: 21 %
MCH: 29.4 pg (ref 26.0–34.0)
MCHC: 31.9 g/dL (ref 30.0–36.0)
MCV: 92.3 fL (ref 78.0–100.0)
MONO ABS: 0.8 10*3/uL (ref 0.1–1.0)
Monocytes Relative: 9 %
NEUTROS ABS: 6.2 10*3/uL (ref 1.7–7.7)
Neutrophils Relative %: 67 %
PLATELETS: 201 10*3/uL (ref 150–400)
RBC: 4.42 MIL/uL (ref 3.87–5.11)
RDW: 14.3 % (ref 11.5–15.5)
WBC: 9.2 10*3/uL (ref 4.0–10.5)

## 2018-03-05 LAB — COMPREHENSIVE METABOLIC PANEL
ALBUMIN: 3.7 g/dL (ref 3.5–5.0)
ALT: 15 U/L (ref 14–54)
ANION GAP: 8 (ref 5–15)
AST: 20 U/L (ref 15–41)
Alkaline Phosphatase: 57 U/L (ref 38–126)
BUN: 14 mg/dL (ref 6–20)
CHLORIDE: 102 mmol/L (ref 101–111)
CO2: 27 mmol/L (ref 22–32)
Calcium: 8.7 mg/dL — ABNORMAL LOW (ref 8.9–10.3)
Creatinine, Ser: 0.62 mg/dL (ref 0.44–1.00)
GFR calc Af Amer: >60 mL/min (ref >60)
GFR calc non Af Amer: >60 mL/min (ref >60)
GLUCOSE: 100 mg/dL — AB (ref 65–99)
POTASSIUM: 4.6 mmol/L (ref 3.5–5.1)
Sodium: 137 mmol/L (ref 135–145)
Total Bilirubin: 0.5 mg/dL (ref 0.3–1.2)
Total Protein: 6.7 g/dL (ref 6.5–8.1)

## 2018-03-05 LAB — POCT I-STAT CREATININE: CREATININE: 0.7 mg/dL (ref 0.44–1.00)

## 2018-03-05 MED ORDER — IOPAMIDOL (ISOVUE-300) INJECTION 61%
100.0000 mL | Freq: Once | INTRAVENOUS | Status: AC | PRN
  Administered 2018-03-05: 75 mL via INTRAVENOUS

## 2018-03-09 ENCOUNTER — Other Ambulatory Visit: Payer: Self-pay | Admitting: Family Medicine

## 2018-03-09 ENCOUNTER — Encounter: Payer: Self-pay | Admitting: Family Medicine

## 2018-03-09 ENCOUNTER — Telehealth: Payer: Self-pay | Admitting: Family Medicine

## 2018-03-09 DIAGNOSIS — Z13220 Encounter for screening for lipoid disorders: Secondary | ICD-10-CM

## 2018-03-09 DIAGNOSIS — E785 Hyperlipidemia, unspecified: Secondary | ICD-10-CM

## 2018-03-09 MED ORDER — DICLOFENAC SODIUM 1 % TD GEL: TRANSDERMAL | 3 refills | Status: DC

## 2018-03-09 NOTE — Assessment & Plan Note (Signed)
Polyarthropathy and polyarthritis of all digits of both hands with bilateral weak grip, also multiple tender spots and generalized pain , as well as debilitating ankle and hip pain  With reduced mobility needs evaluation by rheumatology for underlying disease and appropriate care , will refer

## 2018-03-09 NOTE — Assessment & Plan Note (Signed)
Hyperlipidemia:Low fat diet discussed and encouraged.   Lipid Panel  Lab Results  Component Value Date   CHOL 268 (H) 09/11/2017   HDL 47 09/11/2017   LDLCALC 207 (H) 09/11/2017   TRIG 71 09/11/2017   CHOLHDL 5.7 09/11/2017   Updated lab needed at/ before next visit. Uncontrolled , low fat diet needs to be followed

## 2018-03-09 NOTE — Telephone Encounter (Signed)
Please try to get prior authorization for patient for voltaren gel, which I am prescribing for her for generalized pain. She does have recent upper endoscopy showing that she does have gastritis and she does have a h/o intestinal bleeding  Also please let her know that the labs that she does need is a fasting lipid panel whenever she is next to get labs drawn electively she does have an appt for f/u in 2.5 months in June and that is good enough if no labs plkanned before

## 2018-03-09 NOTE — Assessment & Plan Note (Signed)
Recent endoscopy shows gastritis. She benefits from topical anti inflammatory voltaren gel and is requesting assistance to have this Prior authorized so that she can have it for use

## 2018-03-09 NOTE — Assessment & Plan Note (Signed)
Improved with treatment

## 2018-03-09 NOTE — Assessment & Plan Note (Signed)
Asked : currently smokes cigarettes, over 10/day Assess; wants to quit but feels incapable currently very anxious and depressed and reports the cigarettes are a part of her coping tool Advised: needs to quit as has personal h/o lung cancer and CAD and emphysema all f which she is aware , and these are the reasons she already knows why she should quit Assist" 1800QUIT NOW # provided and encouraged use, also counseled for 5 minutes  Arrange 2.5 month follow up

## 2018-03-09 NOTE — Assessment & Plan Note (Signed)
Resume Paxil and refer to psychotherapy via telephone

## 2018-03-11 ENCOUNTER — Ambulatory Visit: Payer: Medicare Other | Admitting: Internal Medicine

## 2018-03-11 ENCOUNTER — Inpatient Hospital Stay: Payer: Medicare Other | Attending: Internal Medicine | Admitting: Internal Medicine

## 2018-03-11 ENCOUNTER — Encounter: Payer: Self-pay | Admitting: Internal Medicine

## 2018-03-11 ENCOUNTER — Telehealth: Payer: Self-pay | Admitting: Internal Medicine

## 2018-03-11 ENCOUNTER — Other Ambulatory Visit: Payer: Medicare Other

## 2018-03-11 ENCOUNTER — Other Ambulatory Visit: Payer: Self-pay | Admitting: Internal Medicine

## 2018-03-11 VITALS — BP 144/66 | HR 60 | Temp 98.2°F | Resp 17 | Ht 62.75 in | Wt 138.6 lb

## 2018-03-11 DIAGNOSIS — C3431 Malignant neoplasm of lower lobe, right bronchus or lung: Secondary | ICD-10-CM | POA: Diagnosis not present

## 2018-03-11 DIAGNOSIS — I2510 Atherosclerotic heart disease of native coronary artery without angina pectoris: Secondary | ICD-10-CM | POA: Diagnosis not present

## 2018-03-11 DIAGNOSIS — I10 Essential (primary) hypertension: Secondary | ICD-10-CM | POA: Insufficient documentation

## 2018-03-11 DIAGNOSIS — C3491 Malignant neoplasm of unspecified part of right bronchus or lung: Secondary | ICD-10-CM

## 2018-03-11 DIAGNOSIS — J449 Chronic obstructive pulmonary disease, unspecified: Secondary | ICD-10-CM | POA: Insufficient documentation

## 2018-03-11 DIAGNOSIS — B1920 Unspecified viral hepatitis C without hepatic coma: Secondary | ICD-10-CM | POA: Diagnosis not present

## 2018-03-11 DIAGNOSIS — C3490 Malignant neoplasm of unspecified part of unspecified bronchus or lung: Secondary | ICD-10-CM
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
## 2018-03-11 NOTE — Telephone Encounter (Signed)
Appointments scheduled AVS/Calendar printed per 5/2 los °

## 2018-03-12 ENCOUNTER — Telehealth: Payer: Self-pay

## 2018-03-12 NOTE — Telephone Encounter (Signed)
Patient called and lvm on nurses line stating insurance wouldn't cover Paxil and they would cover an alternative.   I returned the patients call to discuss the Paxil. She stated they would cover Paroxetine. I discussed with her that this is what had been sent in and that Paxil is just the brand name. I told the patient I would call [place] and see what was wrong and follow up with her.   Miss Gwilliam said she had her CT done and was cancer free. I congratulated her. She stated that on her CT it stated her heart was enlarged and it hadn't stated that on her previous CT scan. She is concerned about this.  She had an EKG done a week ago and the doctor said the EKG read she had a previous heart attack. I was having a difficult time following her regarding what the doctor said about her EKG. Her EKG is not in her chart. She would like to discuss these with Dr.Simpson.  After speaking with Miss Long, I called [facility] to see if I could find out if there was an issue with the prescription for Paroxetine. The pharmacy tech stated that the prescription had been covered, filled, and shipped out to the patient on April 18,2019 and was delivered on February 27, 2018. The pharmacy gave me the number for the patient to call if she hadn't received the prescription. I called the patient back to let her know what I had found out and she stated she hadn't received the medication. I gave her the number [phone]) to call and see about getting the medication reshipped.

## 2018-03-15 DIAGNOSIS — M25571 Pain in right ankle and joints of right foot: Secondary | ICD-10-CM | POA: Diagnosis not present

## 2018-03-15 DIAGNOSIS — M25551 Pain in right hip: Secondary | ICD-10-CM | POA: Diagnosis not present

## 2018-03-15 DIAGNOSIS — Z79891 Long term (current) use of opiate analgesic: Secondary | ICD-10-CM | POA: Diagnosis not present

## 2018-03-15 DIAGNOSIS — G894 Chronic pain syndrome: Secondary | ICD-10-CM | POA: Diagnosis not present

## 2018-03-15 NOTE — Telephone Encounter (Signed)
I spoke with [facility]. Script was filled and shipped. Miss Pettinato said she didn't receive it. I gave her a number to call and discuss about reshipping the prescription. I explained to her that the second name they gave her was the same as Paxil. She verbalized understanding.

## 2018-03-15 NOTE — Telephone Encounter (Signed)
noted 

## 2018-03-15 NOTE — Telephone Encounter (Signed)
Please call back the patient, paroxetine is the same as paxil, so this should have been dispensed already , if this has not been dispensed yet , then please call the insurance company about this

## 2018-03-15 NOTE — Addendum Note (Signed)
Addended by: Eual Fines on: 03/15/2018 09:08 AM   Modules accepted: Orders

## 2018-03-16 NOTE — Telephone Encounter (Signed)
Mailed order to have labs done

## 2018-03-16 NOTE — Telephone Encounter (Signed)
I never received anything from the pharmacy that it needed a PA

## 2018-03-17 ENCOUNTER — Ambulatory Visit: Payer: Medicare Other | Admitting: Family Medicine

## 2018-03-23 ENCOUNTER — Telehealth: Payer: Self-pay | Admitting: Family Medicine
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
## 2018-03-29 ENCOUNTER — Other Ambulatory Visit: Payer: Self-pay | Admitting: Family Medicine

## 2018-03-29 DIAGNOSIS — G894 Chronic pain syndrome: Secondary | ICD-10-CM

## 2018-03-29 NOTE — Telephone Encounter (Signed)
Please try and refer to  Dr Nicholaus Bloom for pin management if he is unable to see her then we will seek another Pain specialist, please leyt pt know that I am sorry that she feels that way, I do not at all believe that was Dr Freddie Apley intent and that I am  Attempting to get her into a pain clinic, and we will be in touch as soon as we are able to establish a new Provider, in the nterim she will need to continue with dr Merlene Laughter, just not take the hydroxyzine

## 2018-03-29 NOTE — Progress Notes (Signed)
amb pain  

## 2018-04-01 ENCOUNTER — Telehealth: Payer: Self-pay | Admitting: Family Medicine

## 2018-04-01 NOTE — Telephone Encounter (Signed)
Please send to another Pain Management Dr--this one does not take her insurance

## 2018-04-07 ENCOUNTER — Telehealth: Payer: Self-pay | Admitting: Family Medicine

## 2018-04-07 DIAGNOSIS — H04121 Dry eye syndrome of right lacrimal gland: Secondary | ICD-10-CM | POA: Diagnosis not present

## 2018-04-07 DIAGNOSIS — Z961 Presence of intraocular lens: Secondary | ICD-10-CM | POA: Diagnosis not present

## 2018-04-07 NOTE — Telephone Encounter (Signed)
Per fax, [facility] does not except medicaid. I called patient to discuss, no answer, left message requesting a call back. I did however fax an appt request to Dr.Ramos per Dr.Simpsons request. Patient will need to obtain her own records from Dr.Doonquah if need be for this referral, she can ask [facility] about that.

## 2018-04-09 NOTE — Telephone Encounter (Signed)
Dr Nelva Bush is requesting records from dr Merlene Laughter

## 2018-04-14 ENCOUNTER — Telehealth: Payer: Self-pay

## 2018-04-14 NOTE — Telephone Encounter (Signed)
[facility] - left message

## 2018-04-15 ENCOUNTER — Ambulatory Visit: Payer: Medicare Other

## 2018-04-19 ENCOUNTER — Other Ambulatory Visit (HOSPITAL_COMMUNITY)
Admission: RE | Admit: 2018-04-19 | Discharge: 2018-04-19 | Disposition: A | Discharge status: Home / Self Care | Payer: Medicare Other | Source: Ambulatory Visit | Attending: Family Medicine | Admitting: Family Medicine

## 2018-04-19 ENCOUNTER — Ambulatory Visit (HOSPITAL_COMMUNITY)
Admission: RE | Admit: 2018-04-19 | Discharge: 2018-04-19 | Disposition: A | Discharge status: Home / Self Care | Payer: Medicare Other | Source: Ambulatory Visit | Attending: Gastroenterology | Admitting: Gastroenterology

## 2018-04-19 DIAGNOSIS — E785 Hyperlipidemia, unspecified: Secondary | ICD-10-CM | POA: Diagnosis present

## 2018-04-19 DIAGNOSIS — I2510 Atherosclerotic heart disease of native coronary artery without angina pectoris: Secondary | ICD-10-CM | POA: Diagnosis not present

## 2018-04-19 DIAGNOSIS — I700 Atherosclerosis of aorta: Secondary | ICD-10-CM | POA: Insufficient documentation

## 2018-04-19 DIAGNOSIS — Z8719 Personal history of other diseases of the digestive system: Secondary | ICD-10-CM | POA: Diagnosis present

## 2018-04-19 DIAGNOSIS — R1032 Left lower quadrant pain: Secondary | ICD-10-CM | POA: Insufficient documentation

## 2018-04-19 DIAGNOSIS — K5730 Diverticulosis of large intestine without perforation or abscess without bleeding: Secondary | ICD-10-CM | POA: Insufficient documentation

## 2018-04-19 DIAGNOSIS — K5732 Diverticulitis of large intestine without perforation or abscess without bleeding: Secondary | ICD-10-CM | POA: Diagnosis not present

## 2018-04-19 LAB — LIPID PANEL
CHOL/HDL RATIO: 3.2 ratio
CHOLESTEROL: 180 mg/dL (ref 0–200)
HDL: 57 mg/dL (ref >40)
LDL Cholesterol: 104 mg/dL — ABNORMAL HIGH (ref 0–99)
TRIGLYCERIDES: 97 mg/dL (ref <150)
VLDL: 19 mg/dL (ref 0–40)

## 2018-04-19 LAB — POCT I-STAT CREATININE: CREATININE: 0.6 mg/dL (ref 0.44–1.00)

## 2018-04-19 MED ORDER — IOPAMIDOL (ISOVUE-300) INJECTION 61%
100.0000 mL | Freq: Once | INTRAVENOUS | Status: AC | PRN
  Administered 2018-04-19: 100 mL via INTRAVENOUS

## 2018-04-21 ENCOUNTER — Ambulatory Visit (INDEPENDENT_AMBULATORY_CARE_PROVIDER_SITE_OTHER): Payer: Medicare Other | Admitting: Family Medicine

## 2018-04-21 ENCOUNTER — Encounter: Payer: Self-pay | Admitting: *Deleted

## 2018-04-21 ENCOUNTER — Encounter: Payer: Self-pay | Admitting: Family Medicine

## 2018-04-21 ENCOUNTER — Other Ambulatory Visit: Payer: Self-pay

## 2018-04-21 VITALS — BP 128/70 | HR 82 | Resp 16 | Ht 63.0 in | Wt 137.8 lb

## 2018-04-21 DIAGNOSIS — G894 Chronic pain syndrome: Secondary | ICD-10-CM

## 2018-04-21 DIAGNOSIS — F17200 Nicotine dependence, unspecified, uncomplicated: Secondary | ICD-10-CM

## 2018-04-21 DIAGNOSIS — F418 Other specified anxiety disorders: Secondary | ICD-10-CM

## 2018-04-21 DIAGNOSIS — I480 Paroxysmal atrial fibrillation: Secondary | ICD-10-CM

## 2018-04-21 MED ORDER — ROSUVASTATIN CALCIUM 10 MG PO TABS: 10.0000 mg | ORAL_TABLET | Freq: Every day | ORAL | 1 refills | Status: DC

## 2018-04-21 MED ORDER — HYDROCODONE-ACETAMINOPHEN 7.5-325 MG PO TABS: ORAL_TABLET | ORAL | 0 refills | Status: DC

## 2018-04-21 MED ORDER — TIOTROPIUM BROMIDE MONOHYDRATE 2.5 MCG/ACT IN AERS: INHALATION_SPRAY | RESPIRATORY_TRACT | 3 refills | Status: DC

## 2018-04-21 MED ORDER — TRAZODONE HCL 50 MG PO TABS: 50.0000 mg | ORAL_TABLET | Freq: Every day | ORAL | 1 refills | Status: DC

## 2018-04-21 MED ORDER — CARISOPRODOL 350 MG PO TABS: ORAL_TABLET | ORAL | 1 refills | Status: DC

## 2018-04-21 NOTE — Patient Instructions (Signed)
F/u in 11 weeks, call if you need me before  eKG is normal  Temporary pain contract until I can establish youi with a new pain Doctor. You may need to see  A [place] Doc we wioll ;let you know . We are following up on rheumatology referral and will call you  Please work on quitting smoking

## 2018-04-21 NOTE — Progress Notes (Signed)
MAKENZEE CHOUDHRY     MRN: [id number]      DOB: [birth year]   HPI Ms. Everly is here for follow up states she has had adverse reactions to pain management from may 3 . Was prescribed meloxicam despite GI bleed and hydroxyzine, . States she has had abnormality in eyes moving, hearing abnormality instead of hearing loss , she noise amplification, sense of smell exaggerated x 10 days, states could not move for 1 week , excess sweating, freezing cold, and chillsstates was unable to eat or drink, lost 8 pounds in 5 days, also change in sense of smell. Skin is hypersensitive on both legs States heart rhythm has changed since May 3, is supposed to hav GI studies has had ablation in the past c/o intermittent episodes of palpitations which are ongoing since her adverse reaction to meloxicam and hydroxyzine States she takes soma one at bedtime, and not twice daily as prescribed so I  will fil for July 6 . States that her dose of pain medication  Has been weaned by Dr Merlene Laughter and that she has had uncontrolled when she was weaned down to twice daily hydrocodone. She is requesting that I help to provide her with pain medication until sh can establish with a new [place] clinic as she  No longer wishes to see Dr Merlene Laughter feels that the medication last prescribed was inappropriate with her history of GI bleed and that he did not listen to her when she tried to explain herself Asking about her referral to rheumatologyu for joint pain and swelling ROS Denies recent fever or chills. Denies sinus pressure, nasal congestion, ear pain or sore throat. Denies chest congestion, productive cough or wheezing. Denies abdominal pain, nausea, vomiting,diarrhea or constipation.   Denies dysuria, frequency, hesitancy or incontinence. Denies headaches, seizures, numbness, or tingling.  Denies skin break down or rash.   PE  BP 128/70   Pulse 82   Resp 16   Ht 5\' 3"  (1.6 m)   Wt 137 lb 12.8 oz (62.5 kg)   SpO2 97%   BMI 24.41  kg/m   Patient alert and oriented and in no cardiopulmonary distress.  HEENT: No facial asymmetry, EOMI,   oropharynx pink and moist.  Neck supple no JVD, no mass.  Chest: Clear to auscultation bilaterally.  CVS: S1, S2 no murmurs, no S3.Regular rate. EKG: NSR, no ischemia, no LVH, rate 98 ABD: Soft non tender.   Ext: No edema  MS: Adequate though reduced ROM spine, shoulders, hips and knees.  Skin: Intact, no ulcerations or rash noted.  Psych: Good eye contact, normal affect. Memory intact not anxious or depressed appearing.  CNS: CN 2-12 intact, power,  normal throughout.no focal deficits noted.   Assessment & Plan  Paroxysmal a-fib (HCC) Pt reports recurrent symptoms in the past 3 weeks up to the present. EKG in the office shows NSR, rate of 98, clinical exam is consistent with patient who is clinically stable and has no cardiopulmonary compromise  Chronic pain syndrome Patient currently in the process of establishing with a new Provider, out of pain medication and reports inadequate control on twice daily hydrocodone when once she was on 4 times daily. Reports adverse reaction to meloxicam and hydroxyzine , h/o gIO bleed and has GI tests in am. Will bridge her short term with 3 times daily hydrocodone and once daily/ bedrtme soma . I have reviewed her narc registry. I am actively seeking new Pain management and she understands this Am interim  pain contract has been signed. I will notify Dr Merlene Laughter  NICOTINE ADDICTION Asked: confirm current nicotine use Assess: not willing to set quit date but wants to quit Advise: needs to quit, personal h/o lung cancer Arrange: [place] NOW# and general advice re smoking cessation benefits Arrange f/u in 11 weeks   Depression with anxiety Message sent to telepsych requesting that they contact pt on her home number

## 2018-04-21 NOTE — Progress Notes (Signed)
Fill Date ID Written Drug Qty Days Prescriber Rx # Pharmacy Refill Daily Dose * Pymt Type PMP  03/29/2018  1  03/15/2018  Hydrocodone-Acetamin 7.5-325  28 14 [name]  [id number]  [place] ([id number])  0 15.00 MME Comm Ins  [place]  03/15/2018  1  03/15/2018  Carisoprodol 350 Mg Tablet  60 30 [name]  [id number]  [place] ([id number])  0 0.56 LME Comm Ins  [place]  03/15/2018  1  03/15/2018  Hydrocodone-Acetamin 10-325 Mg  28 14 [name]  [id number]  [place] ([id number])  0 20.00 MME Comm Ins  [place]  02/15/2018  1  02/15/2018  Hydrocodone-Acetamin 10-325 Mg  105 26 [name]  [id number]  [place] ([id number])  0 40.38 MME Comm Ins  [place]  02/05/2018  1  02/05/2018  Oxycodon-Acetaminophen 7.5-325  90 30 [name]  [id number]  [place] ([id number])  0 33.75 MME Comm Ins  [place]  02/01/2018  1  02/01/2018  Carisoprodol 350 Mg Tablet  60 30 [name]  [id number]  [place] ([id number])  0 0.56 LME Comm Ins  [place]  02/01/2018  1  02/01/2018  Morphine Sulfate Ir 15 Mg Tab  40 13 [name]  [id number]  [place] ([id number])  0 46.15 MME Comm Ins  [place]  01/12/2018  1  12/07/2017  Hydrocodone-Acetamin 10-325 Mg  105 27 [name]  [id number]  [place] ([id number])  0 38.89 MME Comm Ins  [place]  12/14/2017  1  11/11/2017  Lyrica 150 Mg Capsule  90 30 [name]  [id number]  [place] ([id number])  1 2.88 LME Comm Ins  [place]  12/14/2017  1  12/07/2017  Hydrocodone-Acetamin 10-325 Mg  105 27 [name]  [id number]  [place] ([id number])  0 38.89 MME Comm Ins  [place]  11/12/2017  1  11/11/2017  Lyrica 150 Mg Capsule  90 30 [name]  [id number]  [place] ([id number])  0 2.88 LME Comm Ins  [place]  11/12/2017  1  11/11/2017  Carisoprodol 350 Mg Tablet  60 30 [name]  [id number]  [place] ([id number])  0 0.56 LME Comm Ins  [place]  11/12/2017  1  11/11/2017  Hydrocodone-Acetamin 10-325 Mg  115 29 [name]  [id number]  [place] ([id number])  0 39.66 MME Comm Ins  [employer]  10/16/2017  1  10/16/2017  Hydrocodone-Acetamin 10-325 Mg  120 30 [name]  [id number]  [place] ([id number])  0 40.00 MME Comm Ins  [place]  10/16/2017  1  08/19/2017  Lyrica 150 Mg Capsule  90 30 [name]  [id number]  [place] ([id number])  1 2.88 LME Comm Ins    09/29/2017  1  08/19/2017  Carisoprodol 350 Mg Tablet

## 2018-04-22 ENCOUNTER — Encounter: Payer: Self-pay | Admitting: Family Medicine

## 2018-04-22 ENCOUNTER — Encounter
Admission: RE | Disposition: A | Discharge status: Home / Self Care | Payer: Self-pay | Source: Ambulatory Visit | Attending: Gastroenterology

## 2018-04-22 ENCOUNTER — Ambulatory Visit: Payer: Medicare Other | Admitting: Certified Registered Nurse Anesthetist

## 2018-04-22 ENCOUNTER — Encounter: Payer: Self-pay | Admitting: *Deleted

## 2018-04-22 ENCOUNTER — Ambulatory Visit
Admission: RE | Admit: 2018-04-22 | Discharge: 2018-04-22 | Disposition: A | Discharge status: Home / Self Care | Payer: Medicare Other | Source: Ambulatory Visit | Attending: Gastroenterology | Admitting: Gastroenterology

## 2018-04-22 DIAGNOSIS — M19071 Primary osteoarthritis, right ankle and foot: Secondary | ICD-10-CM | POA: Diagnosis not present

## 2018-04-22 DIAGNOSIS — Z888 Allergy status to other drugs, medicaments and biological substances status: Secondary | ICD-10-CM | POA: Insufficient documentation

## 2018-04-22 DIAGNOSIS — K2950 Unspecified chronic gastritis without bleeding: Secondary | ICD-10-CM | POA: Diagnosis not present

## 2018-04-22 DIAGNOSIS — E785 Hyperlipidemia, unspecified: Secondary | ICD-10-CM | POA: Insufficient documentation

## 2018-04-22 DIAGNOSIS — K449 Diaphragmatic hernia without obstruction or gangrene: Secondary | ICD-10-CM | POA: Insufficient documentation

## 2018-04-22 DIAGNOSIS — K259 Gastric ulcer, unspecified as acute or chronic, without hemorrhage or perforation: Secondary | ICD-10-CM | POA: Insufficient documentation

## 2018-04-22 DIAGNOSIS — Z980 Intestinal bypass and anastomosis status: Secondary | ICD-10-CM | POA: Insufficient documentation

## 2018-04-22 DIAGNOSIS — Z886 Allergy status to analgesic agent status: Secondary | ICD-10-CM | POA: Insufficient documentation

## 2018-04-22 DIAGNOSIS — K2210 Ulcer of esophagus without bleeding: Secondary | ICD-10-CM | POA: Insufficient documentation

## 2018-04-22 DIAGNOSIS — F410 Panic disorder [episodic paroxysmal anxiety] without agoraphobia: Secondary | ICD-10-CM | POA: Insufficient documentation

## 2018-04-22 DIAGNOSIS — Z9119 Patient's noncompliance with other medical treatment and regimen: Secondary | ICD-10-CM | POA: Insufficient documentation

## 2018-04-22 DIAGNOSIS — M19072 Primary osteoarthritis, left ankle and foot: Secondary | ICD-10-CM | POA: Diagnosis not present

## 2018-04-22 DIAGNOSIS — F419 Anxiety disorder, unspecified: Secondary | ICD-10-CM | POA: Insufficient documentation

## 2018-04-22 DIAGNOSIS — Z8719 Personal history of other diseases of the digestive system: Secondary | ICD-10-CM | POA: Insufficient documentation

## 2018-04-22 DIAGNOSIS — I2510 Atherosclerotic heart disease of native coronary artery without angina pectoris: Secondary | ICD-10-CM | POA: Insufficient documentation

## 2018-04-22 DIAGNOSIS — K2960 Other gastritis without bleeding: Secondary | ICD-10-CM | POA: Diagnosis not present

## 2018-04-22 DIAGNOSIS — K208 Other esophagitis: Secondary | ICD-10-CM | POA: Diagnosis not present

## 2018-04-22 DIAGNOSIS — M19041 Primary osteoarthritis, right hand: Secondary | ICD-10-CM | POA: Insufficient documentation

## 2018-04-22 DIAGNOSIS — K5730 Diverticulosis of large intestine without perforation or abscess without bleeding: Secondary | ICD-10-CM | POA: Diagnosis not present

## 2018-04-22 DIAGNOSIS — K635 Polyp of colon: Secondary | ICD-10-CM | POA: Diagnosis not present

## 2018-04-22 DIAGNOSIS — Z85118 Personal history of other malignant neoplasm of bronchus and lung: Secondary | ICD-10-CM | POA: Diagnosis not present

## 2018-04-22 DIAGNOSIS — M19042 Primary osteoarthritis, left hand: Secondary | ICD-10-CM | POA: Diagnosis not present

## 2018-04-22 DIAGNOSIS — G4730 Sleep apnea, unspecified: Secondary | ICD-10-CM | POA: Insufficient documentation

## 2018-04-22 DIAGNOSIS — B1920 Unspecified viral hepatitis C without hepatic coma: Secondary | ICD-10-CM | POA: Insufficient documentation

## 2018-04-22 DIAGNOSIS — J432 Centrilobular emphysema: Secondary | ICD-10-CM | POA: Diagnosis not present

## 2018-04-22 DIAGNOSIS — I739 Peripheral vascular disease, unspecified: Secondary | ICD-10-CM | POA: Insufficient documentation

## 2018-04-22 DIAGNOSIS — R1032 Left lower quadrant pain: Secondary | ICD-10-CM | POA: Diagnosis present

## 2018-04-22 DIAGNOSIS — D125 Benign neoplasm of sigmoid colon: Secondary | ICD-10-CM | POA: Diagnosis not present

## 2018-04-22 DIAGNOSIS — Z79899 Other long term (current) drug therapy: Secondary | ICD-10-CM | POA: Insufficient documentation

## 2018-04-22 DIAGNOSIS — K219 Gastro-esophageal reflux disease without esophagitis: Secondary | ICD-10-CM | POA: Diagnosis not present

## 2018-04-22 DIAGNOSIS — Z7902 Long term (current) use of antithrombotics/antiplatelets: Secondary | ICD-10-CM | POA: Insufficient documentation

## 2018-04-22 DIAGNOSIS — K3189 Other diseases of stomach and duodenum: Secondary | ICD-10-CM | POA: Insufficient documentation

## 2018-04-22 DIAGNOSIS — Z9981 Dependence on supplemental oxygen: Secondary | ICD-10-CM | POA: Diagnosis not present

## 2018-04-22 DIAGNOSIS — J449 Chronic obstructive pulmonary disease, unspecified: Secondary | ICD-10-CM | POA: Diagnosis not present

## 2018-04-22 DIAGNOSIS — I10 Essential (primary) hypertension: Secondary | ICD-10-CM | POA: Insufficient documentation

## 2018-04-22 DIAGNOSIS — F329 Major depressive disorder, single episode, unspecified: Secondary | ICD-10-CM | POA: Insufficient documentation

## 2018-04-22 DIAGNOSIS — K5790 Diverticulosis of intestine, part unspecified, without perforation or abscess without bleeding: Secondary | ICD-10-CM | POA: Diagnosis not present

## 2018-04-22 DIAGNOSIS — Z885 Allergy status to narcotic agent status: Secondary | ICD-10-CM | POA: Insufficient documentation

## 2018-04-22 HISTORY — DX: Other specified abnormal immunological findings in serum: R76.8

## 2018-04-22 HISTORY — PX: COLONOSCOPY WITH PROPOFOL: SHX5780

## 2018-04-22 HISTORY — DX: Other specified abnormal immunological findings in serum: R76.89

## 2018-04-22 HISTORY — PX: ESOPHAGOGASTRODUODENOSCOPY (EGD) WITH PROPOFOL: SHX5813

## 2018-04-22 SURGERY — ESOPHAGOGASTRODUODENOSCOPY (EGD) WITH PROPOFOL
Anesthesia: General

## 2018-04-22 MED ORDER — PROPOFOL 500 MG/50ML IV EMUL
INTRAVENOUS | Status: DC | PRN
  Administered 2018-04-22: 140 ug/kg/min via INTRAVENOUS

## 2018-04-22 MED ORDER — PROPOFOL 10 MG/ML IV BOLUS
INTRAVENOUS | Status: DC | PRN
Start: 2018-04-22 — End: 2018-04-22
  Administered 2018-04-22: 19 mg via INTRAVENOUS
  Administered 2018-04-22: 15 mg via INTRAVENOUS
  Administered 2018-04-22: 18 mg via INTRAVENOUS
  Administered 2018-04-22: 15 mg via INTRAVENOUS
  Administered 2018-04-22: 90 mg via INTRAVENOUS

## 2018-04-22 MED ORDER — PHENYLEPHRINE HCL 10 MG/ML IJ SOLN
INTRAMUSCULAR | Status: DC | PRN
  Administered 2018-04-22 (×5): 100 ug via INTRAVENOUS

## 2018-04-22 MED ORDER — PHENYLEPHRINE HCL 10 MG/ML IJ SOLN
INTRAMUSCULAR | Status: AC
  Filled 2018-04-22: qty 1

## 2018-04-22 MED ORDER — LIDOCAINE HCL (PF) 2 % IJ SOLN
INTRAMUSCULAR | Status: AC
  Filled 2018-04-22: qty 10

## 2018-04-22 MED ORDER — PROPOFOL 500 MG/50ML IV EMUL
INTRAVENOUS | Status: AC
  Filled 2018-04-22: qty 50

## 2018-04-22 MED ORDER — LIDOCAINE HCL (CARDIAC) PF 100 MG/5ML IV SOSY
PREFILLED_SYRINGE | INTRAVENOUS | Status: DC | PRN
  Administered 2018-04-22: 50 mg via INTRAVENOUS

## 2018-04-22 MED ORDER — SODIUM CHLORIDE 0.9 % IV SOLN
INTRAVENOUS | Status: DC
  Administered 2018-04-22: 09:00:00 via INTRAVENOUS

## 2018-04-22 NOTE — Transfer of Care (Signed)
Immediate Anesthesia Transfer of Care Note  Patient: [name]  Procedure(s) Performed: ESOPHAGOGASTRODUODENOSCOPY (EGD) WITH PROPOFOL (N/A ) COLONOSCOPY WITH PROPOFOL (N/A )  Patient Location: Endoscopy Unit  Anesthesia Type:General  Level of Consciousness: drowsy  Airway & Oxygen Therapy: Patient Spontanous Breathing and Patient connected to nasal cannula oxygen  Post-op Assessment: Report given to RN and Post -op Vital signs reviewed and stable  Post vital signs: Reviewed and stable  Last Vitals:  Vitals Value Taken Time  BP 102/54 04/22/2018 10:16 AM  Temp 36.1 C 04/22/2018 10:16 AM  Pulse 68 04/22/2018 10:17 AM  Resp 24 04/22/2018 10:17 AM  SpO2 95 % 04/22/2018 10:17 AM  Vitals shown include unvalidated device data.  Last Pain:  Vitals:   04/22/18 1016  TempSrc: Tympanic         Complications: No apparent anesthesia complications

## 2018-04-22 NOTE — Anesthesia Post-op Follow-up Note (Signed)
Anesthesia QCDR form completed.        

## 2018-04-22 NOTE — Assessment & Plan Note (Addendum)
Patient currently in the process of establishing with a new Provider, out of pain medication and reports inadequate control on twice daily hydrocodone when once she was on 4 times daily. Reports adverse reaction to meloxicam and hydroxyzine , h/o gIO bleed and has GI tests in am. Will bridge her short term with 3 times daily hydrocodone and once daily/ bedrtme soma . I have reviewed her narc registry. I am actively seeking new Pain management and she understands this Am interim pain contract has been signed. I will notify Dr Merlene Laughter

## 2018-04-22 NOTE — Assessment & Plan Note (Signed)
Message sent to telepsych requesting that they contact pt on her home number

## 2018-04-22 NOTE — Assessment & Plan Note (Signed)
Asked: confirm current nicotine use Assess: not willing to set quit date but wants to quit Advise: needs to quit, personal h/o lung cancer Arrange: 1800 QUIT NOW# and general advice re smoking cessation benefits Arrange f/u in 11 weeks

## 2018-04-22 NOTE — OR Nursing (Signed)
Verified with patient friend/ caregiver, [name], that she will be at the patient's home when she arrives post procedure today.  It is expected that patient will be discharged and arrive home approximately at 11:30am following an EGD and Colonoscopy by Dr Gustavo Lah.  [name] is aware that she will need to stay with patient once she arrives home.  Phone numbers to reach [name]: Cell:      [phone] Home:   [phone]   Patient's Home Number:  [phone]

## 2018-04-22 NOTE — Progress Notes (Signed)
Started EGD and patient coughing, MD withdrew scope and will proceed with colonoscopy and then go back to EGD
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
## 2018-04-22 NOTE — H&P (Signed)
Outpatient short stay form Pre-procedure 04/22/2018 8:36 AM Lollie Sails MD  Primary Physician: [name], MD  Reason for visit: EGD and colonoscopy  History of present illness: Patient is a 67 year old female presenting today as above.  She had an EGD on 01/19/2018 that showed a gastric ulcer.  She is returning for a repeat check on that as well as colonoscopy for screening purposes.  She also has a history of diverticulosis and left lower quadrant discomfort.  She tolerated her prep well.  She takes no aspirin products.  She does take Plavix however has been off that medication for 7 days.   No current facility-administered medications for this encounter.   Medications Prior to Admission  Medication Sig Dispense Refill Last Dose  . pantoprazole (PROTONIX) 40 MG tablet Take 40 mg by mouth daily.     Marland Kitchen PARoxetine (PAXIL) 10 MG tablet Take 10 mg by mouth daily.     . pregabalin (LYRICA) 100 MG capsule Take 150 mg by mouth 3 (three) times daily.     . carisoprodol (SOMA) 350 MG tablet Take 350 mg by mouth 2 (two) times daily as needed for muscle spasms.   Taking  . carisoprodol (SOMA) 350 MG tablet One at bedtime for muscle spasm 30 tablet 1   . cetirizine (ZYRTEC) 10 MG tablet Take 1 tablet (10 mg total) daily by mouth. 90 tablet 3 Taking  . clopidogrel (PLAVIX) 75 MG tablet Take 37.5 mg by mouth daily.   04/15/2018  . EPINEPHrine 0.3 mg/0.3 mL IJ SOAJ injection    Taking  . flecainide (TAMBOCOR) 150 MG tablet Take 75 mg by mouth 2 (two) times daily.    Taking  . HYDROcodone-acetaminophen (NORCO) 10-325 MG tablet Take 1 tablet every 6 (six) hours as needed by mouth.    Taking  . HYDROcodone-acetaminophen (NORCO) 7.5-325 MG tablet One tablet three times daily for pain 90 tablet 0   . [START ON 05/21/2018] HYDROcodone-acetaminophen (NORCO) 7.5-325 MG tablet One tablet three times daily for pain 90 tablet 0   . Iodine, Kelp, (KELP PO) Take 1 tablet by mouth daily.   Taking  .  metoprolol succinate (TOPROL-XL) 25 MG 24 hr tablet Take 12.5 mg by mouth daily.   04/22/2018 at 0600  . Misc Natural Products (TART CHERRY ADVANCED) CAPS Take 1 capsule by mouth daily.    Taking  . omega-3 acid ethyl esters (LOVAZA) 1 g capsule Take by mouth.   Taking  . pravastatin (PRAVACHOL) 20 MG tablet Take 20 mg by mouth daily.   04/22/2018 at 0600  . rosuvastatin (CRESTOR) 10 MG tablet Take 1 tablet (10 mg total) by mouth daily. 90 tablet 1   . Tiotropium Bromide Monohydrate (SPIRIVA RESPIMAT) 2.5 MCG/ACT AERS INHALE 2 PUFFS EVERY DAY 12 g 3   . traZODone (DESYREL) 50 MG tablet Take 1 tablet (50 mg total) by mouth at bedtime. 90 tablet 1      Allergies  Allergen Reactions  . Aspirin Hives and Itching  . Diphenhydramine Hcl Other (See Comments), Swelling and Hives  . Hydroxyzine     Reports excess sweating , loss of appetite, weight loss, abnormal movement of her eyes, hearing loss  . Meloxicam      GI bleed  . Morphine Nausea And Vomiting and Other (See Comments)    Stomach cramping, diarrhea Stomach cramping, diarrhea   . Salicin Swelling and Hives  . Salix Species Hives, Itching and Swelling    Requires EPI PEN. Swelling of  throat, tongue.   . Tramadol Hcl Other (See Comments)    Lowers BP  . Willow Bark [White Willow Bark] Hives, Itching and Swelling    Requires EPI PEN. Swelling of throat, tongue.   Leta Speller Leaf Swallow Wort Rhizome Hives, Itching and Swelling    Requires EPI PEN, Welling of throat, tongue.  . Benadryl [Diphenhydramine Hcl] Itching    Makes patient fell hyper.  . Codeine     Patient also states she is allergic to steroids.  Marland Kitchen Cymbalta [Duloxetine Hcl] Other (See Comments)    Agitation, poor sleep  . Other Other (See Comments)    All steroids - makes blood pressure drop and she feels like she is bottoming out  . Oxycodone Other (See Comments)    Makes bp bottom out   . Prednisone     All steroids  . Promethazine Other (See Comments)  . Tramadol  Other (See Comments)  . Fluorometholone Swelling    Eye drops caused lid swelling and itching  . Promethazine Hcl Anxiety    Pt. States hallucinations and anxiety  . Wellbutrin [Bupropion] Nausea Only     Past Medical History:  Diagnosis Date  . Acute GI bleeding 01/28/2012  . Anemia due to blood loss, acute 01/28/2012  . Aortic mural thrombus ([place]) 01/28/2012   Per CT of the abdomen  . Arthritis    "qwhere; hands, feet, overall stiffness" (10/20/2013)  . Cancer ([place])    lung cancer  . Chronic bronchitis ([place])   . Chronic lower back pain   . Complication of anesthesia    "lungs quit working during [place] in [place]" (10/20/2013); pt. states that she can't breathe after surgery when laying on back  . COPD (chronic obstructive pulmonary disease) ([place])   . Coronary artery disease   . Daily headache    Patient stated they are felt in back of the head, not throbing. But always in same spot. MRI's done, no reason why they occur. (10/20/2013)  . Depression   . Diastolic dysfunction 3/81/8299   Grade 1  . Diverticulitis    pt reports 8 times. Dr. Geroge Baseman colectomy in 2009  . Diverticulosis 2008   diagnosed; pt. states now cured 07/19/15  . Dysrhythmia    pt. states not since ablation..history of Supraventricular tachycardia  . Fibromyalgia   . Fracture 2006   left foot & ankle , immobilized for healing   . Gout    Recently diagnosed.  Marland Kitchen HCV antibody positive   . HEARING LOSS    since age 29  . Hepatitis C 1993    Needs Hepatic panel every 6   months, treated for 1 year   . Hiatal hernia    "repaired"   . History of blood transfusion    "probably when I was young, when I was 15" (10/20/2013)  . History of pneumonia   . Hyperlipidemia 2001  . Hypertension 2001  . Menopause    per medical history form  . Night sweats    Per medical history form dated 05/02/11.  . On home oxygen therapy    "2L only at night" (10/20/2013); pt. currently not wearing O2 at night (07/19/15)  . Panic  disorder    was followed by mental health  . Sleep apnea 2001   non compliant wit the use of the machine  . Sleep apnea    wear oxygen at bedtime.   . SOB (shortness of breath)    "after lying in bed, go  to the bathroom; heart races & I'm SOB" (10/20/2013)  . SVT (supraventricular tachycardia) ([place])    s/p ablation 10-20-2013 by Dr Lovena Le  . Tinnitus 2006   disabling  . Wears dentures    Per medical history form dated 05/02/11.  . Wears glasses     Review of systems:      Physical Exam    Heart and lungs: Regular rate and rhythm without rub or gallop, lungs show occasional coarse wheeze otherwise clear.    HEENT: Normocephalic atraumatic eyes are anicteric    Other:    Pertinant exam for procedure: Soft nontender nondistended bowel sounds positive normoactive.    Planned proceedures: EGD, colonoscopy and indicated procedures. I have discussed the risks benefits and complications of procedures to include not limited to bleeding, infection, perforation and the risk of sedation and the patient wishes to proceed.  It has been discussed with the patient need to have an alert adult at home when she gets home.  She states that there is an individual named Juliann Pulse who will be able to take care of her when she gets home.  Stands as needed and nurses with multiple staff members.    Lollie Sails, MD Gastroenterology 04/22/2018  8:36 AM

## 2018-04-22 NOTE — Op Note (Addendum)
[facility] Gastroenterology Patient Name: [name] Procedure Date: 04/22/2018 7:59 AM MRN: [id number] Account #: [id number] Date of Birth: 12-22-1950 Admit Type: Outpatient Age: 67 Room: [facility] ENDO ROOM 1 Gender: Female Note Status: Finalized Procedure:            Upper GI endoscopy Indications:          Follow-up of gastric ulcer Providers:            Lollie Sails, MD Referring MD:         Norwood Levo. Moshe Cipro MD, MD (Referring MD) Medicines:            Monitored Anesthesia Care Complications:        No immediate complications. Procedure:            Pre-Anesthesia Assessment:                       - ASA Grade Assessment: IV - A patient with severe                        systemic disease that is a constant threat to life.                       After obtaining informed consent, the endoscope was                        passed under direct vision. Throughout the procedure,                        the patient's blood pressure, pulse, and oxygen                        saturations were monitored continuously. The Endoscope                        was introduced through the mouth, and advanced to the                        third part of duodenum. The upper GI endoscopy was                        accomplished without difficulty. The patient tolerated                        the procedure well. Findings:      LA Grade B (one or more mucosal breaks greater than 5 mm, not extending       between the tops of two mucosal folds) esophagitis with no bleeding was       found. Biopsies were taken with a cold forceps for histology.      The exam of the esophagus was otherwise normal.      Patchy moderate inflammation characterized by erosions, erythema and       shallow ulcerations was found at the incisura and in the gastric antrum.       The previously noted ulcer is improved but not resolved. Biopsies were       taken with a cold forceps for histology. Biopsies were  taken with a cold       forceps for Helicobacter pylori testing.      Evidence of a prior Nissen fundoplication was found  in the cardia. This       was characterized by healthy appearing mucosa.      A small hiatal hernia was present.      The examined duodenum was normal. Impression:           - LA Grade B erosive esophagitis. Biopsied.                       - Erosive gastritis. Biopsied.                       - A Nissen fundoplication was found, characterized by                        healthy appearing mucosa.                       - Small hiatal hernia. Recommendation:       - Await pathology results.                       - Use Protonix (pantoprazole) 40 mg PO BID for 4 weeks.                       - Use Protonix (pantoprazole) 40 mg PO daily daily.                       - Return to GI clinic in 5 weeks. Procedure Code(s):    --- Professional ---                       [phone], Esophagogastroduodenoscopy, flexible, transoral;                        with biopsy, single or multiple Diagnosis Code(s):    --- Professional ---                       K20.8, Other esophagitis                       K29.60, Other gastritis without bleeding                       Z98.890, Other specified postprocedural states                       K44.9, Diaphragmatic hernia without obstruction or                        gangrene                       K25.9, Gastric ulcer, unspecified as acute or chronic,                        without hemorrhage or perforation CPT copyright 2017 American Medical Association. All rights reserved. The codes documented in this report are preliminary and upon coder review may  be revised to meet current compliance requirements. Lollie Sails, MD 04/22/2018 10:20:16 AM This report has been signed electronically. Number of Addenda: 0 Note Initiated On: 04/22/2018 7:59 AM      [facility]

## 2018-04-22 NOTE — Assessment & Plan Note (Signed)
Pt reports recurrent symptoms in the past 3 weeks up to the present. EKG in the office shows NSR, rate of 98, clinical exam is consistent with patient who is clinically stable and has no cardiopulmonary compromise

## 2018-04-25 NOTE — Anesthesia Postprocedure Evaluation (Signed)
Anesthesia Post Note  Patient: [name]  Procedure(s) Performed: ESOPHAGOGASTRODUODENOSCOPY (EGD) WITH PROPOFOL (N/A ) COLONOSCOPY WITH PROPOFOL (N/A )  Patient location during evaluation: PACU Anesthesia Type: General Level of consciousness: awake and alert Pain management: pain level controlled Vital Signs Assessment: post-procedure vital signs reviewed and stable Respiratory status: spontaneous breathing, nonlabored ventilation, respiratory function stable and patient connected to nasal cannula oxygen Cardiovascular status: blood pressure returned to baseline and stable Postop Assessment: no apparent nausea or vomiting Anesthetic complications: no     Last Vitals:  Vitals:   04/22/18 1036 04/22/18 1046  BP: 136/74 133/71  Pulse:    Resp:    Temp:    SpO2:      Last Pain:  Vitals:   04/22/18 1046  TempSrc:   PainSc: 0-No pain                 Molli Barrows

## 2018-04-26 ENCOUNTER — Telehealth: Payer: Self-pay | Admitting: Family Medicine

## 2018-04-26 LAB — SURGICAL PATHOLOGY

## 2018-04-26 NOTE — Telephone Encounter (Signed)
I caalled and left patient a message to call me back today during regular office hours please get me to the phone if she calls

## 2018-04-27 ENCOUNTER — Encounter: Payer: Self-pay | Admitting: Gastroenterology

## 2018-04-27 NOTE — Op Note (Signed)
[facility] Gastroenterology Patient Name: Glendola Friedhoff Procedure Date: 04/22/2018 7:58 AM MRN: [id number] Account #: [id number] Date of Birth: [birth year] Admit Type: Outpatient Age: 67 Room: [facility] ENDO ROOM 1 Gender: Female Note Status: Finalized Procedure:            Colonoscopy Indications:          Abdominal pain in the left lower quadrant Providers:            Lollie Sails, MD Medicines:            Monitored Anesthesia Care Complications:        No immediate complications. Procedure:            Pre-Anesthesia Assessment:                       - ASA Grade Assessment: IV - A patient with severe                        systemic disease that is a constant threat to life.                       After obtaining informed consent, the colonoscope was                        passed under direct vision. Throughout the procedure,                        the patient's blood pressure, pulse, and oxygen                        saturations were monitored continuously. The                        Colonoscope was introduced through the anus and                        advanced to the the cecum, identified by appendiceal                        orifice and ileocecal valve. The colonoscopy was                        performed without difficulty. The patient tolerated the                        procedure well. The quality of the bowel preparation                        was good. Findings:      A 3 mm polyp was found in the distal sigmoid colon. The polyp was       sessile. The polyp was removed with a cold biopsy forceps. Resection and       retrieval were complete.      Many small and large-mouthed diverticula were found in the sigmoid colon       and descending colon.      There was evidence of a prior end-to-end colo-colonic anastomosis at 14       cm proximal to the anus. This was patent and was characterized by       healthy appearing mucosa.  The digital rectal exam  was normal.      The retroflexed view of the distal rectum and anal verge was normal and       showed no anal or rectal abnormalities. Impression:           - One 3 mm polyp in the distal sigmoid colon, removed                        with a cold biopsy forceps. Resected and retrieved.                       - Diverticulosis in the sigmoid colon and in the                        descending colon.                       - Patent end-to-end colo-colonic anastomosis,                        characterized by healthy appearing mucosa.                       - The distal rectum and anal verge are normal on                        retroflexion view. Recommendation:       - Return to GI clinic in 1 month. Procedure Code(s):    --- Professional ---                       [phone], Colonoscopy, flexible; with biopsy, single or                        multiple Diagnosis Code(s):    --- Professional ---                       D12.5, Benign neoplasm of sigmoid colon                       Z98.0, Intestinal bypass and anastomosis status                       R10.32, Left lower quadrant pain                       K57.30, Diverticulosis of large intestine without                        perforation or abscess without bleeding CPT copyright 2017 American Medical Association. All rights reserved. The codes documented in this report are preliminary and upon coder review may  be revised to meet current compliance requirements. Lollie Sails, MD 04/27/2018 11:22:33 AM Number of Addenda: 0 Note Initiated On: 04/22/2018 7:58 AM Scope Withdrawal Time: 0 hours 11 minutes 10 seconds  Total Procedure Duration: 0 hours 18 minutes 6 seconds       [facility]

## 2018-04-29 ENCOUNTER — Telehealth: Payer: Self-pay | Admitting: Family Medicine

## 2018-04-29 NOTE — Telephone Encounter (Signed)
Dr.Whitaker's office ([facility]- patients referral request) called to make sure you knew that he will not make the decision to prescribe patient pain medication until after her consult with him in July.  Cb#: [phone] ext. [phone] (Megan) -if you need to further discuss.

## 2018-04-29 NOTE — Telephone Encounter (Signed)
Direct contact made with the patient making her aware that I am canceling the second prescription she was provided for chronic pain management as she has an appointment with a pain clinic of her choice in [place] early in July. She understands and is in agreemement. I will also have my front desk staff reschedule her 2 month follow up here as she will not need to return so soon

## 2018-05-04 ENCOUNTER — Other Ambulatory Visit: Payer: Self-pay | Admitting: Family Medicine

## 2018-05-04 ENCOUNTER — Telehealth: Payer: Self-pay | Admitting: Family Medicine

## 2018-05-04 MED ORDER — CARISOPRODOL 350 MG PO TABS: ORAL_TABLET | ORAL | 3 refills | Status: DC

## 2018-05-04 NOTE — Telephone Encounter (Signed)
Telephone contact made with pt letting her know that I will be responsible for prescribing soma 1 at bedtime as the Pain specialist does not prescribe that and thatr the Pain Specialist who she has an appt with has said that he will determine her pain medication needs at the time of her next visit and will decide what he will do, as such the hydrocodone script which I withdrew is currently on hold, her appt is before the next fill is due

## 2018-05-04 NOTE — Progress Notes (Signed)
Soma

## 2018-05-05 ENCOUNTER — Telehealth (HOSPITAL_COMMUNITY): Payer: Self-pay

## 2018-05-05 ENCOUNTER — Encounter: Payer: Self-pay | Admitting: Allergy & Immunology

## 2018-05-05 ENCOUNTER — Telehealth: Payer: Self-pay | Admitting: Family Medicine

## 2018-05-05 DIAGNOSIS — Z961 Presence of intraocular lens: Secondary | ICD-10-CM | POA: Diagnosis not present

## 2018-05-05 DIAGNOSIS — H04123 Dry eye syndrome of bilateral lacrimal glands: Secondary | ICD-10-CM | POA: Diagnosis not present

## 2018-05-05 NOTE — Telephone Encounter (Signed)
Patient left message requesting a call back 336/ [phone]  To relay a message from [place]. No answer.

## 2018-05-05 NOTE — Telephone Encounter (Signed)
This has been addressed.

## 2018-05-05 NOTE — Telephone Encounter (Signed)
Unable to reach patient.  Consulted with Dr. Modesta Messing and patient will ben placed on the inactive list.  Routed information to PCP and Dr.Hisada.

## 2018-05-10 ENCOUNTER — Telehealth: Payer: Self-pay | Admitting: Family Medicine

## 2018-05-10 DIAGNOSIS — K219 Gastro-esophageal reflux disease without esophagitis: Secondary | ICD-10-CM | POA: Diagnosis not present

## 2018-05-10 NOTE — Telephone Encounter (Signed)
Faxed to Ver[facility]rown Fax number [phone] that is was medically necessary that [name] [facility]

## 2018-05-17 ENCOUNTER — Telehealth: Payer: Self-pay | Admitting: Family Medicine

## 2018-05-17 NOTE — Telephone Encounter (Signed)
Patient went to her scheduled appt with Pain Management today, as she arrived they told her that her appointment was canceled and that they contacted our office to make [place] aware. I have no documentation confirming this. I have spoken to patient and she said it was ok to send referral else where.

## 2018-05-17 NOTE — Telephone Encounter (Signed)
Please refer to [facility] in [place] as we discussed

## 2018-05-18 ENCOUNTER — Telehealth: Payer: Self-pay | Admitting: Family Medicine

## 2018-05-18 NOTE — Telephone Encounter (Signed)
DONE

## 2018-05-18 NOTE — Telephone Encounter (Signed)
Patient is out of pain medication Friday.  I faxed her referral to [facility] this morning. Please advise.

## 2018-05-20 ENCOUNTER — Other Ambulatory Visit: Payer: Self-pay | Admitting: Family Medicine

## 2018-05-20 MED ORDER — HYDROCODONE-ACETAMINOPHEN 7.5-325 MG PO TABS: ORAL_TABLET | ORAL | 0 refills | Status: AC

## 2018-05-20 NOTE — Progress Notes (Signed)
vicodin 

## 2018-05-20 NOTE — Telephone Encounter (Signed)
Referral is in review, 1 month of medication sent in to last un til 06/21/2018

## 2018-05-20 NOTE — Telephone Encounter (Signed)
Medication is sent in and she is aware

## 2018-05-27 DIAGNOSIS — M2550 Pain in unspecified joint: Secondary | ICD-10-CM | POA: Diagnosis not present

## 2018-05-27 DIAGNOSIS — J439 Emphysema, unspecified: Secondary | ICD-10-CM | POA: Diagnosis not present

## 2018-05-27 DIAGNOSIS — M7989 Other specified soft tissue disorders: Secondary | ICD-10-CM | POA: Diagnosis not present

## 2018-05-27 DIAGNOSIS — Z6824 Body mass index (BMI) 24.0-24.9, adult: Secondary | ICD-10-CM | POA: Diagnosis not present

## 2018-05-27 DIAGNOSIS — G894 Chronic pain syndrome: Secondary | ICD-10-CM | POA: Diagnosis not present

## 2018-05-31 DIAGNOSIS — M2550 Pain in unspecified joint: Secondary | ICD-10-CM | POA: Diagnosis not present

## 2018-06-09 DIAGNOSIS — Z1231 Encounter for screening mammogram for malignant neoplasm of breast: Secondary | ICD-10-CM | POA: Diagnosis not present

## 2018-06-11 DIAGNOSIS — K921 Melena: Secondary | ICD-10-CM | POA: Diagnosis not present

## 2018-06-14 ENCOUNTER — Telehealth: Payer: Self-pay | Admitting: Family Medicine

## 2018-06-14 NOTE — Telephone Encounter (Signed)
PT LVM that she needed the Nurse to call her back

## 2018-06-16 ENCOUNTER — Other Ambulatory Visit: Payer: Self-pay | Admitting: Family Medicine

## 2018-06-16 DIAGNOSIS — K921 Melena: Secondary | ICD-10-CM | POA: Diagnosis not present

## 2018-06-16 DIAGNOSIS — K2950 Unspecified chronic gastritis without bleeding: Secondary | ICD-10-CM | POA: Diagnosis not present

## 2018-06-16 DIAGNOSIS — R195 Other fecal abnormalities: Secondary | ICD-10-CM | POA: Diagnosis not present

## 2018-06-16 DIAGNOSIS — K210 Gastro-esophageal reflux disease with esophagitis: Secondary | ICD-10-CM | POA: Diagnosis not present

## 2018-06-16 MED ORDER — HYDROCODONE-ACETAMINOPHEN 7.5-325 MG PO TABS: ORAL_TABLET | ORAL | 0 refills | Status: AC

## 2018-06-16 NOTE — Telephone Encounter (Signed)
Med sent.

## 2018-06-16 NOTE — Progress Notes (Signed)
norco 7.5

## 2018-06-16 NOTE — Telephone Encounter (Signed)
Pt needs a refill on hydrocodone 7.5-325 sent to [facility].Marland Kitchen  Appt is after she runs out.  WE can leave a msg on [phone].Marland Kitchen  Also hasnt had a bowel movement since Sat, Blood in Stool, has an appt with a GI doctor 06-16-18 at 11.

## 2018-06-30 ENCOUNTER — Telehealth: Payer: Self-pay

## 2018-06-30 ENCOUNTER — Ambulatory Visit (INDEPENDENT_AMBULATORY_CARE_PROVIDER_SITE_OTHER): Payer: Medicare Other | Admitting: Family Medicine

## 2018-06-30 VITALS — BP 130/70 | HR 88 | Resp 14 | Ht 63.0 in | Wt 137.0 lb

## 2018-06-30 DIAGNOSIS — F418 Other specified anxiety disorders: Secondary | ICD-10-CM

## 2018-06-30 DIAGNOSIS — F17200 Nicotine dependence, unspecified, uncomplicated: Secondary | ICD-10-CM

## 2018-06-30 DIAGNOSIS — Z23 Encounter for immunization: Secondary | ICD-10-CM

## 2018-06-30 DIAGNOSIS — R52 Pain, unspecified: Secondary | ICD-10-CM

## 2018-06-30 DIAGNOSIS — F17210 Nicotine dependence, cigarettes, uncomplicated: Secondary | ICD-10-CM | POA: Diagnosis not present

## 2018-06-30 NOTE — Patient Instructions (Addendum)
F/U first week in December, call if you need me before   Flu vaccine today  Happy birthday in September, and Many , many more   You will have your initial telephone consult with the therapist today in the office before you leave  Thankful that you do not have rheumatoid arthritis    I am directly reaching out to the provider who prescribed the insert for your right foot to see how / if the cost of the insert can be carried in part by your insurance, sign for note from Dr today please so I can be in touch with him   We will be working on getting you an appointment with the pain clinic and will be in touch , in the interim , I will continue  to be responsible for your pain management   Currently smoking 1 PPD of cigarettes, I encourage you to work opn cutting back, using the patch and wellbutrin together is a good option for quitting once you decide   Steps to Quit Smoking Smoking tobacco can be bad for your health. It can also affect almost every organ in your body. Smoking puts you and people around you at risk for many serious long-lasting (chronic) diseases. Quitting smoking is hard, but it is one of the best things that you can do for your health. It is never too late to quit. What are the benefits of quitting smoking? When you quit smoking, you lower your risk for getting serious diseases and conditions. They can include:  Lung cancer or lung disease.  Heart disease.  Stroke.  Heart attack.  Not being able to have children (infertility).  Weak bones (osteoporosis) and broken bones (fractures).  If you have coughing, wheezing, and shortness of breath, those symptoms may get better when you quit. You may also get sick less often. If you are pregnant, quitting smoking can help to lower your chances of having a baby of low birth weight. What can I do to help me quit smoking? Talk with your doctor about what can help you quit smoking. Some things you can do (strategies)  include:  Quitting smoking totally, instead of slowly cutting back how much you smoke over a period of time.  Going to in-person counseling. You are more likely to quit if you go to many counseling sessions.  Using resources and support systems, such as: ? Database administrator with a Social worker. ? Phone quitlines. ? Careers information officer. ? Support groups or group counseling. ? Text messaging programs. ? Mobile phone apps or applications.  Taking medicines. Some of these medicines may have nicotine in them. If you are pregnant or breastfeeding, do not take any medicines to quit smoking unless your doctor says it is okay. Talk with your doctor about counseling or other things that can help you.  Talk with your doctor about using more than one strategy at the same time, such as taking medicines while you are also going to in-person counseling. This can help make quitting easier. What things can I do to make it easier to quit? Quitting smoking might feel very hard at first, but there is a lot that you can do to make it easier. Take these steps:  Talk to your family and friends. Ask them to support and encourage you.  Call phone quitlines, reach out to support groups, or work with a Social worker.  Ask people who smoke to not smoke around you.  Avoid places that make you want (trigger) to smoke,  such as: ? Bars. ? Parties. ? Smoke-break areas at work.  Spend time with people who do not smoke.  Lower the stress in your life. Stress can make you want to smoke. Try these things to help your stress: ? Getting regular exercise. ? Deep-breathing exercises. ? Yoga. ? Meditating. ? Doing a body scan. To do this, close your eyes, focus on one area of your body at a time from head to toe, and notice which parts of your body are tense. Try to relax the muscles in those areas.  Download or buy apps on your mobile phone or tablet that can help you stick to your quit plan. There are many free apps,  such as QuitGuide from the [employer] Office manager for Disease Control and Prevention). You can find more support from smokefree.gov and other websites.  This information is not intended to replace advice given to you by your health care provider. Make sure you discuss any questions you have with your health care provider. Document Released: 08/23/2009 Document Revised: 06/24/2016 Document Reviewed: 03/13/2015 Elsevier Interactive Patient Education  2018 [employer].

## 2018-06-30 NOTE — Telephone Encounter (Signed)
VBH - Patient reports that she has to care for her six dogs because she just got home from her doctor's appt.  Patient reports that she will call me back when she is finished.

## 2018-06-30 NOTE — Progress Notes (Signed)
Fill Date ID Written Drug Qty Days Prescriber Rx # Pharmacy Refill Daily Dose * Pymt Type PMP  06/18/2018  1  06/16/2018  Hydrocodone-Acetamin 7.5-325  90.00 30 [name]  [id number]  [place] ([id number])  1/1 22.50 MME Comm Ins  [place]  06/18/2018  1  05/04/2018  Carisoprodol 350 Mg Tablet  30.00 30 [name]  [id number]  [place] ([id number])  2/3 0.28 LME Comm Ins  [place]  05/21/2018  1  05/20/2018  Hydrocodone-Acetamin 7.5-325  90.00 30 [name]  [id number]  [place] ([id number])  1/1 22.50 MME Comm Ins  [place]  05/21/2018  1  05/04/2018  Carisoprodol 350 Mg Tablet  30.00 30 [name]  [id number]  [place] ([id number])  1/3 0.28 LME Comm Ins  [place]  04/21/2018  1  04/21/2018  Hydrocodone-Acetamin 7.5-325  90.00 30 [name]  [id number]  [place] ([id number])  1/1 22.50 MME Comm Ins  [place]  03/29/2018  1  03/15/2018  Hydrocodone-Acetamin 7.5-325  28.00 14 [name]  [id number]  [place] ([id number])  1/1 15.00 MME Comm Ins  [place]  03/15/2018  1  03/15/2018  Hydrocodone-Acetamin 10-325 Mg  28.00 14 [name]  [id number]  [place] ([id number])  1/1 20.00 MME Comm Ins  [place]  03/15/2018  1  03/15/2018  Carisoprodol 350 Mg Tablet  60.00 30 [name]  [id number]  [place] ([id number])  1/1 0.56 LME Comm Ins  [place]  02/15/2018  1  02/15/2018  Hydrocodone-Acetamin 10-325 Mg  105.00 26 [name]  [id number]  [place] ([id number])  1/1 40.38 MME Comm Ins  [place]  02/05/2018  1  02/05/2018  Oxycodon-Acetaminophen 7.5-325  90.00 30 [name]  [id number]  [place] ([id number])  1/1 33.75 MME Comm Ins  [place]  02/01/2018  1  02/01/2018  Carisoprodol 350 Mg Tablet  60.00 30 [name]  [id number]  [place] ([id number])  1/1 0.56 LME Comm Ins  [place]  02/01/2018  1  02/01/2018  Morphine Sulfate Ir 15 Mg Tab  40.00 13 [name]  [id number]  [place] ([id number])  1/1 46.15 MME Comm Ins  [place]  01/12/2018  1  12/07/2017  Hydrocodone-Acetamin 10-325 Mg  105.00 27 [name]  [id number]  [place] ([id number])  1/1 38.89 MME Comm Ins  [place]  12/14/2017  1  11/11/2017  Lyrica 150 Mg Capsule  90.00 30 [name]  [id number]  [place] ([id number])  2/1 2.88 LME Comm Ins  [place]  12/14/2017  1  12/07/2017  Hydrocodone-Acetamin 10-325 Mg  105.00 27 [name]  [id number]  [place] ([id number])  1/1 38.89 MME  Comm Ins  [place]  11/12/2017  1  11/11/2017  Lyrica 150 Mg Capsule  90.00 30 [name]  [id number]  [place] ([id number])  1/1 2.88 LME Comm Ins  [place]  11/12/2017  1  11/11/2017  Carisoprodol 350 Mg Tablet  60.00 30 [name]  [id number]  [place] ([id number])  1/1 0.56 LME Comm Ins  [place]  11/12/2017  1  11/11/2017  Hydrocodone-Acetamin 10-325 Mg  115.00 29 [name]  [id number]  [place] ([id number])  1/1 39.66 MME Comm Ins  [place]  10/16/2017  1  10/16/2017  Hydrocodone-Acetamin 10-325 Mg  120.00 30 [name]  [id number]  [place] ([id number])  1/1 40.00 MME Comm Ins    10/16/2017  1  08/19/2017  Lyrica 150 Mg Capsule  90.00 30 [name]  [id number]  [place] ([id number])  2/1 2.88 LME Comm Ins    09/29/2017  1  08/19/2017  Carisoprodol 350 Mg Tablet  60.00 30 Ko  Doo  [id number]  [place] [phone]  2/1 0.56 LME Comm Ins  [place]  09/18/2017  1  08/19/2017  Hydrocodone-Acetamin 10-325 Mg  105.00 27 [name]  [id number]  [place] ([id number])  1/1 38.89 MME Comm Ins  [place]  08/19/2017  1  08/19/2017  Carisoprodol 350 Mg Tablet

## 2018-07-01 ENCOUNTER — Other Ambulatory Visit (HOSPITAL_COMMUNITY)
Admission: RE | Admit: 2018-07-01 | Discharge: 2018-07-01 | Disposition: A | Discharge status: Home / Self Care | Payer: Medicare Other | Source: Other Acute Inpatient Hospital | Attending: Family Medicine | Admitting: Family Medicine

## 2018-07-01 DIAGNOSIS — R52 Pain, unspecified: Secondary | ICD-10-CM | POA: Insufficient documentation

## 2018-07-02 LAB — MISC LABCORP TEST (SEND OUT): Labcorp test code: 733692

## 2018-07-11 ENCOUNTER — Encounter: Payer: Self-pay | Admitting: Family Medicine

## 2018-07-11 MED ORDER — HYDROCODONE-ACETAMINOPHEN 7.5-325 MG PO TABS: ORAL_TABLET | ORAL | 0 refills | Status: DC

## 2018-07-11 NOTE — Progress Notes (Signed)
   [name]     MRN: [id number]      DOB: 06/12/1951   HPI Ms. Howser is here for follow up and re-evaluation of chronic medical conditions, medication management and review of any available recent lab and radiology data.  Preventive health is updated, specifically  Cancer screening and Immunization.   She has been evaluated by rheumatology and has no rheumatologic syndrome which is good news. She still however c/o chronic disabling pain esp in her right ankle states her current orthotic recommended by podiatry is not covered and she cannot afford it  C/o uncontrolled depression, is interested in teletherapy, is not suicidal or homicidal, transportation being an issue she is best served with teletherapy if this works for her Smoking up to 1 PPD and unwilling to set quit  date at this time  Had recent episode of BRRB , was evalauted at nearby clinic/ urgent care and then by her GI Doc  ROS Denies recent fever or chills. Denies sinus pressure, nasal congestion, ear pain or sore throat. Denies chest congestion, productive cough or wheezing. Denies chest pains, palpitations and leg swelling Denies abdominal pain, nausea, vomiting,diarrhea or constipation.   Denies dysuria, frequency, hesitancy or incontinence.  Denies headaches, seizures, numbness, or tingling. Denies skin break down or rash.   PE BP 130/70   Pulse 88   Resp 14   Ht 5\' 3"  (1.6 m)   Wt 137 lb (62.1 kg)   BMI 24.27 kg/m     Patient alert and oriented and in no cardiopulmonary distress.  HEENT: No facial asymmetry, EOMI,   oropharynx pink and moist.  Neck supple no JVD, no mass.  Chest: Clear to auscultation bilaterally.Decreased air entry  CVS: S1, S2 no murmurs, no S3.Regular rate.  ABD: Soft non tender.   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  Psych: Good  eye contact, normal affect. Memory intact anxious and mildly  depressed appearing.  CNS: CN 2-12 intact, power,  normal throughout.no focal deficits noted.   Assessment & Plan  Encounter for pain management The patient's Controlled Substance registry is reviewed and compliance confirmed. Adequacy of  Pain control and level of function is assessed. Medication dosing is adjusted as deemed appropriate. Twelve weeks of medication is prescribed , patient signs for the script and is provided with a follow up appointment in 8 weeks Patient NEEDS to establish with a pain clinic , and this is being worked on, UDS sent   Depression with anxiety Teletherapy in office arranged at this visit as she has not been able to speak with therapist in the past and will benefit and states she wants therapy Paxil added to her regime for GAD and depression   NICOTINE ADDICTION Asked: currently smokes 1 PPD Assess: unwilling to committing to quit currently , due to stress Advise: needs to quit , has been treated for lung cancer in the past, hence risk of cancer is esp high, also CAD and stroke Assist: counseled for 5 mins , and literature also provided Arrange 2 month follow up

## 2018-07-11 NOTE — Assessment & Plan Note (Addendum)
Asked: currently smokes 1 PPD Assess: unwilling to committing to quit currently , due to stress Advise: needs to quit , has been treated for lung cancer in the past, hence risk of cancer is esp high, also CAD and stroke Assist: counseled for 5 mins , and literature also provided Arrange 2 month follow up

## 2018-07-11 NOTE — Assessment & Plan Note (Signed)
Teletherapy in office arranged at this visit as she has not been able to speak with therapist in the past and will benefit and states she wants therapy Paxil added to her regime for GAD and depression

## 2018-07-11 NOTE — Assessment & Plan Note (Signed)
The patient's Controlled Substance registry is reviewed and compliance confirmed. Adequacy of  Pain control and level of function is assessed. Medication dosing is adjusted as deemed appropriate. Twelve weeks of medication is prescribed , patient signs for the script and is provided with a follow up appointment in 8 weeks Patient NEEDS to establish with a pain clinic , and this is being worked on, UDS sent

## 2018-07-14 ENCOUNTER — Telehealth: Payer: Self-pay | Admitting: Family Medicine

## 2018-07-14 NOTE — Telephone Encounter (Signed)
Please call the pt regarding her Hydrocodone prescription--she LVM that the date was wrong

## 2018-07-14 NOTE — Telephone Encounter (Signed)
Fill Date ID Written Drug Qty Days Prescriber Rx # Pharmacy Refill Daily Dose * Pymt Type PMP  06/18/2018  1  06/16/2018  Hydrocodone-Acetamin 7.5-325  90.00 30 [name]  [id number]  [place] ([id number])  1/1 22.50 MME Comm Ins  [place]  06/18/2018  1  05/04/2018  Carisoprodol 350 Mg Tablet  30.00 30 [name]  [id number]  [place] ([id number])  2/3 0.28 LME Comm Ins  [place]  05/21/2018  1  05/20/2018  Hydrocodone-Acetamin 7.5-325  90.00 30 [name]  [id number]  [place] ([id number])  1/1 22.50 MME Comm Ins  [place]  05/21/2018  1  05/04/2018  Carisoprodol 350 Mg Tablet  30.00 30 [name]  [id number]  [place] ([id number])  1/3 0.28 LME Comm Ins  [place]  04/21/2018  1  04/21/2018  Hydrocodone-Acetamin 7.5-325  90.00 30 [name]  [id number]  [place] ([id number])  1/1 22.50 MME Comm Ins  [place]  03/29/2018  1  03/15/2018  Hydrocodone-Acetamin 7.5-325  28.00 14 [name]  [id number]  [place] ([id number])  1/1 15.00 MME Comm Ins  [place]  03/15/2018  1  03/15/2018  Hydrocodone-Acetamin 10-325 Mg  28.00 14 [name]  [id number]  [place] ([id number])  1/1 20.00 MME Comm Ins  [place]  03/15/2018  1  03/15/2018  Carisoprodol 350 Mg Tablet  60.00 30 [name]  [id number]  [place] ([id number])  1/1 0.56 LME Comm Ins  [place]  02/15/2018  1  02/15/2018  Hydrocodone-Acetamin 10-325 Mg  105.00 26 [name]  [id number]  [place]

## 2018-07-15 ENCOUNTER — Other Ambulatory Visit: Payer: Self-pay

## 2018-07-15 ENCOUNTER — Telehealth: Payer: Self-pay | Admitting: Family Medicine

## 2018-07-15 MED ORDER — CARISOPRODOL 350 MG PO TABS: ORAL_TABLET | ORAL | 0 refills | Status: DC

## 2018-07-15 NOTE — Telephone Encounter (Signed)
See duplicate note sent to MD

## 2018-07-15 NOTE — Telephone Encounter (Signed)
Patient is requesting a refill on her Soma & hydrocodone. She states the pharmacy does not deliver on sat or Sunday.  She needs her medication refilled by tomorrow  So they will deliver it to her.   -she states she has not heard anything from [facility], I will call them this morning.

## 2018-07-15 NOTE — Telephone Encounter (Signed)
Fill Date ID Written Drug Qty Days Prescriber Rx # Pharmacy Refill Daily Dose * Pymt Type PMP  06/18/2018  1  06/16/2018  Hydrocodone-Acetamin 7.5-325  90.00 30 [name]  [id number]  [place] ([id number])  1/1 22.50 MME Comm Ins  [place]  06/18/2018  1  05/04/2018  Carisoprodol 350 Mg Tablet  30.00 30 [name]  [id number]  [place] ([id number])  2/3 0.28 LME Comm Ins  [place]  05/21/2018  1  05/20/2018  Hydrocodone-Acetamin 7.5-325  90.00 30 [name]  [id number]  [place] ([id number])  1/1 22.50 MME Comm Ins  [place]  05/21/2018  1  05/04/2018  Carisoprodol 350 Mg Tablet  30.00 30 [name]  [id number]  [place] ([id number])  1/3 0.28 LME Comm Ins  [place]  04/21/2018  1  04/21/2018  Hydrocodone-Acetamin 7.5-325  90.00 30 [name]  [id number]  [place] ([id number])  1/1 22.50 MME Comm Ins  [place]  03/29/2018  1  03/15/2018  Hydrocodone-Acetamin 7.5-325  28.00 14 [name]  [id number]  [place] ([id number])  1/1 15.00 MME Comm Ins  [place]  03/15/2018  1  03/15/2018  Hydrocodone-Acetamin 10-325 Mg  28.00 14 [name]  [id number]  [place] ([id number])  1/1 20.00 MME Comm Ins  [place]  03/15/2018  1  03/15/2018  Carisoprodol 350 Mg Tablet  60.00 30 [name]  [id number]  [place] ([id number])  1/1 0.56 LME Comm Ins    02/15/2018  1  02/15/2018  Hydrocodone-Acetamin 10-325 Mg  105.00 26 [name]  [id number]  [place]

## 2018-07-15 NOTE — Telephone Encounter (Signed)
They do not deliver on weekends due to no driver. Gave verbal ok to deliver tomorrow

## 2018-07-15 NOTE — Telephone Encounter (Signed)
Aftyer you verify with the pharmacy that info she has provided as  no weekend delivery,also verify tha they actually deliver her meds I will send in or give verbal message to deliver meds due on a Saturday,or Sundayu to be delivered on Friday before

## 2018-07-21 ENCOUNTER — Telehealth: Payer: Self-pay

## 2018-07-21 NOTE — Telephone Encounter (Signed)
2nd attempt [facility] - Patient reports that she is in [place] at [facility].  Patient reports that she will call me back when she returns home

## 2018-07-22 ENCOUNTER — Telehealth: Payer: Self-pay | Admitting: Family Medicine

## 2018-07-22 NOTE — Telephone Encounter (Signed)
[facility] to schedule additional imaging per letter patient received 07/05/18, spoke w/ Bethena Roys whom says this was completed 06/15/18.

## 2018-07-22 NOTE — Telephone Encounter (Signed)
Wrong patient

## 2018-07-26 ENCOUNTER — Telehealth: Payer: Self-pay

## 2018-07-26 DIAGNOSIS — F418 Other specified anxiety disorders: Secondary | ICD-10-CM

## 2018-07-26 NOTE — BH Specialist Note (Signed)
[place] Initial Clinical Assessment  MRN: [id number] NAME: [name] Date: 07/26/18   Total time: 1 hour  Type of Contact: Type of Contact: Phone Call Initial Contact Patient consent obtained: Patient consent obtained for Virtual Visit: (NA) Reason for Visit today: Reason for Your Call/Visit Today: [facility] Phone Intake Assessment   Treatment History Patient recently received Inpatient Treatment: Have You Recently Been in Any Inpatient Treatment (Hospital/Detox/Crisis Center/28-Day Program)?: No  Facility/Program:  No  Date of discharge:  No Patient currently being seen by therapist/psychiatrist: Do You Currently Have a Therapist/Psychiatrist?: No Patient currently receiving the following services: Patient Currently Receiving the Following Services:: Medication Management(  PCP prescribes psychiatric medication management )  Psychiatric History  Past Psychiatric History/Hospitalization(s): Anxiety: Yes Bipolar Disorder: No Depression: Yes Mania: No Psychosis: No Schizophrenia: No Personality Disorder: No Hospitalization for psychiatric illness: No History of Electroconvulsive Shock Therapy: No Prior Suicide Attempts: No Decreased need for sleep: No  Euphoria: No Self Injurious behaviors No Family History of mental illness: No Family History of substance abuse: No  Substance Abuse: No  DUI: No  Insomnia: No  History of violence No  Physical, sexual or emotional abuse:No  Prior outpatient mental health therapy: - Yes , unable to remember the dates or the name of the facility.    Clinical Assessment:  PHQ-9 Assessments: Depression screen [facility] 2/9 07/26/2018 06/30/2018 04/21/2018  Decreased Interest 2 2 3   Down, Depressed, Hopeless 2 2 2   PHQ - 2 Score 4 4 5   Altered sleeping 2 2 3   Tired, decreased energy 1 2 3   Change in appetite 1 1 3   Feeling bad or failure about yourself  3 2 2   Trouble concentrating 1 1 2   Moving slowly or fidgety/restless 0 2 3   Suicidal thoughts 0 0 2  PHQ-9 Score 12 14 23   Difficult doing work/chores Somewhat difficult Somewhat difficult Extremely dIfficult  Some recent data might be hidden    GAD-7 Assessments: GAD 7 : Generalized Anxiety Score 07/26/2018  Nervous, Anxious, on Edge 2  Control/stop worrying 2  Worry too much - different things 2  Trouble relaxing 1  Restless 1  Easily annoyed or irritable 2  Afraid - awful might happen 2  Total GAD 7 Score 12  Anxiety Difficulty Somewhat difficult     Social Functioning Social maturity: Social Maturity: Responsible Social judgement: Social Judgement: Normal  Stress Current stressors: Current Stressors: (Depression, anxiety assoicated with chronic pain) Familial stressors: Familial Stressors: None Sleep: Sleep: (Wakes up throughout the night due to chronic pain ) Appetite: Appetite: No problems Coping ability: Coping ability: Exhausted, Overwhelmed Patient taking medications as prescribed: Patient taking medications as prescribed: Yes  Current medications:  Outpatient Encounter Medications as of 07/26/2018  Medication Sig  . Biotin 7500 MCG TABS Take 1 tablet by mouth daily.  . carisoprodol (SOMA) 350 MG tablet One tablet at bedtime for muscle spasm  . cetirizine (ZYRTEC) 10 MG tablet Take 1 tablet (10 mg total) daily by mouth.  . clopidogrel (PLAVIX) 75 MG tablet Take 37.5 mg by mouth daily.  Marland Kitchen EPINEPHrine 0.3 mg/0.3 mL IJ SOAJ injection   . ferrous sulfate 325 (65 FE) MG tablet Take 325 mg by mouth daily with breakfast.  . flecainide (TAMBOCOR) 150 MG tablet Take 75 mg by mouth 2 (two) times daily.   Marland Kitchen HYDROcodone-acetaminophen (NORCO) 7.5-325 MG tablet Take one tablet by mouth three times daily for pain  . [START ON 08/17/2018] HYDROcodone-acetaminophen (NORCO) 7.5-325 MG  tablet Take one tablet by mouth three times daily for pain  . metoprolol succinate (TOPROL-XL) 25 MG 24 hr tablet Take 12.5 mg by mouth daily.  Marland Kitchen omega-3 acid ethyl esters  (LOVAZA) 1 g capsule Take by mouth.  . pantoprazole (PROTONIX) 40 MG tablet Take 40 mg by mouth daily.  . rosuvastatin (CRESTOR) 10 MG tablet Take 1 tablet (10 mg total) by mouth daily.  . traZODone (DESYREL) 50 MG tablet Take 1 tablet (50 mg total) by mouth at bedtime.  . [DISCONTINUED] amLODipine (NORVASC) 5 MG tablet Take 5 mg by mouth daily.     No facility-administered encounter medications on file as of 21-Aug-2018.     Self-harm Behaviors Risk Assessment Self-harm risk factors: Self-harm risk factors: (NA) Patient endorses recent thoughts of harming self: Have you recently had any thoughts about harming yourself?: No  [place] Suicide Severity Rating Scale:  C-SRSS 08-21-2018  1. Wish to be Dead No  2. Suicidal Thoughts No  6. Suicide Behavior Question No    Danger to Others Risk Assessment Danger to others risk factors: Danger to Others Risk Factors: No risk factors noted Patient endorses recent thoughts of harming others: Notification required: No need or identified person  Dynamic Appraisal of Situational Aggression (DASA): No flowsheet data found.  Substance Use Assessment Patient recently consumed alcohol:  No  Alcohol Use Disorder Identification Test (AUDIT):  Alcohol Use Disorder Test (AUDIT) 2018/08/21  1. How often do you have a drink containing alcohol? 0  2. How many drinks containing alcohol do you have on a typical day when you are drinking? 0  3. How often do you have six or more drinks on one occasion? 0  AUDIT-C Score 0  Intervention/Follow-up AUDIT Score <7 follow-up not indicated   Patient recently used drugs:  No Patient is concerned about dependence or abuse of substances:  No    Goals, Interventions and Follow-up Plan Goals: Increase healthy adjustment to current life circumstances Interventions: Supportive Counseling and Medication Monitoring Follow-up Plan: [facility] Phone FOllow Up   Summary of Clinical Assessment Summary:   Patient is a 67 year old  female. Patient reports depression and anxiety associated with chronic pain.  Patient reports poor sleep due to waking up in pain.  Writer discussed mindfulness with the patient. Patient reports that she has an upcoming appt with a pain management physician in [place].    Patient reports reports that she is single and she lives alone with her five dogs.  Patient does not have any children.  Patient has received social security since 2000.  Patient denies having any family or friends for support. Depression symptoms islolates herself from other, depressive episodes  Patient reports that she participates with adopting animals for fun.  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 Patient reports that she likes to be able to help animals.  Patient reports that she would like to do more for animals but she does not have transportation   Patient denies SI/HI/Psychosis/Substance Abuse. If your symptoms worsen or you have thoughts of suicide/homicide, PLEASE SEEK IMMEDIATE MEDICAL ATTENTION.  You may always call:  National Suicide Hotline: 865-020-7062;  [place] Crisis Line: (312)470-2477;  Crisis Recovery in [place]: 614 766 3542.  These are available 24 hours a day, 7 days a week.  Patient reports that she has eceived outpatient servives in the past but she cannot remember the nameor the date of the facility. Patient was in a inpatient psychiatric hospital 15 years ago but she  is not able to remember the name and the date of the facility.    During the next session the patient will continue to work on mindfulness and contemplating engaging in groups that are animal foster parents.        Graciella Freer LaVerne, LCAS-A

## 2018-07-27 ENCOUNTER — Telehealth: Payer: Self-pay | Admitting: Family Medicine

## 2018-07-27 NOTE — Telephone Encounter (Signed)
Letter printed to be signed and faxed

## 2018-07-27 NOTE — Telephone Encounter (Signed)
Patient missed her appointment with [facility] this morning, she says her transportation did not show.  **requesting a letter that needs to state that it is medically necessisary for her to have transportation to & from pain management every month.**  Fax# [place] [employer] 336/ [phone]

## 2018-08-04 DIAGNOSIS — H04123 Dry eye syndrome of bilateral lacrimal glands: Secondary | ICD-10-CM | POA: Diagnosis not present

## 2018-08-04 NOTE — Progress Notes (Signed)
[name] is a 67 y.o. year old female with a history of depression, anxiety, chronic pain.  Psychosocial stressors including loneliness and chronic pain. Per PCP note, she was started on Paxil (it was not reflected in medication list)  Psychiatry admission: 15 years ago for depression Previous suicide attempt: denies  Recommendation - If the patient is started on Paxil, would recommend slow uptitration given its potential interaction with metoprolol (if this medication is active).  - [place] specialist to provide problem solving therapy, coach behavioral activation

## 2018-08-05 ENCOUNTER — Other Ambulatory Visit: Payer: Self-pay

## 2018-08-05 MED ORDER — TRAZODONE HCL 50 MG PO TABS: 50.0000 mg | ORAL_TABLET | Freq: Every day | ORAL | 0 refills | Status: DC

## 2018-08-10 ENCOUNTER — Telehealth: Payer: Self-pay

## 2018-08-10 NOTE — Telephone Encounter (Signed)
[facility] - Left Msg 

## 2018-08-16 ENCOUNTER — Encounter: Payer: Self-pay | Admitting: Family Medicine

## 2018-08-16 ENCOUNTER — Telehealth: Payer: Self-pay | Admitting: Family Medicine

## 2018-08-16 ENCOUNTER — Other Ambulatory Visit (HOSPITAL_COMMUNITY)
Admission: RE | Admit: 2018-08-16 | Discharge: 2018-08-16 | Disposition: A | Discharge status: Home / Self Care | Payer: Medicare Other | Source: Other Acute Inpatient Hospital | Attending: Family Medicine | Admitting: Family Medicine

## 2018-08-16 ENCOUNTER — Ambulatory Visit (INDEPENDENT_AMBULATORY_CARE_PROVIDER_SITE_OTHER): Payer: Medicare Other | Admitting: Family Medicine

## 2018-08-16 VITALS — BP 128/70 | HR 66 | Resp 12 | Ht 63.0 in | Wt 136.1 lb

## 2018-08-16 DIAGNOSIS — R52 Pain, unspecified: Secondary | ICD-10-CM | POA: Diagnosis not present

## 2018-08-16 DIAGNOSIS — F17210 Nicotine dependence, cigarettes, uncomplicated: Secondary | ICD-10-CM

## 2018-08-16 DIAGNOSIS — I10 Essential (primary) hypertension: Secondary | ICD-10-CM | POA: Diagnosis not present

## 2018-08-16 DIAGNOSIS — F17200 Nicotine dependence, unspecified, uncomplicated: Secondary | ICD-10-CM

## 2018-08-16 MED ORDER — CARISOPRODOL 350 MG PO TABS: 350.0000 mg | ORAL_TABLET | Freq: Every day | ORAL | 2 refills | Status: AC

## 2018-08-16 MED ORDER — HYDROCODONE-ACETAMINOPHEN 7.5-325 MG PO TABS: ORAL_TABLET | ORAL | 0 refills | Status: AC

## 2018-08-16 MED ORDER — TRAZODONE HCL 50 MG PO TABS: 25.0000 mg | ORAL_TABLET | Freq: Every evening | ORAL | 3 refills | Status: DC | PRN

## 2018-08-16 NOTE — Progress Notes (Signed)
   [name]     MRN: [id number]      DOB: Sep 22, 1951   HPI Ms. Spader is here for follow up and re-evaluation of chronic medical conditions, medication management and review of any available recent lab and radiology data.  Still has not been able to make and keep appointment with a pain clinic, and states that her pain is uncontrolled, thinks that the medication she is taking is of no benefit, but does not want to discontinue as she reports poor function, and chronic uncontroled right ankle and foot pain. Wants help and will go " as far as [facility] or [facility] if unable to get local help. Transportation challenges prevented her from getting to her scheduled appt and no opportunity  To reschedule. Smokes approx 15 cigarettes/ day, trying to quit, has a friend coming to stay with her who cannot tolerate cigarette smoke Doing well with her dogs, now has 6 , and one was recently adopted in [place]. Less depressed, had 1 teleconference an none since she reports, not particularly ;laly keen on trying to continue that contact Preventive health is updated, specifically  Cancer screening and Immunization.   Neither Rheumatology, nor orthopedics have anything to offer as far as alleviation of her pain, which is mainly in the right foot   ROS Denies recent fever or chills. Denies sinus pressure, nasal congestion, ear pain or sore throat. Denies chest congestion, productive cough or wheezing. Denies chest pains, palpitations and leg swelling Denies abdominal pain, nausea, vomiting,diarrhea or constipation.   Denies dysuria, frequency, hesitancy or incontinence. Denies skin break down or rash.   PE  BP 128/70   Pulse 66   Resp 12   Ht 5\' 3"  (1.6 m)   Wt 136 lb 1.9 oz (61.7 kg)   SpO2 96% Comment: room air  BMI 24.11 kg/m   Patient alert and oriented and in no cardiopulmonary distress.  HEENT: No facial asymmetry, EOMI,   oropharynx pink and moist.  Neck supple no JVD, no mass.  Chest: Clear to  auscultation bilaterally.  CVS: S1, S2 no murmurs, no S3.Regular rate.  ABD: Soft non tender.   Ext: No edema  RZ:NBVAPOLID  ROM spine, and right anlkle Skin: Intact, no ulcerations or rash noted.  Psych: Good eye contact, normal affect. Memory intact not anxious or depressed appearing.  CNS: CN 2-12 intact, power,  normal throughout.no focal deficits noted.   Assessment & Plan  Encounter for pain management The patient's Controlled Substance registry is reviewed and compliance confirmed. Adequacy of  Pain control and level of function is assessed. Medication dosing is adjusted as deemed appropriate. Twelve weeks of medication is prescribed , patient signs for the script and is provided with a follow up appointment between 11 to 12 weeks . Needs appt in pain clinic asap, will discuss with referral staff in am  Essential hypertension Controlled, no change in medication   NICOTINE ADDICTION Asked : confirms currently smokes 15 cigarettes / day Assess: unwilling to set quit date but cutting back Advise: already dx with lung cancer, therefore risk increases for recurrence with ongoing smoking, also increased risk for heart disease and satroke andall types of cancer Assist: counseled for5 mins and literature also offered for help  Arrange: f/u in 12 weeks

## 2018-08-16 NOTE — Progress Notes (Signed)
Fill Date ID Written Drug Qty Days Prescriber Rx # Pharmacy Refill Daily Dose * Pymt Type PMP  07/16/2018  1  07/13/2018  Hydrocodone-Acetamin 7.5-325  90.00 30 [name]  [id number]  [place] ([id number])  0/0 22.50 MME Comm Ins  [place]  07/16/2018  1  05/04/2018  Carisoprodol 350 Mg Tablet  30.00 30 [name]  [id number]  [place] ([id number])  2/3 0.28 LME Comm Ins  [place]  06/18/2018  1  06/16/2018  Hydrocodone-Acetamin 7.5-325  90.00 30 [name]  [id number]  [place] ([id number])  0/0 22.50 MME Comm Ins  [place]  06/18/2018  1  05/04/2018  Carisoprodol 350 Mg Tablet  30.00 30 [name]  [id number]  [place] ([id number])  1/3 0.28 LME Comm Ins  [place]  05/21/2018  1  05/04/2018  Carisoprodol 350 Mg Tablet  30.00 30 [name]  [id number]  [place] ([id number])  0/3 0.28 LME Comm Ins  [place]  05/21/2018  1  05/20/2018  Hydrocodone-Acetamin 7.5-325  90.00 30 [name]  [id number]  [place] ([id number])  0/0 22.50 MME Comm Ins  [place]  04/21/2018  1  04/21/2018  Hydrocodone-Acetamin 7.5-325  90.00 30 [name]  [id number]  [place] ([id number])  0/0 22.50 MME Comm Ins  [place]  03/29/2018  1  03/15/2018  Hydrocodone-Acetamin 7.5-325  28.00 14 [name]  [id number]  [place] ([id number])  0/0 15.00 MME Comm Ins  [place]  03/15/2018  1  03/15/2018  Hydrocodone-Acetamin 10-325 Mg  28.00 14 [name]  [id number]  [place] ([id number])  0/0 20.00 MME Comm Ins  [place]  03/15/2018  1  03/15/2018  Carisoprodol 350 Mg Tablet  60.00 30 [name]  [id number]  [place] ([id number])  0/0 0.56 LME Comm Ins  [place]  02/15/2018  1  02/15/2018  Hydrocodone-Acetamin 10-325 Mg  105.00 26 [name]  [id number]  [place] ([id number])  0/0 40.38 MME Comm Ins  [place]  02/05/2018  1  02/05/2018  Oxycodon-Acetaminophen 7.5-325  90.00 30 [name]  [id number]  [place] ([id number])  0/0 33.75 MME Comm Ins  [place]  02/01/2018  1  02/01/2018  Carisoprodol 350 Mg Tablet  60.00 30 [name]  [id number]  [place] ([id number])  0/1 0.56 LME Comm Ins  [place]  02/01/2018  1  02/01/2018  Morphine Sulfate Ir 15 Mg Tab  40.00 13 [name]  [id number]  [place] ([id number])  0/0 46.15 MME Comm Ins  [place]  01/12/2018  1  12/07/2017  Hydrocodone-Acetamin 10-325 Mg  105.00 27 [name]  [id number]  [place] ([id number])  0/0 38.89  MME Comm Ins  [place]  12/14/2017  1  11/11/2017  Lyrica 150 Mg Capsule  90.00 30 [name]  [id number]  [place] ([id number])  1/1 2.88 LME Comm Ins  [place]  12/14/2017  1  12/07/2017  Hydrocodone-Acetamin 10-325 Mg  105.00 27 [name]  [id number]  [place] ([id number])  0/0 38.89 MME Comm Ins  [place]  11/12/2017  1  11/11/2017  Lyrica 150 Mg Capsule  90.00 30 [name]  [id number]  [place] ([id number])  0/1 2.88 LME Comm Ins  [place]  11/12/2017  1  11/11/2017  Carisoprodol 350 Mg Tablet  60.00 30 [name]  [id number]  [place] ([id number])  0/1 0.56 LME Comm Ins  [name]  11/12/2017  1  11/11/2017  Hydrocodone-Acetamin 10-325 Mg  115.00 29 [name]  [id number]  [place] ([id number])  0/0 39.66 MME Comm Ins    10/16/2017  1  10/16/2017  Hydrocodone-Acetamin 10-325 Mg  120.00 30 [name]  [id number]  [place] [phone])  0/0 40.00 MME Comm Ins  [place]  10/16/2017  1  08/19/2017  Lyrica 150 Mg Capsule  90.00 30 [name]  [id number]  [place] ([id number])  1/1 2.88 LME Comm Ins  [place]  09/29/2017  1  08/19/2017  Carisoprodol 350 Mg Tablet  60.00 30 [name]  [id number]  [place] ([id number])  1/1 0.56 LME Comm Ins  [place]  09/18/2017  1  08/19/2017  Hydrocodone-Acetamin 10-325 Mg  105.00 27 [name]  [id number]  [place] ([id number])  0/0 38.89 MME Comm Ins  [place]  08/19/2017  1  08/19/2017  Carisoprodol 350 Mg Tablet  60.00 30

## 2018-08-16 NOTE — Assessment & Plan Note (Addendum)
The patient's Controlled Substance registry is reviewed and compliance confirmed. Adequacy of  Pain control and level of function is assessed. Medication dosing is adjusted as deemed appropriate. Twelve weeks of medication is prescribed , patient signs for the script and is provided with a follow up appointment between 11 to 12 weeks . Needs appt in pain clinic asap, will discuss with referral staff in am

## 2018-08-16 NOTE — Assessment & Plan Note (Signed)
Controlled, no change in medication  

## 2018-08-16 NOTE — Assessment & Plan Note (Signed)
Asked : confirms currently smokes 15 cigarettes / day Assess: unwilling to set quit date but cutting back Advise: already dx with lung cancer, therefore risk increases for recurrence with ongoing smoking, also increased risk for heart disease and satroke andall types of cancer Assist: counseled for5 mins and literature also offered for help  Arrange: f/u in 12 weeks

## 2018-08-16 NOTE — Patient Instructions (Addendum)
F/u in 12 weeks, call if you need me sooner  Priority for you is to get an appointment with a pain clinic and to stop smoking.  You will get a call this week about your appt.  Now smoking 15 to 20 cigarettesfdaily, continue to plan to quit  Thank you  for choosing [facility]. We consider it a privelige to serve you.  Delivering excellent health care in a caring and  compassionate way is our goal.  Partnering with you,  so that together we can achieve this goal is our strategy.   Urine test today

## 2018-08-16 NOTE — Telephone Encounter (Signed)
Please send a message re the status of an appt for the pt.  If Dr Hardin Negus office is unable to provide an appt , then please refer her to [facility] pain clinic, she is already treated there for chronic dry eyes, that if there is no alternat clinic in [place] or [place]. Patient NEEDS to be treated in a pain clinic, I am aware that she has missed a slot at  [facility] which was willing to see her a sshe would have been too late for the appt  ( has transportation challenges) Pls speak directly with me if still having a road block, so hopsonwe can try to get closure

## 2018-08-17 ENCOUNTER — Encounter: Payer: Self-pay | Admitting: Family Medicine

## 2018-08-17 ENCOUNTER — Telehealth: Payer: Self-pay

## 2018-08-17 DIAGNOSIS — F418 Other specified anxiety disorders: Secondary | ICD-10-CM

## 2018-08-17 LAB — MISC LABCORP TEST (SEND OUT): Labcorp test code: 733726

## 2018-08-17 NOTE — Telephone Encounter (Signed)
[facility] [place], [place] [address] 7 Days a [place] PHONE [phone]  Patient is scheduled with Emelia Loron Monday, October 14th at 1030 am. I have left her a voicemail to call me back, mailed her information about her appointment by letter with my direct extension , what to bring to her appointment, her doctors name , and the practice information. Including a pamphlet!!

## 2018-08-23 NOTE — BH Specialist Note (Signed)
[place] Telephone Follow-up  MRN: [id number] NAME: [name] Date: 08/17/2018   Total time: 30 minutes Call number: 2/6  Reason for call today:  [facility] Phone Follow UP   PHQ-9 Scores:  Depression screen [facility] 2/9 08/17/2018 08/16/2018 14-Aug-2018 06/30/2018 04/21/2018  Decreased Interest 2 3 2 2 3   Down, Depressed, Hopeless 3 3 2 2 2   PHQ - 2 Score 5 6 4 4 5   Altered sleeping 1 3 2 2 3   Tired, decreased energy 2 3 1 2 3   Change in appetite 1 0 1 1 3   Feeling bad or failure about yourself  3 0 3 2 2   Trouble concentrating 1 1 1 1 2   Moving slowly or fidgety/restless 0 0 0 2 3  Suicidal thoughts 0 0 0 0 2  PHQ-9 Score 13 13 12 14 23   Difficult doing work/chores Somewhat difficult - Somewhat difficult Somewhat difficult Extremely dIfficult  Some recent data might be hidden      GAD-7 Scores:  GAD 7 : Generalized Anxiety Score 08/17/2018 08/14/2018  Nervous, Anxious, on Edge 2 2  Control/stop worrying 3 2  Worry too much - different things 3 2  Trouble relaxing 2 1  Restless 1 1  Easily annoyed or irritable 1 2  Afraid - awful might happen 2 2  Total GAD 7 Score 14 12  Anxiety Difficulty Somewhat difficult Somewhat difficult    Stress Current stressors:  Her friend is dying of cancer. Sleep:  Decreased unable to stay asleep Appetite:  Varies  Coping ability:  Overwhelmed and Exhausted  Patient taking medications as prescribed:  Yes   Current medications:  Outpatient Encounter Medications as of 08/17/2018  Medication Sig  . Biotin 7500 MCG TABS Take 1 tablet by mouth daily.  . carisoprodol (SOMA) 350 MG tablet Take 1 tablet (350 mg total) by mouth at bedtime.  . cetirizine (ZYRTEC) 10 MG tablet Take 1 tablet (10 mg total) daily by mouth.  . clopidogrel (PLAVIX) 75 MG tablet Take 37.5 mg by mouth daily.  Marland Kitchen EPINEPHrine 0.3 mg/0.3 mL IJ SOAJ injection   . ferrous sulfate 325 (65 FE) MG tablet Take 325 mg by mouth daily with breakfast.  . flecainide (TAMBOCOR)  150 MG tablet Take 75 mg by mouth 2 (two) times daily.   Marland Kitchen HYDROcodone-acetaminophen (NORCO) 7.5-325 MG tablet Take one tablet three times daily for pain  . [START ON 09/16/2018] HYDROcodone-acetaminophen (NORCO) 7.5-325 MG tablet Take one tablet three times daily for pain  . [START ON 10/16/2018] HYDROcodone-acetaminophen (NORCO) 7.5-325 MG tablet Take one tablet three times daily for pain  . metoprolol succinate (TOPROL-XL) 25 MG 24 hr tablet Take 12.5 mg by mouth daily.  . pantoprazole (PROTONIX) 40 MG tablet Take 40 mg by mouth daily.  . rosuvastatin (CRESTOR) 10 MG tablet Take 1 tablet (10 mg total) by mouth daily.  . traZODone (DESYREL) 50 MG tablet Take 0.5-1 tablets (25-50 mg total) by mouth at bedtime as needed for sleep.   No facility-administered encounter medications on file as of 08/17/2018.      Self-harm Behaviors Risk Assessment Self-harm risk factors:  None Reported  Patient endorses recent thoughts of harming self:  None Reported  [place] Suicide Severity Rating Scale: No flowsheet data found. C-SRSS Aug 14, 2018  1. Wish to be Dead No  2. Suicidal Thoughts No  6. Suicide Behavior Question No     Danger to Others Risk Assessment Danger to others risk factors:  NA Patient endorses  recent thoughts of harming others:  NA   Substance Use Assessment Patient recently consumed alcohol:  NA  Alcohol Use Disorder Identification Test (AUDIT):  Alcohol Use Disorder Test (AUDIT) 07/26/2018  1. How often do you have a drink containing alcohol? 0  2. How many drinks containing alcohol do you have on a typical day when you are drinking? 0  3. How often do you have six or more drinks on one occasion? 0  AUDIT-C Score 0  Intervention/Follow-up AUDIT Score <7 follow-up not indicated   Patient recently used drugs:  No  Opioid Risk Assessment:No    Goals, Interventions and Follow-up Plan Goals: Increase healthy adjustment to current life circumstances Interventions: Mindfulness or  Relaxation Training and Supportive Counseling Follow-up Plan: [facility] Phone Follow UP   Summary:   Follow UP - Patient  has a slight  increase in her GAD score/  Patient reports feeling overwhelmed due to looking for a new place to live and comforting a friend that has terminal cancer.  Patient reports that chronic pain continues to cause depression and prevents her from sleeping through the night.   Patient reports feeling guilty because her cancer is in remission for three years and her friends cancer has spread.    Writer discussed practicing mindfulness.  Patient reports feeling conflicted because her friend with cancer does not believe in God however, she does believe in God.    Patient reports that she is having difficulties with her landlord.  Patient reports that her friend that is dying of cancer has offered to assist her in purchasing a home.  Patient denies taking any psychiatric medication.  Patient reports that she does not like to take medication.  Patient reports that she has an upcoming meeting with the Pain Management Clinic on 08-31-2018  Patient denies SI/HI/Psychosis/Substance Abuse. If your symptoms worsen or you have thoughts of suicide/homicide, PLEASE SEEK IMMEDIATE MEDICAL ATTENTION.  You may always call:  National Suicide Hotline: 574-866-4369;  [place] Crisis Line: (430) 690-7752;  Crisis Recovery in [place]: 510 486 6921.  These are available 24 hours a day, 7 days a week.  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   During the next session the patient will discuss how her incorporation of her problem solving solutions assisted her in decreasing her depression and anxiety.     Graciella Freer LaVerne, LCAS-A

## 2018-08-26 DIAGNOSIS — I2510 Atherosclerotic heart disease of native coronary artery without angina pectoris: Secondary | ICD-10-CM | POA: Diagnosis not present

## 2018-08-26 DIAGNOSIS — I6523 Occlusion and stenosis of bilateral carotid arteries: Secondary | ICD-10-CM | POA: Diagnosis not present

## 2018-08-26 DIAGNOSIS — E782 Mixed hyperlipidemia: Secondary | ICD-10-CM | POA: Diagnosis not present

## 2018-08-26 DIAGNOSIS — I10 Essential (primary) hypertension: Secondary | ICD-10-CM | POA: Diagnosis not present

## 2018-08-26 DIAGNOSIS — I480 Paroxysmal atrial fibrillation: Secondary | ICD-10-CM | POA: Diagnosis not present

## 2018-08-26 DIAGNOSIS — I70219 Atherosclerosis of native arteries of extremities with intermittent claudication, unspecified extremity: Secondary | ICD-10-CM | POA: Diagnosis not present

## 2018-08-26 DIAGNOSIS — I700 Atherosclerosis of aorta: Secondary | ICD-10-CM | POA: Diagnosis not present

## 2018-08-26 DIAGNOSIS — R002 Palpitations: Secondary | ICD-10-CM | POA: Diagnosis not present

## 2018-08-31 DIAGNOSIS — M25571 Pain in right ankle and joints of right foot: Secondary | ICD-10-CM | POA: Diagnosis not present

## 2018-08-31 DIAGNOSIS — R5383 Other fatigue: Secondary | ICD-10-CM | POA: Diagnosis not present

## 2018-08-31 DIAGNOSIS — D539 Nutritional anemia, unspecified: Secondary | ICD-10-CM | POA: Diagnosis not present

## 2018-08-31 DIAGNOSIS — G894 Chronic pain syndrome: Secondary | ICD-10-CM | POA: Diagnosis not present

## 2018-08-31 DIAGNOSIS — M129 Arthropathy, unspecified: Secondary | ICD-10-CM | POA: Diagnosis not present

## 2018-08-31 DIAGNOSIS — G8929 Other chronic pain: Secondary | ICD-10-CM | POA: Diagnosis not present

## 2018-08-31 DIAGNOSIS — Z79899 Other long term (current) drug therapy: Secondary | ICD-10-CM | POA: Diagnosis not present

## 2018-09-07 ENCOUNTER — Telehealth: Payer: Self-pay

## 2018-09-07 NOTE — Telephone Encounter (Signed)
[facility] - Left Message  

## 2018-09-14 DIAGNOSIS — M25571 Pain in right ankle and joints of right foot: Secondary | ICD-10-CM | POA: Diagnosis not present

## 2018-09-14 DIAGNOSIS — G894 Chronic pain syndrome: Secondary | ICD-10-CM | POA: Diagnosis not present

## 2018-09-14 DIAGNOSIS — G8929 Other chronic pain: Secondary | ICD-10-CM | POA: Diagnosis not present

## 2018-09-14 DIAGNOSIS — G629 Polyneuropathy, unspecified: Secondary | ICD-10-CM | POA: Diagnosis not present

## 2018-09-22 ENCOUNTER — Telehealth: Payer: Self-pay

## 2018-09-22 NOTE — Telephone Encounter (Signed)
3rd attempt - [facility]  

## 2018-09-27 ENCOUNTER — Ambulatory Visit (INDEPENDENT_AMBULATORY_CARE_PROVIDER_SITE_OTHER): Payer: Medicare Other

## 2018-09-27 VITALS — BP 138/72 | HR 55 | Resp 14 | Ht 63.0 in | Wt 137.0 lb

## 2018-09-27 DIAGNOSIS — Z0000 Encounter for general adult medical examination without abnormal findings: Secondary | ICD-10-CM
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
## 2018-09-27 NOTE — Patient Instructions (Signed)
[name] , Thank you for taking time to come for your Medicare Wellness Visit. I appreciate your ongoing commitment to your health goals. Please review the following plan we discussed and let me know if I can assist you in the future.   Screening recommendations/referrals: Colonoscopy: up to date  Mammogram: up to date  Bone Density: up to date  Recommended yearly ophthalmology/optometry visit for glaucoma screening and checkup Recommended yearly dental visit for hygiene and checkup  Vaccinations: Influenza vaccine: up to date  Pneumococcal vaccine: up to date  Tdap vaccine: up to date  Shingles vaccine: up to date     Advanced directives: Refused   Conditions/risks identified: hard of hearing, chronic pain, smoking, impaired mobility   Next appointment: Wellness visit in one year    Preventive Care 20 Years and Older, Female Preventive care refers to lifestyle choices and visits with your health care provider that can promote health and wellness. What does preventive care include?  A yearly physical exam. This is also called an annual well check.  Dental exams once or twice a year.  Routine eye exams. Ask your health care provider how often you should have your eyes checked.  Personal lifestyle choices, including:  Daily care of your teeth and gums.  Regular physical activity.  Eating a healthy diet.  Avoiding tobacco and drug use.  Limiting alcohol use.  Practicing safe sex.  Taking low-dose aspirin every day.  Taking vitamin and mineral supplements as recommended by your health care provider. What happens during an annual well check? The services and screenings done by your health care provider during your annual well check will depend on your age, overall health, lifestyle risk factors, and family history of disease. Counseling  Your health care provider may ask you questions about your:  Alcohol use.  Tobacco use.  Drug use.  Emotional  well-being.  Home and relationship well-being.  Sexual activity.  Eating habits.  History of falls.  Memory and ability to understand (cognition).  Work and work Statistician.  Reproductive health. Screening  You may have the following tests or measurements:  Height, weight, and BMI.  Blood pressure.  Lipid and cholesterol levels. These may be checked every 5 years, or more frequently if you are over 45 years old.  Skin check.  Lung cancer screening. You may have this screening every year starting at age 44 if you have a 30-pack-year history of smoking and currently smoke or have quit within the past 15 years.  Fecal occult blood test (FOBT) of the stool. You may have this test every year starting at age 15.  Flexible sigmoidoscopy or colonoscopy. You may have a sigmoidoscopy every 5 years or a colonoscopy every 10 years starting at age 84.  Hepatitis C blood test.  Hepatitis B blood test.  Sexually transmitted disease (STD) testing.  Diabetes screening. This is done by checking your blood sugar (glucose) after you have not eaten for a while (fasting). You may have this done every 1-3 years.  Bone density scan. This is done to screen for osteoporosis. You may have this done starting at age 81.  Mammogram. This may be done every 1-2 years. Talk to your health care provider about how often you should have regular mammograms. Talk with your health care provider about your test results, treatment options, and if necessary, the need for more tests. Vaccines  Your health care provider may recommend certain vaccines, such as:  Influenza vaccine. This is recommended every year.  Tetanus, diphtheria, and acellular pertussis (Tdap, Td) vaccine. You may need a Td booster every 10 years.  Zoster vaccine. You may need this after age 22.  Pneumococcal 13-valent conjugate (PCV13) vaccine. One dose is recommended after age 4.  Pneumococcal polysaccharide (PPSV23) vaccine. One  dose is recommended after age 45. Talk to your health care provider about which screenings and vaccines you need and how often you need them. This information is not intended to replace advice given to you by your health care provider. Make sure you discuss any questions you have with your health care provider. Document Released: 11/23/2015 Document Revised: 07/16/2016 Document Reviewed: 08/28/2015 Elsevier Interactive Patient Education  2017 [place] Prevention in the Home Falls can cause injuries. They can happen to people of all ages. There are many things you can do to make your home safe and to help prevent falls. What can I do on the outside of my home?  Regularly fix the edges of walkways and driveways and fix any cracks.  Remove anything that might make you trip as you walk through a door, such as a raised step or threshold.  Trim any bushes or trees on the path to your home.  Use bright outdoor lighting.  Clear any walking paths of anything that might make someone trip, such as rocks or tools.  Regularly check to see if handrails are loose or broken. Make sure that both sides of any steps have handrails.  Any raised decks and porches should have guardrails on the edges.  Have any leaves, snow, or ice cleared regularly.  Use sand or salt on walking paths during winter.  Clean up any spills in your garage right away. This includes oil or grease spills. What can I do in the bathroom?  Use night lights.  Install grab bars by the toilet and in the tub and shower. Do not use towel bars as grab bars.  Use non-skid mats or decals in the tub or shower.  If you need to sit down in the shower, use a plastic, non-slip stool.  Keep the floor dry. Clean up any water that spills on the floor as soon as it happens.  Remove soap buildup in the tub or shower regularly.  Attach bath mats securely with double-sided non-slip rug tape.  Do not have throw rugs and other  things on the floor that can make you trip. What can I do in the bedroom?  Use night lights.  Make sure that you have a light by your bed that is easy to reach.  Do not use any sheets or blankets that are too big for your bed. They should not hang down onto the floor.  Have a firm chair that has side arms. You can use this for support while you get dressed.  Do not have throw rugs and other things on the floor that can make you trip. What can I do in the kitchen?  Clean up any spills right away.  Avoid walking on wet floors.  Keep items that you use a lot in easy-to-reach places.  If you need to reach something above you, use a strong step stool that has a grab bar.  Keep electrical cords out of the way.  Do not use floor polish or wax that makes floors slippery. If you must use wax, use non-skid floor wax.  Do not have throw rugs and other things on the floor that can make you trip. What can I do  with my stairs?  Do not leave any items on the stairs.  Make sure that there are handrails on both sides of the stairs and use them. Fix handrails that are broken or loose. Make sure that handrails are as long as the stairways.  Check any carpeting to make sure that it is firmly attached to the stairs. Fix any carpet that is loose or worn.  Avoid having throw rugs at the top or bottom of the stairs. If you do have throw rugs, attach them to the floor with carpet tape.  Make sure that you have a light switch at the top of the stairs and the bottom of the stairs. If you do not have them, ask someone to add them for you. What else can I do to help prevent falls?  Wear shoes that:  Do not have high heels.  Have rubber bottoms.  Are comfortable and fit you well.  Are closed at the toe. Do not wear sandals.  If you use a stepladder:  Make sure that it is fully opened. Do not climb a closed stepladder.  Make sure that both sides of the stepladder are locked into place.  Ask  someone to hold it for you, if possible.  Clearly mark and make sure that you can see:  Any grab bars or handrails.  First and last steps.  Where the edge of each step is.  Use tools that help you move around (mobility aids) if they are needed. These include:  Canes.  Walkers.  Scooters.  Crutches.  Turn on the lights when you go into a dark area. Replace any light bulbs as soon as they burn out.  Set up your furniture so you have a clear path. Avoid moving your furniture around.  If any of your floors are uneven, fix them.  If there are any pets around you, be aware of where they are.  Review your medicines with your doctor. Some medicines can make you feel dizzy. This can increase your chance of falling. Ask your doctor what other things that you can do to help prevent falls. This information is not intended to replace advice given to you by your health care provider. Make sure you discuss any questions you have with your health care provider. Document Released: 08/23/2009 Document Revised: 04/03/2016 Document Reviewed: 12/01/2014 Elsevier Interactive Patient Education  2017 [place]. 138/72

## 2018-09-28 DIAGNOSIS — G629 Polyneuropathy, unspecified: Secondary | ICD-10-CM | POA: Diagnosis not present

## 2018-09-28 DIAGNOSIS — M19071 Primary osteoarthritis, right ankle and foot: Secondary | ICD-10-CM | POA: Diagnosis not present

## 2018-09-28 DIAGNOSIS — Z79899 Other long term (current) drug therapy: Secondary | ICD-10-CM | POA: Diagnosis not present

## 2018-10-05 ENCOUNTER — Telehealth: Payer: Self-pay

## 2018-10-05 NOTE — Telephone Encounter (Signed)
Several attempts have been made to contact patient without success. Patient will be placed on the inactive list.  If services are needed again.  Please contact [facility] at [phone].    Information will be routed to the PCP and Dr. Hisada  

## 2018-10-11 ENCOUNTER — Telehealth: Payer: Self-pay | Admitting: Family Medicine

## 2018-10-11 NOTE — Telephone Encounter (Signed)
Fill Date ID Written Drug Qty Days Prescriber Rx # Pharmacy Refill Daily Dose * [place]ym[place] Type PMP  09/28/2018  1   09/28/2018  Hydrocodone-Acetamin 10-325 Mg  90.00 30 Ch A  [id number]  [place] ([id number])  0/0 30.00 MME Comm Ins  [place]  09/14/2018  1   09/14/2018  Hydrocodone-Acetamin 10-325 Mg  42.00 14 Ty D  [id number]  [place] ([id number])  0/0 30.00 MME Comm Ins  [place]  08/17/2018  1   08/16/2018  Carisoprodol 350 Mg Tablet  30.00 30 [name]  [id number]  [place] ([id number])  0/2 0.28 LME Comm Ins  [place]  08/16/2018  1   08/16/2018  Hydrocodone-Acetamin 7.5-325  90.00 30 [name]  [id number]  [place] ([id number])  0/0 22.50 MME Comm Ins  [place]  07/16/2018  1   05/04/2018  Carisoprodol 350 Mg Tablet  30.00 30 [name]  [id number]  [place] ([id number])  2/3 0.28 LME Comm Ins  [place]  07/16/2018  1   07/13/2018  Hydrocodone-Acetamin 7.5-325  90.00 30 [name]  [id number]  [place] ([id number][place]  0/0 22.50 MME Comm Ins  [place]  06/18/2018  1   06/16/2018  Hydrocodone-Acetamin 7.5-325  90.00 30 [name]  [id number]  [place] ([id number][place]  0/0 22.50 MME Comm Ins  [place]  06/18/2018  1   05/04/2018  Carisoprodol 350 Mg Tablet  30.00 30 [name]  [id number]  [place] ([id number])  1/3 0.28 LME Comm Ins  [place]  05/21/2018  1   05/04/2018  Carisoprodol 350 Mg Tablet  30.00 30 [name]  [id number]  [place] ([id number])  0/3 0.28 LME Comm Ins  [place]  05/21/2018  1   05/20/2018  Hydrocodone-Acetamin 7.5-325  90.00 30 [name]  [id number]  [place] ([id number])  0/0 22.50 MME Comm Ins  [place]  04/21/2018  1   04/21/2018  Hydrocodone-Acetamin 7.5-325  90.00 30 [name]  [id number]  [place] ([id number])  0/0

## 2018-10-11 NOTE — Telephone Encounter (Signed)
pls import her  Controlled substance registry and send to me Also need o know if she has aCTUALLY senn and kepot an appointment with a pain clinic and updated info on that, thanks

## 2018-10-12 ENCOUNTER — Other Ambulatory Visit: Payer: Self-pay

## 2018-10-12 ENCOUNTER — Telehealth: Payer: Self-pay | Admitting: Family Medicine

## 2018-10-12 DIAGNOSIS — Z9189 Other specified personal risk factors, not elsewhere classified: Secondary | ICD-10-CM

## 2018-10-12 DIAGNOSIS — Z889 Allergy status to unspecified drugs, medicaments and biological substances status: Secondary | ICD-10-CM

## 2018-10-12 MED ORDER — EPINEPHRINE 0.3 MG/0.3ML IJ SOAJ: 0.3000 mg | Freq: Once | INTRAMUSCULAR | 0 refills | Status: AC

## 2018-10-12 NOTE — Telephone Encounter (Signed)
Pt LVM that she needs a new RX sent to the drug store for her Epipen.

## 2018-10-12 NOTE — Progress Notes (Signed)
Epipen ordered 

## 2018-10-27 ENCOUNTER — Other Ambulatory Visit: Payer: Self-pay | Admitting: Medical Oncology

## 2018-10-27 DIAGNOSIS — C3490 Malignant neoplasm of unspecified part of unspecified bronchus or lung: Secondary | ICD-10-CM

## 2018-10-28 DIAGNOSIS — M25571 Pain in right ankle and joints of right foot: Secondary | ICD-10-CM | POA: Diagnosis not present

## 2018-10-28 DIAGNOSIS — Z79899 Other long term (current) drug therapy: Secondary | ICD-10-CM | POA: Diagnosis not present

## 2018-10-28 DIAGNOSIS — G629 Polyneuropathy, unspecified: Secondary | ICD-10-CM | POA: Diagnosis not present

## 2018-10-28 DIAGNOSIS — G8929 Other chronic pain: Secondary | ICD-10-CM | POA: Diagnosis not present

## 2018-10-28 DIAGNOSIS — M79671 Pain in right foot: Secondary | ICD-10-CM | POA: Diagnosis not present

## 2018-11-11 ENCOUNTER — Encounter: Payer: Self-pay | Admitting: Family Medicine

## 2018-11-11 ENCOUNTER — Ambulatory Visit (INDEPENDENT_AMBULATORY_CARE_PROVIDER_SITE_OTHER): Payer: Medicare Other | Admitting: Family Medicine

## 2018-11-11 VITALS — BP 128/70 | HR 82 | Resp 15 | Ht 63.0 in | Wt 137.0 lb

## 2018-11-11 DIAGNOSIS — E785 Hyperlipidemia, unspecified: Secondary | ICD-10-CM

## 2018-11-11 DIAGNOSIS — F17200 Nicotine dependence, unspecified, uncomplicated: Secondary | ICD-10-CM

## 2018-11-11 DIAGNOSIS — F411 Generalized anxiety disorder: Secondary | ICD-10-CM

## 2018-11-11 DIAGNOSIS — F418 Other specified anxiety disorders: Secondary | ICD-10-CM | POA: Diagnosis not present

## 2018-11-11 DIAGNOSIS — F17210 Nicotine dependence, cigarettes, uncomplicated: Secondary | ICD-10-CM

## 2018-11-11 DIAGNOSIS — Z23 Encounter for immunization: Secondary | ICD-10-CM

## 2018-11-11 MED ORDER — PAROXETINE HCL 10 MG PO TABS: 10.0000 mg | ORAL_TABLET | Freq: Every day | ORAL | 4 refills | Status: DC

## 2018-11-11 MED ORDER — BUSPIRONE HCL 5 MG PO TABS: 5.0000 mg | ORAL_TABLET | Freq: Three times a day (TID) | ORAL | 3 refills | Status: DC

## 2018-11-11 NOTE — Patient Instructions (Addendum)
F/U in  8 weeks, call if you need me sooner  You will start new for anxiety, buspar 3 times daily and paxil for anxiety and I will ask therapist to call you  Letter requiring electricity 24/7 because of need for nocturnal oxygen  Pneumonia 23 today  We will send for lab and office visit with pain specialist, will get back re iron  GREAT that you are committed to QUITTING, now at 4 /day, No more buying cigarettes , use all the tools you can to help you to stop

## 2018-11-12 ENCOUNTER — Emergency Department (HOSPITAL_COMMUNITY)
Admission: EM | Admit: 2018-11-12 | Discharge: 2018-11-12 | Disposition: A | Discharge status: Home / Self Care | Payer: Medicare Other | Attending: Emergency Medicine | Admitting: Emergency Medicine

## 2018-11-12 ENCOUNTER — Telehealth: Payer: Self-pay

## 2018-11-12 ENCOUNTER — Encounter (HOSPITAL_COMMUNITY): Payer: Self-pay

## 2018-11-12 ENCOUNTER — Other Ambulatory Visit: Payer: Self-pay

## 2018-11-12 DIAGNOSIS — M79602 Pain in left arm: Secondary | ICD-10-CM | POA: Diagnosis not present

## 2018-11-12 DIAGNOSIS — T7840XA Allergy, unspecified, initial encounter: Secondary | ICD-10-CM | POA: Diagnosis not present

## 2018-11-12 DIAGNOSIS — R110 Nausea: Secondary | ICD-10-CM | POA: Insufficient documentation

## 2018-11-12 DIAGNOSIS — R6883 Chills (without fever): Secondary | ICD-10-CM | POA: Diagnosis not present

## 2018-11-12 DIAGNOSIS — M7989 Other specified soft tissue disorders: Secondary | ICD-10-CM | POA: Insufficient documentation

## 2018-11-12 DIAGNOSIS — Z5321 Procedure and treatment not carried out due to patient leaving prior to being seen by health care provider: Secondary | ICD-10-CM | POA: Diagnosis not present

## 2018-11-12 NOTE — Telephone Encounter (Signed)
Needs to go to nearest urgent care or ED since whole arm red and swollen reportedly

## 2018-11-12 NOTE — Telephone Encounter (Signed)
Pt aware.

## 2018-11-12 NOTE — Telephone Encounter (Signed)
Patient states that a few hours after she received the pneumonia 23 shot yesterday that she could hardly lift her arm it was so sore. Later that evening it started swelling and was red about palm size and this am its swollen and red almost down to her elbow, red and warm. She isn't having any other symptoms or feeling bad. Wants to know what to do. (I have added to her allergy list as adverse reaction) Please advise

## 2018-11-12 NOTE — ED Triage Notes (Signed)
Pt got a pneumonia vaccine yesterday in her left arm and is now having swelling and redness. Is also having some nausea and chills. No fever today

## 2018-11-19 ENCOUNTER — Encounter: Payer: Self-pay | Admitting: *Deleted

## 2018-11-24 DIAGNOSIS — H04123 Dry eye syndrome of bilateral lacrimal glands: Secondary | ICD-10-CM | POA: Diagnosis not present

## 2018-11-24 DIAGNOSIS — Z961 Presence of intraocular lens: Secondary | ICD-10-CM | POA: Diagnosis not present

## 2018-11-26 DIAGNOSIS — M25571 Pain in right ankle and joints of right foot: Secondary | ICD-10-CM | POA: Diagnosis not present

## 2018-11-26 DIAGNOSIS — G894 Chronic pain syndrome: Secondary | ICD-10-CM | POA: Diagnosis not present

## 2018-11-26 DIAGNOSIS — M25512 Pain in left shoulder: Secondary | ICD-10-CM | POA: Diagnosis not present

## 2018-11-26 DIAGNOSIS — Z79899 Other long term (current) drug therapy: Secondary | ICD-10-CM | POA: Diagnosis not present

## 2018-12-05 ENCOUNTER — Encounter: Payer: Self-pay | Admitting: Family Medicine

## 2018-12-05 DIAGNOSIS — Z23 Encounter for immunization: Secondary | ICD-10-CM | POA: Insufficient documentation

## 2018-12-05 NOTE — Assessment & Plan Note (Signed)
Severe and uncontrolled, triggered by poor health of very close friend Start buspar and low dose SSRI, needs therapy , has been referred in the past

## 2018-12-05 NOTE — Assessment & Plan Note (Signed)
Asked:confirms currently smokes cigarettes Assess: Unwilling to quit but cutting back Advise: needs to QUIT to reduce risk of cancer, cardio and cerebrovascular disease Assist: counseled for 5 minutes and literature provided Arrange: follow up in 3 months  

## 2018-12-05 NOTE — Progress Notes (Signed)
   [name]     MRN: [id number]      DOB: 09-18-1951   HPI [name] is here for follow up and re-evaluation of chronic medical conditions, medication management and review of any available recent lab and radiology data.  Preventive health is updated, specifically  Cancer screening and Immunization.   Questions or concerns regarding consultations or procedures which the PT has had in the interim are  Addressed. She is being followed by a new pain clinic which is working well for her The PT denies any adverse reactions to current medications since the last visit.  C/o increased stress and anxiety, a very close friend is dying , and she is also trying tofind better housing, no longer thinking that she is dying, wants to live, continues to take in new dogs, she is not suicidal or homicidal, but anxiety is " off the chart"   ROS Denies recent fever or chills. Denies sinus pressure, nasal congestion, ear pain or sore throat. Denies chest congestion, productive cough or wheezing. Denies chest pains, palpitations and leg swelling Denies abdominal pain, nausea, vomiting,diarrhea or constipation.   Denies dysuria, frequency, hesitancy or incontinence. Denies  Uncontrolled joint pain, swelling and limitation in mobility. Denies headaches, seizures, numbness, or tingling.    PE  BP 128/70   Pulse 82   Resp 15   Ht 5\' 3"  (1.6 m)   Wt 137 lb (62.1 kg)   SpO2 94%   BMI 24.27 kg/m   Patient alert and oriented and in no cardiopulmonary distress.  HEENT: No facial asymmetry, EOMI,   oropharynx pink and moist.  Neck supple no JVD, no mass.  Chest: Clear to auscultation bilaterally.decreased air entry  CVS: S1, S2 no murmurs, no S3.Regular rate.  ABD: Soft non tender.   Ext: No edema  MS: Adequate ROM spine, shoulders, hips and knees.  Skin: Intact, no ulcerations or rash noted.  Psych: Good eye contact,  anxious. Memory intact  anxious and mildly  depressed appearing.  CNS: CN  2-12 intact, power,  normal throughout.no focal deficits noted.   Assessment & Plan  GAD (generalized anxiety disorder) Severe and uncontrolled, triggered by poor health of very close friend Start buspar and low dose SSRI, needs therapy , has been referred in the past  NICOTINE ADDICTION Asked:confirms currently smokes cigarettes Assess: Unwilling to quit but cutting back Advise: needs to QUIT to reduce risk of cancer, cardio and cerebrovascular disease Assist: counseled for 5 minutes and literature provided Arrange: follow up in 3 months   Depression with anxiety Start SSRI and hopefully will be able to engage with telepsych also  Hyperlipemia Hyperlipidemia:Low fat diet discussed and encouraged.   Lipid Panel  Lab Results  Component Value Date   CHOL 180 04/19/2018   HDL 57 04/19/2018   LDLCALC 104 (H) 04/19/2018   TRIG 97 04/19/2018   CHOLHDL 3.2 04/19/2018  fairly food control, no med change, needs to reduce fat intake     Need for 23-polyvalent pneumococcal polysaccharide vaccine After obtaining informed consent, the vaccine is  administered with no immediate complication

## 2018-12-05 NOTE — Assessment & Plan Note (Signed)
Start SSRI and hopefully will be able to engage with telepsych also

## 2018-12-05 NOTE — Assessment & Plan Note (Signed)
After obtaining informed consent, the vaccine is  administered with no immediate complication

## 2018-12-05 NOTE — Assessment & Plan Note (Signed)
Hyperlipidemia:Low fat diet discussed and encouraged.   Lipid Panel  Lab Results  Component Value Date   CHOL 180 04/19/2018   HDL 57 04/19/2018   LDLCALC 104 (H) 04/19/2018   TRIG 97 04/19/2018   CHOLHDL 3.2 04/19/2018  fairly food control, no med change, needs to reduce fat intake

## 2018-12-24 DIAGNOSIS — Z79899 Other long term (current) drug therapy: Secondary | ICD-10-CM | POA: Diagnosis not present

## 2018-12-24 DIAGNOSIS — G894 Chronic pain syndrome: Secondary | ICD-10-CM | POA: Diagnosis not present

## 2018-12-24 DIAGNOSIS — M25571 Pain in right ankle and joints of right foot: Secondary | ICD-10-CM | POA: Diagnosis not present

## 2018-12-24 DIAGNOSIS — G8929 Other chronic pain: Secondary | ICD-10-CM | POA: Diagnosis not present

## 2019-01-18 ENCOUNTER — Other Ambulatory Visit: Payer: Self-pay

## 2019-01-18 ENCOUNTER — Ambulatory Visit (INDEPENDENT_AMBULATORY_CARE_PROVIDER_SITE_OTHER): Payer: Medicare Other | Admitting: Family Medicine

## 2019-01-18 ENCOUNTER — Encounter: Payer: Self-pay | Admitting: Family Medicine

## 2019-01-18 VITALS — BP 128/62 | HR 74 | Resp 15 | Ht 63.0 in | Wt 128.0 lb

## 2019-01-18 DIAGNOSIS — F418 Other specified anxiety disorders: Secondary | ICD-10-CM | POA: Diagnosis not present

## 2019-01-18 DIAGNOSIS — I480 Paroxysmal atrial fibrillation: Secondary | ICD-10-CM | POA: Diagnosis not present

## 2019-01-18 DIAGNOSIS — I10 Essential (primary) hypertension: Secondary | ICD-10-CM | POA: Diagnosis not present

## 2019-01-18 DIAGNOSIS — D699 Hemorrhagic condition, unspecified: Secondary | ICD-10-CM

## 2019-01-18 DIAGNOSIS — I7410 Embolism and thrombosis of unspecified parts of aorta: Secondary | ICD-10-CM

## 2019-01-18 DIAGNOSIS — J42 Unspecified chronic bronchitis: Secondary | ICD-10-CM | POA: Diagnosis not present

## 2019-01-18 DIAGNOSIS — F17200 Nicotine dependence, unspecified, uncomplicated: Secondary | ICD-10-CM | POA: Diagnosis not present

## 2019-01-18 DIAGNOSIS — F17210 Nicotine dependence, cigarettes, uncomplicated: Secondary | ICD-10-CM | POA: Diagnosis not present

## 2019-01-18 DIAGNOSIS — I499 Cardiac arrhythmia, unspecified: Secondary | ICD-10-CM | POA: Diagnosis not present

## 2019-01-18 DIAGNOSIS — G894 Chronic pain syndrome: Secondary | ICD-10-CM

## 2019-01-18 DIAGNOSIS — C3491 Malignant neoplasm of unspecified part of right bronchus or lung: Secondary | ICD-10-CM | POA: Diagnosis not present

## 2019-01-18 NOTE — Patient Instructions (Addendum)
F/u in 4.5 months, call if you need me sooner  Please verify patient's appt with her Cardiologist at checkout / after , this is at [facility], she has the info   [name] , Please call Dr Worthy Flank office to get your appointment scheduled for the chest scan     Blood pressure today is normal, and not coinciding with your  meter.  Changing  To taking your blood pressure medication at night is likely to give you better control EKG in office today because of your concerns re skipped beats and increased fatigue,

## 2019-01-20 DIAGNOSIS — G629 Polyneuropathy, unspecified: Secondary | ICD-10-CM | POA: Diagnosis not present

## 2019-01-20 DIAGNOSIS — Z79899 Other long term (current) drug therapy: Secondary | ICD-10-CM | POA: Diagnosis not present

## 2019-01-20 DIAGNOSIS — I10 Essential (primary) hypertension: Secondary | ICD-10-CM | POA: Diagnosis not present

## 2019-01-20 DIAGNOSIS — G894 Chronic pain syndrome: Secondary | ICD-10-CM | POA: Diagnosis not present

## 2019-01-24 ENCOUNTER — Encounter: Payer: Self-pay | Admitting: Family Medicine

## 2019-01-24 NOTE — Assessment & Plan Note (Signed)
[place]

## 2019-01-24 NOTE — Assessment & Plan Note (Signed)
MARKED IMPROVEMENT ON CURRENT REGIME, CONTINUE SAME

## 2019-01-24 NOTE — Assessment & Plan Note (Signed)
Controlled, no change in medication DASH diet and commitment to daily physical activity for a minimum of 30 minutes discussed and encouraged, as a part of hypertension management. The importance of attaining a healthy weight is also discussed.  BP/Weight 01/18/2019 11/12/2018 11/11/2018 09/27/2018 08/16/2018 06/30/2018 9/35/7017  Systolic BP 793 903 009 233 007 622 633  Diastolic BP 62 83 70 72 70 70 71  Wt. (Lbs) 128 - 137 137 136.12 137 137  BMI 22.67 - 24.27 24.27 24.11 24.27 24.27

## 2019-01-24 NOTE — Progress Notes (Signed)
   [name]     MRN: [id number]      DOB: 1950/12/03   HPI [name] is here for follow up and re-evaluation of chronic medical conditions, medication management and review of any available recent lab and radiology data.  Preventive health is updated, specifically  Cancer screening and Immunization.   Questions or concerns regarding consultations or procedures which the PT has had in the interim are  addressed. The PT denies any adverse reactions to current medications since the last visit.  C/o irregular heart rate , fatigue and blood pressure being uncontrolled, has upcoming Cardiology appt Needs to schedule her chest scan prior to upcoming oncology appointment C/o bilateral hand pain, sees pain management  ROS Denies recent fever or chills. Denies sinus pressure, nasal congestion, ear pain or sore throat. Denies chest congestion, has chronic smoker's cough, which is unchanged, and intermittent  wheezing. Denies chest pains,PN , orthopnea  and leg swelling Denies abdominal pain, nausea, vomiting,diarrhea or constipation.   Denies dysuria, frequency, hesitancy or incontinence. C/o chronic  joint pain,  and limitation in mobility. Denies headaches, seizures, c/o chronic lower extremity numbness, and  tingling. Denies uncontrolled depression, anxiety or insomnia. Denies skin break down or rash.   PE  BP 128/62   Pulse 74   Resp 15   Ht 5\' 3"  (1.6 m)   Wt 128 lb (58.1 kg)   SpO2 96%   BMI 22.67 kg/m   Patient alert and oriented and in no cardiopulmonary distress.  HEENT: No facial asymmetry, EOMI,   oropharynx pink and moist.  Neck supple no JVD, no mass.  Chest: decreased air entry,  Scattered crackles and wheezes CVS: S1, S2 no murmurs, no S3.Regular rate.  ABD: Soft non tender.   Ext: No edema  MS: Adequate though reduced  ROM spine, shoulders, hips and knees.  Skin: Intact, no ulcerations or rash noted.  Psych: Good eye contact, normal affect. Memory intact  not anxious or depressed appearing.  CNS: CN 2-12 intact, power,  normal throughout.no focal deficits noted.   Assessment & Plan  Essential hypertension Controlled, no change in medication DASH diet and commitment to daily physical activity for a minimum of 30 minutes discussed and encouraged, as a part of hypertension management. The importance of attaining a healthy weight is also discussed.  BP/Weight 01/18/2019 11/12/2018 11/11/2018 09/27/2018 08/16/2018 06/30/2018 6/80/3212  Systolic BP 248 250 037 048 889 169 450  Diastolic BP 62 83 70 72 70 70 71  Wt. (Lbs) 128 - 137 137 136.12 137 137  BMI 22.67 - 24.27 24.27 24.11 24.27 24.27       Paroxysmal a-fib (HCC) EKG in office sinus bradycardia , rate of 54, no lV or ischemic changes  Depression with anxiety MARKED IMPROVEMENT ON CURRENT REGIME, CONTINUE SAME  Chronic pain syndrome MANAGED BY PAIN CLINIC  NICOTINE ADDICTION Asked:confirms currently smokes 20 cigarettes DAILY Assess: Unwilling to quit but cutting back Advise: needs to QUIT to reduce risk of cancer, cardio and cerebrovascular disease Assist: counseled for 5 minutes and literature provided Arrange: follow up in 3 months'

## 2019-01-24 NOTE — Assessment & Plan Note (Signed)
EKG in office sinus bradycardia , rate of 54, no lV or ischemic changes

## 2019-01-24 NOTE — Assessment & Plan Note (Signed)
Asked:confirms currently smokes 20 cigarettes DAILY Assess: Unwilling to quit but cutting back Advise: needs to QUIT to reduce risk of cancer, cardio and cerebrovascular disease Assist: counseled for 5 minutes and literature provided Arrange: follow up in 3 months'

## 2019-02-10 ENCOUNTER — Telehealth: Payer: Self-pay | Admitting: Internal Medicine

## 2019-02-10 ENCOUNTER — Other Ambulatory Visit: Payer: Self-pay | Admitting: Medical Oncology

## 2019-02-10 DIAGNOSIS — C3431 Malignant neoplasm of lower lobe, right bronchus or lung: Secondary | ICD-10-CM

## 2019-02-10 NOTE — Telephone Encounter (Signed)
[place]

## 2019-02-10 NOTE — Telephone Encounter (Signed)
Called patient regarding rescheduling appointments, per patient's request due to concerns over COVID-19 appointment moved from April to August.  Cancelled lab, patient requesting Lab will be done same day as CT scan at [facility].   Message to provider.

## 2019-02-17 DIAGNOSIS — G894 Chronic pain syndrome: Secondary | ICD-10-CM | POA: Diagnosis not present

## 2019-02-17 DIAGNOSIS — Z79899 Other long term (current) drug therapy: Secondary | ICD-10-CM | POA: Diagnosis not present

## 2019-02-17 DIAGNOSIS — G629 Polyneuropathy, unspecified: Secondary | ICD-10-CM | POA: Diagnosis not present

## 2019-02-28 ENCOUNTER — Emergency Department (HOSPITAL_COMMUNITY): Payer: Medicare Other

## 2019-02-28 ENCOUNTER — Emergency Department (HOSPITAL_COMMUNITY)
Admission: EM | Admit: 2019-02-28 | Discharge: 2019-03-01 | Disposition: A | Discharge status: Home / Self Care | Payer: Medicare Other | Attending: Emergency Medicine | Admitting: Emergency Medicine

## 2019-02-28 ENCOUNTER — Encounter (HOSPITAL_COMMUNITY): Payer: Self-pay | Admitting: Emergency Medicine

## 2019-02-28 ENCOUNTER — Telehealth: Payer: Medicare Other | Admitting: Family

## 2019-02-28 ENCOUNTER — Other Ambulatory Visit: Payer: Self-pay

## 2019-02-28 DIAGNOSIS — I2510 Atherosclerotic heart disease of native coronary artery without angina pectoris: Secondary | ICD-10-CM | POA: Insufficient documentation

## 2019-02-28 DIAGNOSIS — I10 Essential (primary) hypertension: Secondary | ICD-10-CM | POA: Diagnosis not present

## 2019-02-28 DIAGNOSIS — J449 Chronic obstructive pulmonary disease, unspecified: Secondary | ICD-10-CM | POA: Insufficient documentation

## 2019-02-28 DIAGNOSIS — R109 Unspecified abdominal pain: Secondary | ICD-10-CM

## 2019-02-28 DIAGNOSIS — R197 Diarrhea, unspecified: Secondary | ICD-10-CM | POA: Insufficient documentation

## 2019-02-28 DIAGNOSIS — R1032 Left lower quadrant pain: Secondary | ICD-10-CM | POA: Diagnosis not present

## 2019-02-28 DIAGNOSIS — F17210 Nicotine dependence, cigarettes, uncomplicated: Secondary | ICD-10-CM | POA: Insufficient documentation

## 2019-02-28 DIAGNOSIS — Z79899 Other long term (current) drug therapy: Secondary | ICD-10-CM | POA: Diagnosis not present

## 2019-02-28 DIAGNOSIS — Z7902 Long term (current) use of antithrombotics/antiplatelets: Secondary | ICD-10-CM | POA: Insufficient documentation

## 2019-02-28 DIAGNOSIS — K5730 Diverticulosis of large intestine without perforation or abscess without bleeding: Secondary | ICD-10-CM | POA: Diagnosis not present

## 2019-02-28 DIAGNOSIS — R0602 Shortness of breath: Secondary | ICD-10-CM | POA: Diagnosis not present

## 2019-02-28 DIAGNOSIS — Z79891 Long term (current) use of opiate analgesic: Secondary | ICD-10-CM | POA: Insufficient documentation

## 2019-02-28 DIAGNOSIS — A09 Infectious gastroenteritis and colitis, unspecified: Secondary | ICD-10-CM | POA: Diagnosis not present

## 2019-02-28 LAB — URINALYSIS, ROUTINE W REFLEX MICROSCOPIC
Bilirubin Urine: NEGATIVE
Glucose, UA: NEGATIVE mg/dL
Ketones, ur: NEGATIVE mg/dL
Leukocytes,Ua: NEGATIVE
Nitrite: NEGATIVE
Protein, ur: NEGATIVE mg/dL
Specific Gravity, Urine: 1.003 — ABNORMAL LOW (ref 1.005–1.030)
pH: 6 (ref 5.0–8.0)

## 2019-02-28 LAB — CBC WITH DIFFERENTIAL/PLATELET
Abs Immature Granulocytes: 0.08 10*3/uL — ABNORMAL HIGH (ref 0.00–0.07)
Basophils Absolute: 0 10*3/uL (ref 0.0–0.1)
Basophils Relative: 0 %
Eosinophils Absolute: 0.1 10*3/uL (ref 0.0–0.5)
Eosinophils Relative: 1 %
HCT: 44 % (ref 36.0–46.0)
Hemoglobin: 14.4 g/dL (ref 12.0–15.0)
Immature Granulocytes: 1 %
Lymphocytes Relative: 6 %
Lymphs Abs: 1 10*3/uL (ref 0.7–4.0)
MCH: 31.1 pg (ref 26.0–34.0)
MCHC: 32.7 g/dL (ref 30.0–36.0)
MCV: 95 fL (ref 80.0–100.0)
Monocytes Absolute: 1.1 10*3/uL — ABNORMAL HIGH (ref 0.1–1.0)
Monocytes Relative: 6 %
Neutro Abs: 15.3 10*3/uL — ABNORMAL HIGH (ref 1.7–7.7)
Neutrophils Relative %: 86 %
Platelets: 229 10*3/uL (ref 150–400)
RBC: 4.63 MIL/uL (ref 3.87–5.11)
RDW: 13.4 % (ref 11.5–15.5)
WBC: 17.6 10*3/uL — ABNORMAL HIGH (ref 4.0–10.5)
nRBC: 0 % (ref 0.0–0.2)

## 2019-02-28 LAB — COMPREHENSIVE METABOLIC PANEL
ALT: 17 U/L (ref 0–44)
AST: 23 U/L (ref 15–41)
Albumin: 4.7 g/dL (ref 3.5–5.0)
Alkaline Phosphatase: 62 U/L (ref 38–126)
Anion gap: 11 (ref 5–15)
BUN: 10 mg/dL (ref 8–23)
CO2: 25 mmol/L (ref 22–32)
Calcium: 9.3 mg/dL (ref 8.9–10.3)
Chloride: 101 mmol/L (ref 98–111)
Creatinine, Ser: 0.61 mg/dL (ref 0.44–1.00)
GFR calc Af Amer: >60 mL/min (ref >60)
GFR calc non Af Amer: >60 mL/min (ref >60)
Glucose, Bld: 102 mg/dL — ABNORMAL HIGH (ref 70–99)
Potassium: 3.5 mmol/L (ref 3.5–5.1)
Sodium: 137 mmol/L (ref 135–145)
Total Bilirubin: 0.7 mg/dL (ref 0.3–1.2)
Total Protein: 7.6 g/dL (ref 6.5–8.1)

## 2019-02-28 LAB — LIPASE, BLOOD: Lipase: 22 U/L (ref 11–51)

## 2019-02-28 MED ORDER — IOHEXOL 300 MG/ML  SOLN
100.0000 mL | Freq: Once | INTRAMUSCULAR | Status: AC | PRN
  Administered 2019-02-28: 100 mL via INTRAVENOUS

## 2019-02-28 MED ORDER — SODIUM CHLORIDE 0.9 % IV BOLUS (SEPSIS)
1000.0000 mL | Freq: Once | INTRAVENOUS | Status: AC
  Administered 2019-02-28: 1000 mL via INTRAVENOUS

## 2019-02-28 MED ORDER — SODIUM CHLORIDE 0.9 % IV SOLN
1000.0000 mL | INTRAVENOUS | Status: DC
  Administered 2019-02-28: 23:00:00 1000 mL via INTRAVENOUS

## 2019-02-28 NOTE — Progress Notes (Signed)
Based on what you shared with me, I feel your condition warrants further evaluation and I recommend that you be seen for a face to face office visit.  Given your current symptoms, you need to be seen right now.    NOTE: If you entered your credit card information for this eVisit, you will not be charged. You may see a "hold" on your card for the $35 but that hold will drop off and you will not have a charge processed.  If you are having a true medical emergency please call 911.  If you need an urgent face to face visit, [place] has four urgent care centers for your convenience.    PLEASE NOTE: THE [facility]STACARE LOCATIONS AND URGENT CARE CLINICS DO NOT HAVE THE TESTING FOR CORONAVIRUS COVID19 AVAILABLE.  IF YOU FEEL YOU NEED THIS TEST YOU MUST GO TO A TRIAGE LOCATION AT [place]   [email or web address] to reserve your spot online an avoid wait times  [facility] [address] [place], [place] [address] Modified hours of operation: Monday-Friday, 10 AM to 6 PM  Saturday & Sunday 10 AM to 4 PM *Across the street from [place] (New Address!) [address] [place], [place] [address] *Just off [address], across the road from [place] hours of operation: Monday-Friday, 10 AM to 5 PM  Closed Saturday & Sunday   The following sites will take your insurance:  . [facility] [place] a Provider at this Location  [address] [place], [place] [address] . 10 am to 8 pm Monday-Friday . 12 pm to 8 pm Saturday-Sunday   . [facility] at [place] a Provider at this Location  [address], [place], [place] [address] . 8 am to 8 pm Monday-Friday . 9 am to 6 pm Saturday . 11 am to 6 pm Sunday   . [facility] at  [place] Get Driving Directions  [address] [place], [place] [address] . 8 am to 8 pm Monday-Friday . 8 am to 4 pm Saturday-Sunday   Your e-visit answers were reviewed by a board certified advanced clinical practitioner to complete your personal care plan.  Thank you for using e-Visits.

## 2019-02-28 NOTE — ED Triage Notes (Signed)
Patient complains of diarrhea times 3 days, denies N/V. She has a headache that began at 1600 today. She also complains of shortness of breathe, chills, and says her temp was 100 at 1845. Patient completed an e visit with [facility] and was sent to the ED.
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
## 2019-03-01 DIAGNOSIS — K5730 Diverticulosis of large intestine without perforation or abscess without bleeding: Secondary | ICD-10-CM | POA: Diagnosis not present

## 2019-03-01 DIAGNOSIS — R1032 Left lower quadrant pain: Secondary | ICD-10-CM | POA: Diagnosis not present

## 2019-03-01 MED ORDER — AMOXICILLIN-POT CLAVULANATE 875-125 MG PO TABS
1.0000 | ORAL_TABLET | Freq: Once | ORAL | Status: AC
  Administered 2019-03-01: 01:00:00 1 via ORAL
  Filled 2019-03-01: qty 1

## 2019-03-01 MED ORDER — AMOXICILLIN-POT CLAVULANATE 875-125 MG PO TABS: 1.0000 | ORAL_TABLET | Freq: Two times a day (BID) | ORAL | 0 refills | Status: DC

## 2019-03-01 NOTE — ED Provider Notes (Signed)
I assumed care at signout to follow-up with CT scan. CT does not reveal any acute findings.  However patient is insistent that this is like her diverticulitis Patient was febrile with an elevated white count.  She reports continued LLQ abdominal pain. We will start oral antibiotics in case this is early diverticulitis. She is in no distress, resting comfortably.  She is requesting discharge home She already  has pain medicine at home We discussed strict return precautions   Ripley Fraise, MD 03/01/19 [phone]

## 2019-03-04 ENCOUNTER — Ambulatory Visit (HOSPITAL_COMMUNITY): Admission: RE | Admit: 2019-03-04 | Payer: Medicare Other | Source: Ambulatory Visit

## 2019-03-04 ENCOUNTER — Other Ambulatory Visit: Payer: Medicare Other

## 2019-03-07 ENCOUNTER — Other Ambulatory Visit (HOSPITAL_COMMUNITY): Payer: Self-pay | Admitting: Gastroenterology

## 2019-03-07 ENCOUNTER — Other Ambulatory Visit: Payer: Self-pay | Admitting: Gastroenterology

## 2019-03-07 DIAGNOSIS — R1032 Left lower quadrant pain: Secondary | ICD-10-CM

## 2019-03-07 DIAGNOSIS — R197 Diarrhea, unspecified: Secondary | ICD-10-CM | POA: Diagnosis not present

## 2019-03-07 DIAGNOSIS — D72829 Elevated white blood cell count, unspecified: Secondary | ICD-10-CM | POA: Diagnosis not present

## 2019-03-10 ENCOUNTER — Ambulatory Visit: Payer: Medicare Other | Admitting: Internal Medicine

## 2019-03-11 ENCOUNTER — Ambulatory Visit: Admission: RE | Admit: 2019-03-11 | Payer: Medicare Other | Source: Ambulatory Visit

## 2019-03-16 ENCOUNTER — Encounter: Payer: Self-pay | Admitting: *Deleted

## 2019-03-17 DIAGNOSIS — F17210 Nicotine dependence, cigarettes, uncomplicated: Secondary | ICD-10-CM | POA: Diagnosis not present

## 2019-03-17 DIAGNOSIS — M25571 Pain in right ankle and joints of right foot: Secondary | ICD-10-CM | POA: Diagnosis not present

## 2019-03-17 DIAGNOSIS — G629 Polyneuropathy, unspecified: Secondary | ICD-10-CM | POA: Diagnosis not present

## 2019-03-17 DIAGNOSIS — G8929 Other chronic pain: Secondary | ICD-10-CM | POA: Diagnosis not present

## 2019-04-06 ENCOUNTER — Ambulatory Visit (INDEPENDENT_AMBULATORY_CARE_PROVIDER_SITE_OTHER): Payer: Medicare Other | Admitting: Family Medicine

## 2019-04-06 ENCOUNTER — Other Ambulatory Visit: Payer: Self-pay

## 2019-04-06 ENCOUNTER — Encounter: Payer: Self-pay | Admitting: Family Medicine

## 2019-04-06 VITALS — BP 120/72 | HR 55 | Resp 15 | Ht 63.0 in | Wt 129.8 lb

## 2019-04-06 DIAGNOSIS — Z1231 Encounter for screening mammogram for malignant neoplasm of breast: Secondary | ICD-10-CM | POA: Diagnosis not present

## 2019-04-06 DIAGNOSIS — F17200 Nicotine dependence, unspecified, uncomplicated: Secondary | ICD-10-CM

## 2019-04-06 DIAGNOSIS — Z7189 Other specified counseling: Secondary | ICD-10-CM | POA: Diagnosis not present

## 2019-04-06 DIAGNOSIS — I7410 Embolism and thrombosis of unspecified parts of aorta: Secondary | ICD-10-CM | POA: Diagnosis not present

## 2019-04-06 DIAGNOSIS — N3000 Acute cystitis without hematuria: Secondary | ICD-10-CM | POA: Insufficient documentation

## 2019-04-06 DIAGNOSIS — I10 Essential (primary) hypertension: Secondary | ICD-10-CM

## 2019-04-06 DIAGNOSIS — E785 Hyperlipidemia, unspecified: Secondary | ICD-10-CM | POA: Diagnosis not present

## 2019-04-06 DIAGNOSIS — N3001 Acute cystitis with hematuria: Secondary | ICD-10-CM | POA: Diagnosis not present

## 2019-04-06 DIAGNOSIS — F17210 Nicotine dependence, cigarettes, uncomplicated: Secondary | ICD-10-CM | POA: Diagnosis not present

## 2019-04-06 LAB — POCT URINALYSIS DIP (CLINITEK)
Bilirubin, UA: NEGATIVE
Glucose, UA: NEGATIVE mg/dL
Ketones, POC UA: NEGATIVE mg/dL
Nitrite, UA: NEGATIVE
POC PROTEIN,UA: NEGATIVE
Spec Grav, UA: 1.02 (ref 1.010–1.025)
Urobilinogen, UA: 0.2 E.U./dL
pH, UA: 6 (ref 5.0–8.0)

## 2019-04-06 MED ORDER — TRAZODONE HCL 50 MG PO TABS: 25.0000 mg | ORAL_TABLET | Freq: Every evening | ORAL | 3 refills | Status: DC | PRN

## 2019-04-06 MED ORDER — CEPHALEXIN 500 MG PO CAPS: 500.0000 mg | ORAL_CAPSULE | Freq: Three times a day (TID) | ORAL | 0 refills | Status: DC

## 2019-04-06 NOTE — Patient Instructions (Addendum)
F/U  Early Septemeber,cancel sooner appointment call if you need me sooner  Keflex is prescribed for your bladder infection  Drink at least 64 ounces water daily and empty often  Please continue to keep active, and keep safe  We will schedule your mammogram for mid to end August  Continue to cut back on cigarettes, quitting is the goal!    Urinary Tract Infection, Adult A urinary tract infection (UTI) is an infection of any part of the urinary tract. The urinary tract includes:  The kidneys.  The ureters.  The bladder.  The urethra. These organs make, store, and get rid of pee (urine) in the body. What are the causes? This is caused by germs (bacteria) in your genital area. These germs grow and cause swelling (inflammation) of your urinary tract. What increases the risk? You are more likely to develop this condition if:  You have a small, thin tube (catheter) to drain pee.  You cannot control when you pee or poop (incontinence).  You are female, and: ? You use these methods to prevent pregnancy: ? A medicine that kills sperm (spermicide). ? A device that blocks sperm (diaphragm). ? You have low levels of a female hormone (estrogen). ? You are pregnant.  You have genes that add to your risk.  You are sexually active.  You take antibiotic medicines.  You have trouble peeing because of: ? A prostate that is bigger than normal, if you are female. ? A blockage in the part of your body that drains pee from the bladder (urethra). ? A kidney stone. ? A nerve condition that affects your bladder (neurogenic bladder). ? Not getting enough to drink. ? Not peeing often enough.  You have other conditions, such as: ? Diabetes. ? A weak disease-fighting system (immune system). ? Sickle cell disease. ? Gout. ? Injury of the spine. What are the signs or symptoms? Symptoms of this condition include:  Needing to pee right away (urgently).  Peeing often.  Peeing small  amounts often.  Pain or burning when peeing.  Blood in the pee.  Pee that smells bad or not like normal.  Trouble peeing.  Pee that is cloudy.  Fluid coming from the vagina, if you are female.  Pain in the belly or lower back. Other symptoms include:  Throwing up (vomiting).  No urge to eat.  Feeling mixed up (confused).  Being tired and grouchy (irritable).  A fever.  Watery poop (diarrhea). How is this treated? This condition may be treated with:  Antibiotic medicine.  Other medicines.  Drinking enough water. Follow these instructions at home:  Medicines  Take over-the-counter and prescription medicines only as told by your doctor.  If you were prescribed an antibiotic medicine, take it as told by your doctor. Do not stop taking it even if you start to feel better. General instructions  Make sure you: ? Pee until your bladder is empty. ? Do not hold pee for a long time. ? Empty your bladder after sex. ? Wipe from front to back after pooping if you are a female. Use each tissue one time when you wipe.  Drink enough fluid to keep your pee pale yellow.  Keep all follow-up visits as told by your doctor. This is important. Contact a doctor if:  You do not get better after 1-2 days.  Your symptoms go away and then come back. Get help right away if:  You have very bad back pain.  You have very bad pain  in your lower belly.  You have a fever.  You are sick to your stomach (nauseous).  You are throwing up. Summary  A urinary tract infection (UTI) is an infection of any part of the urinary tract.  This condition is caused by germs in your genital area.  There are many risk factors for a UTI. These include having a small, thin tube to drain pee and not being able to control when you pee or poop.  Treatment includes antibiotic medicines for germs.  Drink enough fluid to keep your pee pale yellow. This information is not intended to replace  advice given to you by your health care provider. Make sure you discuss any questions you have with your health care provider. Document Released: 04/14/2008 Document Revised: 05/06/2018 Document Reviewed: 05/06/2018 Elsevier Interactive Patient Education  2019 [employer].

## 2019-04-06 NOTE — Progress Notes (Signed)
[name]     MRN: [id number]      DOB: 12/04/1950   HPI [name] is here for follow up and re-evaluation of chronic medical conditions, medication management and review of any available recent lab and radiology data.  Preventive health is updated, specifically  Cancer screening and Immunization.   Questions or concerns regarding consultations or procedures which the PT has had in the interim are  addressed. The PT denies any adverse reactions to current medications since the last visit.  3 day h/o freuqency and burning, no fever, chills or flank pain   ROS Denies recent fever or chills. Denies sinus pressure, nasal congestion, ear pain or sore throat. Denies chest congestion, productive cough or wheezing. Denies chest pains, palpitations and leg swelling Denies abdominal pain, nausea, vomiting,diarrhea or constipation.   Denies uncontrolled joint pain, swelling and limitation in mobility. Denies headaches, seizures, numbness, or tingling. Denies uncontrolled depression, anxiety or insomnia. Denies skin break down or rash.   PE  BP 140/72   Pulse (!) 55   Resp 15   Ht 5\' 3"  (1.6 m)   Wt 129 lb 12.8 oz (58.9 kg)   SpO2 94%   BMI 22.99 kg/m   Patient alert and oriented and in no cardiopulmonary distress.  HEENT: No facial asymmetry, EOMI,   oropharynx pink and moist.  Neck supple no JVD, no mass.  Chest: Clear to auscultation bilaterally.Decreased air entry bilterally  CVS: S1, S2 no murmurs, no S3.Regular rate.  ABD: Soft non tender. No renal angle tenderness  Ext: No edema  MS: Adequate though reduced  ROM spine, shoulders, hips and knees.  Skin: Intact, no ulcerations or rash noted.  Psych: Good eye contact, normal affect. Memory intact not anxious or depressed appearing.  CNS: CN 2-12 intact, power,  normal throughout.no focal deficits noted.   Assessment & Plan  Acute cystitis Symptomatic x 3 days, send for c/S , keflex prescribed, increased water  intake and frequent voiding also dscussed  Hyperlipemia Hyperlipidemia:Low fat diet discussed and encouraged.   Lipid Panel  Lab Results  Component Value Date   CHOL 180 04/19/2018   HDL 57 04/19/2018   LDLCALC 104 (H) 04/19/2018   TRIG 97 04/19/2018   CHOLHDL 3.2 04/19/2018   Updated lab needed at/ before next visit. Needs to reduce fried and fatty foods    NICOTINE ADDICTION Asked:confirms currently smokes cigarettes Assess: Unwilling to quit but cutting back Advise: needs to QUIT to reduce risk of cancer, cardio and cerebrovascular disease Assist: counseled for 5 minutes and literature provided Arrange: follow up in 3 months   Essential hypertension Uncontrolled at visit, nomed change made as generally at goal, will re evaluate  DASH diet and commitment to daily physical activity for a minimum of 30 minutes discussed and encouraged, as a part of hypertension management. The importance of attaining a healthy weight is also discussed.  BP/Weight 04/06/2019 03/01/2019 02/28/2019 01/18/2019 11/12/2018 11/11/2018 40/98/1191  Systolic BP 478 295 - 621 308 657 846  Diastolic BP 72 70 - 62 83 70 72  Wt. (Lbs) 129.8 - 128 128 - 137 137  BMI 22.99 - 22.67 22.67 - 24.27 24.27       Educated About Covid-19 Virus Infection Covid-19 Education  The signs and symptoms of of COVID -19 were discussed with the patient and how to seek care for testing. ( follow up with PCP or arrange  E-visit) The importance of social  distancing is discussed today.

## 2019-04-07 ENCOUNTER — Other Ambulatory Visit (HOSPITAL_COMMUNITY)
Admission: RE | Admit: 2019-04-07 | Discharge: 2019-04-07 | Disposition: A | Discharge status: Home / Self Care | Payer: Medicare Other | Source: Ambulatory Visit | Attending: Family Medicine | Admitting: Family Medicine

## 2019-04-07 DIAGNOSIS — N3001 Acute cystitis with hematuria: Secondary | ICD-10-CM | POA: Insufficient documentation

## 2019-04-08 LAB — URINE CULTURE

## 2019-04-10 ENCOUNTER — Encounter: Payer: Self-pay | Admitting: Family Medicine

## 2019-04-10 DIAGNOSIS — Z7189 Other specified counseling: Secondary | ICD-10-CM | POA: Insufficient documentation

## 2019-04-10 NOTE — Assessment & Plan Note (Signed)
Hyperlipidemia:Low fat diet discussed and encouraged.   Lipid Panel  Lab Results  Component Value Date   CHOL 180 04/19/2018   HDL 57 04/19/2018   LDLCALC 104 (H) 04/19/2018   TRIG 97 04/19/2018   CHOLHDL 3.2 04/19/2018   Updated lab needed at/ before next visit. Needs to reduce fried and fatty foods

## 2019-04-10 NOTE — Assessment & Plan Note (Signed)
Uncontrolled at visit, nomed change made as generally at goal, will re evaluate  DASH diet and commitment to daily physical activity for a minimum of 30 minutes discussed and encouraged, as a part of hypertension management. The importance of attaining a healthy weight is also discussed.  BP/Weight 04/06/2019 03/01/2019 02/28/2019 01/18/2019 11/12/2018 11/11/2018 50/15/8682  Systolic BP 574 935 - 521 747 159 539  Diastolic BP 72 70 - 62 83 70 72  Wt. (Lbs) 129.8 - 128 128 - 137 137  BMI 22.99 - 22.67 22.67 - 24.27 24.27

## 2019-04-10 NOTE — Assessment & Plan Note (Signed)
Asked:confirms currently smokes cigarettes Assess: Unwilling to quit but cutting back Advise: needs to QUIT to reduce risk of cancer, cardio and cerebrovascular disease Assist: counseled for 5 minutes and literature provided Arrange: follow up in 3 months  

## 2019-04-10 NOTE — Assessment & Plan Note (Signed)
Symptomatic x 3 days, send for c/S , keflex prescribed, increased water intake and frequent voiding also dscussed

## 2019-04-10 NOTE — Assessment & Plan Note (Signed)
Covid-19 Education  The signs and symptoms of of COVID -19 were discussed with the patient and how to seek care for testing. ( follow up with PCP or arrange  E-visit) The importance of social  distancing is discussed today.  

## 2019-04-19 DIAGNOSIS — Z79899 Other long term (current) drug therapy: Secondary | ICD-10-CM | POA: Diagnosis not present

## 2019-04-19 DIAGNOSIS — G894 Chronic pain syndrome: Secondary | ICD-10-CM | POA: Diagnosis not present

## 2019-04-19 DIAGNOSIS — G629 Polyneuropathy, unspecified: Secondary | ICD-10-CM | POA: Diagnosis not present

## 2019-04-19 DIAGNOSIS — G8929 Other chronic pain: Secondary | ICD-10-CM | POA: Diagnosis not present

## 2019-04-19 DIAGNOSIS — M25571 Pain in right ankle and joints of right foot: Secondary | ICD-10-CM | POA: Diagnosis not present

## 2019-05-17 DIAGNOSIS — K5909 Other constipation: Secondary | ICD-10-CM | POA: Diagnosis not present

## 2019-05-17 DIAGNOSIS — K5790 Diverticulosis of intestine, part unspecified, without perforation or abscess without bleeding: Secondary | ICD-10-CM | POA: Diagnosis not present

## 2019-05-17 DIAGNOSIS — K219 Gastro-esophageal reflux disease without esophagitis: Secondary | ICD-10-CM | POA: Diagnosis not present

## 2019-05-18 DIAGNOSIS — M19071 Primary osteoarthritis, right ankle and foot: Secondary | ICD-10-CM | POA: Diagnosis not present

## 2019-05-18 DIAGNOSIS — M25571 Pain in right ankle and joints of right foot: Secondary | ICD-10-CM | POA: Diagnosis not present

## 2019-05-18 DIAGNOSIS — Z79899 Other long term (current) drug therapy: Secondary | ICD-10-CM | POA: Diagnosis not present

## 2019-05-18 DIAGNOSIS — G8929 Other chronic pain: Secondary | ICD-10-CM | POA: Diagnosis not present

## 2019-05-18 DIAGNOSIS — G894 Chronic pain syndrome: Secondary | ICD-10-CM | POA: Diagnosis not present

## 2019-06-08 ENCOUNTER — Ambulatory Visit: Payer: Medicare Other | Admitting: Family Medicine

## 2019-06-13 ENCOUNTER — Other Ambulatory Visit: Payer: Self-pay

## 2019-06-13 ENCOUNTER — Other Ambulatory Visit (HOSPITAL_COMMUNITY)
Admission: RE | Admit: 2019-06-13 | Discharge: 2019-06-13 | Disposition: A | Discharge status: Home / Self Care | Payer: Medicare Other | Source: Ambulatory Visit | Attending: Internal Medicine | Admitting: Internal Medicine

## 2019-06-13 ENCOUNTER — Ambulatory Visit (HOSPITAL_COMMUNITY)
Admission: RE | Admit: 2019-06-13 | Discharge: 2019-06-13 | Disposition: A | Discharge status: Home / Self Care | Payer: Medicare Other | Source: Ambulatory Visit | Attending: Internal Medicine | Admitting: Internal Medicine

## 2019-06-13 DIAGNOSIS — C3431 Malignant neoplasm of lower lobe, right bronchus or lung: Secondary | ICD-10-CM | POA: Diagnosis not present

## 2019-06-13 LAB — POCT I-STAT CREATININE: Creatinine, Ser: 0.7 mg/dL (ref 0.44–1.00)

## 2019-06-13 LAB — CBC WITH DIFFERENTIAL/PLATELET
Abs Immature Granulocytes: 0.05 10*3/uL (ref 0.00–0.07)
Basophils Absolute: 0.1 10*3/uL (ref 0.0–0.1)
Basophils Relative: 1 %
Eosinophils Absolute: 0.2 10*3/uL (ref 0.0–0.5)
Eosinophils Relative: 2 %
HCT: 44.5 % (ref 36.0–46.0)
Hemoglobin: 14.2 g/dL (ref 12.0–15.0)
Immature Granulocytes: 1 %
Lymphocytes Relative: 19 %
Lymphs Abs: 1.8 10*3/uL (ref 0.7–4.0)
MCH: 30.7 pg (ref 26.0–34.0)
MCHC: 31.9 g/dL (ref 30.0–36.0)
MCV: 96.3 fL (ref 80.0–100.0)
Monocytes Absolute: 0.9 10*3/uL (ref 0.1–1.0)
Monocytes Relative: 9 %
Neutro Abs: 6.8 10*3/uL (ref 1.7–7.7)
Neutrophils Relative %: 68 %
Platelets: 252 10*3/uL (ref 150–400)
RBC: 4.62 MIL/uL (ref 3.87–5.11)
RDW: 13.2 % (ref 11.5–15.5)
WBC: 9.8 10*3/uL (ref 4.0–10.5)
nRBC: 0 % (ref 0.0–0.2)

## 2019-06-13 LAB — COMPREHENSIVE METABOLIC PANEL
ALT: 17 U/L (ref 0–44)
AST: 20 U/L (ref 15–41)
Albumin: 4.5 g/dL (ref 3.5–5.0)
Alkaline Phosphatase: 54 U/L (ref 38–126)
Anion gap: 10 (ref 5–15)
BUN: 16 mg/dL (ref 8–23)
CO2: 27 mmol/L (ref 22–32)
Calcium: 9.5 mg/dL (ref 8.9–10.3)
Chloride: 103 mmol/L (ref 98–111)
Creatinine, Ser: 0.59 mg/dL (ref 0.44–1.00)
GFR calc Af Amer: >60 mL/min (ref >60)
GFR calc non Af Amer: >60 mL/min (ref >60)
Glucose, Bld: 99 mg/dL (ref 70–99)
Potassium: 4.2 mmol/L (ref 3.5–5.1)
Sodium: 140 mmol/L (ref 135–145)
Total Bilirubin: 0.2 mg/dL — ABNORMAL LOW (ref 0.3–1.2)
Total Protein: 7 g/dL (ref 6.5–8.1)

## 2019-06-13 MED ORDER — IOHEXOL 300 MG/ML  SOLN: 100.0000 mL | Freq: Once | INTRAMUSCULAR | Status: DC | PRN

## 2019-06-13 MED ORDER — IOHEXOL 300 MG/ML  SOLN
75.0000 mL | Freq: Once | INTRAMUSCULAR | Status: AC | PRN
  Administered 2019-06-13: 13:00:00 75 mL via INTRAVENOUS

## 2019-06-16 ENCOUNTER — Telehealth: Payer: Self-pay | Admitting: Internal Medicine

## 2019-06-16 ENCOUNTER — Encounter: Payer: Self-pay | Admitting: Internal Medicine

## 2019-06-16 ENCOUNTER — Other Ambulatory Visit: Payer: Self-pay

## 2019-06-16 ENCOUNTER — Inpatient Hospital Stay: Payer: Medicare Other | Attending: Internal Medicine | Admitting: Internal Medicine

## 2019-06-16 VITALS — BP 139/57 | HR 70 | Temp 98.7°F | Resp 18 | Ht 63.0 in | Wt 124.8 lb

## 2019-06-16 DIAGNOSIS — I2510 Atherosclerotic heart disease of native coronary artery without angina pectoris: Secondary | ICD-10-CM | POA: Diagnosis not present

## 2019-06-16 DIAGNOSIS — Z85118 Personal history of other malignant neoplasm of bronchus and lung: Secondary | ICD-10-CM | POA: Diagnosis not present

## 2019-06-16 DIAGNOSIS — C3431 Malignant neoplasm of lower lobe, right bronchus or lung: Secondary | ICD-10-CM | POA: Diagnosis not present

## 2019-06-16 DIAGNOSIS — I7410 Embolism and thrombosis of unspecified parts of aorta: Secondary | ICD-10-CM | POA: Diagnosis not present

## 2019-06-16 DIAGNOSIS — Z902 Acquired absence of lung [part of]: Secondary | ICD-10-CM | POA: Diagnosis not present

## 2019-06-16 DIAGNOSIS — C3490 Malignant neoplasm of unspecified part of unspecified bronchus or lung: Secondary | ICD-10-CM | POA: Diagnosis not present

## 2019-06-16 DIAGNOSIS — Z79899 Other long term (current) drug therapy: Secondary | ICD-10-CM | POA: Diagnosis not present

## 2019-06-16 DIAGNOSIS — I10 Essential (primary) hypertension: Secondary | ICD-10-CM | POA: Diagnosis not present

## 2019-06-16 DIAGNOSIS — B1920 Unspecified viral hepatitis C without hepatic coma: Secondary | ICD-10-CM | POA: Insufficient documentation
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
## 2019-06-16 NOTE — Patient Instructions (Signed)
Steps to Quit Smoking Smoking tobacco is the leading cause of preventable death. It can affect almost every organ in the body. Smoking puts you and people around you at risk for many serious, long-lasting (chronic) diseases. Quitting smoking can be hard, but it is one of the best things that you can do for your health. It is never too late to quit. How do I get ready to quit? When you decide to quit smoking, make a plan to help you succeed. Before you quit:  Pick a date to quit. Set a date within the next 2 weeks to give you time to prepare.  Write down the reasons why you are quitting. Keep this list in places where you will see it often.  Tell your family, friends, and co-workers that you are quitting. Their support is important.  Talk with your doctor about the choices that may help you quit.  Find out if your health insurance will pay for these treatments.  Know the people, places, things, and activities that make you want to smoke (triggers). Avoid them. What first steps can I take to quit smoking?  Throw away all cigarettes at home, at work, and in your car.  Throw away the things that you use when you smoke, such as ashtrays and lighters.  Clean your car. Make sure to empty the ashtray.  Clean your home, including curtains and carpets. What can I do to help me quit smoking? Talk with your doctor about taking medicines and seeing a counselor at the same time. You are more likely to succeed when you do both.  If you are pregnant or breastfeeding, talk with your doctor about counseling or other ways to quit smoking. Do not take medicine to help you quit smoking unless your doctor tells you to do so. To quit smoking: Quit right away  Quit smoking totally, instead of slowly cutting back on how much you smoke over a period of time.  Go to counseling. You are more likely to quit if you go to counseling sessions regularly. Take medicine You may take medicines to help you quit. Some  medicines need a prescription, and some you can buy over-the-counter. Some medicines may contain a drug called nicotine to replace the nicotine in cigarettes. Medicines may:  Help you to stop having the desire to smoke (cravings).  Help to stop the problems that come when you stop smoking (withdrawal symptoms). Your doctor may ask you to use:  Nicotine patches, gum, or lozenges.  Nicotine inhalers or sprays.  Non-nicotine medicine that is taken by mouth. Find resources Find resources and other ways to help you quit smoking and remain smoke-free after you quit. These resources are most helpful when you use them often. They include:  Online chats with a counselor.  Phone quitlines.  Printed self-help materials.  Support groups or group counseling.  Text messaging programs.  Mobile phone apps. Use apps on your mobile phone or tablet that can help you stick to your quit plan. There are many free apps for mobile phones and tablets as well as websites. Examples include Quit Guide from the CDC and smokefree.gov  What things can I do to make it easier to quit?   Talk to your family and friends. Ask them to support and encourage you.  Call a phone quitline (1-800-QUIT-NOW), reach out to support groups, or work with a counselor.  Ask people who smoke to not smoke around you.  Avoid places that make you want to smoke,   such as: ? Bars. ? Parties. ? Smoke-break areas at work.  Spend time with people who do not smoke.  Lower the stress in your life. Stress can make you want to smoke. Try these things to help your stress: ? Getting regular exercise. ? Doing deep-breathing exercises. ? Doing yoga. ? Meditating. ? Doing a body scan. To do this, close your eyes, focus on one area of your body at a time from head to toe. Notice which parts of your body are tense. Try to relax the muscles in those areas. How will I feel when I quit smoking? Day 1 to 3 weeks Within the first 24 hours,  you may start to have some problems that come from quitting tobacco. These problems are very bad 2-3 days after you quit, but they do not often last for more than 2-3 weeks. You may get these symptoms:  Mood swings.  Feeling restless, nervous, angry, or annoyed.  Trouble concentrating.  Dizziness.  Strong desire for high-sugar foods and nicotine.  Weight gain.  Trouble pooping (constipation).  Feeling like you may vomit (nausea).  Coughing or a sore throat.  Changes in how the medicines that you take for other issues work in your body.  Depression.  Trouble sleeping (insomnia). Week 3 and afterward After the first 2-3 weeks of quitting, you may start to notice more positive results, such as:  Better sense of smell and taste.  Less coughing and sore throat.  Slower heart rate.  Lower blood pressure.  Clearer skin.  Better breathing.  Fewer sick days. Quitting smoking can be hard. Do not give up if you fail the first time. Some people need to try a few times before they succeed. Do your best to stick to your quit plan, and talk with your doctor if you have any questions or concerns. Summary  Smoking tobacco is the leading cause of preventable death. Quitting smoking can be hard, but it is one of the best things that you can do for your health.  When you decide to quit smoking, make a plan to help you succeed.  Quit smoking right away, not slowly over a period of time.  When you start quitting, seek help from your doctor, family, or friends. This information is not intended to replace advice given to you by your health care provider. Make sure you discuss any questions you have with your health care provider. Document Released: 08/23/2009 Document Revised: 01/14/2019 Document Reviewed: 01/15/2019 Elsevier Patient Education  2020 [employer] Inc.  

## 2019-06-16 NOTE — Telephone Encounter (Signed)
Gave avs and calendar. No lab scheduled. Patient wants labs and ct done at [facility]

## 2019-06-17 DIAGNOSIS — M25571 Pain in right ankle and joints of right foot: Secondary | ICD-10-CM | POA: Diagnosis not present

## 2019-06-17 DIAGNOSIS — Z79899 Other long term (current) drug therapy: Secondary | ICD-10-CM | POA: Diagnosis not present

## 2019-06-17 DIAGNOSIS — Z760 Encounter for issue of repeat prescription: Secondary | ICD-10-CM | POA: Diagnosis not present

## 2019-06-17 DIAGNOSIS — G8929 Other chronic pain: Secondary | ICD-10-CM | POA: Diagnosis not present

## 2019-07-08 ENCOUNTER — Ambulatory Visit (HOSPITAL_COMMUNITY): Payer: Medicare Other

## 2019-07-15 DIAGNOSIS — Z79899 Other long term (current) drug therapy: Secondary | ICD-10-CM | POA: Diagnosis not present

## 2019-07-15 DIAGNOSIS — F17210 Nicotine dependence, cigarettes, uncomplicated: Secondary | ICD-10-CM | POA: Diagnosis not present

## 2019-07-15 DIAGNOSIS — M25571 Pain in right ankle and joints of right foot: Secondary | ICD-10-CM | POA: Diagnosis not present

## 2019-07-15 DIAGNOSIS — G8929 Other chronic pain: Secondary | ICD-10-CM | POA: Diagnosis not present

## 2019-07-15 DIAGNOSIS — M19071 Primary osteoarthritis, right ankle and foot: Secondary | ICD-10-CM | POA: Diagnosis not present

## 2019-07-15 DIAGNOSIS — G629 Polyneuropathy, unspecified: Secondary | ICD-10-CM | POA: Diagnosis not present

## 2019-07-21 ENCOUNTER — Encounter: Payer: Self-pay | Admitting: *Deleted

## 2019-07-21 ENCOUNTER — Encounter: Payer: Self-pay | Admitting: Family Medicine

## 2019-07-21 ENCOUNTER — Other Ambulatory Visit: Payer: Self-pay

## 2019-07-21 ENCOUNTER — Ambulatory Visit (INDEPENDENT_AMBULATORY_CARE_PROVIDER_SITE_OTHER): Payer: Medicare Other | Admitting: Family Medicine

## 2019-07-21 VITALS — BP 118/74 | HR 52 | Temp 97.9°F | Resp 15 | Ht 63.0 in | Wt 127.0 lb

## 2019-07-21 DIAGNOSIS — I7410 Embolism and thrombosis of unspecified parts of aorta: Secondary | ICD-10-CM

## 2019-07-21 DIAGNOSIS — H9190 Unspecified hearing loss, unspecified ear: Secondary | ICD-10-CM | POA: Diagnosis not present

## 2019-07-21 DIAGNOSIS — G894 Chronic pain syndrome: Secondary | ICD-10-CM

## 2019-07-21 DIAGNOSIS — Z23 Encounter for immunization: Secondary | ICD-10-CM

## 2019-07-21 DIAGNOSIS — F17200 Nicotine dependence, unspecified, uncomplicated: Secondary | ICD-10-CM

## 2019-07-21 DIAGNOSIS — I471 Supraventricular tachycardia: Secondary | ICD-10-CM

## 2019-07-21 DIAGNOSIS — F17210 Nicotine dependence, cigarettes, uncomplicated: Secondary | ICD-10-CM

## 2019-07-21 DIAGNOSIS — E785 Hyperlipidemia, unspecified: Secondary | ICD-10-CM | POA: Diagnosis not present

## 2019-07-21 DIAGNOSIS — K219 Gastro-esophageal reflux disease without esophagitis: Secondary | ICD-10-CM

## 2019-07-21 DIAGNOSIS — I10 Essential (primary) hypertension: Secondary | ICD-10-CM

## 2019-07-21 NOTE — Assessment & Plan Note (Signed)
requests referral to facility where she can get free hearing aids

## 2019-07-21 NOTE — Assessment & Plan Note (Signed)
Controlled, no change in medication DASH diet and commitment to daily physical activity for a minimum of 30 minutes discussed and encouraged, as a part of hypertension management. The importance of attaining a healthy weight is also discussed.  BP/Weight 07/21/2019 06/16/2019 04/06/2019 03/01/2019 02/28/2019 04/15/7702 4/0/3524  Systolic BP 818 590 931 121 - 624 469  Diastolic BP 74 57 72 70 - 62 83  Wt. (Lbs) 127 124.8 129.8 - 128 128 -  BMI 22.5 22.11 22.99 - 22.67 22.67 -

## 2019-07-21 NOTE — Assessment & Plan Note (Signed)
Pain management is good

## 2019-07-21 NOTE — Progress Notes (Signed)
[name]     MRN: [id number]      DOB: Jun 06, 1951   HPI Ms. Wishon is here for follow up and re-evaluation of chronic medical conditions, medication management and review of any available recent lab and radiology data.  Preventive health is updated, specifically  Cancer screening and Immunization.   Questions or concerns regarding consultations or procedures which the PT has had in the interim are  addressed. The PT denies any adverse reactions to current medications since the last visit.  Psycho social  Stresses, losses and lack of transport 3 month h/o palpitation, maybe less `than 2 mins may get light headed at times, not  Necessarily associated with the palpitations Requests link with facility where she can get free or reduced priced hearing aids Still smoking more than she wants to, but unwilling to set a quit date  ROS Denies recent fever or chills. Denies sinus pressure, nasal congestion, ear pain or sore throat. Denies chest congestion, productive cough or wheezing. Denies chest pains, and leg swelling Denies abdominal pain, nausea, vomiting,diarrhea or constipation.   Denies dysuria, frequency, hesitancy or incontinence. C/o chronic  joint pain, swelling and limitation in mobility.Adequate pain management through clinic Denies headaches, seizures, numbness, or tingling. Denies uncontrolled depression, anxiety or insomnia. Denies skin break down or rash.   PE  BP 118/74   Pulse (!) 52   Temp 97.9 F (36.6 C) (Temporal)   Resp 15   Ht 5\' 3"  (1.6 m)   Wt 127 lb (57.6 kg)   SpO2 94%   BMI 22.50 kg/m   Patient alert and oriented and in no cardiopulmonary distress.  HEENT: No facial asymmetry, EOMI,   oropharynx pink and moist.  Neck supple no JVD, no mass.  Chest: decreased air entry, no crackles or wheezes  CVS: S1, S2 no murmurs, no S3.Regular rate.  ABD: Soft non tender.   Ext: No edema  MS: Adequate though reduced ROM spine, shoulders, hips and  knees.  Skin: Intact, no ulcerations or rash noted.  Psych: Good eye contact, normal affect. Memory intact not anxious or depressed appearing.  CNS: CN 2-12 intact, power,  normal throughout.no focal deficits noted.   Assessment & Plan  Hearing loss requests referral to facility where she can get free hearing aids  Essential hypertension Controlled, no change in medication DASH diet and commitment to daily physical activity for a minimum of 30 minutes discussed and encouraged, as a part of hypertension management. The importance of attaining a healthy weight is also discussed.  BP/Weight 07/21/2019 06/16/2019 04/06/2019 03/01/2019 02/28/2019 02/16/8118 11/14/7827  Systolic BP 562 130 865 784 - 696 295  Diastolic BP 74 57 72 70 - 62 83  Wt. (Lbs) 127 124.8 129.8 - 128 128 -  BMI 22.5 22.11 22.99 - 22.67 22.67 -       Chronic pain syndrome Pain management is good  NICOTINE ADDICTION Asked:confirms currently smokes cigarettes Assess: Unwilling to quit but cutting back Advise: needs to QUIT to reduce risk of cancer, cardio and cerebrovascular disease Assist: counseled for 5 minutes and literature provided Arrange: follow up in 3 months   Hyperlipemia Hyperlipidemia:Low fat diet discussed and encouraged.   Lipid Panel  Lab Results  Component Value Date   CHOL 180 04/19/2018   HDL 57 04/19/2018   LDLCALC 104 (H) 04/19/2018   TRIG 97 04/19/2018   CHOLHDL 3.2 04/19/2018   Updated lab needed at/ before next visit. Needs o reduce fat in diet  AVNRT (AV nodal re-entry tachycardia) Reports recent episodes of palpitations and lightheadedness, ill address at upcoming Cardiology appt  GERD Controlled, no change in medication

## 2019-07-21 NOTE — Patient Instructions (Signed)
F/u with mD in 6 months, call if you needme sooner  Wellness as before  Flu vaccine today  We will try to locate location for heraring eval as discussed  Please continue to work on stopping smoking  Discuss in detail heart concerns at upcoming cardiology eval  Condolence re recent losses  Happy 68th in next 2 weeks!  Thanks for choosing [facility], we consider it a privelige to serve you.

## 2019-07-23 ENCOUNTER — Encounter: Payer: Self-pay | Admitting: Family Medicine

## 2019-07-23 NOTE — Assessment & Plan Note (Signed)
Reports recent episodes of palpitations and lightheadedness, ill address at upcoming Cardiology appt

## 2019-07-23 NOTE — Assessment & Plan Note (Signed)
Asked:confirms currently smokes cigarettes Assess: Unwilling to quit but cutting back Advise: needs to QUIT to reduce risk of cancer, cardio and cerebrovascular disease Assist: counseled for 5 minutes and literature provided Arrange: follow up in 3 months  

## 2019-07-23 NOTE — Assessment & Plan Note (Signed)
Hyperlipidemia:Low fat diet discussed and encouraged.   Lipid Panel  Lab Results  Component Value Date   CHOL 180 04/19/2018   HDL 57 04/19/2018   LDLCALC 104 (H) 04/19/2018   TRIG 97 04/19/2018   CHOLHDL 3.2 04/19/2018   Updated lab needed at/ before next visit. Needs o reduce fat in diet

## 2019-07-23 NOTE — Assessment & Plan Note (Signed)
Controlled, no change in medication  

## 2019-07-25 DIAGNOSIS — I480 Paroxysmal atrial fibrillation: Secondary | ICD-10-CM | POA: Diagnosis not present

## 2019-07-25 DIAGNOSIS — I10 Essential (primary) hypertension: Secondary | ICD-10-CM | POA: Diagnosis not present

## 2019-07-25 DIAGNOSIS — I700 Atherosclerosis of aorta: Secondary | ICD-10-CM | POA: Diagnosis not present

## 2019-07-25 DIAGNOSIS — I6523 Occlusion and stenosis of bilateral carotid arteries: Secondary | ICD-10-CM | POA: Diagnosis not present

## 2019-08-10 DIAGNOSIS — I6523 Occlusion and stenosis of bilateral carotid arteries: Secondary | ICD-10-CM | POA: Diagnosis not present

## 2019-08-10 DIAGNOSIS — I700 Atherosclerosis of aorta: Secondary | ICD-10-CM | POA: Diagnosis not present

## 2019-08-10 DIAGNOSIS — I480 Paroxysmal atrial fibrillation: Secondary | ICD-10-CM | POA: Diagnosis not present

## 2019-08-12 DIAGNOSIS — Z79899 Other long term (current) drug therapy: Secondary | ICD-10-CM | POA: Diagnosis not present

## 2019-08-12 DIAGNOSIS — M25571 Pain in right ankle and joints of right foot: Secondary | ICD-10-CM | POA: Diagnosis not present

## 2019-08-12 DIAGNOSIS — M19071 Primary osteoarthritis, right ankle and foot: Secondary | ICD-10-CM | POA: Diagnosis not present

## 2019-08-12 DIAGNOSIS — F17210 Nicotine dependence, cigarettes, uncomplicated: Secondary | ICD-10-CM | POA: Diagnosis not present

## 2019-08-12 DIAGNOSIS — G8929 Other chronic pain: Secondary | ICD-10-CM | POA: Diagnosis not present

## 2019-08-17 DIAGNOSIS — I480 Paroxysmal atrial fibrillation: Secondary | ICD-10-CM | POA: Diagnosis not present

## 2019-08-17 DIAGNOSIS — I700 Atherosclerosis of aorta: Secondary | ICD-10-CM | POA: Diagnosis not present

## 2019-08-18 DIAGNOSIS — I480 Paroxysmal atrial fibrillation: Secondary | ICD-10-CM | POA: Diagnosis not present

## 2019-08-18 DIAGNOSIS — I700 Atherosclerosis of aorta: Secondary | ICD-10-CM | POA: Diagnosis not present

## 2019-09-07 DIAGNOSIS — I10 Essential (primary) hypertension: Secondary | ICD-10-CM | POA: Diagnosis not present

## 2019-09-07 DIAGNOSIS — I700 Atherosclerosis of aorta: Secondary | ICD-10-CM | POA: Diagnosis not present

## 2019-09-07 DIAGNOSIS — I480 Paroxysmal atrial fibrillation: Secondary | ICD-10-CM | POA: Diagnosis not present

## 2019-09-07 DIAGNOSIS — I471 Supraventricular tachycardia: Secondary | ICD-10-CM | POA: Diagnosis not present

## 2019-09-13 DIAGNOSIS — Z23 Encounter for immunization: Secondary | ICD-10-CM | POA: Diagnosis not present

## 2019-09-13 DIAGNOSIS — M19071 Primary osteoarthritis, right ankle and foot: Secondary | ICD-10-CM | POA: Diagnosis not present

## 2019-09-13 DIAGNOSIS — Z1159 Encounter for screening for other viral diseases: Secondary | ICD-10-CM | POA: Diagnosis not present

## 2019-09-13 DIAGNOSIS — F17210 Nicotine dependence, cigarettes, uncomplicated: Secondary | ICD-10-CM | POA: Diagnosis not present

## 2019-09-13 DIAGNOSIS — Z79899 Other long term (current) drug therapy: Secondary | ICD-10-CM | POA: Diagnosis not present

## 2019-09-13 DIAGNOSIS — G8929 Other chronic pain: Secondary | ICD-10-CM | POA: Diagnosis not present

## 2019-09-13 DIAGNOSIS — M25571 Pain in right ankle and joints of right foot: Secondary | ICD-10-CM | POA: Diagnosis not present

## 2019-09-16 ENCOUNTER — Encounter: Payer: Self-pay | Admitting: Family Medicine

## 2019-09-26 ENCOUNTER — Ambulatory Visit (INDEPENDENT_AMBULATORY_CARE_PROVIDER_SITE_OTHER): Payer: Medicare HMO | Admitting: Family Medicine

## 2019-09-26 ENCOUNTER — Encounter: Payer: Self-pay | Admitting: Family Medicine

## 2019-09-26 ENCOUNTER — Other Ambulatory Visit: Payer: Self-pay

## 2019-09-26 VITALS — BP 116/61 | Ht 63.0 in | Wt 126.0 lb

## 2019-09-26 DIAGNOSIS — N3000 Acute cystitis without hematuria: Secondary | ICD-10-CM | POA: Diagnosis not present

## 2019-09-26 DIAGNOSIS — F17200 Nicotine dependence, unspecified, uncomplicated: Secondary | ICD-10-CM | POA: Diagnosis not present

## 2019-09-26 DIAGNOSIS — I10 Essential (primary) hypertension: Secondary | ICD-10-CM | POA: Diagnosis not present

## 2019-09-26 MED ORDER — CIPROFLOXACIN HCL 500 MG PO TABS: 500.0000 mg | ORAL_TABLET | Freq: Two times a day (BID) | ORAL | 0 refills | Status: DC

## 2019-09-26 MED ORDER — AMOXICILLIN-POT CLAVULANATE 875-125 MG PO TABS: 1.0000 | ORAL_TABLET | Freq: Two times a day (BID) | ORAL | 0 refills | Status: DC

## 2019-09-26 NOTE — Assessment & Plan Note (Signed)
Acutely symptomatic, will treat presumptively, pt to call back if symptoms persist, at that time urine c/s Increased water intake and regular voiding discussed and encouraged

## 2019-09-26 NOTE — Progress Notes (Signed)
Virtual Visit via Telephone Note  I connected with [name] on 09/26/19 at  2:40 PM EST by telephone and verified that I am speaking with the correct person using two identifiers.  Location: Patient: home Provider: office   I discussed the limitations, risks, security and privacy concerns of performing an evaluation and management service by telephone and the availability of in person appointments. I also discussed with the patient that there may be a patient responsible charge related to this service. The patient expressed understanding and agreed to proceed.   History of Present Illness: 3 DAY H/O PRESSURE AND URINARY FREQUENCY, NO FEVeR OR CHILLS OR FLANK PAIN, no hematuria reported Prior to this has been well. Unfortunately, no success in reducing smoking, still 1 PPD Denies any concerns   Observations/Objective: BP 116/61   Ht 5\' 3"  (1.6 m)   Wt 126 lb (57.2 kg)   BMI 22.32 kg/m  Good communication with no confusion and intact memory. Alert and oriented x 3 No signs of respiratory distress during speech    Assessment and Plan: Acute cystitis Acutely symptomatic, will treat presumptively, pt to call back if symptoms persist, at that time urine c/s Increased water intake and regular voiding discussed and encouraged  NICOTINE ADDICTION Asked:confirms currently smokes cigarettes, 1 PPD Assess: Unwilling to quit but cutting back Advise: needs to QUIT to reduce risk of cancer, cardio and cerebrovascular disease Assist: counseled for 5 minutes and literature provided Arrange: follow up in 3 months   Essential hypertension Controlled, no change in medication     Follow Up Instructions:    I discussed the assessment and treatment plan with the patient. The patient was provided an opportunity to ask questions and all were answered. The patient agreed with the plan and demonstrated an understanding of the instructions.   The patient was advised to call back or  seek an in-person evaluation if the symptoms worsen or if the condition fails to improve as anticipated.  I provided 15 minutes of non-face-to-face time during this encounter.   [name], MD

## 2019-09-26 NOTE — Assessment & Plan Note (Signed)
Controlled, no change in medication  

## 2019-09-26 NOTE — Patient Instructions (Signed)
F/U as before, call if you need me sooner  You are treated with antibiotic for 5 days for acute bladder infection  Please ensure you drink water often and void often  Please continue to work on cutting back cigaretttes

## 2019-09-26 NOTE — Assessment & Plan Note (Signed)
Asked:confirms currently smokes cigarettes, 1 PPD Assess: Unwilling to quit but cutting back Advise: needs to QUIT to reduce risk of cancer, cardio and cerebrovascular disease Assist: counseled for 5 minutes and literature provided Arrange: follow up in 3 months

## 2019-09-27 DIAGNOSIS — R6889 Other general symptoms and signs: Secondary | ICD-10-CM | POA: Diagnosis not present

## 2019-09-29 ENCOUNTER — Other Ambulatory Visit: Payer: Self-pay

## 2019-09-29 ENCOUNTER — Ambulatory Visit: Payer: Medicare Other

## 2019-09-29 ENCOUNTER — Encounter: Payer: Self-pay | Admitting: Family Medicine

## 2019-09-29 ENCOUNTER — Ambulatory Visit (INDEPENDENT_AMBULATORY_CARE_PROVIDER_SITE_OTHER): Payer: Medicare HMO | Admitting: Family Medicine

## 2019-09-29 VITALS — BP 116/61 | HR 70 | Resp 15 | Ht 63.0 in | Wt 127.0 lb

## 2019-09-29 DIAGNOSIS — Z0000 Encounter for general adult medical examination without abnormal findings: Secondary | ICD-10-CM

## 2019-09-29 NOTE — Patient Instructions (Signed)
[name] , Thank you for taking time to come for your Medicare Wellness Visit. I appreciate your ongoing commitment to your health goals. Please review the following plan we discussed and let me know if I can assist you in the future.   Please continue to practice social distancing to keep you, your family, and our community safe.  If you must go out, please wear a Mask and practice good handwashing.  We hope that you have a safe, happy, healthy holiday season.  Screening recommendations/referrals: Colonoscopy: up to date  Mammogram: up to date  Bone Density: up to date  Recommended yearly ophthalmology/optometry visit for glaucoma screening and checkup Recommended yearly dental visit for hygiene and checkup  Vaccinations: Influenza vaccine: up to date  Pneumococcal vaccine: up to date  Tdap vaccine: up to date  Shingles vaccine: up to date     Advanced directives: Refused   Conditions/risks identified: Falls, hard of hearing, chronic pain, smoking, impaired mobility  Next appointment: 01/18/2020   Preventive Care 17 Years and Older, Female Preventive care refers to lifestyle choices and visits with your health care provider that can promote health and wellness. What does preventive care include?  A yearly physical exam. This is also called an annual well check.  Dental exams once or twice a year.  Routine eye exams. Ask your health care provider how often you should have your eyes checked.  Personal lifestyle choices, including:  Daily care of your teeth and gums.  Regular physical activity.  Eating a healthy diet.  Avoiding tobacco and drug use.  Limiting alcohol use.  Practicing safe sex.  Taking low-dose aspirin every day.  Taking vitamin and mineral supplements as recommended by your health care provider. What happens during an annual well check? The services and screenings done by your health care provider during your annual well check will depend on  your age, overall health, lifestyle risk factors, and family history of disease. Counseling  Your health care provider may ask you questions about your:  Alcohol use.  Tobacco use.  Drug use.  Emotional well-being.  Home and relationship well-being.  Sexual activity.  Eating habits.  History of falls.  Memory and ability to understand (cognition).  Work and work Statistician.  Reproductive health. Screening  You may have the following tests or measurements:  Height, weight, and BMI.  Blood pressure.  Lipid and cholesterol levels. These may be checked every 5 years, or more frequently if you are over 38 years old.  Skin check.  Lung cancer screening. You may have this screening every year starting at age 10 if you have a 30-pack-year history of smoking and currently smoke or have quit within the past 15 years.  Fecal occult blood test (FOBT) of the stool. You may have this test every year starting at age 41.  Flexible sigmoidoscopy or colonoscopy. You may have a sigmoidoscopy every 5 years or a colonoscopy every 10 years starting at age 24.  Hepatitis C blood test.  Hepatitis B blood test.  Sexually transmitted disease (STD) testing.  Diabetes screening. This is done by checking your blood sugar (glucose) after you have not eaten for a while (fasting). You may have this done every 1-3 years.  Bone density scan. This is done to screen for osteoporosis. You may have this done starting at age 40.  Mammogram. This may be done every 1-2 years. Talk to your health care provider about how often you should have regular mammograms. Talk with your  health care provider about your test results, treatment options, and if necessary, the need for more tests. Vaccines  Your health care provider may recommend certain vaccines, such as:  Influenza vaccine. This is recommended every year.  Tetanus, diphtheria, and acellular pertussis (Tdap, Td) vaccine. You may need a Td booster  every 10 years.  Zoster vaccine. You may need this after age 73.  Pneumococcal 13-valent conjugate (PCV13) vaccine. One dose is recommended after age 30.  Pneumococcal polysaccharide (PPSV23) vaccine. One dose is recommended after age 63. Talk to your health care provider about which screenings and vaccines you need and how often you need them. This information is not intended to replace advice given to you by your health care provider. Make sure you discuss any questions you have with your health care provider. Document Released: 11/23/2015 Document Revised: 07/16/2016 Document Reviewed: 08/28/2015 Elsevier Interactive Patient Education  2017 [employer] Prevention in the Home Falls can cause injuries. They can happen to people of all ages. There are many things you can do to make your home safe and to help prevent falls. What can I do on the outside of my home?  Regularly fix the edges of walkways and driveways and fix any cracks.  Remove anything that might make you trip as you walk through a door, such as a raised step or threshold.  Trim any bushes or trees on the path to your home.  Use bright outdoor lighting.  Clear any walking paths of anything that might make someone trip, such as rocks or tools.  Regularly check to see if handrails are loose or broken. Make sure that both sides of any steps have handrails.  Any raised decks and porches should have guardrails on the edges.  Have any leaves, snow, or ice cleared regularly.  Use sand or salt on walking paths during winter.  Clean up any spills in your garage right away. This includes oil or grease spills. What can I do in the bathroom?  Use night lights.  Install grab bars by the toilet and in the tub and shower. Do not use towel bars as grab bars.  Use non-skid mats or decals in the tub or shower.  If you need to sit down in the shower, use a plastic, non-slip stool.  Keep the floor dry. Clean up any  water that spills on the floor as soon as it happens.  Remove soap buildup in the tub or shower regularly.  Attach bath mats securely with double-sided non-slip rug tape.  Do not have throw rugs and other things on the floor that can make you trip. What can I do in the bedroom?  Use night lights.  Make sure that you have a light by your bed that is easy to reach.  Do not use any sheets or blankets that are too big for your bed. They should not hang down onto the floor.  Have a firm chair that has side arms. You can use this for support while you get dressed.  Do not have throw rugs and other things on the floor that can make you trip. What can I do in the kitchen?  Clean up any spills right away.  Avoid walking on wet floors.  Keep items that you use a lot in easy-to-reach places.  If you need to reach something above you, use a strong step stool that has a grab bar.  Keep electrical cords out of the way.  Do not use  floor polish or wax that makes floors slippery. If you must use wax, use non-skid floor wax.  Do not have throw rugs and other things on the floor that can make you trip. What can I do with my stairs?  Do not leave any items on the stairs.  Make sure that there are handrails on both sides of the stairs and use them. Fix handrails that are broken or loose. Make sure that handrails are as long as the stairways.  Check any carpeting to make sure that it is firmly attached to the stairs. Fix any carpet that is loose or worn.  Avoid having throw rugs at the top or bottom of the stairs. If you do have throw rugs, attach them to the floor with carpet tape.  Make sure that you have a light switch at the top of the stairs and the bottom of the stairs. If you do not have them, ask someone to add them for you. What else can I do to help prevent falls?  Wear shoes that:  Do not have high heels.  Have rubber bottoms.  Are comfortable and fit you well.  Are closed  at the toe. Do not wear sandals.  If you use a stepladder:  Make sure that it is fully opened. Do not climb a closed stepladder.  Make sure that both sides of the stepladder are locked into place.  Ask someone to hold it for you, if possible.  Clearly mark and make sure that you can see:  Any grab bars or handrails.  First and last steps.  Where the edge of each step is.  Use tools that help you move around (mobility aids) if they are needed. These include:  Canes.  Walkers.  Scooters.  Crutches.  Turn on the lights when you go into a dark area. Replace any light bulbs as soon as they burn out.  Set up your furniture so you have a clear path. Avoid moving your furniture around.  If any of your floors are uneven, fix them.  If there are any pets around you, be aware of where they are.  Review your medicines with your doctor. Some medicines can make you feel dizzy. This can increase your chance of falling. Ask your doctor what other things that you can do to help prevent falls. This information is not intended to replace advice given to you by your health care provider. Make sure you discuss any questions you have with your health care provider. Document Released: 08/23/2009 Document Revised: 04/03/2016 Document Reviewed: 12/01/2014 Elsevier Interactive Patient Education  2017 [employer].
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
## 2019-10-11 DIAGNOSIS — I10 Essential (primary) hypertension: Secondary | ICD-10-CM | POA: Diagnosis not present

## 2019-10-11 DIAGNOSIS — B078 Other viral warts: Secondary | ICD-10-CM | POA: Diagnosis not present

## 2019-10-11 DIAGNOSIS — L720 Epidermal cyst: Secondary | ICD-10-CM | POA: Diagnosis not present

## 2019-10-11 DIAGNOSIS — L821 Other seborrheic keratosis: Secondary | ICD-10-CM | POA: Diagnosis not present

## 2019-10-13 DIAGNOSIS — R05 Cough: Secondary | ICD-10-CM | POA: Diagnosis not present

## 2019-10-13 DIAGNOSIS — G4700 Insomnia, unspecified: Secondary | ICD-10-CM | POA: Diagnosis not present

## 2019-10-13 DIAGNOSIS — M25571 Pain in right ankle and joints of right foot: Secondary | ICD-10-CM | POA: Diagnosis not present

## 2019-10-13 DIAGNOSIS — F17210 Nicotine dependence, cigarettes, uncomplicated: Secondary | ICD-10-CM | POA: Diagnosis not present

## 2019-10-13 DIAGNOSIS — Z79899 Other long term (current) drug therapy: Secondary | ICD-10-CM | POA: Diagnosis not present

## 2019-10-17 ENCOUNTER — Ambulatory Visit: Payer: Medicare Other

## 2019-10-27 DIAGNOSIS — Z961 Presence of intraocular lens: Secondary | ICD-10-CM | POA: Diagnosis not present

## 2019-10-27 DIAGNOSIS — H04123 Dry eye syndrome of bilateral lacrimal glands: Secondary | ICD-10-CM | POA: Diagnosis not present

## 2019-10-27 DIAGNOSIS — H02883 Meibomian gland dysfunction of right eye, unspecified eyelid: Secondary | ICD-10-CM | POA: Diagnosis not present

## 2019-10-27 DIAGNOSIS — H1013 Acute atopic conjunctivitis, bilateral: Secondary | ICD-10-CM | POA: Diagnosis not present

## 2019-11-10 DIAGNOSIS — Z79899 Other long term (current) drug therapy: Secondary | ICD-10-CM | POA: Diagnosis not present

## 2019-11-10 DIAGNOSIS — F17210 Nicotine dependence, cigarettes, uncomplicated: Secondary | ICD-10-CM | POA: Diagnosis not present

## 2019-11-10 DIAGNOSIS — J410 Simple chronic bronchitis: Secondary | ICD-10-CM | POA: Diagnosis not present

## 2019-11-10 DIAGNOSIS — M25571 Pain in right ankle and joints of right foot: Secondary | ICD-10-CM | POA: Diagnosis not present

## 2019-11-16 ENCOUNTER — Encounter: Payer: Self-pay | Admitting: Family Medicine

## 2019-12-08 DIAGNOSIS — G629 Polyneuropathy, unspecified: Secondary | ICD-10-CM | POA: Diagnosis not present

## 2019-12-08 DIAGNOSIS — Z79899 Other long term (current) drug therapy: Secondary | ICD-10-CM | POA: Diagnosis not present

## 2019-12-08 DIAGNOSIS — G8929 Other chronic pain: Secondary | ICD-10-CM | POA: Diagnosis not present

## 2019-12-08 DIAGNOSIS — M19071 Primary osteoarthritis, right ankle and foot: Secondary | ICD-10-CM | POA: Diagnosis not present

## 2019-12-08 DIAGNOSIS — M25571 Pain in right ankle and joints of right foot: Secondary | ICD-10-CM | POA: Diagnosis not present

## 2019-12-26 DIAGNOSIS — I480 Paroxysmal atrial fibrillation: Secondary | ICD-10-CM | POA: Diagnosis not present

## 2019-12-26 DIAGNOSIS — E782 Mixed hyperlipidemia: Secondary | ICD-10-CM | POA: Diagnosis not present

## 2019-12-26 DIAGNOSIS — I6523 Occlusion and stenosis of bilateral carotid arteries: Secondary | ICD-10-CM | POA: Diagnosis not present

## 2019-12-26 DIAGNOSIS — I471 Supraventricular tachycardia: Secondary | ICD-10-CM | POA: Diagnosis not present

## 2019-12-26 DIAGNOSIS — I700 Atherosclerosis of aorta: Secondary | ICD-10-CM | POA: Diagnosis not present

## 2019-12-26 DIAGNOSIS — I25810 Atherosclerosis of coronary artery bypass graft(s) without angina pectoris: Secondary | ICD-10-CM | POA: Diagnosis not present

## 2019-12-26 DIAGNOSIS — I493 Ventricular premature depolarization: Secondary | ICD-10-CM | POA: Diagnosis not present

## 2019-12-26 DIAGNOSIS — R002 Palpitations: Secondary | ICD-10-CM | POA: Diagnosis not present

## 2019-12-26 DIAGNOSIS — I10 Essential (primary) hypertension: Secondary | ICD-10-CM | POA: Diagnosis not present

## 2019-12-30 ENCOUNTER — Encounter: Payer: Self-pay | Admitting: Family Medicine

## 2020-01-05 DIAGNOSIS — Z79899 Other long term (current) drug therapy: Secondary | ICD-10-CM | POA: Diagnosis not present

## 2020-01-05 DIAGNOSIS — M19071 Primary osteoarthritis, right ankle and foot: Secondary | ICD-10-CM | POA: Diagnosis not present

## 2020-01-05 DIAGNOSIS — M25571 Pain in right ankle and joints of right foot: Secondary | ICD-10-CM | POA: Diagnosis not present

## 2020-01-05 DIAGNOSIS — G8929 Other chronic pain: Secondary | ICD-10-CM | POA: Diagnosis not present

## 2020-01-05 DIAGNOSIS — G629 Polyneuropathy, unspecified: Secondary | ICD-10-CM | POA: Diagnosis not present

## 2020-01-18 ENCOUNTER — Ambulatory Visit: Payer: Medicare Other | Admitting: Family Medicine

## 2020-01-19 DIAGNOSIS — I25810 Atherosclerosis of coronary artery bypass graft(s) without angina pectoris: Secondary | ICD-10-CM | POA: Diagnosis not present

## 2020-01-19 DIAGNOSIS — I6523 Occlusion and stenosis of bilateral carotid arteries: Secondary | ICD-10-CM | POA: Diagnosis not present

## 2020-01-19 DIAGNOSIS — I480 Paroxysmal atrial fibrillation: Secondary | ICD-10-CM | POA: Diagnosis not present

## 2020-01-19 DIAGNOSIS — J432 Centrilobular emphysema: Secondary | ICD-10-CM | POA: Diagnosis not present

## 2020-01-19 DIAGNOSIS — I10 Essential (primary) hypertension: Secondary | ICD-10-CM | POA: Diagnosis not present

## 2020-01-19 DIAGNOSIS — E782 Mixed hyperlipidemia: Secondary | ICD-10-CM | POA: Diagnosis not present

## 2020-01-19 DIAGNOSIS — I493 Ventricular premature depolarization: Secondary | ICD-10-CM | POA: Diagnosis not present

## 2020-01-31 DIAGNOSIS — Z961 Presence of intraocular lens: Secondary | ICD-10-CM | POA: Diagnosis not present

## 2020-01-31 DIAGNOSIS — H539 Unspecified visual disturbance: Secondary | ICD-10-CM | POA: Diagnosis not present

## 2020-01-31 DIAGNOSIS — H02883 Meibomian gland dysfunction of right eye, unspecified eyelid: Secondary | ICD-10-CM | POA: Diagnosis not present

## 2020-01-31 DIAGNOSIS — G4733 Obstructive sleep apnea (adult) (pediatric): Secondary | ICD-10-CM | POA: Diagnosis not present

## 2020-01-31 DIAGNOSIS — H04123 Dry eye syndrome of bilateral lacrimal glands: Secondary | ICD-10-CM | POA: Diagnosis not present

## 2020-01-31 DIAGNOSIS — H02886 Meibomian gland dysfunction of left eye, unspecified eyelid: Secondary | ICD-10-CM | POA: Diagnosis not present

## 2020-01-31 DIAGNOSIS — H1013 Acute atopic conjunctivitis, bilateral: Secondary | ICD-10-CM | POA: Diagnosis not present

## 2020-02-02 DIAGNOSIS — E559 Vitamin D deficiency, unspecified: Secondary | ICD-10-CM | POA: Diagnosis not present

## 2020-02-02 DIAGNOSIS — E7800 Pure hypercholesterolemia, unspecified: Secondary | ICD-10-CM | POA: Diagnosis not present

## 2020-02-02 DIAGNOSIS — Z1159 Encounter for screening for other viral diseases: Secondary | ICD-10-CM | POA: Diagnosis not present

## 2020-02-02 DIAGNOSIS — Z0000 Encounter for general adult medical examination without abnormal findings: Secondary | ICD-10-CM | POA: Diagnosis not present

## 2020-02-02 DIAGNOSIS — G8929 Other chronic pain: Secondary | ICD-10-CM | POA: Diagnosis not present

## 2020-02-02 DIAGNOSIS — Z79899 Other long term (current) drug therapy: Secondary | ICD-10-CM | POA: Diagnosis not present

## 2020-02-02 DIAGNOSIS — M25571 Pain in right ankle and joints of right foot: Secondary | ICD-10-CM | POA: Diagnosis not present

## 2020-03-01 ENCOUNTER — Emergency Department
Admission: EM | Admit: 2020-03-01 | Discharge: 2020-03-01 | Disposition: A | Discharge status: Home / Self Care | Payer: Medicare HMO | Attending: Emergency Medicine | Admitting: Emergency Medicine

## 2020-03-01 ENCOUNTER — Encounter: Payer: Self-pay | Admitting: Emergency Medicine

## 2020-03-01 ENCOUNTER — Other Ambulatory Visit: Payer: Self-pay

## 2020-03-01 DIAGNOSIS — J449 Chronic obstructive pulmonary disease, unspecified: Secondary | ICD-10-CM | POA: Insufficient documentation

## 2020-03-01 DIAGNOSIS — R1032 Left lower quadrant pain: Secondary | ICD-10-CM | POA: Diagnosis not present

## 2020-03-01 DIAGNOSIS — I10 Essential (primary) hypertension: Secondary | ICD-10-CM | POA: Insufficient documentation

## 2020-03-01 DIAGNOSIS — F17210 Nicotine dependence, cigarettes, uncomplicated: Secondary | ICD-10-CM | POA: Diagnosis not present

## 2020-03-01 DIAGNOSIS — Z85118 Personal history of other malignant neoplasm of bronchus and lung: Secondary | ICD-10-CM | POA: Diagnosis not present

## 2020-03-01 DIAGNOSIS — Z79899 Other long term (current) drug therapy: Secondary | ICD-10-CM | POA: Insufficient documentation

## 2020-03-01 DIAGNOSIS — K5792 Diverticulitis of intestine, part unspecified, without perforation or abscess without bleeding: Secondary | ICD-10-CM | POA: Diagnosis not present

## 2020-03-01 DIAGNOSIS — I70219 Atherosclerosis of native arteries of extremities with intermittent claudication, unspecified extremity: Secondary | ICD-10-CM | POA: Diagnosis not present

## 2020-03-01 LAB — CBC
HCT: 44.1 % (ref 36.0–46.0)
Hemoglobin: 15 g/dL (ref 12.0–15.0)
MCH: 31.3 pg (ref 26.0–34.0)
MCHC: 34 g/dL (ref 30.0–36.0)
MCV: 91.9 fL (ref 80.0–100.0)
Platelets: 286 10*3/uL (ref 150–400)
RBC: 4.8 MIL/uL (ref 3.87–5.11)
RDW: 12.8 % (ref 11.5–15.5)
WBC: 9.9 10*3/uL (ref 4.0–10.5)
nRBC: 0 % (ref 0.0–0.2)

## 2020-03-01 LAB — URINALYSIS, COMPLETE (UACMP) WITH MICROSCOPIC
Bacteria, UA: NONE SEEN
Bilirubin Urine: NEGATIVE
Glucose, UA: NEGATIVE mg/dL
Ketones, ur: NEGATIVE mg/dL
Leukocytes,Ua: NEGATIVE
Nitrite: NEGATIVE
Protein, ur: NEGATIVE mg/dL
Specific Gravity, Urine: 1.012 (ref 1.005–1.030)
pH: 5 (ref 5.0–8.0)

## 2020-03-01 LAB — COMPREHENSIVE METABOLIC PANEL
ALT: 20 U/L (ref 0–44)
AST: 23 U/L (ref 15–41)
Albumin: 4.7 g/dL (ref 3.5–5.0)
Alkaline Phosphatase: 62 U/L (ref 38–126)
Anion gap: 8 (ref 5–15)
BUN: 14 mg/dL (ref 8–23)
CO2: 30 mmol/L (ref 22–32)
Calcium: 9.7 mg/dL (ref 8.9–10.3)
Chloride: 100 mmol/L (ref 98–111)
Creatinine, Ser: 0.64 mg/dL (ref 0.44–1.00)
GFR calc Af Amer: >60 mL/min (ref >60)
GFR calc non Af Amer: >60 mL/min (ref >60)
Glucose, Bld: 139 mg/dL — ABNORMAL HIGH (ref 70–99)
Potassium: 4.6 mmol/L (ref 3.5–5.1)
Sodium: 138 mmol/L (ref 135–145)
Total Bilirubin: 0.5 mg/dL (ref 0.3–1.2)
Total Protein: 7.7 g/dL (ref 6.5–8.1)

## 2020-03-01 LAB — LIPASE, BLOOD: Lipase: 23 U/L (ref 11–51)

## 2020-03-01 MED ORDER — AMOXICILLIN-POT CLAVULANATE 875-125 MG PO TABS: 1.0000 | ORAL_TABLET | Freq: Two times a day (BID) | ORAL | 0 refills | Status: AC

## 2020-03-01 MED ORDER — AMOXICILLIN-POT CLAVULANATE 875-125 MG PO TABS
1.0000 | ORAL_TABLET | Freq: Once | ORAL | Status: AC
  Administered 2020-03-01: 1 via ORAL
  Filled 2020-03-01: qty 1

## 2020-03-01 NOTE — ED Triage Notes (Addendum)
Patient to ER for c/o lower left sided abd pain since Saturday. Patient reports h/o recurrent colon infections after colon resection years ago.

## 2020-03-01 NOTE — ED Notes (Signed)
Pt states she has reoccurring diverticulitis and has an appointment Monday with her GI doc but needs abx to feel better until then
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
## 2020-03-02 DIAGNOSIS — M19071 Primary osteoarthritis, right ankle and foot: Secondary | ICD-10-CM | POA: Diagnosis not present

## 2020-03-02 DIAGNOSIS — G8929 Other chronic pain: Secondary | ICD-10-CM | POA: Diagnosis not present

## 2020-03-02 DIAGNOSIS — M25571 Pain in right ankle and joints of right foot: Secondary | ICD-10-CM | POA: Diagnosis not present

## 2020-03-02 DIAGNOSIS — Z79899 Other long term (current) drug therapy: Secondary | ICD-10-CM | POA: Diagnosis not present

## 2020-03-05 DIAGNOSIS — K2960 Other gastritis without bleeding: Secondary | ICD-10-CM | POA: Diagnosis not present

## 2020-03-05 DIAGNOSIS — K449 Diaphragmatic hernia without obstruction or gangrene: Secondary | ICD-10-CM | POA: Diagnosis not present

## 2020-03-05 DIAGNOSIS — K2100 Gastro-esophageal reflux disease with esophagitis, without bleeding: Secondary | ICD-10-CM | POA: Diagnosis not present

## 2020-03-05 DIAGNOSIS — Z8719 Personal history of other diseases of the digestive system: Secondary | ICD-10-CM | POA: Diagnosis not present

## 2020-04-02 DIAGNOSIS — M25571 Pain in right ankle and joints of right foot: Secondary | ICD-10-CM | POA: Diagnosis not present

## 2020-04-02 DIAGNOSIS — Z79899 Other long term (current) drug therapy: Secondary | ICD-10-CM | POA: Diagnosis not present

## 2020-04-02 DIAGNOSIS — F1020 Alcohol dependence, uncomplicated: Secondary | ICD-10-CM | POA: Diagnosis not present

## 2020-04-02 DIAGNOSIS — Z20822 Contact with and (suspected) exposure to covid-19: Secondary | ICD-10-CM | POA: Diagnosis not present

## 2020-04-02 DIAGNOSIS — Z131 Encounter for screening for diabetes mellitus: Secondary | ICD-10-CM | POA: Diagnosis not present

## 2020-04-02 DIAGNOSIS — G8929 Other chronic pain: Secondary | ICD-10-CM | POA: Diagnosis not present

## 2020-04-02 DIAGNOSIS — M19071 Primary osteoarthritis, right ankle and foot: Secondary | ICD-10-CM | POA: Diagnosis not present

## 2020-04-03 DIAGNOSIS — H02883 Meibomian gland dysfunction of right eye, unspecified eyelid: Secondary | ICD-10-CM | POA: Insufficient documentation

## 2020-04-03 DIAGNOSIS — H02886 Meibomian gland dysfunction of left eye, unspecified eyelid: Secondary | ICD-10-CM | POA: Insufficient documentation

## 2020-04-03 DIAGNOSIS — H1013 Acute atopic conjunctivitis, bilateral: Secondary | ICD-10-CM | POA: Diagnosis not present

## 2020-04-03 DIAGNOSIS — H04123 Dry eye syndrome of bilateral lacrimal glands: Secondary | ICD-10-CM | POA: Diagnosis not present

## 2020-04-03 DIAGNOSIS — Z961 Presence of intraocular lens: Secondary | ICD-10-CM | POA: Diagnosis not present

## 2020-04-03 DIAGNOSIS — H0288A Meibomian gland dysfunction right eye, upper and lower eyelids: Secondary | ICD-10-CM | POA: Diagnosis not present

## 2020-04-03 DIAGNOSIS — H539 Unspecified visual disturbance: Secondary | ICD-10-CM | POA: Diagnosis not present

## 2020-04-07 IMAGING — CT CT CHEST W/ CM
2 of 4 series · 15 of 36 positions shown, 18 images · IV contrast (iopamidol)
Comparison: Chest CT 09/11/2017

CLINICAL DATA: Patient with squamous cell carcinoma of the right
lung.

EXAM:
CT CHEST WITH CONTRAST
TECHNIQUE: Multidetector CT imaging of the chest was performed during
intravenous contrast administration.
CONTRAST:  75mL [id number] IOPAMIDOL ([id number]) INJECTION 61%

[Series 2: axial st · axial · 0.58mm/px · z∈[-282,-62]mm · 12 of 132 slices shown, 15 images]
[im 11/132  mediastinal]
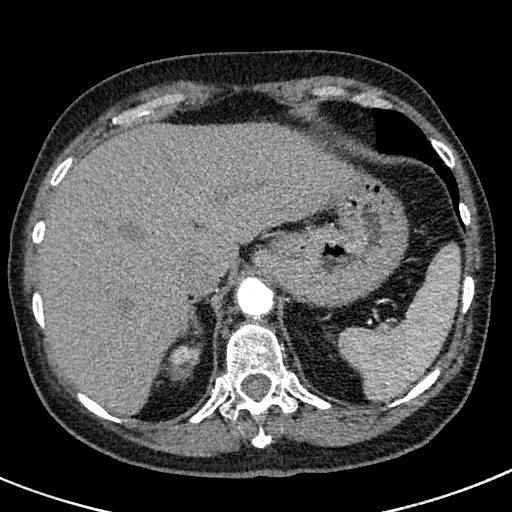
[im 11/132  lung]
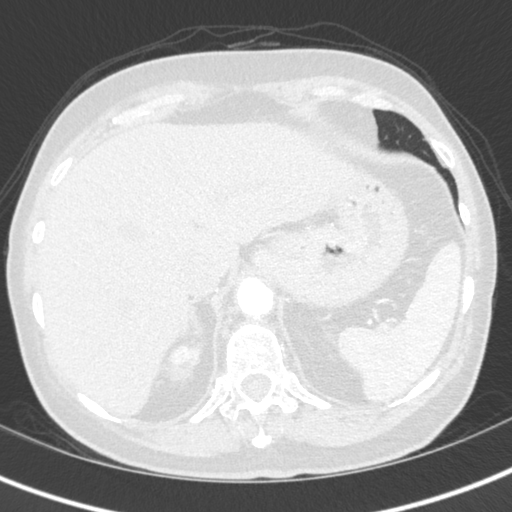
[im 21/132  lung]
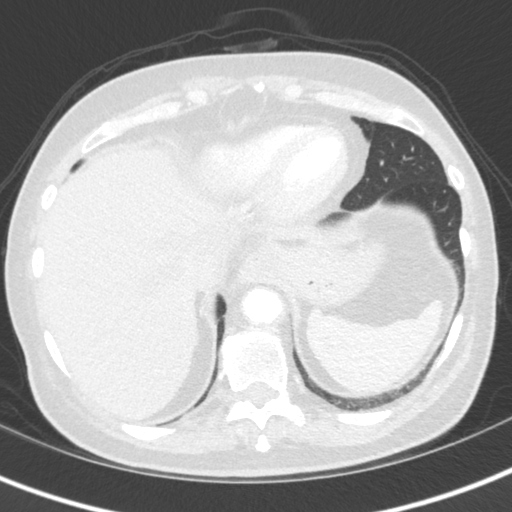
[im 31/132  lung]
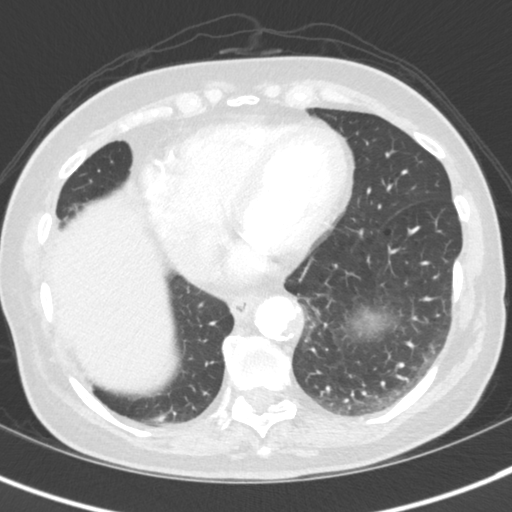
[im 41/132  lung]
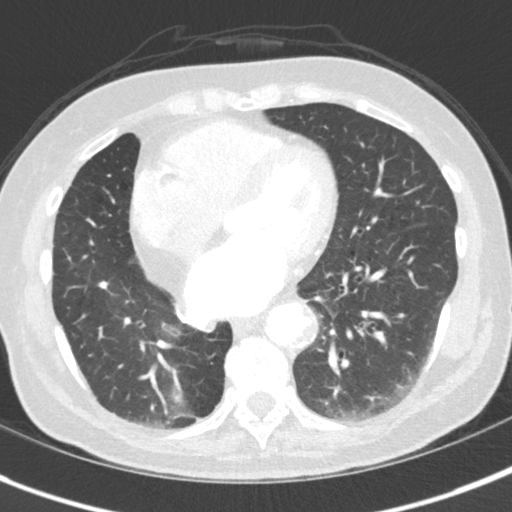
[im 51/132  mediastinal]
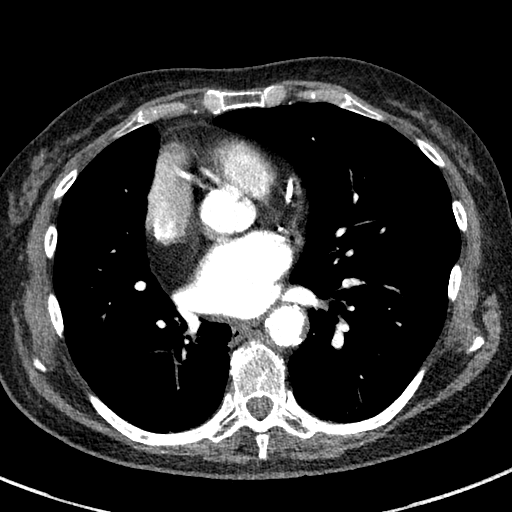
[im 51/132  lung]
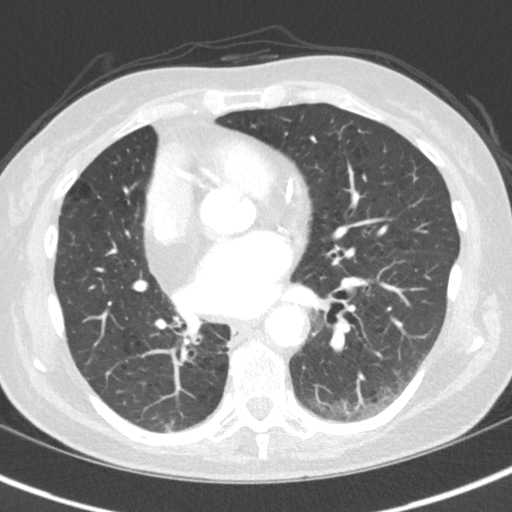
[im 61/132  lung]
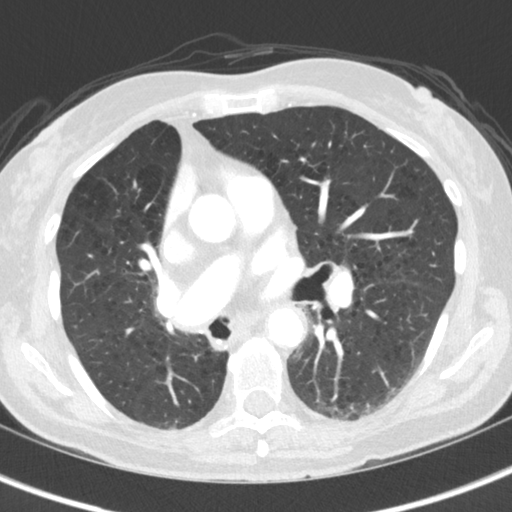
[im 71/132  lung]
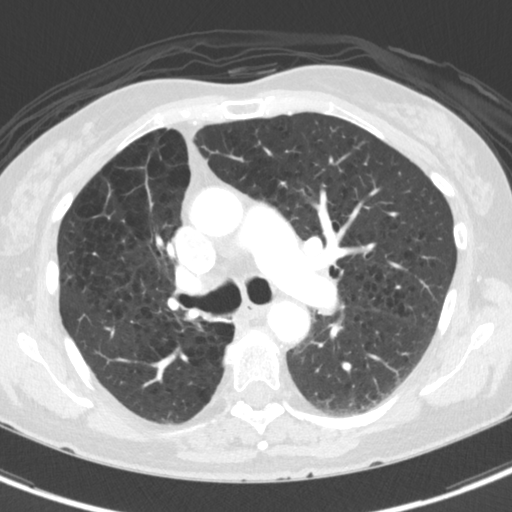
[im 81/132  lung]
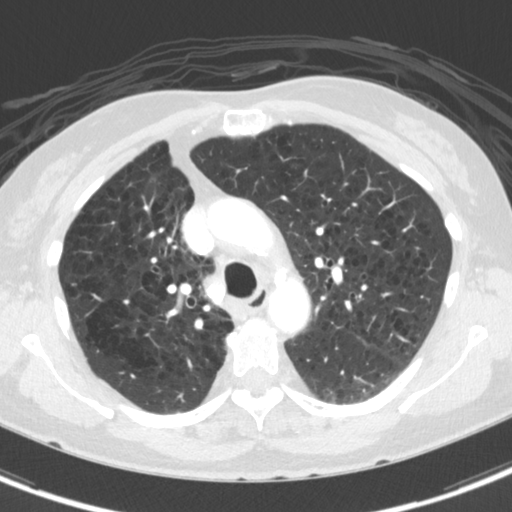
[im 91/132  mediastinal]
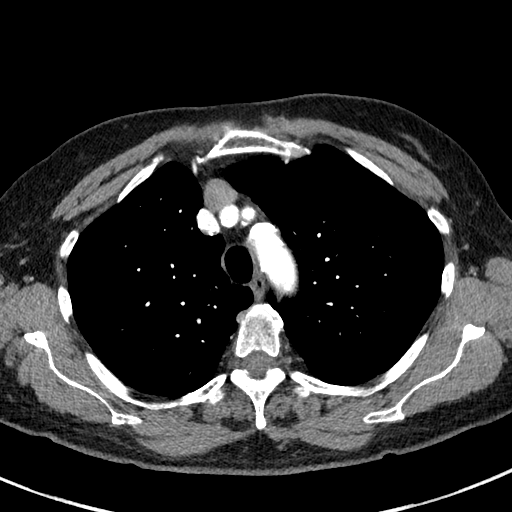
[im 91/132  lung]
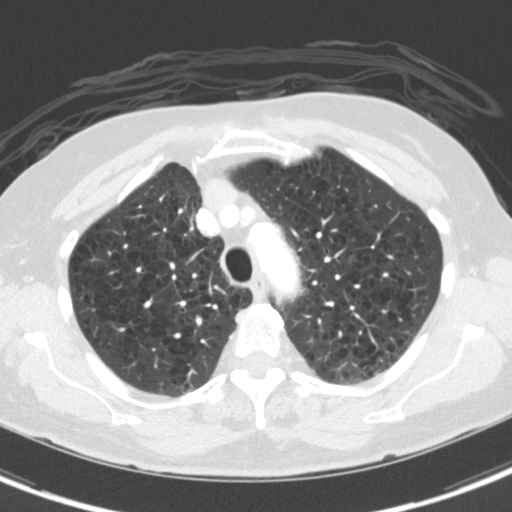
[im 101/132  lung]
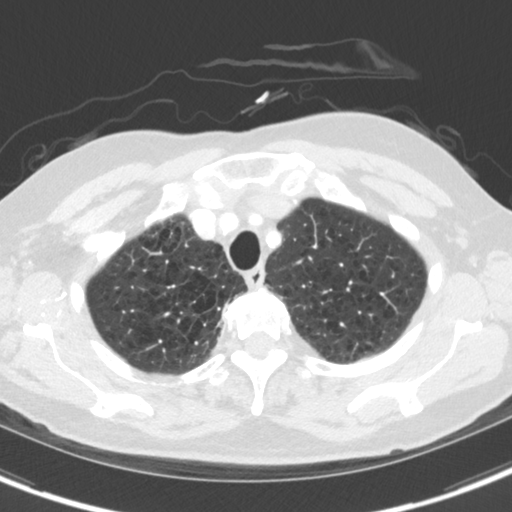
[im 111/132  lung]
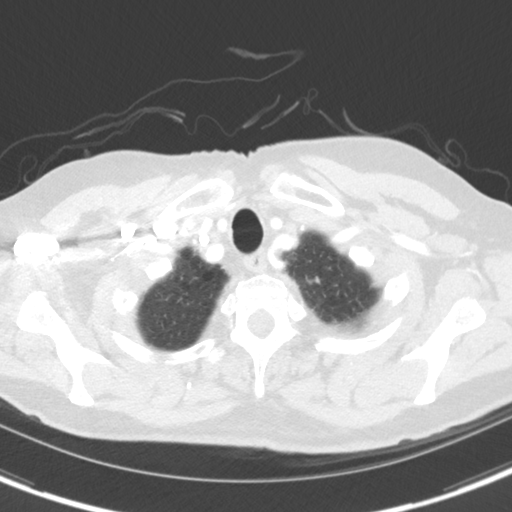
[im 121/132  lung]
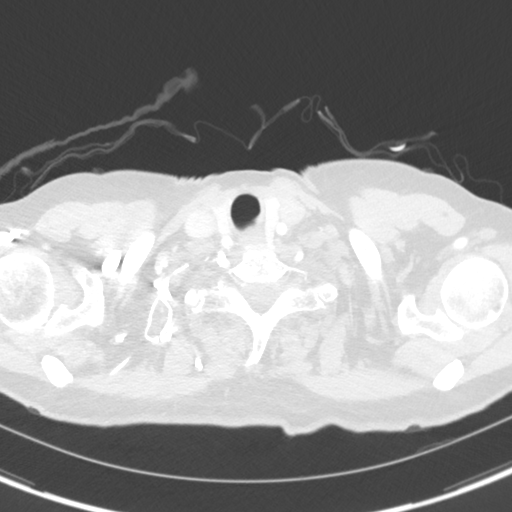

[Series 5: coronal · coronal · 0.54mm/px · 3 of 119 slices shown]
[im 24/119  lung]
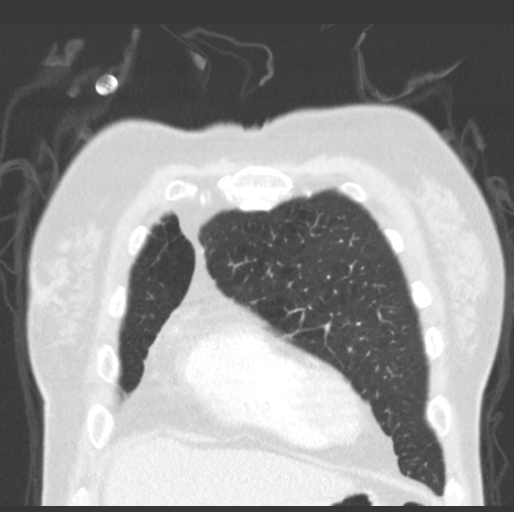
[im 48/119  lung]
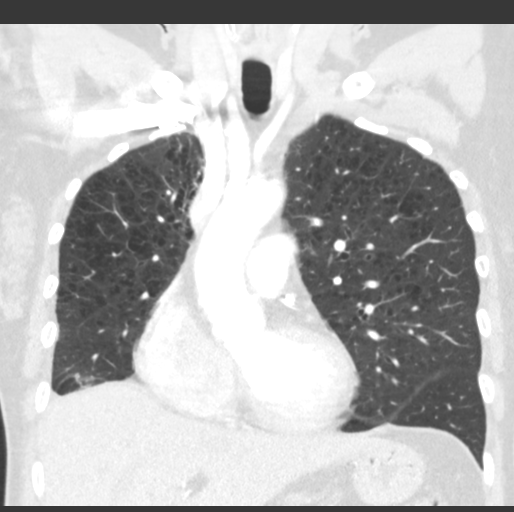
[im 71/119  lung]
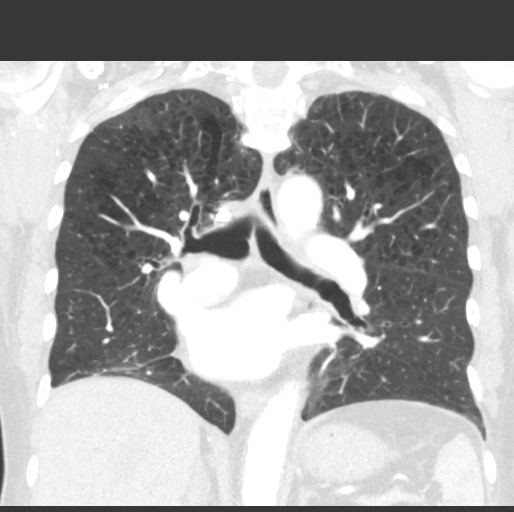

[15 of 36 positions shown; findings below may reference images not displayed]

FINDINGS: Cardiovascular: Heart is enlarged. No pericardial effusion. Coronary
arterial vascular calcifications. Thoracic aortic vascular
calcifications. Ulcerated atherosclerotic plaque in the descending
thoracic aorta. Lipoma is hypertrophy intra-atrial septum.

Mediastinum/Nodes: No enlarged axillary, mediastinal or hilar
lymphadenopathy.

Lungs/Pleura: Central airways are patent. Dependent atelectasis
within the bilateral lower lobes. Centrilobular and paraseptal
emphysematous change. Stable postsurgical changes compatible with
right lower lobectomy. No evidence for locally recurrent or
metastatic disease. No pleural effusion or pneumothorax.

Upper Abdomen: Unremarkable

Musculoskeletal: Thoracic spine degenerative changes. No aggressive
or acute appearing osseous lesions.
IMPRESSION: 1. Stable postsurgical changes right hemithorax. No evidence for
locally recurrent or metastatic disease.
2. Aortic Atherosclerosis ([id number].Z) and Emphysema ([id number].P).

## 2020-04-25 ENCOUNTER — Other Ambulatory Visit: Payer: Self-pay

## 2020-04-25 ENCOUNTER — Ambulatory Visit (INDEPENDENT_AMBULATORY_CARE_PROVIDER_SITE_OTHER): Payer: Medicare HMO | Admitting: Family Medicine

## 2020-04-25 ENCOUNTER — Encounter: Payer: Self-pay | Admitting: Family Medicine

## 2020-04-25 VITALS — BP 130/68 | HR 69 | Temp 98.4°F | Resp 15 | Ht 63.0 in | Wt 124.1 lb

## 2020-04-25 DIAGNOSIS — K219 Gastro-esophageal reflux disease without esophagitis: Secondary | ICD-10-CM | POA: Diagnosis not present

## 2020-04-25 DIAGNOSIS — F17210 Nicotine dependence, cigarettes, uncomplicated: Secondary | ICD-10-CM | POA: Diagnosis not present

## 2020-04-25 DIAGNOSIS — C3431 Malignant neoplasm of lower lobe, right bronchus or lung: Secondary | ICD-10-CM

## 2020-04-25 DIAGNOSIS — J432 Centrilobular emphysema: Secondary | ICD-10-CM | POA: Diagnosis not present

## 2020-04-25 DIAGNOSIS — E785 Hyperlipidemia, unspecified: Secondary | ICD-10-CM | POA: Diagnosis not present

## 2020-04-25 DIAGNOSIS — Z1231 Encounter for screening mammogram for malignant neoplasm of breast: Secondary | ICD-10-CM

## 2020-04-25 DIAGNOSIS — I10 Essential (primary) hypertension: Secondary | ICD-10-CM | POA: Diagnosis not present

## 2020-04-25 DIAGNOSIS — R0902 Hypoxemia: Secondary | ICD-10-CM | POA: Diagnosis not present

## 2020-04-25 DIAGNOSIS — F17200 Nicotine dependence, unspecified, uncomplicated: Secondary | ICD-10-CM

## 2020-04-25 DIAGNOSIS — M19079 Primary osteoarthritis, unspecified ankle and foot: Secondary | ICD-10-CM

## 2020-04-25 DIAGNOSIS — Z9981 Dependence on supplemental oxygen: Secondary | ICD-10-CM

## 2020-04-25 NOTE — Patient Instructions (Signed)
Annual physical exam with MD in 4 months call if you need me sooner.  You are referred to pulmonary specialist as discussed.  You are referred for testing for oxygen needs overnight so that we can recertify your nocturnal oxygen.  We will try to schedule your chest scan for August 2 so that this will be ready for your visit with your oncologist later that week.( this was orderd [place])  Please continue to work on cutting back on cigarettes as you know quitting will improve your health and reduce your risk of recurrent cancer as well as heart disease.  Very thankful that you are doing very well and happy for the good things you have received.  Please send copies of your most recent labs to include your cholesterol thyroid function and vitamin D levels.  Thanks for choosing [facility], we consider it a privelige to serve you.

## 2020-04-25 NOTE — Progress Notes (Signed)
[name]     MRN: [id number]      DOB: January 04, 1951   HPI [name] is here for follow up and re-evaluation of chronic medical conditions, medication management and review of any available recent lab and radiology data.  Preventive health is updated, specifically  Cancer screening and Immunization.   Questions or concerns regarding consultations or procedures which the PT has had in the interim are  addressed. Patient states she needs re evaluation for overnight oxygen need will refer  Needs to sere pulmonary Dr to eval capacity to use breath analyser ( interlock) since had DUI in the past 21 years ago, and states this is a requirement to have in her car to be able to drive   Needs appt for chest scan aim for 8/2 early am    Currently smokes 1 PPD, cant set quit date yet, though states she wants to quit, just not currently ableROS Denies recent fever or chills. Denies sinus pressure, nasal congestion, ear pain or sore throat. Denies chest congestion, productive cough or wheezing. Denies chest pains, palpitations and leg swelling Denies abdominal pain, nausea, vomiting,diarrhea or constipation.   Denies dysuria, frequency, hesitancy or incontinence. Denies joint pain, swelling and limitation in mobility. Denies headaches, seizures, numbness, or tingling. Denies depression, anxiety or insomnia. Denies skin break down or rash.   PE  BP 130/68   Pulse 69   Temp 98.4 F (36.9 C) (Temporal)   Resp 15   Ht 5\' 3"  (1.6 m)   Wt 124 lb 1.3 oz (56.3 kg)   SpO2 95%   BMI 21.98 kg/m    Patient alert and oriented and in no cardiopulmonary distress.  HEENT: No facial asymmetry, EOMI,     Neck supple .  Chest: Clear to auscultation bilaterally.decreased air entry thrpoughout CVS: S1, S2 no murmurs, no S3.Regular rate.  ABD: Soft non tender.   Ext: No edema  MS: Adequate ROM spine, shoulders, hips and knees.  Skin: Intact, no ulcerations or rash noted.  Psych: Good eye  contact, normal affect. Memory intact not anxious or depressed appearing.  CNS: CN 2-12 intact, power,  normal throughout.no focal deficits noted.   Assessment & Plan  Centrilobular emphysema Pt with COPD, emphysema and ongoing nicotine use, needs to identify pulmonary Specialist for assistance with evaluating her capacity ot use a brath analyser required for her to be able to drive again, will refer to local MD  Essential hypertension Controlled, no change in medication   Lung cancer Followed by oncology, unfortunately still smoking 1 PPD and unable to set a quit date at this time Will try to request scan to be done prior to her appt with Dr Julien Nordmann  Hypoxemia requiring supplemental oxygen Has established nocturnal hypoxia, and has ongoing nicotine use, needs updated tets to re certify, referral is made  Arthropathy of ankle and foot chronica pain management through pain  clinic  NICOTINE ADDICTION Asked:confirms currently smokes cigarettes 1 PPD Assess: Unwilling to quit but trying to cut back Advise: needs to QUIT to reduce risk of cancer, cardio and cerebrovascular disease Assist: counseled for 5 minutes and literature provided Arrange: follow up in 3 months   Hyperlipemia Hyperlipidemia:Low fat diet discussed and encouraged.   Lipid Panel  Lab Results  Component Value Date   CHOL 180 04/19/2018   HDL 57 04/19/2018   LDLCALC 104 (H) 04/19/2018   TRIG 97 04/19/2018   CHOLHDL 3.2 04/19/2018   Need to reduce fat in diet  GERD Controlled, no change in medication

## 2020-04-27 ENCOUNTER — Encounter: Payer: Self-pay | Admitting: Family Medicine

## 2020-04-29 ENCOUNTER — Encounter: Payer: Self-pay | Admitting: Family Medicine

## 2020-04-29 NOTE — Assessment & Plan Note (Signed)
Hyperlipidemia:Low fat diet discussed and encouraged.   Lipid Panel  Lab Results  Component Value Date   CHOL 180 04/19/2018   HDL 57 04/19/2018   LDLCALC 104 (H) 04/19/2018   TRIG 97 04/19/2018   CHOLHDL 3.2 04/19/2018   Need to reduce fat in diet

## 2020-04-29 NOTE — Assessment & Plan Note (Signed)
Has established nocturnal hypoxia, and has ongoing nicotine use, needs updated tets to re certify, referral is made

## 2020-04-29 NOTE — Assessment & Plan Note (Signed)
Controlled, no change in medication  

## 2020-04-29 NOTE — Assessment & Plan Note (Signed)
chronica pain management through pain  clinic

## 2020-04-29 NOTE — Assessment & Plan Note (Signed)
Followed by oncology, unfortunately still smoking 1 PPD and unable to set a quit date at this time Will try to request scan to be done prior to her appt with Dr Julien Nordmann

## 2020-04-29 NOTE — Assessment & Plan Note (Signed)
Asked:confirms currently smokes cigarettes 1 PPD Assess: Unwilling to quit but trying to cut back Advise: needs to QUIT to reduce risk of cancer, cardio and cerebrovascular disease Assist: counseled for 5 minutes and literature provided Arrange: follow up in 3 months

## 2020-04-29 NOTE — Assessment & Plan Note (Signed)
Pt with COPD, emphysema and ongoing nicotine use, needs to identify pulmonary Specialist for assistance with evaluating her capacity ot use a brath analyser required for her to be able to drive again, will refer to local MD

## 2020-05-01 ENCOUNTER — Other Ambulatory Visit: Payer: Self-pay

## 2020-05-01 DIAGNOSIS — J432 Centrilobular emphysema: Secondary | ICD-10-CM

## 2020-05-02 DIAGNOSIS — Z79899 Other long term (current) drug therapy: Secondary | ICD-10-CM | POA: Diagnosis not present

## 2020-05-02 DIAGNOSIS — G8929 Other chronic pain: Secondary | ICD-10-CM | POA: Diagnosis not present

## 2020-05-02 DIAGNOSIS — F1020 Alcohol dependence, uncomplicated: Secondary | ICD-10-CM | POA: Diagnosis not present

## 2020-05-02 DIAGNOSIS — M19071 Primary osteoarthritis, right ankle and foot: Secondary | ICD-10-CM | POA: Diagnosis not present

## 2020-05-02 DIAGNOSIS — M25571 Pain in right ankle and joints of right foot: Secondary | ICD-10-CM | POA: Diagnosis not present

## 2020-05-08 DIAGNOSIS — J432 Centrilobular emphysema: Secondary | ICD-10-CM | POA: Diagnosis not present

## 2020-05-16 DIAGNOSIS — J449 Chronic obstructive pulmonary disease, unspecified: Secondary | ICD-10-CM | POA: Diagnosis not present

## 2020-05-16 DIAGNOSIS — J432 Centrilobular emphysema: Secondary | ICD-10-CM | POA: Diagnosis not present

## 2020-05-17 NOTE — Telephone Encounter (Signed)
Pt said [facility] did her oxygen blood test overnight. How would we get these records?

## 2020-05-18 ENCOUNTER — Other Ambulatory Visit: Payer: Self-pay

## 2020-05-18 ENCOUNTER — Ambulatory Visit
Admission: RE | Admit: 2020-05-18 | Discharge: 2020-05-18 | Disposition: A | Discharge status: Home / Self Care | Payer: Medicare HMO | Source: Ambulatory Visit | Attending: Family Medicine | Admitting: Family Medicine

## 2020-05-18 DIAGNOSIS — Z1231 Encounter for screening mammogram for malignant neoplasm of breast: Secondary | ICD-10-CM | POA: Diagnosis not present

## 2020-05-21 NOTE — Telephone Encounter (Signed)
Overnight test results came back and sent for scanning. They are not yet in the chart.

## 2020-05-22 ENCOUNTER — Encounter: Payer: Self-pay | Admitting: Family Medicine

## 2020-05-23 ENCOUNTER — Ambulatory Visit: Payer: Medicare HMO | Admitting: Family Medicine

## 2020-05-30 DIAGNOSIS — G8929 Other chronic pain: Secondary | ICD-10-CM | POA: Diagnosis not present

## 2020-05-30 DIAGNOSIS — Z79899 Other long term (current) drug therapy: Secondary | ICD-10-CM | POA: Diagnosis not present

## 2020-05-30 DIAGNOSIS — G629 Polyneuropathy, unspecified: Secondary | ICD-10-CM | POA: Diagnosis not present

## 2020-05-30 DIAGNOSIS — M25571 Pain in right ankle and joints of right foot: Secondary | ICD-10-CM | POA: Diagnosis not present

## 2020-05-30 DIAGNOSIS — M19071 Primary osteoarthritis, right ankle and foot: Secondary | ICD-10-CM | POA: Diagnosis not present

## 2020-06-08 DIAGNOSIS — R69 Illness, unspecified: Secondary | ICD-10-CM | POA: Diagnosis not present

## 2020-06-12 ENCOUNTER — Inpatient Hospital Stay: Payer: Medicare HMO | Attending: Internal Medicine

## 2020-06-12 ENCOUNTER — Other Ambulatory Visit: Payer: Self-pay

## 2020-06-12 ENCOUNTER — Ambulatory Visit (HOSPITAL_COMMUNITY)
Admission: RE | Admit: 2020-06-12 | Discharge: 2020-06-12 | Disposition: A | Discharge status: Home / Self Care | Payer: Medicare HMO | Source: Ambulatory Visit | Attending: Internal Medicine | Admitting: Internal Medicine

## 2020-06-12 ENCOUNTER — Other Ambulatory Visit (HOSPITAL_COMMUNITY)
Admission: RE | Admit: 2020-06-12 | Discharge: 2020-06-12 | Disposition: A | Discharge status: Home / Self Care | Payer: Medicare HMO | Source: Ambulatory Visit | Attending: Internal Medicine | Admitting: Internal Medicine

## 2020-06-12 ENCOUNTER — Ambulatory Visit (HOSPITAL_COMMUNITY): Payer: Medicare HMO

## 2020-06-12 ENCOUNTER — Inpatient Hospital Stay: Payer: Medicare HMO

## 2020-06-12 DIAGNOSIS — J449 Chronic obstructive pulmonary disease, unspecified: Secondary | ICD-10-CM | POA: Insufficient documentation

## 2020-06-12 DIAGNOSIS — Z85118 Personal history of other malignant neoplasm of bronchus and lung: Secondary | ICD-10-CM | POA: Insufficient documentation

## 2020-06-12 DIAGNOSIS — I10 Essential (primary) hypertension: Secondary | ICD-10-CM | POA: Insufficient documentation

## 2020-06-12 DIAGNOSIS — B1920 Unspecified viral hepatitis C without hepatic coma: Secondary | ICD-10-CM | POA: Insufficient documentation

## 2020-06-12 DIAGNOSIS — J432 Centrilobular emphysema: Secondary | ICD-10-CM | POA: Diagnosis not present

## 2020-06-12 DIAGNOSIS — J984 Other disorders of lung: Secondary | ICD-10-CM | POA: Diagnosis not present

## 2020-06-12 DIAGNOSIS — C3490 Malignant neoplasm of unspecified part of unspecified bronchus or lung: Secondary | ICD-10-CM | POA: Insufficient documentation

## 2020-06-12 LAB — CBC WITH DIFFERENTIAL/PLATELET
Abs Immature Granulocytes: 0.04 10*3/uL (ref 0.00–0.07)
Basophils Absolute: 0 10*3/uL (ref 0.0–0.1)
Basophils Relative: 0 %
Eosinophils Absolute: 0.2 10*3/uL (ref 0.0–0.5)
Eosinophils Relative: 2 %
HCT: 42.8 % (ref 36.0–46.0)
Hemoglobin: 13.9 g/dL (ref 12.0–15.0)
Immature Granulocytes: 0 %
Lymphocytes Relative: 21 %
Lymphs Abs: 2.1 10*3/uL (ref 0.7–4.0)
MCH: 31.7 pg (ref 26.0–34.0)
MCHC: 32.5 g/dL (ref 30.0–36.0)
MCV: 97.5 fL (ref 80.0–100.0)
Monocytes Absolute: 1 10*3/uL (ref 0.1–1.0)
Monocytes Relative: 9 %
Neutro Abs: 6.9 10*3/uL (ref 1.7–7.7)
Neutrophils Relative %: 68 %
Platelets: 244 10*3/uL (ref 150–400)
RBC: 4.39 MIL/uL (ref 3.87–5.11)
RDW: 13.3 % (ref 11.5–15.5)
WBC: 10.3 10*3/uL (ref 4.0–10.5)
nRBC: 0 % (ref 0.0–0.2)

## 2020-06-12 LAB — COMPREHENSIVE METABOLIC PANEL
ALT: 21 U/L (ref 0–44)
AST: 23 U/L (ref 15–41)
Albumin: 4.6 g/dL (ref 3.5–5.0)
Alkaline Phosphatase: 56 U/L (ref 38–126)
Anion gap: 13 (ref 5–15)
BUN: 11 mg/dL (ref 8–23)
CO2: 26 mmol/L (ref 22–32)
Calcium: 9.3 mg/dL (ref 8.9–10.3)
Chloride: 99 mmol/L (ref 98–111)
Creatinine, Ser: 0.63 mg/dL (ref 0.44–1.00)
GFR calc Af Amer: >60 mL/min (ref >60)
GFR calc non Af Amer: >60 mL/min (ref >60)
Glucose, Bld: 105 mg/dL — ABNORMAL HIGH (ref 70–99)
Potassium: 4 mmol/L (ref 3.5–5.1)
Sodium: 138 mmol/L (ref 135–145)
Total Bilirubin: 0.6 mg/dL (ref 0.3–1.2)
Total Protein: 7.2 g/dL (ref 6.5–8.1)

## 2020-06-12 MED ORDER — IOHEXOL 300 MG/ML  SOLN
75.0000 mL | Freq: Once | INTRAMUSCULAR | Status: AC | PRN
  Administered 2020-06-12: 75 mL via INTRAVENOUS

## 2020-06-14 ENCOUNTER — Other Ambulatory Visit: Payer: Self-pay | Admitting: Internal Medicine

## 2020-06-14 ENCOUNTER — Inpatient Hospital Stay (HOSPITAL_BASED_OUTPATIENT_CLINIC_OR_DEPARTMENT_OTHER): Payer: Medicare HMO | Admitting: Internal Medicine

## 2020-06-14 ENCOUNTER — Telehealth: Payer: Self-pay | Admitting: Internal Medicine

## 2020-06-14 ENCOUNTER — Other Ambulatory Visit: Payer: Self-pay

## 2020-06-14 ENCOUNTER — Encounter: Payer: Self-pay | Admitting: Internal Medicine

## 2020-06-14 DIAGNOSIS — J449 Chronic obstructive pulmonary disease, unspecified: Secondary | ICD-10-CM | POA: Diagnosis not present

## 2020-06-14 DIAGNOSIS — C3490 Malignant neoplasm of unspecified part of unspecified bronchus or lung: Secondary | ICD-10-CM

## 2020-06-14 DIAGNOSIS — Z85118 Personal history of other malignant neoplasm of bronchus and lung: Secondary | ICD-10-CM | POA: Diagnosis not present

## 2020-06-14 DIAGNOSIS — B1920 Unspecified viral hepatitis C without hepatic coma: Secondary | ICD-10-CM | POA: Diagnosis not present

## 2020-06-14 DIAGNOSIS — I10 Essential (primary) hypertension: Secondary | ICD-10-CM | POA: Diagnosis not present

## 2020-06-14 NOTE — Telephone Encounter (Signed)
Im not sure what she is asking about

## 2020-06-14 NOTE — Patient Instructions (Signed)
Steps to Quit Smoking Smoking tobacco is the leading cause of preventable death. It can affect almost every organ in the body. Smoking puts you and people around you at risk for many serious, long-lasting (chronic) diseases. Quitting smoking can be hard, but it is one of the best things that you can do for your health. It is never too late to quit. How do I get ready to quit? When you decide to quit smoking, make a plan to help you succeed. Before you quit:  Pick a date to quit. Set a date within the next 2 weeks to give you time to prepare.  Write down the reasons why you are quitting. Keep this list in places where you will see it often.  Tell your family, friends, and co-workers that you are quitting. Their support is important.  Talk with your doctor about the choices that may help you quit.  Find out if your health insurance will pay for these treatments.  Know the people, places, things, and activities that make you want to smoke (triggers). Avoid them. What first steps can I take to quit smoking?  Throw away all cigarettes at home, at work, and in your car.  Throw away the things that you use when you smoke, such as ashtrays and lighters.  Clean your car. Make sure to empty the ashtray.  Clean your home, including curtains and carpets. What can I do to help me quit smoking? Talk with your doctor about taking medicines and seeing a counselor at the same time. You are more likely to succeed when you do both.  If you are pregnant or breastfeeding, talk with your doctor about counseling or other ways to quit smoking. Do not take medicine to help you quit smoking unless your doctor tells you to do so. To quit smoking: Quit right away  Quit smoking totally, instead of slowly cutting back on how much you smoke over a period of time.  Go to counseling. You are more likely to quit if you go to counseling sessions regularly. Take medicine You may take medicines to help you quit. Some  medicines need a prescription, and some you can buy over-the-counter. Some medicines may contain a drug called nicotine to replace the nicotine in cigarettes. Medicines may:  Help you to stop having the desire to smoke (cravings).  Help to stop the problems that come when you stop smoking (withdrawal symptoms). Your doctor may ask you to use:  Nicotine patches, gum, or lozenges.  Nicotine inhalers or sprays.  Non-nicotine medicine that is taken by mouth. Find resources Find resources and other ways to help you quit smoking and remain smoke-free after you quit. These resources are most helpful when you use them often. They include:  Online chats with a counselor.  Phone quitlines.  Printed self-help materials.  Support groups or group counseling.  Text messaging programs.  Mobile phone apps. Use apps on your mobile phone or tablet that can help you stick to your quit plan. There are many free apps for mobile phones and tablets as well as websites. Examples include Quit Guide from the CDC and smokefree.gov  What things can I do to make it easier to quit?   Talk to your family and friends. Ask them to support and encourage you.  Call a phone quitline (1-800-QUIT-NOW), reach out to support groups, or work with a counselor.  Ask people who smoke to not smoke around you.  Avoid places that make you want to smoke,   such as: ? Bars. ? Parties. ? Smoke-break areas at work.  Spend time with people who do not smoke.  Lower the stress in your life. Stress can make you want to smoke. Try these things to help your stress: ? Getting regular exercise. ? Doing deep-breathing exercises. ? Doing yoga. ? Meditating. ? Doing a body scan. To do this, close your eyes, focus on one area of your body at a time from head to toe. Notice which parts of your body are tense. Try to relax the muscles in those areas. How will I feel when I quit smoking? Day 1 to 3 weeks Within the first 24 hours,  you may start to have some problems that come from quitting tobacco. These problems are very bad 2-3 days after you quit, but they do not often last for more than 2-3 weeks. You may get these symptoms:  Mood swings.  Feeling restless, nervous, angry, or annoyed.  Trouble concentrating.  Dizziness.  Strong desire for high-sugar foods and nicotine.  Weight gain.  Trouble pooping (constipation).  Feeling like you may vomit (nausea).  Coughing or a sore throat.  Changes in how the medicines that you take for other issues work in your body.  Depression.  Trouble sleeping (insomnia). Week 3 and afterward After the first 2-3 weeks of quitting, you may start to notice more positive results, such as:  Better sense of smell and taste.  Less coughing and sore throat.  Slower heart rate.  Lower blood pressure.  Clearer skin.  Better breathing.  Fewer sick days. Quitting smoking can be hard. Do not give up if you fail the first time. Some people need to try a few times before they succeed. Do your best to stick to your quit plan, and talk with your doctor if you have any questions or concerns. Summary  Smoking tobacco is the leading cause of preventable death. Quitting smoking can be hard, but it is one of the best things that you can do for your health.  When you decide to quit smoking, make a plan to help you succeed.  Quit smoking right away, not slowly over a period of time.  When you start quitting, seek help from your doctor, family, or friends. This information is not intended to replace advice given to you by your health care provider. Make sure you discuss any questions you have with your health care provider. Document Revised: 07/22/2019 Document Reviewed: 01/15/2019 Elsevier Patient Education  2020 Elsevier Inc.  

## 2020-06-14 NOTE — Telephone Encounter (Signed)
Scheduled per los. Sent MD and nurse msg per patient request for labs and CT to be done at [facility]. Gave avs and calendar
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
## 2020-06-16 DIAGNOSIS — J432 Centrilobular emphysema: Secondary | ICD-10-CM | POA: Diagnosis not present

## 2020-06-16 DIAGNOSIS — J449 Chronic obstructive pulmonary disease, unspecified: Secondary | ICD-10-CM | POA: Diagnosis not present

## 2020-06-19 ENCOUNTER — Other Ambulatory Visit: Payer: Self-pay

## 2020-06-19 ENCOUNTER — Ambulatory Visit (INDEPENDENT_AMBULATORY_CARE_PROVIDER_SITE_OTHER): Payer: Medicare HMO | Admitting: Internal Medicine

## 2020-06-19 ENCOUNTER — Other Ambulatory Visit: Payer: Self-pay | Admitting: Medical Oncology

## 2020-06-19 ENCOUNTER — Encounter: Payer: Self-pay | Admitting: Internal Medicine

## 2020-06-19 DIAGNOSIS — J449 Chronic obstructive pulmonary disease, unspecified: Secondary | ICD-10-CM

## 2020-06-19 DIAGNOSIS — R0600 Dyspnea, unspecified: Secondary | ICD-10-CM

## 2020-06-19 DIAGNOSIS — C3490 Malignant neoplasm of unspecified part of unspecified bronchus or lung: Secondary | ICD-10-CM

## 2020-06-19 DIAGNOSIS — Z9981 Dependence on supplemental oxygen: Secondary | ICD-10-CM

## 2020-06-19 DIAGNOSIS — R0902 Hypoxemia: Secondary | ICD-10-CM

## 2020-06-19 DIAGNOSIS — R0609 Other forms of dyspnea: Secondary | ICD-10-CM

## 2020-06-19 DIAGNOSIS — F17210 Nicotine dependence, cigarettes, uncomplicated: Secondary | ICD-10-CM | POA: Diagnosis not present

## 2020-06-19 NOTE — Progress Notes (Addendum)
[name], female    DOB: 1951/04/20,    MRN: [id number]   Brief patient profile:  69 yowf active smoker healthy until  2016 incidental  RLL nodule   DIAGNOSIS:  Stage IA (T1a, N0, M0) non-small cell lung cancer, moderately differentiated squamous cell carcinoma diagnosed in July 2016  PRIOR THERAPY: Right VATS with right lower lobectomy with lymph node dissection under the care of Amanda. Servando Jones on 07/25/2015    History of Present Illness  06/19/2020  Pulmonary/ 1st office eval/ [name] / [name]  Chief Complaint  Patient presents with  . Consult    Shortness of breath with exertion  Dyspnea:  50 -100 ft (not verified on 600 ft walk on day of ov)  Cough: immediately lying down and in am clear x rattling  Sleep: on side one pillow  SABA use: none / "spiriva hurt my lungs" 02  2lpm hs only   No obvious day to day or daytime variability or assoc   purulent sputum or mucus plugs or hemoptysis or cp or chest tightness, subjective wheeze or overt sinus or hb symptoms.    Also denies any obvious fluctuation of symptoms with weather or environmental changes or other aggravating or alleviating factors except as outlined above   No unusual exposure hx or h/o childhood pna/ asthma or knowledge of premature birth.  Current Allergies, Complete Past Medical History, Past Surgical History, Family History, and Social History were reviewed in [employer] record.  ROS  The following are not active complaints unless bolded Hoarseness, sore throat, dysphagia, dental problems, itching, sneezing,  nasal congestion or discharge of excess mucus or purulent secretions, ear ache,   fever, chills, sweats, unintended wt loss or wt gain, classically pleuritic or exertional cp,  orthopnea pnd or arm/hand swelling  or leg swelling, presyncope, palpitations, abdominal pain, anorexia, nausea, vomiting, diarrhea  or change in bowel habits or change in bladder habits, change in  stools or change in urine, dysuria, hematuria,  rash, arthralgias, visual complaints, headache, numbness, weakness or ataxia or problems with walking or coordination,  change in mood or  memory.             Past Medical History:  Diagnosis Date  . Acute GI bleeding 01/28/2012  . Anemia due to blood loss, acute 01/28/2012  . 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 Arthritis    "qwhere; hands, feet, overall stiffness" (10/20/2013)  . Cancer ([place])    lung cancer  . Chronic bronchitis ([place])   . Chronic lower back pain   . Complication of anesthesia    "lungs quit working during [place] in [place]" (10/20/2013); pt. states that she can't breathe after surgery when laying on back  . COPD (chronic obstructive pulmonary disease) ([place])   . Coronary artery disease   . Daily headache    Patient stated they are felt in back of the head, not throbing. But always in same spot. MRI's done, no reason why they occur. (10/20/2013)  . Depression   . Diastolic dysfunction 02/16/8118   Grade 1  . Diverticulitis    pt reports 8 times. [name] colectomy in 2009  . Diverticulosis 2008   diagnosed; pt. states now cured 07/19/15  . Dysrhythmia    pt. states not since ablation..history of Supraventricular tachycardia  . Fibromyalgia   . Fracture 2006   left foot & ankle , immobilized for healing   . Gout  Recently diagnosed.  Marland Kitchen HCV antibody positive   . HEARING LOSS    since age 35  . Hepatitis C 1993    Needs Hepatic panel every 6   months, treated for 1 year   . Hiatal hernia    "repaired"   . History of blood transfusion    "probably when I was young, when I was 15" (10/20/2013)  . History of pneumonia   . Hyperlipidemia 2001  . Hypertension 2001  . Menopause    per medical history form  . Night sweats    Per medical history form dated 05/02/11.  . On home oxygen therapy    "2L only at night" (10/20/2013); pt. currently not wearing O2 at night (07/19/15)  . Panic  disorder    was followed by mental health  . Sleep apnea 2001   non compliant wit the use of the machine  . Sleep apnea    wear oxygen at bedtime.   . SOB (shortness of breath)    "after lying in bed, go to the bathroom; heart races & I'm SOB" (10/20/2013)  . SVT (supraventricular tachycardia) ([place])    s/p ablation 10-20-2013 by [name]  . Tinnitus 2006   disabling  . Wears dentures    Per medical history form dated 05/02/11.  . Wears glasses     Outpatient Medications Prior to Visit  Medication Sig Dispense Refill  . carisoprodol (SOMA) 350 MG tablet Take 350 mg by mouth at bedtime.     . cetirizine (ZYRTEC) 10 MG tablet Take 1 tablet (10 mg total) daily by mouth. (Patient taking differently: Take 10 mg by mouth daily as needed for allergies. ) 90 tablet 3  . clopidogrel (PLAVIX) 75 MG tablet Take 37.5 mg by mouth daily.    Marland Kitchen doxepin (SINEQUAN) 10 MG capsule Take 10 mg by mouth at bedtime.    Marland Kitchen EPINEPHrine 0.3 mg/0.3 mL IJ SOAJ injection Inject 0.3 mg into the muscle as needed.     Marland Kitchen erythromycin ophthalmic ointment Place 1 application into both eyes at bedtime.     . flecainide (TAMBOCOR) 150 MG tablet Take 75 mg by mouth 2 (two) times daily.     Marland Kitchen HYDROcodone-acetaminophen (NORCO) 10-325 MG tablet Take 1 tablet by mouth 4 (four) times daily.    . metoprolol succinate (TOPROL-XL) 25 MG 24 hr tablet Take 12.5 mg by mouth every evening.     . pantoprazole (PROTONIX) 40 MG tablet Take 40 mg by mouth daily.    . rosuvastatin (CRESTOR) 20 MG tablet Take 20 mg by mouth daily.     No facility-administered medications prior to visit.     Objective:     BP (!) 122/58 (BP Location: Left Arm, Cuff Size: Normal)   Pulse (!) 56   Temp 97.6 F (36.4 C) (Oral)   Ht 5\' 3"  (1.6 m)   Wt 123 lb 6.4 oz (56 kg)   SpO2 97% Comment: Room air  BMI 21.86 kg/m   SpO2: 97 % (Room air)   amb wf / rattling cough    HEENT : pt wearing mask not removed for exam due to covid - 19 concerns.    NECK :  without JVD/Nodes/TM/ nl carotid upstrokes bilaterally   LUNGS: no acc muscle use,  Min barrel  contour chest wall with bilateral  slightly decreased bs s audible wheeze and  without cough on insp or exp maneuvers and min  Hyperresonant  to  percussion bilaterally  CV:  RRR  no s3 or murmur or increase in P2, and no edema   ABD:  soft and nontender with pos end  insp Hoover's  in the supine position. No bruits or organomegaly appreciated, bowel sounds nl  MS:   Nl gait/  ext warm without deformities, calf tenderness, cyanosis or clubbing No obvious joint restrictions   SKIN: warm and dry without lesions    NEURO:  alert, approp, nl sensorium with  no motor or cerebellar deficits apparent.         I personally reviewed images and agree with radiology impression as follows:  Chest CT w contrast     06/12/20  Status post right lower lobectomy. No evidence of recurrent or metastatic disease. Aortic Atherosclerosis (ICD10-I70.0) and Emphysema (ICD10-J43.9).     Assessment   COPD GOLD 1  Active smoker - s/p RLLobectomy 2016 - Spirometry 09/08/17   FEV1 1.98 (98%)  Ratio 0.69  With minimal curvature  -  06/19/2020   Walked RA  approx   600 ft  @ fast pace  stopped due to  End of study, slt dizzy at end s sob and sats 96%     She has more of a CB pattern than emphysema, not limited by airflow obstruction or tendency to aecopd with 30 medication allergies and did not tol spiriva, no better on laba/ics prior so main focus will be on smoking cessation - (see separate a/p)   In terms of ignition lock:  Per the DMV guidelines for the interlock system: in order to accommodate users who have been medically determined by [place] under G.S. 20-17.8 and pursuant to pulmonary function testing (PFT) to be physically incapable of normal exhalation values for a person of similar age, gender, body height and size, and ethnicity/race, be configured with the capability of permanently  adjusting breath blow pressure or air volume to a Forced Vital Capacity (FVC) at least as low as 1.2 liters;    >>>>  Will check pfts and f/u prn      DOE (dyspnea on exertion) Not verified on today's eval, pfts ordered.         Hypoxemia requiring supplemental oxygen ono RA done 05/02/20  desat < 89% x 287 min > rx 2lpm per Amanda Moshe Cipro - did not desaturate 06/19/2020 on 600 ft rapid walk   Most likely noct desats related to mucus plugging from CB in absence of ex hypoxemia > reviewed nl mucociliary function and effects of cigs on this explaining her problem of noct cough and am congestion.    Cigarette smoker 3 min discussion re active cigarette smoking in addition to office E&M  Ask about tobacco use:   Ongoing  Advise quitting   > 3 min discussion I reviewed the Fletcher curve with the patient that basically indicates  if you quit smoking when your best day FEV1 is still well preserved (as is clearly  the case here)  it is highly unlikely you will progress to severe disease and informed the patient there was  no medication on the market that has proven to alter the curve/ its downward trajectory  or the likelihood of progression of their disease(unlike other chronic medical conditions such as atheroclerosis where we do think we can change the natural hx with risk reducing meds)    Therefore stopping smoking and maintaining abstinence are  the most important aspects of care, not choice of inhalers or for that matter, doctors.   Treatment other than smoking  cessation  is entirely directed by severity of symptoms and focused also on reducing exacerbations, not attempting to change the natural history of the disease.   Assess willingness:  Not committed at this point Assist in quit attempt:  Per PCP when ready Arrange follow up:   Follow up per Primary Care planned       Each maintenance medication was reviewed in detail including emphasizing most importantly the difference between  maintenance and prns and under what circumstances the prns are to be triggered using an action plan format where appropriate.  Total time for H and P, chart review, counseling,  directly observing portions of ambulatory 02 saturation study/  and generating customized AVS unique to this office visit / charting = 61 min           Christinia Gully, MD 06/19/2020

## 2020-06-19 NOTE — Patient Instructions (Signed)
The key is to stop smoking completely before smoking completely stops you - good luck!  I will document your problems with the safety lock to support your claim you have trouble using it.  No need for medications for now  We will schedule you today for the first available PFTs and call you with the results

## 2020-06-20 ENCOUNTER — Encounter: Payer: Self-pay | Admitting: Internal Medicine

## 2020-06-20 DIAGNOSIS — F17210 Nicotine dependence, cigarettes, uncomplicated: Secondary | ICD-10-CM | POA: Insufficient documentation

## 2020-06-20 NOTE — Assessment & Plan Note (Addendum)
3 min discussion re active cigarette smoking in addition to office E&M  Ask about tobacco use:   Ongoing  Advise quitting   > 3 min discussion I reviewed the Fletcher curve with the patient that basically indicates  if you quit smoking when your best day FEV1 is still well preserved (as is clearly  the case here)  it is highly unlikely you will progress to severe disease and informed the patient there was  no medication on the market that has proven to alter the curve/ its downward trajectory  or the likelihood of progression of their disease(unlike other chronic medical conditions such as atheroclerosis where we do think we can change the natural hx with risk reducing meds)    Therefore stopping smoking and maintaining abstinence are  the most important aspects of care, not choice of inhalers or for that matter, doctors.   Treatment other than smoking cessation  is entirely directed by severity of symptoms and focused also on reducing exacerbations, not attempting to change the natural history of the disease.   Assess willingness:  Not committed at this point Assist in quit attempt:  Per PCP when ready Arrange follow up:   Follow up per Primary Care planned

## 2020-06-20 NOTE — Assessment & Plan Note (Addendum)
Active smoker - s/p RLLobectomy 2016 - Spirometry 09/08/17   FEV1 1.98 (98%)  Ratio 0.69  With minimal curvature  -  06/19/2020   Walked RA  approx   600 ft  @ fast pace  stopped due to  End of study, slt dizzy at end s sob and sats 96%     She has more of a CB pattern than emphysema, not limited by airflow obstruction or tendency to aecopd with 30 medication allergies and did not tol spiriva, no better on laba/ics prior so main focus will be on smoking cessation - (see separate a/p)   In terms of ignition lock:  Per the DMV guidelines for the interlock system: in order to accommodate users who have been medically determined by [place] under G.S. 20-17.8 and pursuant to pulmonary function testing (PFT) to be physically incapable of normal exhalation values for a person of similar age, gender, body height and size, and ethnicity/race, be configured with the capability of permanently adjusting breath blow pressure or air volume to a Forced Vital Capacity (FVC) at least as low as 1.2 liters;    Will check pfts and f/u prn

## 2020-06-20 NOTE — Assessment & Plan Note (Signed)
ono RA done 05/02/20  desat < 89% x 287 min > rx 2lpm per dr Moshe Cipro - did not desaturate 06/19/2020 on 600 ft rapid walk   Most likely noct desats related to mucus plugging from CB in absence of ex hypoxemia > reviewed nl mucociliary function and effects of cigs on this explaining her problem of noct cough and am congestion.

## 2020-06-20 NOTE — Assessment & Plan Note (Signed)
Not verified on today's eval, pfts ordered.          Each maintenance medication was reviewed in detail including emphasizing most importantly the difference between maintenance and prns and under what circumstances the prns are to be triggered using an action plan format where appropriate.  Total time for H and P, chart review, counseling,  directly observing portions of ambulatory 02 saturation study/  and generating customized AVS unique to this office visit / charting = 61 min

## 2020-06-27 DIAGNOSIS — G8929 Other chronic pain: Secondary | ICD-10-CM | POA: Diagnosis not present

## 2020-06-27 DIAGNOSIS — G629 Polyneuropathy, unspecified: Secondary | ICD-10-CM | POA: Diagnosis not present

## 2020-06-27 DIAGNOSIS — M25571 Pain in right ankle and joints of right foot: Secondary | ICD-10-CM | POA: Diagnosis not present

## 2020-06-27 DIAGNOSIS — M19071 Primary osteoarthritis, right ankle and foot: Secondary | ICD-10-CM | POA: Diagnosis not present

## 2020-06-27 DIAGNOSIS — Z79899 Other long term (current) drug therapy: Secondary | ICD-10-CM | POA: Diagnosis not present

## 2020-07-17 DIAGNOSIS — J449 Chronic obstructive pulmonary disease, unspecified: Secondary | ICD-10-CM | POA: Diagnosis not present

## 2020-07-17 DIAGNOSIS — J432 Centrilobular emphysema: Secondary | ICD-10-CM | POA: Diagnosis not present

## 2020-07-20 ENCOUNTER — Ambulatory Visit (INDEPENDENT_AMBULATORY_CARE_PROVIDER_SITE_OTHER): Payer: Medicare HMO | Admitting: Internal Medicine

## 2020-07-20 ENCOUNTER — Other Ambulatory Visit: Payer: Self-pay

## 2020-07-20 DIAGNOSIS — R0600 Dyspnea, unspecified: Secondary | ICD-10-CM | POA: Diagnosis not present

## 2020-07-20 DIAGNOSIS — R0609 Other forms of dyspnea: Secondary | ICD-10-CM

## 2020-07-20 LAB — PULMONARY FUNCTION TEST
DL/VA % pred: 48 %
DL/VA: 2.04 ml/min/mmHg/L
DLCO cor % pred: 22 %
DLCO cor: 4.22 ml/min/mmHg
DLCO unc % pred: 22 %
DLCO unc: 4.22 ml/min/mmHg
FEF 25-75 Post: 1.19 L/sec
FEF 25-75 Pre: 0.94 L/sec
FEF2575-%Change-Post: 26 %
FEF2575-%Pred-Post: 64 %
FEF2575-%Pred-Pre: 51 %
FEV1-%Change-Post: 13 %
FEV1-%Pred-Post: 95 %
FEV1-%Pred-Pre: 83 %
FEV1-Post: 2.07 L
FEV1-Pre: 1.82 L
FEV1FVC-%Change-Post: 9 %
FEV1FVC-%Pred-Pre: 78 %
FEV6-%Change-Post: 6 %
FEV6-%Pred-Post: 112 %
FEV6-%Pred-Pre: 105 %
FEV6-Post: 3.09 L
FEV6-Pre: 2.91 L
FEV6FVC-%Change-Post: 1 %
FEV6FVC-%Pred-Post: 101 %
FEV6FVC-%Pred-Pre: 100 %
FVC-%Change-Post: 4 %
FVC-%Pred-Post: 110 %
FVC-%Pred-Pre: 105 %
FVC-Post: 3.18 L
FVC-Pre: 3.04 L
Post FEV1/FVC ratio: 65 %
Post FEV6/FVC ratio: 97 %
Pre FEV1/FVC ratio: 60 %
Pre FEV6/FVC Ratio: 96 %
RV % pred: 353 %
RV: 7.56 L
TLC % pred: 202 %
TLC: 9.91 L

## 2020-07-20 NOTE — Progress Notes (Signed)
Full PFT completed today ? ?

## 2020-07-26 DIAGNOSIS — M19071 Primary osteoarthritis, right ankle and foot: Secondary | ICD-10-CM | POA: Diagnosis not present

## 2020-07-26 DIAGNOSIS — Z79899 Other long term (current) drug therapy: Secondary | ICD-10-CM | POA: Diagnosis not present

## 2020-07-26 DIAGNOSIS — G8929 Other chronic pain: Secondary | ICD-10-CM | POA: Diagnosis not present

## 2020-07-26 DIAGNOSIS — M25571 Pain in right ankle and joints of right foot: Secondary | ICD-10-CM | POA: Diagnosis not present

## 2020-08-09 DIAGNOSIS — H02889 Meibomian gland dysfunction of unspecified eye, unspecified eyelid: Secondary | ICD-10-CM | POA: Diagnosis not present

## 2020-08-09 DIAGNOSIS — H04129 Dry eye syndrome of unspecified lacrimal gland: Secondary | ICD-10-CM | POA: Diagnosis not present

## 2020-08-10 ENCOUNTER — Other Ambulatory Visit (HOSPITAL_COMMUNITY): Payer: Medicare HMO

## 2020-08-13 ENCOUNTER — Other Ambulatory Visit: Payer: Self-pay

## 2020-08-13 ENCOUNTER — Ambulatory Visit (INDEPENDENT_AMBULATORY_CARE_PROVIDER_SITE_OTHER): Payer: Medicare HMO | Admitting: Family Medicine

## 2020-08-13 ENCOUNTER — Encounter: Payer: Self-pay | Admitting: Family Medicine

## 2020-08-13 VITALS — BP 146/71 | HR 60 | Resp 16 | Ht 63.0 in | Wt 127.0 lb

## 2020-08-13 DIAGNOSIS — Z23 Encounter for immunization: Secondary | ICD-10-CM

## 2020-08-13 DIAGNOSIS — H04123 Dry eye syndrome of bilateral lacrimal glands: Secondary | ICD-10-CM

## 2020-08-13 DIAGNOSIS — E785 Hyperlipidemia, unspecified: Secondary | ICD-10-CM

## 2020-08-13 DIAGNOSIS — R7301 Impaired fasting glucose: Secondary | ICD-10-CM

## 2020-08-13 DIAGNOSIS — R499 Unspecified voice and resonance disorder: Secondary | ICD-10-CM

## 2020-08-13 DIAGNOSIS — F5104 Psychophysiologic insomnia: Secondary | ICD-10-CM

## 2020-08-13 DIAGNOSIS — H6121 Impacted cerumen, right ear: Secondary | ICD-10-CM

## 2020-08-13 DIAGNOSIS — F17210 Nicotine dependence, cigarettes, uncomplicated: Secondary | ICD-10-CM

## 2020-08-13 DIAGNOSIS — I10 Essential (primary) hypertension: Secondary | ICD-10-CM

## 2020-08-13 DIAGNOSIS — Z0001 Encounter for general adult medical examination with abnormal findings: Secondary | ICD-10-CM | POA: Diagnosis not present

## 2020-08-13 DIAGNOSIS — Z780 Asymptomatic menopausal state: Secondary | ICD-10-CM

## 2020-08-13 DIAGNOSIS — E559 Vitamin D deficiency, unspecified: Secondary | ICD-10-CM

## 2020-08-13 DIAGNOSIS — Z0000 Encounter for general adult medical examination without abnormal findings: Secondary | ICD-10-CM

## 2020-08-13 MED ORDER — TRAZODONE HCL 50 MG PO TABS: ORAL_TABLET | ORAL | 1 refills | Status: DC

## 2020-08-13 NOTE — Assessment & Plan Note (Signed)
persistent right cerumen impaction, refer ENT

## 2020-08-13 NOTE — Assessment & Plan Note (Signed)
Treated at [facility], has procedures recommended and will have to pay out of pocket, trying to garner $$ for this

## 2020-08-13 NOTE — Assessment & Plan Note (Signed)
Elevated at visit. No med change, likely opioid withdrawal is contributing Has upcoming Cardiology appointment

## 2020-08-13 NOTE — Assessment & Plan Note (Signed)
uncontrolled  Sleep hygiene reviewed and written information offered also. Prescription sent for  medication needed. Dose increase in trazodone

## 2020-08-13 NOTE — Progress Notes (Signed)
    [name]     MRN: [id number]      DOB: 1951/03/17  HPI: Patient is in for annual physical exam. Painless hoarseness progressing and difficulty swallowing ,has had ENT remove precancerous cells twice in the past per pt, 2004, and 2009 ( [place]) Wearing hearing aids for past 1 year, notes impaction of wax on right ear  Plug , wants this checked Having opioid withdrawal symptoms just starting a taper , reports , experiencing poor sleep , poor appetite, anxiety , sweating , chills  Needs updated labs today Immunization is reviewed , and  updated    PE: BP (!) 146/71   Pulse 60   Resp 16   Ht 5\' 3"  (1.6 m)   Wt 127 lb 0.6 oz (57.6 kg)   SpO2 94%   BMI 22.50 kg/m  Pleasant  female, alert and oriented x 3, in no cardio-pulmonary distress. Afebrile. HEENT No facial trauma or asymetry. Sinuses non tender.  Extra occullar muscles intact.. External ears normal, .Left tM clear with good light reflex. Cerumen impaction right ear Neck: supple, no adenopathy,JVD or thyromegaly.No bruits.  Chest: Clear to ascultation bilaterally.No crackles or wheezes. Non tender to palpation  Breast: No physical exam at visit. Normal mammogram in 05/2020.  Cardiovascular system; Heart sounds normal,  S1 and  S2 ,no S3.  No murmur, or thrill. Apical beat not displaced Peripheral pulses normal.  Abdomen: Soft, non tender, No guarding, tenderness or rebound.      Musculoskeletal exam: Decreased though adequate  ROM of spine, hips , shoulders and knees. . No muscle wasting or atrophy.   Neurologic: Cranial nerves 2 to 12 intact. Power, tone ,sensation normal throughout. No disturbance in gait. No tremor.  Skin: Intact, no ulceration, erythema , scaling or rash noted. Pigmentation normal throughout  Psych; Normal mood and affect. Judgement and concentration normal   Assessment & Plan:  Annual physical exam Annual exam as documented. Counseling done  re healthy lifestyle  involving commitment to 150 minutes exercise per week, heart healthy diet, and attaining healthy weight.The importance of adequate sleep also discussed. Regular seat belt use and home safety, is also discussed. Changes in health habits are decided on by the patient with goals and time frames  set for achieving them. Immunization and cancer screening needs are specifically addressed at this visit.   Cerumen debris on tympanic membrane of right ear persistent right cerumen impaction, refer ENT  Essential hypertension Elevated at visit. No med change, likely opioid withdrawal is contributing Has upcoming Cardiology appointment   Cigarette smoker Asked:confirms currently smokes cigarettes Assess: Unwilling to set a quit date, but is cutting back Advise: needs to QUIT to reduce risk of cancer, cardio and cerebrovascular disease Assist: counseled for 5 minutes and literature provided Arrange: follow up in 2 to 4 months   Chronically dry eyes, bilateral Treated at [facility], has procedures recommended and will have to pay out of pocket, trying to garner $$ for this  Insomnia uncontrolled  Sleep hygiene reviewed and written information offered also. Prescription sent for  medication needed. Dose increase in trazodone

## 2020-08-13 NOTE — Assessment & Plan Note (Signed)
Asked:confirms currently smokes cigarettes °Assess: Unwilling to set a quit date, but is cutting back °Advise: needs to QUIT to reduce risk of cancer, cardio and cerebrovascular disease °Assist: counseled for 5 minutes and literature provided °Arrange: follow up in 2 to 4 months ° °

## 2020-08-13 NOTE — Assessment & Plan Note (Signed)

## 2020-08-13 NOTE — Patient Instructions (Addendum)
F/U in office with MD in 4 months, call if you need me before  Flu vaccine today Labs today ( [facility]) lipid, TSH, vit D and HBA1C   You are referred to ENT [facility]) re painless hoarseness and right cerumen impaction  Blood pressure slightly high at visit, Cardiology to follow up in the near futre, opioid withdrawal may be contributing , but you DO need to follow through with this  You are referred for bone density test, ( near to your physical location and mobile if possible to be arranged)  New higher dose of trazodone 50 mg  for sleep  is one and a half tablets at bedtime  Please work on cutting back cigarettes, with a goal to quitting  Thanks for choosing [facility], we consider it a privelige to serve you.

## 2020-08-14 ENCOUNTER — Encounter (HOSPITAL_COMMUNITY): Payer: Medicare HMO

## 2020-08-15 ENCOUNTER — Ambulatory Visit (INDEPENDENT_AMBULATORY_CARE_PROVIDER_SITE_OTHER): Payer: Medicare HMO | Admitting: Internal Medicine

## 2020-08-15 ENCOUNTER — Encounter: Payer: Self-pay | Admitting: Internal Medicine

## 2020-08-15 ENCOUNTER — Other Ambulatory Visit: Payer: Self-pay

## 2020-08-15 DIAGNOSIS — F17210 Nicotine dependence, cigarettes, uncomplicated: Secondary | ICD-10-CM

## 2020-08-15 DIAGNOSIS — J449 Chronic obstructive pulmonary disease, unspecified: Secondary | ICD-10-CM

## 2020-08-15 DIAGNOSIS — R0902 Hypoxemia: Secondary | ICD-10-CM | POA: Diagnosis not present

## 2020-08-15 DIAGNOSIS — Z9981 Dependence on supplemental oxygen: Secondary | ICD-10-CM | POA: Diagnosis not present

## 2020-08-15 LAB — LIPID PANEL
Cholesterol: 181 mg/dL (ref <200)
HDL: 66 mg/dL (ref >=50)
LDL Cholesterol (Calc): 86 mg/dL (calc)
Non-HDL Cholesterol (Calc): 115 mg/dL (calc) (ref <130)
Total CHOL/HDL Ratio: 2.7 (calc) (ref <5.0)
Triglycerides: 202 mg/dL — ABNORMAL HIGH (ref <150)

## 2020-08-15 LAB — HEMOGLOBIN A1C
Hgb A1c MFr Bld: 5.8 % of total Hgb — ABNORMAL HIGH (ref <5.7)
Mean Plasma Glucose: 120 (calc)
eAG (mmol/L): 6.6 (calc)

## 2020-08-15 LAB — TSH: TSH: 1.54 mIU/L (ref 0.40–4.50)

## 2020-08-15 LAB — VITAMIN D 25 HYDROXY (VIT D DEFICIENCY, FRACTURES): Vit D, 25-Hydroxy: 29 ng/mL — ABNORMAL LOW (ref 30–100)

## 2020-08-15 MED ORDER — PANTOPRAZOLE SODIUM 40 MG PO TBEC: DELAYED_RELEASE_TABLET | ORAL | 2 refills | Status: DC

## 2020-08-15 MED ORDER — PANTOPRAZOLE SODIUM 40 MG PO TBEC: DELAYED_RELEASE_TABLET | ORAL | 0 refills | Status: DC

## 2020-08-15 NOTE — Assessment & Plan Note (Signed)
ONO RA done 05/02/20  desat < 89% x 287 min > rx 2lpm per dr Moshe Cipro - did not desaturate 06/19/2020 on 600 ft rapid walk  - no desats x 600 ft walking 08/15/2020   rec continue 2lpm hs, no need for portable 02

## 2020-08-15 NOTE — Assessment & Plan Note (Signed)
Active smoker - s/p RLLobectomy 2016 - Spirometry 09/08/17   FEV1 1.98 (98%)  Ratio 0.69  With minimal curvature  -  06/19/2020   Walked RA  approx   600 ft  @ fast pace  stopped due to  End of study, slt dizzy at end s sob and sats 96%    - PFT's  07/20/20  FEV1 2.07 (95 % ) ratio 0.65  p 0 % improvement from saba p 0 prior to study with DLCO  4.22 (22%) corrects to 2.07 (45%)  for alv volume and FV curve mild concavity  Air trapping present   -  08/15/2020   Walked RA  approx   600  ft  @ fast pace  stopped due to  End of study, no sob, sats 98%     She has relatively well preserved lung fx considering she is s/p RLLobectomy, no need for rx other than breathe clean air   Her problem with the safety lock appears to be upper airway in origin with plans to f/u with [facility] and in meantime rec max rx for GERD as this is more likely than recurrent ca

## 2020-08-15 NOTE — Assessment & Plan Note (Signed)
Counseled re importance of smoking cessation but did not meet time criteria for separate billing           Each maintenance medication was reviewed in detail including emphasizing most importantly the difference between maintenance and prns and under what circumstances the prns are to be triggered using an action plan format where appropriate.  Total time for H and P, chart review, counseling,  directly observing portions of ambulatory 02 saturation study/ and generating customized AVS unique to this office visit / charting > 30 min

## 2020-08-15 NOTE — Progress Notes (Addendum)
[name], female    DOB: 1951/01/14,    MRN: [id number]   Brief patient profile:  [id number] active smoker healthy until  2016 incidental  RLL nodule   DIAGNOSIS:  Stage IA (T1a, N0, M0) non-small cell lung cancer, moderately differentiated squamous cell carcinoma diagnosed in July 2016  PRIOR THERAPY: Right VATS with right lower lobectomy with lymph node dissection under the care of Dr. Servando Snare on 07/25/2015    History of Present Illness  06/19/2020  Pulmonary/ 1st office eval/ [name] / [name]  Chief Complaint  Patient presents with  . Consult    Shortness of breath with exertion  Dyspnea:  50 -100 ft (not verified on 600 ft walk on day of ov)  Cough: immediately lying down and in am clear x rattling  Sleep: on side one pillow  SABA use: none / "spiriva hurt my lungs" 02  2lpm hs only  rec The key is to stop smoking completely before smoking completely stops you - good luck! I will document your problems with the safety lock to support your claim you have trouble using it. No need for medications for now   08/15/2020  f/u ov/[name] re: COPD GOLD 1 still smoking / no maint rx  Chief Complaint  Patient presents with  . Follow-up    shortness of breath with exertion  Dyspnea:  Worse since last ov / plans to see Benjamine Mola soon as also more hoarse Cough:  qhs severe and dry nose and throat  X months Sleeping: on R side bed is flat   SABA use: none  02: 2lpm hs     No obvious day to day or daytime variability or assoc excess/ purulent sputum or mucus plugs or hemoptysis or cp or chest tightness, subjective wheeze or overt sinus or hb symptoms.   Sleeping  without nocturnal  or early am exacerbation  of respiratory  c/o's or need for noct saba. Also denies any obvious fluctuation of symptoms with weather or environmental changes or other aggravating or alleviating factors except as outlined above   No unusual exposure hx or h/o childhood pna/ asthma or knowledge of  premature birth.  Current Allergies, Complete Past Medical History, Past Surgical History, Family History, and Social History were reviewed in [employer] record.  ROS  The following are not active complaints unless bolded Hoarseness, sore throat, dysphagia, dental problems, itching, sneezing,  nasal congestion or discharge of excess mucus or purulent secretions, ear ache,   fever, chills, sweats, unintended wt loss or wt gain, classically pleuritic or exertional cp,  orthopnea pnd or arm/hand swelling  or leg swelling, presyncope, palpitations, abdominal pain, anorexia, nausea, vomiting, diarrhea  or change in bowel habits or change in bladder habits, change in stools or change in urine, dysuria, hematuria,  rash, arthralgias, visual complaints, headache, numbness, weakness or ataxia or problems with walking or coordination,  change in mood or  memory.        Current Meds  Medication Sig  . carisoprodol (SOMA) 350 MG tablet Take 350 mg by mouth at bedtime.   . cetirizine (ZYRTEC) 10 MG tablet Take 1 tablet (10 mg total) daily by mouth. (Patient taking differently: Take 10 mg by mouth daily as needed for allergies. )  . clopidogrel (PLAVIX) 75 MG tablet Take 37.5 mg by mouth daily.  . cyclobenzaprine (FLEXERIL) 10 MG tablet Take 10 mg by mouth at bedtime.  Marland Kitchen EPINEPHrine 0.3 mg/0.3 mL IJ SOAJ injection Inject 0.3  mg into the muscle as needed.   Marland Kitchen erythromycin ophthalmic ointment Place 1 application into both eyes at bedtime.   . flecainide (TAMBOCOR) 150 MG tablet Take 75 mg by mouth 2 (two) times daily.   Marland Kitchen HYDROcodone-acetaminophen (NORCO) 7.5-325 MG tablet Take 1 tablet by mouth in the morning, at noon, in the evening, and at bedtime.  . metoprolol succinate (TOPROL-XL) 25 MG 24 hr tablet Take 12.5 mg by mouth every evening.   . pantoprazole (PROTONIX) 40 MG tablet Take 40 mg by mouth daily.  . rosuvastatin (CRESTOR) 20 MG tablet Take 20 mg by mouth daily.  . traZODone  (DESYREL) 50 MG tablet Take one and a half tablets at bedtime for sleeep                      Past Medical History:  Diagnosis Date  . Acute GI bleeding 01/28/2012  . Anemia due to blood loss, acute 01/28/2012  . 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 Arthritis    "qwhere; hands, feet, overall stiffness" (10/20/2013)  . Cancer ([place])    lung cancer  . Chronic bronchitis ([place])   . Chronic lower back pain   . Complication of anesthesia    "lungs quit working during [place] in [place]" (10/20/2013); pt. states that she can't breathe after surgery when laying on back  . COPD (chronic obstructive pulmonary disease) ([place])   . Coronary artery disease   . Daily headache    Patient stated they are felt in back of the head, not throbing. But always in same spot. MRI's done, no reason why they occur. (10/20/2013)  . Depression   . Diastolic dysfunction 5/36/6440   Grade 1  . Diverticulitis    pt reports 8 times. Dr. Geroge Baseman colectomy in 2009  . Diverticulosis 2008   diagnosed; pt. states now cured 07/19/15  . Dysrhythmia    pt. states not since ablation..history of Supraventricular tachycardia  . Fibromyalgia   . Fracture 2006   left foot & ankle , immobilized for healing   . Gout    Recently diagnosed.  Marland Kitchen HCV antibody positive   . HEARING LOSS    since age 46  . Hepatitis C 1993    Needs Hepatic panel every 6   months, treated for 1 year   . Hiatal hernia    "repaired"   . History of blood transfusion    "probably when I was young, when I was 15" (10/20/2013)  . History of pneumonia   . Hyperlipidemia 2001  . Hypertension 2001  . Menopause    per medical history form  . Night sweats    Per medical history form dated 05/02/11.  . On home oxygen therapy    "2L only at night" (10/20/2013); pt. currently not wearing O2 at night (07/19/15)  . Panic disorder    was followed by mental health  . Sleep apnea 2001   non compliant wit the use of the machine  .  Sleep apnea    wear oxygen at bedtime.   . SOB (shortness of breath)    "after lying in bed, go to the bathroom; heart races & I'm SOB" (10/20/2013)  . SVT (supraventricular tachycardia) ([place])    s/p ablation 10-20-2013 by Dr Lovena Le  . Tinnitus 2006   disabling  . Wears dentures    Per medical history form dated 05/02/11.  . Wears glasses  Objective:      hoarse amb wf nad   Wt Readings from Last 3 Encounters:  08/15/20 125 lb 6.4 oz (56.9 kg)  08/13/20 127 lb 0.6 oz (57.6 kg)  06/19/20 123 lb 6.4 oz (56 kg)     Vital signs reviewed - Note on arrival 08/15/2020  02 sats  96% on RA     HEENT : pt wearing mask not removed for exam due to covid - 19 concerns.   NECK :  without JVD/Nodes/TM/ nl carotid upstrokes bilaterally   LUNGS: no acc muscle use,  Min barrel  contour chest wall with bilateral  slightly decreased bs esp on R s audible wheeze and  without cough on insp or exp maneuvers and min  Hyperresonant  to  percussion bilaterally     CV:  RRR  no s3 or murmur or increase in P2, and no edema   ABD:  soft and nontender with pos end  insp Hoover's  in the supine position. No bruits or organomegaly appreciated, bowel sounds nl  MS:   Nl gait/  ext warm without deformities, calf tenderness, cyanosis or clubbing No obvious joint restrictions   SKIN: warm and dry without lesions    NEURO:  alert, approp, nl sensorium with  no motor or cerebellar deficits apparent.        I personally reviewed images and agree with radiology impression as follows:   Chest CT 06/12/20 Status post right lower lobectomy. No evidence of recurrent or metastatic disease.     Assessment

## 2020-08-15 NOTE — Patient Instructions (Addendum)
No pulmonary medications are needed   Counseled re importance of smoking cessation but did not meet time criteria for separate billing    Protonix 40 mg Take 30- 60 min before your first and last meals of the day   GERD (REFLUX)  is an extremely common cause of respiratory symptoms just like yours , many times with no obvious heartburn at all.    It can be treated with medication, but also with lifestyle changes including elevation of the head of your bed (ideally with 6 -8inch blocks under the headboard of your bed),  Smoking cessation, avoidance of late meals, excessive alcohol, and avoid fatty foods, chocolate, peppermint, colas, red wine, and acidic juices such as orange juice.  NO MINT OR MENTHOL PRODUCTS SO NO COUGH DROPS  USE SUGARLESS CANDY INSTEAD (Jolley ranchers or Stover's or Life Savers) or even ice chips will also do - the key is to swallow to prevent all throat clearing. NO OIL BASED VITAMINS - use powdered substitutes.  Avoid fish oil when coughing.    If you are satisfied with your treatment plan,  let your doctor know and he/she can either refill your medications or you can return here when your prescription runs out.     If in any way you are not 100% satisfied,  please tell us.  If 100% better, tell your friends!  Pulmonary follow up is as needed      Add:  Because  Of your throat problem it may be difficult to use the safety lock - the plan is to let Dr Benjamine Mola of ENT so look at your airway to determine whether you can use the device or need surgery to correct the problem

## 2020-09-03 ENCOUNTER — Encounter: Payer: Medicare HMO | Admitting: Family Medicine

## 2020-09-16 DIAGNOSIS — J432 Centrilobular emphysema: Secondary | ICD-10-CM | POA: Diagnosis not present

## 2020-09-16 DIAGNOSIS — J449 Chronic obstructive pulmonary disease, unspecified: Secondary | ICD-10-CM | POA: Diagnosis not present

## 2020-09-17 DIAGNOSIS — R490 Dysphonia: Secondary | ICD-10-CM | POA: Diagnosis not present

## 2020-09-17 DIAGNOSIS — H6121 Impacted cerumen, right ear: Secondary | ICD-10-CM | POA: Diagnosis not present

## 2020-09-17 DIAGNOSIS — D380 Neoplasm of uncertain behavior of larynx: Secondary | ICD-10-CM | POA: Diagnosis not present

## 2020-09-19 ENCOUNTER — Other Ambulatory Visit: Payer: Self-pay | Admitting: Otolaryngology

## 2020-09-19 DIAGNOSIS — M19071 Primary osteoarthritis, right ankle and foot: Secondary | ICD-10-CM | POA: Diagnosis not present

## 2020-09-19 DIAGNOSIS — G629 Polyneuropathy, unspecified: Secondary | ICD-10-CM | POA: Diagnosis not present

## 2020-09-19 DIAGNOSIS — M25571 Pain in right ankle and joints of right foot: Secondary | ICD-10-CM | POA: Diagnosis not present

## 2020-09-19 DIAGNOSIS — G8929 Other chronic pain: Secondary | ICD-10-CM | POA: Diagnosis not present

## 2020-09-19 DIAGNOSIS — Z79899 Other long term (current) drug therapy: Secondary | ICD-10-CM | POA: Diagnosis not present

## 2020-09-20 DIAGNOSIS — H02883 Meibomian gland dysfunction of right eye, unspecified eyelid: Secondary | ICD-10-CM | POA: Diagnosis not present

## 2020-09-20 DIAGNOSIS — H02886 Meibomian gland dysfunction of left eye, unspecified eyelid: Secondary | ICD-10-CM | POA: Diagnosis not present

## 2020-09-20 DIAGNOSIS — H04123 Dry eye syndrome of bilateral lacrimal glands: Secondary | ICD-10-CM | POA: Diagnosis not present

## 2020-09-25 ENCOUNTER — Other Ambulatory Visit: Payer: Self-pay | Admitting: Otolaryngology

## 2020-10-02 ENCOUNTER — Telehealth: Payer: Self-pay | Admitting: Internal Medicine

## 2020-10-02 ENCOUNTER — Other Ambulatory Visit: Payer: Self-pay | Admitting: Internal Medicine

## 2020-10-02 ENCOUNTER — Encounter: Payer: Medicare HMO | Admitting: Family Medicine

## 2020-10-02 NOTE — Telephone Encounter (Signed)
Spoke with pt. Advised her that we do not have any available appointments for PFTs before 10/15/20. Pt states "I just won't worry about it." Nothing further was needed.

## 2020-10-03 ENCOUNTER — Encounter (HOSPITAL_BASED_OUTPATIENT_CLINIC_OR_DEPARTMENT_OTHER): Payer: Self-pay | Admitting: Otolaryngology

## 2020-10-03 ENCOUNTER — Other Ambulatory Visit: Payer: Self-pay

## 2020-10-03 NOTE — Progress Notes (Signed)
Chart reviewed by Dr. Gifford Shave and will proceed with surgery as scheduled with no additional cardiac work up.

## 2020-10-11 ENCOUNTER — Other Ambulatory Visit (HOSPITAL_COMMUNITY)
Admission: RE | Admit: 2020-10-11 | Discharge: 2020-10-11 | Disposition: A | Discharge status: Home / Self Care | Payer: Medicare HMO | Source: Ambulatory Visit | Attending: Otolaryngology | Admitting: Otolaryngology

## 2020-10-11 DIAGNOSIS — Z01812 Encounter for preprocedural laboratory examination: Secondary | ICD-10-CM | POA: Insufficient documentation

## 2020-10-11 DIAGNOSIS — Z20822 Contact with and (suspected) exposure to covid-19: Secondary | ICD-10-CM | POA: Diagnosis not present

## 2020-10-11 LAB — SARS CORONAVIRUS 2 (TAT 6-24 HRS): SARS Coronavirus 2: NEGATIVE

## 2020-10-15 ENCOUNTER — Ambulatory Visit (HOSPITAL_BASED_OUTPATIENT_CLINIC_OR_DEPARTMENT_OTHER): Payer: Medicare HMO | Admitting: Anesthesiology

## 2020-10-15 ENCOUNTER — Encounter (HOSPITAL_BASED_OUTPATIENT_CLINIC_OR_DEPARTMENT_OTHER): Payer: Self-pay | Admitting: Otolaryngology

## 2020-10-15 ENCOUNTER — Encounter (HOSPITAL_BASED_OUTPATIENT_CLINIC_OR_DEPARTMENT_OTHER)
Admission: RE | Disposition: A | Discharge status: Home / Self Care | Payer: Self-pay | Source: Home / Self Care | Attending: Otolaryngology

## 2020-10-15 ENCOUNTER — Ambulatory Visit (HOSPITAL_BASED_OUTPATIENT_CLINIC_OR_DEPARTMENT_OTHER)
Admission: RE | Admit: 2020-10-15 | Discharge: 2020-10-15 | Disposition: A | Discharge status: Home / Self Care | Payer: Medicare HMO | Attending: Otolaryngology | Admitting: Otolaryngology

## 2020-10-15 ENCOUNTER — Other Ambulatory Visit: Payer: Self-pay

## 2020-10-15 DIAGNOSIS — Z8379 Family history of other diseases of the digestive system: Secondary | ICD-10-CM | POA: Diagnosis not present

## 2020-10-15 DIAGNOSIS — Z8261 Family history of arthritis: Secondary | ICD-10-CM | POA: Insufficient documentation

## 2020-10-15 DIAGNOSIS — F458 Other somatoform disorders: Secondary | ICD-10-CM | POA: Insufficient documentation

## 2020-10-15 DIAGNOSIS — G4700 Insomnia, unspecified: Secondary | ICD-10-CM | POA: Diagnosis not present

## 2020-10-15 DIAGNOSIS — Z800 Family history of malignant neoplasm of digestive organs: Secondary | ICD-10-CM | POA: Insufficient documentation

## 2020-10-15 DIAGNOSIS — J383 Other diseases of vocal cords: Secondary | ICD-10-CM | POA: Diagnosis not present

## 2020-10-15 DIAGNOSIS — Z836 Family history of other diseases of the respiratory system: Secondary | ICD-10-CM | POA: Diagnosis not present

## 2020-10-15 DIAGNOSIS — F17210 Nicotine dependence, cigarettes, uncomplicated: Secondary | ICD-10-CM | POA: Diagnosis not present

## 2020-10-15 DIAGNOSIS — H6121 Impacted cerumen, right ear: Secondary | ICD-10-CM | POA: Diagnosis not present

## 2020-10-15 DIAGNOSIS — Z902 Acquired absence of lung [part of]: Secondary | ICD-10-CM | POA: Insufficient documentation

## 2020-10-15 DIAGNOSIS — J449 Chronic obstructive pulmonary disease, unspecified: Secondary | ICD-10-CM | POA: Diagnosis not present

## 2020-10-15 DIAGNOSIS — K219 Gastro-esophageal reflux disease without esophagitis: Secondary | ICD-10-CM | POA: Diagnosis not present

## 2020-10-15 DIAGNOSIS — Z8249 Family history of ischemic heart disease and other diseases of the circulatory system: Secondary | ICD-10-CM | POA: Diagnosis not present

## 2020-10-15 DIAGNOSIS — I2510 Atherosclerotic heart disease of native coronary artery without angina pectoris: Secondary | ICD-10-CM | POA: Diagnosis not present

## 2020-10-15 DIAGNOSIS — R490 Dysphonia: Secondary | ICD-10-CM | POA: Diagnosis not present

## 2020-10-15 DIAGNOSIS — Z85118 Personal history of other malignant neoplasm of bronchus and lung: Secondary | ICD-10-CM | POA: Diagnosis not present

## 2020-10-15 DIAGNOSIS — J432 Centrilobular emphysema: Secondary | ICD-10-CM | POA: Diagnosis not present

## 2020-10-15 DIAGNOSIS — D380 Neoplasm of uncertain behavior of larynx: Secondary | ICD-10-CM | POA: Diagnosis not present

## 2020-10-15 HISTORY — PX: MICROLARYNGOSCOPY: SHX5208

## 2020-10-15 SURGERY — MICROLARYNGOSCOPY
Anesthesia: General | Site: Throat

## 2020-10-15 MED ORDER — ONDANSETRON HCL 4 MG/2ML IJ SOLN
INTRAMUSCULAR | Status: DC | PRN
  Administered 2020-10-15: 4 mg via INTRAVENOUS

## 2020-10-15 MED ORDER — LIDOCAINE HCL (CARDIAC) PF 100 MG/5ML IV SOSY
PREFILLED_SYRINGE | INTRAVENOUS | Status: DC | PRN
  Administered 2020-10-15: 50 mg via INTRAVENOUS

## 2020-10-15 MED ORDER — EPINEPHRINE PF 1 MG/ML IJ SOLN
INTRAMUSCULAR | Status: DC | PRN
  Administered 2020-10-15: .5 mg

## 2020-10-15 MED ORDER — ONDANSETRON HCL 4 MG/2ML IJ SOLN
INTRAMUSCULAR | Status: AC
  Filled 2020-10-15: qty 2

## 2020-10-15 MED ORDER — ONDANSETRON HCL 4 MG/2ML IJ SOLN: 4.0000 mg | Freq: Once | INTRAMUSCULAR | Status: DC | PRN

## 2020-10-15 MED ORDER — PROPOFOL 10 MG/ML IV BOLUS
INTRAVENOUS | Status: AC
  Filled 2020-10-15: qty 20

## 2020-10-15 MED ORDER — CEFAZOLIN SODIUM-DEXTROSE 2-3 GM-%(50ML) IV SOLR
INTRAVENOUS | Status: DC | PRN
  Administered 2020-10-15: 2 g via INTRAVENOUS

## 2020-10-15 MED ORDER — LIDOCAINE 2% (20 MG/ML) 5 ML SYRINGE
INTRAMUSCULAR | Status: AC
  Filled 2020-10-15: qty 5

## 2020-10-15 MED ORDER — ROCURONIUM BROMIDE 100 MG/10ML IV SOLN
INTRAVENOUS | Status: DC | PRN
  Administered 2020-10-15: 60 mg via INTRAVENOUS

## 2020-10-15 MED ORDER — MIDAZOLAM HCL 2 MG/2ML IJ SOLN
INTRAMUSCULAR | Status: AC
  Filled 2020-10-15: qty 2

## 2020-10-15 MED ORDER — ROCURONIUM BROMIDE 10 MG/ML (PF) SYRINGE
PREFILLED_SYRINGE | INTRAVENOUS | Status: AC
  Filled 2020-10-15: qty 10

## 2020-10-15 MED ORDER — DEXAMETHASONE SODIUM PHOSPHATE 4 MG/ML IJ SOLN
INTRAMUSCULAR | Status: DC | PRN
  Administered 2020-10-15: 8 mg via INTRAVENOUS

## 2020-10-15 MED ORDER — AMISULPRIDE (ANTIEMETIC) 5 MG/2ML IV SOLN: 10.0000 mg | Freq: Once | INTRAVENOUS | Status: DC | PRN

## 2020-10-15 MED ORDER — SUGAMMADEX SODIUM 200 MG/2ML IV SOLN
INTRAVENOUS | Status: DC | PRN
  Administered 2020-10-15: 200 mg via INTRAVENOUS

## 2020-10-15 MED ORDER — FENTANYL CITRATE (PF) 100 MCG/2ML IJ SOLN
INTRAMUSCULAR | Status: DC | PRN
  Administered 2020-10-15 (×2): 50 ug via INTRAVENOUS

## 2020-10-15 MED ORDER — PROPOFOL 10 MG/ML IV BOLUS
INTRAVENOUS | Status: DC | PRN
  Administered 2020-10-15: 200 mg via INTRAVENOUS

## 2020-10-15 MED ORDER — FENTANYL CITRATE (PF) 100 MCG/2ML IJ SOLN
INTRAMUSCULAR | Status: AC
  Filled 2020-10-15: qty 2

## 2020-10-15 MED ORDER — FENTANYL CITRATE (PF) 100 MCG/2ML IJ SOLN: 25.0000 ug | INTRAMUSCULAR | Status: DC | PRN

## 2020-10-15 MED ORDER — LACTATED RINGERS IV SOLN: INTRAVENOUS | Status: DC

## 2020-10-15 MED ORDER — DEXAMETHASONE SODIUM PHOSPHATE 10 MG/ML IJ SOLN
INTRAMUSCULAR | Status: AC
  Filled 2020-10-15: qty 1

## 2020-10-15 SURGICAL SUPPLY — 24 items
CANISTER SUCT 1200ML W/VALVE (MISCELLANEOUS) ×3 IMPLANT
COVER WAND RF STERILE (DRAPES) IMPLANT
GAUZE SPONGE 4X4 12PLY STRL LF (GAUZE/BANDAGES/DRESSINGS) ×3 IMPLANT
GLOVE BIO SURGEON STRL SZ7.5 (GLOVE) ×3 IMPLANT
GLOVE SURG UNDER POLY LF SZ7 (GLOVE) ×2 IMPLANT
GOWN STRL REUS W/ TWL LRG LVL3 (GOWN DISPOSABLE) ×1 IMPLANT
GOWN STRL REUS W/TWL LRG LVL3 (GOWN DISPOSABLE)
GUARD TEETH (MISCELLANEOUS) ×3 IMPLANT
MARKER SKIN DUAL TIP RULER LAB (MISCELLANEOUS) ×3 IMPLANT
NDL SAFETY ECLIPSE 18X1.5 (NEEDLE) ×1 IMPLANT
NDL SPNL 22GX7 QUINCKE BK (NEEDLE) IMPLANT
NEEDLE HYPO 18GX1.5 SHARP (NEEDLE)
NEEDLE SPNL 22GX7 QUINCKE BK (NEEDLE) IMPLANT
NS IRRIG 1000ML POUR BTL (IV SOLUTION) ×3 IMPLANT
PACK BASIN DAY SURGERY FS (CUSTOM PROCEDURE TRAY) ×1 IMPLANT
PATTIES SURGICAL .5 X3 (DISPOSABLE) ×3 IMPLANT
SHEET MEDIUM DRAPE 40X70 STRL (DRAPES) ×3 IMPLANT
SLEEVE SCD COMPRESS KNEE MED (MISCELLANEOUS) ×3 IMPLANT
SOLUTION BUTLER CLEAR DIP (MISCELLANEOUS) ×3 IMPLANT
SYR CONTROL 10ML LL (SYRINGE) ×1 IMPLANT
SYR TB 1ML LL NO SAFETY (SYRINGE) ×3 IMPLANT
TOWEL GREEN STERILE FF (TOWEL DISPOSABLE) ×3 IMPLANT
TUBE CONNECTING 20'X1/4 (TUBING) ×1
TUBE CONNECTING 20X1/4 (TUBING) ×2 IMPLANT

## 2020-10-15 NOTE — Transfer of Care (Signed)
Immediate Anesthesia Transfer of Care Note  Patient: [name]  Procedure(s) Performed: [place] LARYNGOSCOPY WITH BIOPSY (N/A Throat)  Patient Location: PACU  Anesthesia Type:General  Level of Consciousness: awake  Airway & Oxygen Therapy: Patient connected to nasal cannula oxygen  Post-op Assessment: Report given to RN and Post -op Vital signs reviewed and stable  Post vital signs: Reviewed and stable  Last Vitals:  Vitals Value Taken Time  BP 139/92 10/15/20 0900  Temp 36.5 C 10/15/20 0845  Pulse 69 10/15/20 0903  Resp 12 10/15/20 0903  SpO2 97 % 10/15/20 0903  Vitals shown include unvalidated device data.  Last Pain:  Vitals:   10/15/20 0900  TempSrc:   PainSc: 0-No pain         Complications: No complications documented.

## 2020-10-15 NOTE — Anesthesia Postprocedure Evaluation (Signed)
Anesthesia Post Note  Patient: [name]  Procedure(s) Performed: [place] LARYNGOSCOPY WITH BIOPSY (N/A Throat)     Patient location during evaluation: PACU Anesthesia Type: General Level of consciousness: sedated Pain management: pain level controlled Vital Signs Assessment: post-procedure vital signs reviewed and stable Respiratory status: spontaneous breathing and respiratory function stable Cardiovascular status: stable Postop Assessment: no apparent nausea or vomiting Anesthetic complications: no   No complications documented.  Last Vitals:  Vitals:   10/15/20 0900 10/15/20 0935  BP: (!) 139/92 (!) 144/102  Pulse: 68 66  Resp: 12 14  Temp:  36.6 C  SpO2: 97% 100%    Last Pain:  Vitals:   10/15/20 0915  TempSrc:   PainSc: 0-No pain                 Merlinda Frederick

## 2020-10-15 NOTE — Op Note (Signed)
DATE OF PROCEDURE:  10/15/2020                              OPERATIVE REPORT  SURGEON:  Leta Baptist, MD  PREOPERATIVE DIAGNOSES: 1.  Bilateral vocal cord leukoplakia.  POSTOPERATIVE DIAGNOSES: 1.  Bilateral vocal cord leukoplakia.  PROCEDURE PERFORMED: MicroDirect laryngoscopy and biopsy of vocal cord leukoplakia.  ANESTHESIA:  General endotracheal tube anesthesia.  COMPLICATIONS:  None.  ESTIMATED BLOOD LOSS:  Minimal.  INDICATION FOR PROCEDURE:  [name] is a 69 y.o. female with a history of chronic hoarseness. The patient has noted intermittent hoarseness for the past year. She had vocal cord lesions removed in the past with mild dysplasia noted. Her last procedure was in 2009 in [place]. The patient has noted globus sensation at times and difficulty breathing with certain neck positions. She has some throat pain at night. The patient has a history of lung cancer with partial lobectomy. The patient is a 50+ pack year smoker.  On examination, the patient was noted to have bilateral vocal cord leukoplakia.  The appearance was worrisome for malignancy.  Based on the above findings, the decision was made for the patient to undergo the above-stated procedure.  The risks, benefits, alternatives, and details of the procedure were discussed with the patient.  Questions were invited and answered.  Informed consent was obtained.  DESCRIPTION:  The patient was taken to the operating room and placed supine on the operating table.  General endotracheal tube anesthesia was administered by the anesthesiologist.  The patient was positioned and prepped and draped in a standard fashion for direct laryngoscopy.   A Dedo laryngoscope was used for examination.  The laryngoscope was inserted via the oral cavity into the pharynx.  Examination of the vallecula, epiglottis, aryepiglottic folds, piriform sinuses were all normal.  Examination of the vocal cords revealed leukoplakia on the vocal cord bilaterally.   The Dedo laryngoscope was suspended with a Lewy suspender.  Using a 0 degree endoscope, photodocumentation of the findings were obtained.  An operating microscope was brought into the field.  Multiple biopsy specimens were obtained from the vocal cords bilaterally.  The specimens were sent to the pathology department for permanent histologic identification.  The Dedo laryngoscope was withdrawn.   The care of the patient was turned over to the anesthesiologist.  The patient was awakened from anesthesia without difficulty.  The patient was extubated and transferred to the recovery room in good condition.  OPERATIVE FINDINGS: Bilateral vocal cord leukoplakia.  SPECIMEN: Bilateral vocal cord biopsy specimens.  FOLLOWUP CARE:  The patient will be discharged home once awake and alert.   The patient will follow up in my office in 1 week.  [name] 10/15/2020 8:56 AM

## 2020-10-15 NOTE — H&P (Signed)
Cc: Chronic hoarseness  HPI: The patient is a 69 y/o female who presents today for evaluation of hoarseness. The patient has noted intermittent hoarseness for the past year. She had vocal cord lesions removed in the past with mild dysplasia noted. Her last procedure was in 2009 in [place]. The patient has noted globus sensation at times and difficulty breathing with certain neck positions. She has some throat pain at night. The patient has a history of lung cancer with partial lobectomy. She also has a history of reflux and takes pantoprazole bid. The patient is a 50+ pack year smoker.   The patient's review of systems (constitutional, eyes, ENT, cardiovascular, respiratory, GI, musculoskeletal, skin, neurologic, psychiatric, endocrine, hematologic, allergic) is noted in the ROS questionnaire.  It is reviewed with the patient.   Family health history: Hearing loss.  Major events: Cataracts extraction, cardiac ablation , hepatitis C, GI bleed, liver cancer/ surgery.  Ongoing medical problems: Hepatitis C, TIA's, GERD, arthritis, hiatal hernia, insomnia, neck pain, OSA, fatigue, dermatitis, anxiety disorder, fibromyalgia, cataracts.  Social history: The patient is single. She smokes  one pack of cigarettes a day. She denies the use of alcohol or illegal drugs.   Exam: General: Communicates without difficulty, well nourished, no acute distress. Head: Normocephalic, no evidence injury, no tenderness, facial buttresses intact without stepoff. Eyes: PERRL, EOMI.  No scleral icterus, conjunctivae clear. Ears: Right cerumen impaction. Nose: Normal skin and external support.  Anterior rhinoscopy reveals healthy pink mucosa over the septum and turbinates.  No lesions or polyps were seen. Oral cavity: Lips without lesions, oral mucosa moist, no masses or lesions seen. Indirect  mirror laryngoscopy could not be tolerated. Pharynx: Clear, no erythema. Neck: Supple, full range of motion, no lymphadenopathy, no  masses palpable. Salivary: Parotid and submandibular glands without mass. Neuro:  CN 2-12 grossly intact. Gait normal.   Procedure:  Flexible Fiberoptic Laryngoscopy -- Risks, benefits, and alternatives of flexible endoscopy were explained to the patient.  Specific mention was made of the risk of throat numbness with difficulty swallowing, possible bleeding from the nose and mouth, and pain from the procedure.  The patient gave oral consent to proceed.  The nasal cavities were decongested and anesthetised with a combination of oxymetazoline and 4% lidocaine solution.  The flexible scope was inserted into the right nasal cavity and advanced towards the nasopharynx.  Visualized mucosa over the turbinates and septum were as described above.  The nasopharynx was clear.  Oropharyngeal walls were symmetric and mobile without lesion, mass, or edema.  Hypopharynx was also without  lesion or edema.  Larynx was mobile without lesions. Supraglottic structures were free of edema, mass, and asymmetry.  True vocal folds were white with bilateral leukoplakia.  Base of tongue was within normal limits.  The patient tolerated the procedure well.   Assessment 1. Bilateral vocal cord leukoplakia is noted. The patient has a history of vocal cord dysplasia. No other suspicious mass or lesion is noted on today's fiberoptic laryngoscopy exam. 2. Tobacco dependence.  3. Right cerumen accumulation.   Plan  1. The physical exam and laryngoscopy findings are reviewed with the patient.  2. Otomicroscopy with right cerumen removal. 3. Recommend micro direct laryngoscopy with biopsy. The risks, benefits, alternatives, and details of the procedure are reviewed with the patient. Questions are invited and answered. 4. The patient is interested in proceeding with the procedure.  We will schedule the procedure in accordance with the family schedule.  5. Tobacco cessation is encouraged.

## 2020-10-15 NOTE — Discharge Instructions (Signed)
  Post Anesthesia Home Care Instructions  Activity: Get plenty of rest for the remainder of the day. A responsible individual must stay with you for 24 hours following the procedure.  For the next 24 hours, DO NOT: -Drive a car -Paediatric nurse -Drink alcoholic beverages -Take any medication unless instructed by your physician -Make any legal decisions or sign important papers.  Meals: Start with liquid foods such as gelatin or soup. Progress to regular foods as tolerated. Avoid greasy, spicy, heavy foods. If nausea and/or vomiting occur, drink only clear liquids until the nausea and/or vomiting subsides. Call your physician if vomiting continues.  Special Instructions/Symptoms: Your throat may feel dry or sore from the anesthesia or the breathing tube placed in your throat during surgery. If this causes discomfort, gargle with warm salt water. The discomfort should disappear within 24 hours.  If you had a scopolamine patch placed behind your ear for the management of post- operative nausea and/or vomiting:  1. The medication in the patch is effective for 72 hours, after which it should be removed.  Wrap patch in a tissue and discard in the trash. Wash hands thoroughly with soap and water. 2. You may remove the patch earlier than 72 hours if you experience unpleasant side effects which may include dry mouth, dizziness or visual disturbances. 3. Avoid touching the patch. Wash your hands with soap and water after contact with the patch.    ----------------------  The patient may resume all her previous activities and diet.  She is encouraged to rest her voice for 1 week.  She has a follow-up appointment in 1 week.

## 2020-10-15 NOTE — Anesthesia Procedure Notes (Signed)
Procedure Name: Intubation Date/Time: 10/15/2020 8:15 AM Performed by: Bufford Spikes, CRNA Pre-anesthesia Checklist: Patient identified, Emergency Drugs available, Suction available and Patient being monitored Patient Re-evaluated:Patient Re-evaluated prior to induction Oxygen Delivery Method: Circle system utilized Preoxygenation: Pre-oxygenation with 100% oxygen Induction Type: IV induction Ventilation: Mask ventilation without difficulty Laryngoscope Size: Miller and 2 Grade View: Grade II Tube type: Oral Tube size: 6.0 mm Number of attempts: 1 Airway Equipment and Method: Stylet Placement Confirmation: ETT inserted through vocal cords under direct vision,  positive ETCO2 and breath sounds checked- equal and bilateral Secured at: 21 cm Tube secured with: Tape Dental Injury: Teeth and Oropharynx as per pre-operative assessment

## 2020-10-15 NOTE — Anesthesia Preprocedure Evaluation (Addendum)
Anesthesia Evaluation  Patient identified by MRN, date of birth, ID band Patient awake    Reviewed: Allergy & Precautions, H&P , NPO status , Patient's Chart, lab work & pertinent test results, reviewed documented beta blocker date and time   History of Anesthesia Complications (+) history of anesthetic complications  Airway Mallampati: II  TM Distance: >3 FB Neck ROM: full    Dental  (+) Poor Dentition, Teeth Intact   Pulmonary sleep apnea and Continuous Positive Airway Pressure Ventilation , COPD, Current Smoker,    Pulmonary exam normal        Cardiovascular Exercise Tolerance: Poor hypertension, On Medications + CAD and + Peripheral Vascular Disease  Normal cardiovascular exam+ dysrhythmias (s/p ablation in 2014) Supra Ventricular Tachycardia  Rhythm:regular Rate:Normal     Neuro/Psych  Headaches, PSYCHIATRIC DISORDERS Anxiety Depression Hearing loss  Neuromuscular disease negative psych ROS   GI/Hepatic hiatal hernia, GERD  ,(+) Hepatitis -Patient complaining of lump in her throat and ear ache   Endo/Other  negative endocrine ROS  Renal/GU negative Renal ROS  negative genitourinary   Musculoskeletal  (+) Arthritis , Fibromyalgia -  Abdominal Normal abdominal exam  (+)   Peds  Hematology  (+) anemia ,   Anesthesia Other Findings   Reproductive/Obstetrics negative OB ROS                            Anesthesia Physical  Anesthesia Plan  ASA: IV  Anesthesia Plan: General   Post-op Pain Management:    Induction:   PONV Risk Score and Plan: Ondansetron, Dexamethasone, Midazolam and Treatment may vary due to age or medical condition  Airway Management Planned: Oral ETT  Additional Equipment:   Intra-op Plan:   Post-operative Plan: Extubation in OR  Informed Consent: I have reviewed the patients History and Physical, chart, labs and discussed the procedure including the  risks, benefits and alternatives for the proposed anesthesia with the patient or authorized representative who has indicated his/her understanding and acceptance.     Dental Advisory Given and Dental advisory given  Plan Discussed with: CRNA and Anesthesiologist  Anesthesia Plan Comments:       Anesthesia Quick Evaluation

## 2020-10-16 ENCOUNTER — Encounter (HOSPITAL_BASED_OUTPATIENT_CLINIC_OR_DEPARTMENT_OTHER): Payer: Self-pay | Admitting: Otolaryngology

## 2020-10-16 DIAGNOSIS — J449 Chronic obstructive pulmonary disease, unspecified: Secondary | ICD-10-CM | POA: Diagnosis not present

## 2020-10-16 DIAGNOSIS — J432 Centrilobular emphysema: Secondary | ICD-10-CM | POA: Diagnosis not present

## 2020-10-16 NOTE — Addendum Note (Signed)
Addendum  created 10/16/20 1340 by Reagann Dolce, Ernesta Amble, CRNA   Charge Capture section accepted

## 2020-10-17 ENCOUNTER — Other Ambulatory Visit: Payer: Self-pay

## 2020-10-18 LAB — SURGICAL PATHOLOGY

## 2020-10-22 DIAGNOSIS — D380 Neoplasm of uncertain behavior of larynx: Secondary | ICD-10-CM | POA: Diagnosis not present

## 2020-10-22 DIAGNOSIS — F17210 Nicotine dependence, cigarettes, uncomplicated: Secondary | ICD-10-CM | POA: Diagnosis not present

## 2020-10-22 DIAGNOSIS — R490 Dysphonia: Secondary | ICD-10-CM | POA: Diagnosis not present

## 2020-10-23 DIAGNOSIS — J410 Simple chronic bronchitis: Secondary | ICD-10-CM | POA: Diagnosis not present

## 2020-10-23 DIAGNOSIS — I2510 Atherosclerotic heart disease of native coronary artery without angina pectoris: Secondary | ICD-10-CM | POA: Diagnosis not present

## 2020-10-23 DIAGNOSIS — I480 Paroxysmal atrial fibrillation: Secondary | ICD-10-CM | POA: Diagnosis not present

## 2020-10-23 DIAGNOSIS — E782 Mixed hyperlipidemia: Secondary | ICD-10-CM | POA: Diagnosis not present

## 2020-10-23 DIAGNOSIS — I70219 Atherosclerosis of native arteries of extremities with intermittent claudication, unspecified extremity: Secondary | ICD-10-CM | POA: Diagnosis not present

## 2020-10-24 DIAGNOSIS — G629 Polyneuropathy, unspecified: Secondary | ICD-10-CM | POA: Diagnosis not present

## 2020-10-24 DIAGNOSIS — Z79899 Other long term (current) drug therapy: Secondary | ICD-10-CM | POA: Diagnosis not present

## 2020-10-24 DIAGNOSIS — G8929 Other chronic pain: Secondary | ICD-10-CM | POA: Diagnosis not present

## 2020-10-24 DIAGNOSIS — F17210 Nicotine dependence, cigarettes, uncomplicated: Secondary | ICD-10-CM | POA: Diagnosis not present

## 2020-10-24 DIAGNOSIS — M25571 Pain in right ankle and joints of right foot: Secondary | ICD-10-CM | POA: Diagnosis not present

## 2020-11-16 DIAGNOSIS — J449 Chronic obstructive pulmonary disease, unspecified: Secondary | ICD-10-CM | POA: Diagnosis not present

## 2020-11-16 DIAGNOSIS — J432 Centrilobular emphysema: Secondary | ICD-10-CM | POA: Diagnosis not present

## 2020-11-23 DIAGNOSIS — G8929 Other chronic pain: Secondary | ICD-10-CM | POA: Diagnosis not present

## 2020-11-23 DIAGNOSIS — F17210 Nicotine dependence, cigarettes, uncomplicated: Secondary | ICD-10-CM | POA: Diagnosis not present

## 2020-11-23 DIAGNOSIS — G629 Polyneuropathy, unspecified: Secondary | ICD-10-CM | POA: Diagnosis not present

## 2020-11-23 DIAGNOSIS — M25571 Pain in right ankle and joints of right foot: Secondary | ICD-10-CM | POA: Diagnosis not present

## 2020-11-23 DIAGNOSIS — Z79899 Other long term (current) drug therapy: Secondary | ICD-10-CM | POA: Diagnosis not present

## 2020-12-04 DIAGNOSIS — Z9981 Dependence on supplemental oxygen: Secondary | ICD-10-CM | POA: Diagnosis not present

## 2020-12-04 DIAGNOSIS — G4733 Obstructive sleep apnea (adult) (pediatric): Secondary | ICD-10-CM | POA: Diagnosis not present

## 2020-12-05 DIAGNOSIS — Z9981 Dependence on supplemental oxygen: Secondary | ICD-10-CM | POA: Insufficient documentation

## 2020-12-07 ENCOUNTER — Telehealth: Payer: Medicare HMO | Admitting: Nurse Practitioner

## 2020-12-07 DIAGNOSIS — N3000 Acute cystitis without hematuria: Secondary | ICD-10-CM | POA: Diagnosis not present

## 2020-12-07 MED ORDER — CEPHALEXIN 500 MG PO CAPS: 500.0000 mg | ORAL_CAPSULE | Freq: Two times a day (BID) | ORAL | 0 refills | Status: DC

## 2020-12-07 NOTE — Progress Notes (Signed)

## 2020-12-10 ENCOUNTER — Other Ambulatory Visit: Payer: Self-pay

## 2020-12-10 ENCOUNTER — Encounter: Payer: Self-pay | Admitting: Internal Medicine

## 2020-12-10 ENCOUNTER — Ambulatory Visit (INDEPENDENT_AMBULATORY_CARE_PROVIDER_SITE_OTHER): Payer: Medicare HMO | Admitting: Internal Medicine

## 2020-12-10 VITALS — BP 158/80 | HR 69 | Temp 97.8°F | Resp 18 | Ht 63.0 in | Wt 127.1 lb

## 2020-12-10 DIAGNOSIS — I70219 Atherosclerosis of native arteries of extremities with intermittent claudication, unspecified extremity: Secondary | ICD-10-CM | POA: Diagnosis not present

## 2020-12-10 DIAGNOSIS — G894 Chronic pain syndrome: Secondary | ICD-10-CM | POA: Diagnosis not present

## 2020-12-10 DIAGNOSIS — R159 Full incontinence of feces: Secondary | ICD-10-CM | POA: Diagnosis not present

## 2020-12-10 DIAGNOSIS — I10 Essential (primary) hypertension: Secondary | ICD-10-CM

## 2020-12-10 DIAGNOSIS — N3000 Acute cystitis without hematuria: Secondary | ICD-10-CM

## 2020-12-10 DIAGNOSIS — I739 Peripheral vascular disease, unspecified: Secondary | ICD-10-CM | POA: Diagnosis not present

## 2020-12-10 DIAGNOSIS — G4733 Obstructive sleep apnea (adult) (pediatric): Secondary | ICD-10-CM | POA: Diagnosis not present

## 2020-12-10 DIAGNOSIS — G8929 Other chronic pain: Secondary | ICD-10-CM | POA: Insufficient documentation

## 2020-12-10 DIAGNOSIS — I480 Paroxysmal atrial fibrillation: Secondary | ICD-10-CM | POA: Diagnosis not present

## 2020-12-10 LAB — POCT URINALYSIS DIP (CLINITEK)
Bilirubin, UA: NEGATIVE
Glucose, UA: NEGATIVE mg/dL
Ketones, POC UA: NEGATIVE mg/dL
Leukocytes, UA: NEGATIVE
Nitrite, UA: NEGATIVE
Spec Grav, UA: 1.02 (ref 1.010–1.025)
Urobilinogen, UA: 0.2 E.U./dL
pH, UA: 5.5 (ref 5.0–8.0)

## 2020-12-10 MED ORDER — LOSARTAN POTASSIUM 25 MG PO TABS: 25.0000 mg | ORAL_TABLET | Freq: Every day | ORAL | 0 refills | Status: DC

## 2020-12-10 NOTE — Progress Notes (Signed)
amb re 

## 2020-12-10 NOTE — Assessment & Plan Note (Signed)
BP Readings from Last 1 Encounters:  12/10/20 (!) 158/80   uncontrolled with Metoprolol 12.5 mg QD Active infection and pain can be a contributing factor for mild elevation in BP as well. Added Losartan 25 mg QD Counseled for compliance with the medications Advised DASH diet and moderate exercise/walking, at least 150 mins/week

## 2020-12-10 NOTE — Patient Instructions (Addendum)
Please contact Gastroenterologist for stool leakage at times. It could be a sign of chronic constipation.  Please start taking Losartan as prescribed for hypertension.  Please contact Cardiologist for change in blood pressure medication and leg claudication symptoms.  Please get fasting blood tests done before the next visit.

## 2020-12-10 NOTE — Assessment & Plan Note (Signed)
Intermittent episodes at nighttime Could be a sign of chronic constipation due to chronic opioids On Miralax Advised to contact GI for discussing these symptoms
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
## 2020-12-10 NOTE — Assessment & Plan Note (Signed)
UA negative for LE and nitrite. Has started taking Cephalexin after e-visit 2 days ago. Continue antibiotics for now Will check urine culture

## 2020-12-10 NOTE — Assessment & Plan Note (Signed)
S/p ablation On Flecainide and Metoprolol Follows up with Cardiologist

## 2020-12-10 NOTE — Assessment & Plan Note (Addendum)
Planning to get Inspire device for OSA Sleep study in 2015 showed persistent sleep apnea. Last O2 sat study in 2021.

## 2020-12-10 NOTE — Assessment & Plan Note (Addendum)
On Plavix and Crestor C/o recent increase in frequency of claudication symptoms Referred to Vascular surgery

## 2020-12-10 NOTE — Assessment & Plan Note (Signed)
C/o shoulder and knee pain Unlikely related to vaccine Under chronic pain management

## 2020-12-12 ENCOUNTER — Ambulatory Visit: Payer: Medicare HMO | Admitting: Family Medicine

## 2020-12-12 ENCOUNTER — Ambulatory Visit: Payer: Medicare HMO | Admitting: Internal Medicine

## 2020-12-12 LAB — URINE CULTURE: Organism ID, Bacteria: NO GROWTH

## 2020-12-17 DIAGNOSIS — J449 Chronic obstructive pulmonary disease, unspecified: Secondary | ICD-10-CM | POA: Diagnosis not present

## 2020-12-17 DIAGNOSIS — J432 Centrilobular emphysema: Secondary | ICD-10-CM | POA: Diagnosis not present

## 2020-12-18 DIAGNOSIS — H539 Unspecified visual disturbance: Secondary | ICD-10-CM | POA: Diagnosis not present

## 2020-12-18 DIAGNOSIS — Z961 Presence of intraocular lens: Secondary | ICD-10-CM | POA: Diagnosis not present

## 2020-12-18 DIAGNOSIS — H04123 Dry eye syndrome of bilateral lacrimal glands: Secondary | ICD-10-CM | POA: Diagnosis not present

## 2020-12-18 DIAGNOSIS — H02883 Meibomian gland dysfunction of right eye, unspecified eyelid: Secondary | ICD-10-CM | POA: Diagnosis not present

## 2020-12-18 DIAGNOSIS — G4733 Obstructive sleep apnea (adult) (pediatric): Secondary | ICD-10-CM | POA: Diagnosis not present

## 2020-12-18 DIAGNOSIS — H02886 Meibomian gland dysfunction of left eye, unspecified eyelid: Secondary | ICD-10-CM | POA: Diagnosis not present

## 2020-12-18 DIAGNOSIS — H1013 Acute atopic conjunctivitis, bilateral: Secondary | ICD-10-CM | POA: Diagnosis not present

## 2020-12-25 DIAGNOSIS — M25571 Pain in right ankle and joints of right foot: Secondary | ICD-10-CM | POA: Diagnosis not present

## 2020-12-25 DIAGNOSIS — G629 Polyneuropathy, unspecified: Secondary | ICD-10-CM | POA: Diagnosis not present

## 2020-12-25 DIAGNOSIS — Z79899 Other long term (current) drug therapy: Secondary | ICD-10-CM | POA: Diagnosis not present

## 2020-12-25 DIAGNOSIS — G8929 Other chronic pain: Secondary | ICD-10-CM | POA: Diagnosis not present

## 2021-01-07 ENCOUNTER — Other Ambulatory Visit (HOSPITAL_COMMUNITY): Payer: Self-pay | Admitting: Vascular Surgery

## 2021-01-07 ENCOUNTER — Other Ambulatory Visit: Payer: Self-pay

## 2021-01-07 ENCOUNTER — Ambulatory Visit (INDEPENDENT_AMBULATORY_CARE_PROVIDER_SITE_OTHER): Payer: Medicare HMO

## 2021-01-07 ENCOUNTER — Ambulatory Visit (INDEPENDENT_AMBULATORY_CARE_PROVIDER_SITE_OTHER): Payer: Medicare HMO | Admitting: Vascular Surgery

## 2021-01-07 ENCOUNTER — Encounter: Payer: Self-pay | Admitting: Vascular Surgery

## 2021-01-07 VITALS — BP 143/79 | HR 63 | Temp 98.4°F | Resp 14 | Ht 63.0 in | Wt 127.0 lb

## 2021-01-07 DIAGNOSIS — M25562 Pain in left knee: Secondary | ICD-10-CM | POA: Diagnosis not present

## 2021-01-07 DIAGNOSIS — I739 Peripheral vascular disease, unspecified: Secondary | ICD-10-CM | POA: Diagnosis not present

## 2021-01-07 DIAGNOSIS — M25561 Pain in right knee: Secondary | ICD-10-CM | POA: Diagnosis not present
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
## 2021-01-14 DIAGNOSIS — J432 Centrilobular emphysema: Secondary | ICD-10-CM | POA: Diagnosis not present

## 2021-01-14 DIAGNOSIS — J449 Chronic obstructive pulmonary disease, unspecified: Secondary | ICD-10-CM | POA: Diagnosis not present

## 2021-01-24 DIAGNOSIS — M25571 Pain in right ankle and joints of right foot: Secondary | ICD-10-CM | POA: Diagnosis not present

## 2021-01-24 DIAGNOSIS — G629 Polyneuropathy, unspecified: Secondary | ICD-10-CM | POA: Diagnosis not present

## 2021-01-24 DIAGNOSIS — G8929 Other chronic pain: Secondary | ICD-10-CM | POA: Diagnosis not present

## 2021-01-24 DIAGNOSIS — Z79899 Other long term (current) drug therapy: Secondary | ICD-10-CM | POA: Diagnosis not present

## 2021-02-04 ENCOUNTER — Ambulatory Visit (HOSPITAL_COMMUNITY)
Admission: RE | Admit: 2021-02-04 | Discharge: 2021-02-04 | Disposition: A | Discharge status: Home / Self Care | Payer: Medicare HMO | Source: Ambulatory Visit | Attending: Family Medicine | Admitting: Family Medicine

## 2021-02-04 DIAGNOSIS — Z780 Asymptomatic menopausal state: Secondary | ICD-10-CM | POA: Insufficient documentation

## 2021-02-04 DIAGNOSIS — M85831 Other specified disorders of bone density and structure, right forearm: Secondary | ICD-10-CM | POA: Diagnosis not present

## 2021-02-04 DIAGNOSIS — M85851 Other specified disorders of bone density and structure, right thigh: Secondary | ICD-10-CM | POA: Diagnosis not present

## 2021-02-07 ENCOUNTER — Telehealth: Payer: Self-pay

## 2021-02-07 NOTE — Telephone Encounter (Signed)
Thank you. She should be taking Valsartan in that case and stop taking Losartan. Please advise her to bring her home meds in the next appointment.

## 2021-02-07 NOTE — Telephone Encounter (Signed)
Pt says that she is currently on Valsartan 80mg  prescribed by her cardiologist. She seen you in January but does not think you were aware she was on Valsatan-as it is not in her med list. You put her on Losartan 25mg  and she has been taking both of these daily. She recently tried getting refills and her pharmacy mentioned she should not be on both of these medications together. She wants to know what you advise or if she should call her cardiologist? I informed her that according to the AVS from her January appointment with you she was to call her cardiologist regarding the change to her b/p medication anyways and so she is going to call them today.

## 2021-02-07 NOTE — Telephone Encounter (Signed)
Pt informed

## 2021-02-07 NOTE — Telephone Encounter (Signed)
Patient called left voice mail needs some clarification on medication.  Please return call [phone].

## 2021-02-12 ENCOUNTER — Telehealth: Payer: Self-pay

## 2021-02-12 NOTE — Telephone Encounter (Signed)
She can have discussion about Gabapentin in the next visit.

## 2021-02-12 NOTE — Telephone Encounter (Signed)
Pt wanted to let you know that she went to her vein and vascular referral appt and they said everything was normal and that she would probably need further investigation from another provider and they didn't need to see her anymore. She is still having the pain and weakness in her legs. Wants to know what you advise?

## 2021-02-12 NOTE — Telephone Encounter (Signed)
Pt informed

## 2021-02-12 NOTE — Progress Notes (Signed)
Pls review this comment with pt

## 2021-02-14 DIAGNOSIS — J449 Chronic obstructive pulmonary disease, unspecified: Secondary | ICD-10-CM | POA: Diagnosis not present

## 2021-02-14 DIAGNOSIS — J432 Centrilobular emphysema: Secondary | ICD-10-CM | POA: Diagnosis not present

## 2021-02-15 ENCOUNTER — Ambulatory Visit (INDEPENDENT_AMBULATORY_CARE_PROVIDER_SITE_OTHER): Payer: Medicare HMO | Admitting: Internal Medicine

## 2021-02-15 ENCOUNTER — Encounter: Payer: Self-pay | Admitting: Internal Medicine

## 2021-02-15 ENCOUNTER — Other Ambulatory Visit: Payer: Self-pay

## 2021-02-15 VITALS — BP 171/76 | HR 74 | Resp 16 | Ht 63.0 in | Wt 127.1 lb

## 2021-02-15 DIAGNOSIS — N3000 Acute cystitis without hematuria: Secondary | ICD-10-CM

## 2021-02-15 LAB — POCT URINALYSIS DIP (CLINITEK)
Bilirubin, UA: NEGATIVE
Glucose, UA: NEGATIVE mg/dL
Ketones, POC UA: NEGATIVE mg/dL
Leukocytes, UA: NEGATIVE
Nitrite, UA: NEGATIVE
POC PROTEIN,UA: 30 — AB
Spec Grav, UA: 1.02 (ref 1.010–1.025)
Urobilinogen, UA: 0.2 E.U./dL
pH, UA: 6 (ref 5.0–8.0)

## 2021-02-15 MED ORDER — CEPHALEXIN 500 MG PO CAPS: 500.0000 mg | ORAL_CAPSULE | Freq: Two times a day (BID) | ORAL | 0 refills | Status: DC

## 2021-02-15 NOTE — Patient Instructions (Signed)
Urinary Tract Infection, Adult A urinary tract infection (UTI) is an infection of any part of the urinary tract. The urinary tract includes:  The kidneys.  The ureters.  The bladder.  The urethra. These organs make, store, and get rid of pee (urine) in the body. What are the causes? This infection is caused by germs (bacteria) in your genital area. These germs grow and cause swelling (inflammation) of your urinary tract. What increases the risk? The following factors may make you more likely to develop this condition:  Using a small, thin tube (catheter) to drain pee.  Not being able to control when you pee or poop (incontinence).  Being female. If you are female, these things can increase the risk: ? Using these methods to prevent pregnancy:  A medicine that kills sperm (spermicide).  A device that blocks sperm (diaphragm). ? Having low levels of a female hormone (estrogen). ? Being pregnant. You are more likely to develop this condition if:  You have genes that add to your risk.  You are sexually active.  You take antibiotic medicines.  You have trouble peeing because of: ? A prostate that is bigger than normal, if you are female. ? A blockage in the part of your body that drains pee from the bladder. ? A kidney stone. ? A nerve condition that affects your bladder. ? Not getting enough to drink. ? Not peeing often enough.  You have other conditions, such as: ? Diabetes. ? A weak disease-fighting system (immune system). ? Sickle cell disease. ? Gout. ? Injury of the spine. What are the signs or symptoms? Symptoms of this condition include:  Needing to pee right away.  Peeing small amounts often.  Pain or burning when peeing.  Blood in the pee.  Pee that smells bad or not like normal.  Trouble peeing.  Pee that is cloudy.  Fluid coming from the vagina, if you are female.  Pain in the belly or lower back. Other symptoms include:  Vomiting.  Not  feeling hungry.  Feeling mixed up (confused). This may be the first symptom in older adults.  Being tired and grouchy (irritable).  A fever.  Watery poop (diarrhea). How is this treated?  Taking antibiotic medicine.  Taking other medicines.  Drinking enough water. In some cases, you may need to see a specialist. Follow these instructions at home: Medicines  Take over-the-counter and prescription medicines only as told by your doctor.  If you were prescribed an antibiotic medicine, take it as told by your doctor. Do not stop taking it even if you start to feel better. General instructions  Make sure you: ? Pee until your bladder is empty. ? Do not hold pee for a long time. ? Empty your bladder after sex. ? Wipe from front to back after peeing or pooping if you are a female. Use each tissue one time when you wipe.  Drink enough fluid to keep your pee pale yellow.  Keep all follow-up visits.   Contact a doctor if:  You do not get better after 1-2 days.  Your symptoms go away and then come back. Get help right away if:  You have very bad back pain.  You have very bad pain in your lower belly.  You have a fever.  You have chills.  You feeling like you will vomit or you vomit. Summary  A urinary tract infection (UTI) is an infection of any part of the urinary tract.  This condition is caused by   germs in your genital area.  There are many risk factors for a UTI.  Treatment includes antibiotic medicines.  Drink enough fluid to keep your pee pale yellow. This information is not intended to replace advice given to you by your health care provider. Make sure you discuss any questions you have with your health care provider. Document Revised: 06/08/2020 Document Reviewed: 06/08/2020 Elsevier Patient Education  2021 [employer]  
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
## 2021-02-18 LAB — URINE CULTURE

## 2021-02-20 DIAGNOSIS — M25551 Pain in right hip: Secondary | ICD-10-CM | POA: Diagnosis not present

## 2021-02-20 DIAGNOSIS — M25552 Pain in left hip: Secondary | ICD-10-CM | POA: Diagnosis not present

## 2021-02-20 DIAGNOSIS — Z79899 Other long term (current) drug therapy: Secondary | ICD-10-CM | POA: Diagnosis not present

## 2021-02-20 DIAGNOSIS — G8929 Other chronic pain: Secondary | ICD-10-CM | POA: Diagnosis not present

## 2021-02-20 DIAGNOSIS — G629 Polyneuropathy, unspecified: Secondary | ICD-10-CM | POA: Diagnosis not present

## 2021-02-20 DIAGNOSIS — M25571 Pain in right ankle and joints of right foot: Secondary | ICD-10-CM | POA: Diagnosis not present

## 2021-02-21 ENCOUNTER — Other Ambulatory Visit: Payer: Self-pay

## 2021-02-21 ENCOUNTER — Other Ambulatory Visit: Payer: Self-pay | Admitting: Internal Medicine

## 2021-02-21 ENCOUNTER — Telehealth (INDEPENDENT_AMBULATORY_CARE_PROVIDER_SITE_OTHER): Payer: Medicare HMO | Admitting: Family Medicine

## 2021-02-21 ENCOUNTER — Encounter: Payer: Self-pay | Admitting: Family Medicine

## 2021-02-21 DIAGNOSIS — R1032 Left lower quadrant pain: Secondary | ICD-10-CM

## 2021-02-21 DIAGNOSIS — I10 Essential (primary) hypertension: Secondary | ICD-10-CM | POA: Diagnosis not present

## 2021-02-21 DIAGNOSIS — R109 Unspecified abdominal pain: Secondary | ICD-10-CM | POA: Insufficient documentation

## 2021-02-21 DIAGNOSIS — R7301 Impaired fasting glucose: Secondary | ICD-10-CM | POA: Diagnosis not present

## 2021-02-21 DIAGNOSIS — I480 Paroxysmal atrial fibrillation: Secondary | ICD-10-CM | POA: Diagnosis not present

## 2021-02-21 DIAGNOSIS — G4733 Obstructive sleep apnea (adult) (pediatric): Secondary | ICD-10-CM | POA: Diagnosis not present

## 2021-02-21 MED ORDER — METRONIDAZOLE 500 MG PO TABS: 500.0000 mg | ORAL_TABLET | Freq: Three times a day (TID) | ORAL | 0 refills | Status: DC

## 2021-02-21 MED ORDER — CIPROFLOXACIN HCL 500 MG PO TABS: 500.0000 mg | ORAL_TABLET | Freq: Two times a day (BID) | ORAL | 0 refills | Status: DC

## 2021-02-21 NOTE — Patient Instructions (Signed)
F/u in office with mD , next week Monday or Tuesday, call if you need me sooner  Ciprofloxacin and flagyll are prescribed to treat abdominal pain presumed due to diverticulitis  PLEASE if pain worsens, or does not improve, or if you develop fevr, chills, bloody stool you need to go directly to the ED  Liquid diet only for the next 3 to 5 days  Thanks for choosing [facility], we consider it a privelige to serve you.

## 2021-02-21 NOTE — Progress Notes (Signed)
Virtual Visit via Telephone Note  I connected with [name] on 02/21/21 at  2:00 PM EDT by telephone and verified that I am speaking with the correct person using two identifiers.  Location: Patient: home  Provider: office   I discussed the limitations, risks, security and privacy concerns of performing an evaluation and management service by telephone and the availability of in person appointments. I also discussed with the patient that there may be a patient responsible charge related to this service. The patient expressed understanding and agreed to proceed.   History of Present Illness: 2 day h/o LLQ pain rated at a 10, has been having bouts of loose stool, no fever or chills, no blood in stool, 3 BM today, loose , no mucus, states has had diverticulitis in the past and this feels as tho what is going on. Went to an out pt facility in [place] earlier and was told that they did not manage diverticulitis . Needs to attend a funeral out of town in 2 days and is concerned. Understands , after reviewing with her the potential danger of sepsis and bowel perf and is advised to go to the Ed if worsening Observations/Objective: BP 132/70   Ht 5\' 3"  (1.6 m)   Wt 127 lb (57.6 kg)   BMI 22.50 kg/m  Good communication with no confusion and intact memory. Alert and oriented x 3 No signs of respiratory distress during speech '  Assessment and Plan:  Abdominal pain Acute abdominal pain, likely diverticulitis Antibiotics prescribed, warned re s/s needing ED eval F/u in next 3 to 5 days in office   Follow Up Instructions:    I discussed the assessment and treatment plan with the patient. The patient was provided an opportunity to ask questions and all were answered. The patient agreed with the plan and demonstrated an understanding of the instructions.   The patient was advised to call back or seek an in-person evaluation if the symptoms worsen or if the condition fails to improve  as anticipated.  I provided 9 minutes of non-face-to-face time during this encounter.   [name], MD

## 2021-02-22 LAB — COMPLETE METABOLIC PANEL WITH GFR
AG Ratio: 2.2 (calc) (ref 1.0–2.5)
ALT: 16 U/L (ref 6–29)
AST: 18 U/L (ref 10–35)
Albumin: 4.6 g/dL (ref 3.6–5.1)
Alkaline phosphatase (APISO): 65 U/L (ref 37–153)
BUN: 13 mg/dL (ref 7–25)
CO2: 27 mmol/L (ref 20–32)
Calcium: 9.7 mg/dL (ref 8.6–10.4)
Chloride: 102 mmol/L (ref 98–110)
Creat: 0.73 mg/dL (ref 0.60–0.93)
GFR, Est African American: 96 mL/min/{1.73_m2} (ref >=60)
GFR, Est Non African American: 83 mL/min/{1.73_m2} (ref >=60)
Globulin: 2.1 g/dL (calc) (ref 1.9–3.7)
Glucose, Bld: 98 mg/dL (ref 65–99)
Potassium: 4.3 mmol/L (ref 3.5–5.3)
Sodium: 138 mmol/L (ref 135–146)
Total Bilirubin: 0.5 mg/dL (ref 0.2–1.2)
Total Protein: 6.7 g/dL (ref 6.1–8.1)

## 2021-02-22 LAB — CBC WITH DIFFERENTIAL/PLATELET
Absolute Monocytes: 1203 cells/uL — ABNORMAL HIGH (ref 200–950)
Basophils Absolute: 50 cells/uL (ref 0–200)
Basophils Relative: 0.4 %
Eosinophils Absolute: 186 cells/uL (ref 15–500)
Eosinophils Relative: 1.5 %
HCT: 42 % (ref 35.0–45.0)
Hemoglobin: 14.1 g/dL (ref 11.7–15.5)
Lymphs Abs: 2244 cells/uL (ref 850–3900)
MCH: 31.5 pg (ref 27.0–33.0)
MCHC: 33.6 g/dL (ref 32.0–36.0)
MCV: 94 fL (ref 80.0–100.0)
MPV: 10.1 fL (ref 7.5–12.5)
Monocytes Relative: 9.7 %
Neutro Abs: 8717 cells/uL — ABNORMAL HIGH (ref 1500–7800)
Neutrophils Relative %: 70.3 %
Platelets: 243 10*3/uL (ref 140–400)
RBC: 4.47 10*6/uL (ref 3.80–5.10)
RDW: 12.5 % (ref 11.0–15.0)
Total Lymphocyte: 18.1 %
WBC: 12.4 10*3/uL — ABNORMAL HIGH (ref 3.8–10.8)

## 2021-02-22 LAB — TSH: TSH: 1.29 mIU/L (ref 0.40–4.50)

## 2021-02-22 LAB — LIPID PANEL
Cholesterol: 184 mg/dL (ref <200)
HDL: 71 mg/dL (ref >=50)
LDL Cholesterol (Calc): 86 mg/dL (calc)
Non-HDL Cholesterol (Calc): 113 mg/dL (calc) (ref <130)
Total CHOL/HDL Ratio: 2.6 (calc) (ref <5.0)
Triglycerides: 175 mg/dL — ABNORMAL HIGH (ref <150)

## 2021-02-22 LAB — HEMOGLOBIN A1C W/OUT EAG: Hgb A1c MFr Bld: 5.7 % of total Hgb — ABNORMAL HIGH (ref <5.7)

## 2021-02-23 ENCOUNTER — Telehealth: Payer: Medicare HMO | Admitting: Physician Assistant

## 2021-02-23 DIAGNOSIS — E860 Dehydration: Secondary | ICD-10-CM

## 2021-02-23 DIAGNOSIS — R197 Diarrhea, unspecified: Secondary | ICD-10-CM

## 2021-02-23 NOTE — Progress Notes (Signed)
Based on what you shared with me, I feel your condition warrants further evaluation and I recommend that you be seen in a face to face office visit.   I would recommend you seek emergent evaluation as it appears you may be getting dehydrated and if you are already on an antibiotic without improvement there is not much treatment available other than IV antibiotics only available at the hospital.  NOTE: If you entered your credit card information for this eVisit, you will not be charged. You may see a "hold" on your card for the $35 but that hold will drop off and you will not have a charge processed.   If you are having a true medical emergency please call 911.      For an urgent face to face visit, [facility] has six urgent care centers for your convenience:     [facility] [place] Get Driving Directions [phone] [place], [place] [address] . 8 am - 4 pm Monday - Friday    [place] [facility]) Get Driving Directions [phone] [address] [place], [place] [address] . 8 am to 8 pm Monday-Friday . 10 am to 6 pm [facility]) Get Driving Directions [phone]  [address] [place],  [place]  [address] . 8 am to 8 pm Monday-Friday . 8 am to 4 pm [facility] Get Driving Directions [phone] [place], [place], [place] [address] . 8 am to 8 pm Monday-Friday . 8 am to 4 pm [facility] at [facility] Get Driving Directions  [phone] [address] [place], [place] [address] . 8 am to 8 pm Monday-Friday . 8 am to 4 pm [facility] at [place] Get Driving Directions [phone] [address], [place], [place] [address] . 8 am to 8 pm Monday-Friday . 8 am to  4 pm Saturday-Sunday     Your MyChart E-visit questionnaire answers were reviewed by a board certified advanced clinical practitioner to complete your personal care plan based on your specific symptoms.  Thank you for using e-Visits.   I provided 6 minutes of non face-to-face time during this encounter for chart review and documentation.

## 2021-02-24 ENCOUNTER — Encounter: Payer: Self-pay | Admitting: Family Medicine

## 2021-02-24 NOTE — Assessment & Plan Note (Signed)
Acute abdominal pain, likely diverticulitis Antibiotics prescribed, warned re s/s needing ED eval F/u in next 3 to 5 days in office

## 2021-02-25 ENCOUNTER — Other Ambulatory Visit: Payer: Self-pay

## 2021-02-25 ENCOUNTER — Emergency Department (HOSPITAL_COMMUNITY)
Admission: EM | Admit: 2021-02-25 | Discharge: 2021-02-25 | Disposition: A | Discharge status: Home / Self Care | Payer: Medicare HMO | Attending: Emergency Medicine | Admitting: Emergency Medicine

## 2021-02-25 ENCOUNTER — Emergency Department (HOSPITAL_COMMUNITY): Payer: Medicare HMO

## 2021-02-25 ENCOUNTER — Encounter: Payer: Self-pay | Admitting: Family Medicine

## 2021-02-25 ENCOUNTER — Ambulatory Visit (INDEPENDENT_AMBULATORY_CARE_PROVIDER_SITE_OTHER): Payer: Medicare HMO | Admitting: Family Medicine

## 2021-02-25 ENCOUNTER — Encounter (HOSPITAL_COMMUNITY): Payer: Self-pay | Admitting: *Deleted

## 2021-02-25 VITALS — BP 128/71 | HR 75 | Resp 16 | Ht 63.0 in | Wt 125.0 lb

## 2021-02-25 DIAGNOSIS — F17210 Nicotine dependence, cigarettes, uncomplicated: Secondary | ICD-10-CM | POA: Insufficient documentation

## 2021-02-25 DIAGNOSIS — Z85118 Personal history of other malignant neoplasm of bronchus and lung: Secondary | ICD-10-CM | POA: Diagnosis not present

## 2021-02-25 DIAGNOSIS — Z7902 Long term (current) use of antithrombotics/antiplatelets: Secondary | ICD-10-CM | POA: Insufficient documentation

## 2021-02-25 DIAGNOSIS — R110 Nausea: Secondary | ICD-10-CM | POA: Diagnosis not present

## 2021-02-25 DIAGNOSIS — I10 Essential (primary) hypertension: Secondary | ICD-10-CM

## 2021-02-25 DIAGNOSIS — K5792 Diverticulitis of intestine, part unspecified, without perforation or abscess without bleeding: Secondary | ICD-10-CM | POA: Insufficient documentation

## 2021-02-25 DIAGNOSIS — R1032 Left lower quadrant pain: Secondary | ICD-10-CM | POA: Diagnosis not present

## 2021-02-25 DIAGNOSIS — R109 Unspecified abdominal pain: Secondary | ICD-10-CM | POA: Diagnosis not present

## 2021-02-25 DIAGNOSIS — Z79899 Other long term (current) drug therapy: Secondary | ICD-10-CM | POA: Insufficient documentation

## 2021-02-25 DIAGNOSIS — J449 Chronic obstructive pulmonary disease, unspecified: Secondary | ICD-10-CM | POA: Insufficient documentation

## 2021-02-25 DIAGNOSIS — I2510 Atherosclerotic heart disease of native coronary artery without angina pectoris: Secondary | ICD-10-CM | POA: Insufficient documentation

## 2021-02-25 LAB — BASIC METABOLIC PANEL
Anion gap: 10 (ref 5–15)
BUN: 9 mg/dL (ref 8–23)
CO2: 27 mmol/L (ref 22–32)
Calcium: 9.4 mg/dL (ref 8.9–10.3)
Chloride: 97 mmol/L — ABNORMAL LOW (ref 98–111)
Creatinine, Ser: 0.68 mg/dL (ref 0.44–1.00)
GFR, Estimated: >60 mL/min (ref >60)
Glucose, Bld: 106 mg/dL — ABNORMAL HIGH (ref 70–99)
Potassium: 3.8 mmol/L (ref 3.5–5.1)
Sodium: 134 mmol/L — ABNORMAL LOW (ref 135–145)

## 2021-02-25 LAB — CBC WITH DIFFERENTIAL/PLATELET
Abs Immature Granulocytes: 0.04 10*3/uL (ref 0.00–0.07)
Basophils Absolute: 0 10*3/uL (ref 0.0–0.1)
Basophils Relative: 0 %
Eosinophils Absolute: 0.2 10*3/uL (ref 0.0–0.5)
Eosinophils Relative: 2 %
HCT: 45.9 % (ref 36.0–46.0)
Hemoglobin: 15.3 g/dL — ABNORMAL HIGH (ref 12.0–15.0)
Immature Granulocytes: 0 %
Lymphocytes Relative: 19 %
Lymphs Abs: 1.8 10*3/uL (ref 0.7–4.0)
MCH: 32.6 pg (ref 26.0–34.0)
MCHC: 33.3 g/dL (ref 30.0–36.0)
MCV: 97.7 fL (ref 80.0–100.0)
Monocytes Absolute: 0.9 10*3/uL (ref 0.1–1.0)
Monocytes Relative: 9 %
Neutro Abs: 6.5 10*3/uL (ref 1.7–7.7)
Neutrophils Relative %: 70 %
Platelets: 274 10*3/uL (ref 150–400)
RBC: 4.7 MIL/uL (ref 3.87–5.11)
RDW: 12.9 % (ref 11.5–15.5)
WBC: 9.4 10*3/uL (ref 4.0–10.5)
nRBC: 0 % (ref 0.0–0.2)

## 2021-02-25 MED ORDER — AMOXICILLIN-POT CLAVULANATE 875-125 MG PO TABS: 1.0000 | ORAL_TABLET | Freq: Two times a day (BID) | ORAL | 0 refills | Status: DC

## 2021-02-25 MED ORDER — ONDANSETRON HCL 4 MG/2ML IJ SOLN: 4.0000 mg | Freq: Once | INTRAMUSCULAR | Status: DC

## 2021-02-25 MED ORDER — SODIUM CHLORIDE 0.9 % IV BOLUS: 500.0000 mL | Freq: Once | INTRAVENOUS | Status: DC

## 2021-02-25 MED ORDER — FENTANYL CITRATE (PF) 100 MCG/2ML IJ SOLN: 100.0000 ug | Freq: Once | INTRAMUSCULAR | Status: DC

## 2021-02-25 MED ORDER — IOHEXOL 300 MG/ML  SOLN
100.0000 mL | Freq: Once | INTRAMUSCULAR | Status: AC | PRN
  Administered 2021-02-25: 100 mL via INTRAVENOUS

## 2021-02-25 NOTE — Progress Notes (Signed)
   [name]     MRN: [id number]      DOB: Jan 27, 1951   HPI Ms. Hauss is here for follow up opf LLQ pain x 2 weeks, started  Antibiotics 5 days ago, had to stop flagyll was allergic, still c/o pain. Rated at a baseline 8 and at times over 10. No solidoral intake, increasingly weak ROS Denies recent fever or chills. Denies sinus pressure, nasal congestion, ear pain or sore throat. Denies chest congestion, productive cough or wheezing. Denies chest pains, palpitations and leg swelling Den nausea, vomiting,diarrhea or constipation.  No blood or mucus in stool Denies dysuria, frequency, hesitancy or incontinence. C/o bilateral hip pain , right worse than left had an X ray and is still awaiting results. Denies headaches, seizures, numbness, or tingling. Denies depression, anxiety or insomnia. Denies skin break down or rash.   PE  BP 128/71   Pulse 75   Resp 16   Ht 5\' 3"  (1.6 m)   Wt 125 lb (56.7 kg)   SpO2 93%   BMI 22.14 kg/m   Patient alert and oriented and in no cardiopulmonary distress.  HEENT: No facial asymmetry, EOMI,     Neck supple .  Chest: Clear to auscultation bilaterally.decreased air entry  CVS: S1, S2 no murmurs, no S3.Regular rate.  ABD: Soft , LLL tenderness with rebound   Ext: No edema  MS: Adequate ROM spine, shoulders,reduced in  hips and normal in   knees.  Skin: Intact, no ulcerations or rash noted.  Psych: Good eye contact, normal affect. Memory intact not anxious or depressed appearing.  CNS: CN 2-12 intact, power,  normal throughout.no focal deficits noted.   Assessment & Plan  Acute diverticulitis 2 week h/o LLQ pain with rebound tenderness, intolerant of flagyll, poor orl intake with increased weakness, send to ED for eval  Cigarette smoker Asked:confirms currently smokes cigarettes Assess: Unwilling to set a quit date, but is cutting back Advise: needs to QUIT to reduce risk of cancer, cardio and cerebrovascular disease Assist:  counseled for 5 minutes and literature provided Arrange: follow up in 2 to 4 months   Essential hypertension Controlled, no change in medication

## 2021-02-25 NOTE — Assessment & Plan Note (Signed)
2 week h/o LLQ pain with rebound tenderness, intolerant of flagyll, poor orl intake with increased weakness, send to ED for eval

## 2021-02-25 NOTE — Assessment & Plan Note (Signed)
Asked:confirms currently smokes cigarettes °Assess: Unwilling to set a quit date, but is cutting back °Advise: needs to QUIT to reduce risk of cancer, cardio and cerebrovascular disease °Assist: counseled for 5 minutes and literature provided °Arrange: follow up in 2 to 4 months ° °

## 2021-02-25 NOTE — Assessment & Plan Note (Signed)
Controlled, no change in medication  

## 2021-02-25 NOTE — Patient Instructions (Signed)
You need to go directly to the eD for further evaluation of your LLQ pain , likley due todiverticulitis  I have spoken with the eD Physician  Please work on cutting back smoking to improve your health  Ordering Physician can discuss your x ray results  Thanks for choosing [facility], we consider it a privelige to serve you.

## 2021-02-25 NOTE — ED Triage Notes (Signed)
C/o abdominal pain for over 10 days, history of diverticulitis

## 2021-02-25 NOTE — ED Notes (Signed)
Pt up to nurse's desk, asked for IV to be removed, to be given discharge paperwork, and to leave. Declined repeat VS and to sign the electronic discharge form. Explained DC instructions, she expressed understanding.
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
## 2021-02-25 NOTE — Discharge Instructions (Addendum)
You were seen in the emergency department for left lower quadrant abdominal pain.  You had blood work and a CAT scan of your abdomen and pelvis that did not show an obvious explanation for your symptoms.  Please stop your ciprofloxacin and start the Augmentin.  Follow-up with your doctor.  Return to the emergency department if any worsening or concerning symptoms

## 2021-02-25 NOTE — ED Notes (Signed)
Patient transported to CT 

## 2021-02-26 ENCOUNTER — Ambulatory Visit: Payer: Medicare HMO | Admitting: Family Medicine

## 2021-03-04 ENCOUNTER — Encounter: Payer: Self-pay | Admitting: Family Medicine

## 2021-03-04 ENCOUNTER — Other Ambulatory Visit: Payer: Self-pay

## 2021-03-04 ENCOUNTER — Telehealth (INDEPENDENT_AMBULATORY_CARE_PROVIDER_SITE_OTHER): Payer: Medicare HMO | Admitting: Family Medicine

## 2021-03-04 VITALS — Ht 63.0 in | Wt 124.0 lb

## 2021-03-04 DIAGNOSIS — M546 Pain in thoracic spine: Secondary | ICD-10-CM | POA: Diagnosis not present

## 2021-03-04 DIAGNOSIS — F17210 Nicotine dependence, cigarettes, uncomplicated: Secondary | ICD-10-CM

## 2021-03-04 DIAGNOSIS — G8929 Other chronic pain: Secondary | ICD-10-CM | POA: Diagnosis not present

## 2021-03-04 DIAGNOSIS — M549 Dorsalgia, unspecified: Secondary | ICD-10-CM | POA: Insufficient documentation

## 2021-03-04 NOTE — Patient Instructions (Signed)
F/U as before, call if you need me sooner  Good that your  stomach is better.  You are referred for X rays of your mid and lower back, and also an MRI of your lower back  Need to work on quitting smoking, call [id number] for help  Thanks for choosing [facility], we consider it a privelige to serve you.

## 2021-03-04 NOTE — Assessment & Plan Note (Signed)
Asked:confirms currently smokes cigarettes °Assess: Unwilling to set a quit date, but is cutting back °Advise: needs to QUIT to reduce risk of cancer, cardio and cerebrovascular disease °Assist: counseled for 5 minutes and literature provided °Arrange: follow up in 2 to 4 months ° °
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
## 2021-03-04 NOTE — Assessment & Plan Note (Signed)
Worsening lBP, wi th lower ext weakness and numbness, has incontinence of BM repotted also

## 2021-03-04 NOTE — Assessment & Plan Note (Signed)
Pain between shoulder blades radiaiting to neck, needs X ray of thoracic spine

## 2021-03-07 ENCOUNTER — Other Ambulatory Visit: Payer: Self-pay | Admitting: Internal Medicine

## 2021-03-07 ENCOUNTER — Other Ambulatory Visit: Payer: Self-pay | Admitting: Family Medicine

## 2021-03-11 ENCOUNTER — Ambulatory Visit: Payer: Medicare HMO | Admitting: Family Medicine

## 2021-03-16 DIAGNOSIS — J432 Centrilobular emphysema: Secondary | ICD-10-CM | POA: Diagnosis not present

## 2021-03-16 DIAGNOSIS — J449 Chronic obstructive pulmonary disease, unspecified: Secondary | ICD-10-CM | POA: Diagnosis not present

## 2021-03-18 ENCOUNTER — Ambulatory Visit (HOSPITAL_COMMUNITY)
Admission: RE | Admit: 2021-03-18 | Discharge: 2021-03-18 | Disposition: A | Discharge status: Home / Self Care | Payer: Medicare HMO | Source: Ambulatory Visit | Attending: Family Medicine | Admitting: Family Medicine

## 2021-03-18 DIAGNOSIS — M549 Dorsalgia, unspecified: Secondary | ICD-10-CM | POA: Diagnosis not present

## 2021-03-18 DIAGNOSIS — M545 Low back pain, unspecified: Secondary | ICD-10-CM | POA: Diagnosis not present

## 2021-03-21 ENCOUNTER — Telehealth: Payer: Self-pay

## 2021-03-21 DIAGNOSIS — M549 Dorsalgia, unspecified: Secondary | ICD-10-CM

## 2021-03-21 DIAGNOSIS — H0288A Meibomian gland dysfunction right eye, upper and lower eyelids: Secondary | ICD-10-CM | POA: Diagnosis not present

## 2021-03-21 DIAGNOSIS — H04123 Dry eye syndrome of bilateral lacrimal glands: Secondary | ICD-10-CM | POA: Diagnosis not present

## 2021-03-21 DIAGNOSIS — H0288B Meibomian gland dysfunction left eye, upper and lower eyelids: Secondary | ICD-10-CM | POA: Diagnosis not present

## 2021-03-21 DIAGNOSIS — H1013 Acute atopic conjunctivitis, bilateral: Secondary | ICD-10-CM | POA: Diagnosis not present

## 2021-03-21 NOTE — Telephone Encounter (Signed)
Pt is returning call regarding MRI results

## 2021-03-22 NOTE — Telephone Encounter (Signed)
LVM for pt to call the office regarding MRI results

## 2021-03-27 NOTE — Telephone Encounter (Signed)
I spoke with pt and she does want to proceed with referral to [facility] - Dr. Vertell Limber. She also wanted to make sure she didn't need a neck and shoulder xray done prior to follow up appt on Monday.

## 2021-03-27 NOTE — Telephone Encounter (Signed)
Left vm- waiting on a call back to see which neurosurgeon she wants to be referred to. If no preference will refer to dr Vertell Limber

## 2021-03-28 DIAGNOSIS — G8929 Other chronic pain: Secondary | ICD-10-CM | POA: Diagnosis not present

## 2021-03-28 DIAGNOSIS — G629 Polyneuropathy, unspecified: Secondary | ICD-10-CM | POA: Diagnosis not present

## 2021-03-28 DIAGNOSIS — M25551 Pain in right hip: Secondary | ICD-10-CM | POA: Diagnosis not present

## 2021-03-28 DIAGNOSIS — Z79899 Other long term (current) drug therapy: Secondary | ICD-10-CM | POA: Diagnosis not present

## 2021-03-28 DIAGNOSIS — M25571 Pain in right ankle and joints of right foot: Secondary | ICD-10-CM | POA: Diagnosis not present

## 2021-03-29 NOTE — Addendum Note (Signed)
Addended by: Eual Fines on: 03/29/2021 10:45 AM   Modules accepted: Orders

## 2021-04-01 ENCOUNTER — Ambulatory Visit (INDEPENDENT_AMBULATORY_CARE_PROVIDER_SITE_OTHER): Payer: Medicare HMO | Admitting: Family Medicine

## 2021-04-01 ENCOUNTER — Other Ambulatory Visit: Payer: Self-pay

## 2021-04-01 ENCOUNTER — Encounter: Payer: Self-pay | Admitting: Family Medicine

## 2021-04-01 ENCOUNTER — Ambulatory Visit (HOSPITAL_COMMUNITY)
Admission: RE | Admit: 2021-04-01 | Discharge: 2021-04-01 | Disposition: A | Discharge status: Home / Self Care | Payer: Medicare HMO | Source: Ambulatory Visit | Attending: Family Medicine | Admitting: Family Medicine

## 2021-04-01 VITALS — BP 138/77 | HR 55 | Resp 16 | Ht 63.0 in | Wt 126.1 lb

## 2021-04-01 DIAGNOSIS — I7410 Embolism and thrombosis of unspecified parts of aorta: Secondary | ICD-10-CM | POA: Diagnosis not present

## 2021-04-01 DIAGNOSIS — Z1231 Encounter for screening mammogram for malignant neoplasm of breast: Secondary | ICD-10-CM | POA: Diagnosis not present

## 2021-04-01 DIAGNOSIS — F17200 Nicotine dependence, unspecified, uncomplicated: Secondary | ICD-10-CM | POA: Diagnosis not present

## 2021-04-01 DIAGNOSIS — F418 Other specified anxiety disorders: Secondary | ICD-10-CM

## 2021-04-01 DIAGNOSIS — M542 Cervicalgia: Secondary | ICD-10-CM

## 2021-04-01 DIAGNOSIS — I471 Supraventricular tachycardia: Secondary | ICD-10-CM

## 2021-04-01 DIAGNOSIS — K219 Gastro-esophageal reflux disease without esophagitis: Secondary | ICD-10-CM

## 2021-04-01 DIAGNOSIS — R0989 Other specified symptoms and signs involving the circulatory and respiratory systems: Secondary | ICD-10-CM

## 2021-04-01 DIAGNOSIS — D699 Hemorrhagic condition, unspecified: Secondary | ICD-10-CM | POA: Diagnosis not present

## 2021-04-01 DIAGNOSIS — M546 Pain in thoracic spine: Secondary | ICD-10-CM

## 2021-04-01 DIAGNOSIS — E785 Hyperlipidemia, unspecified: Secondary | ICD-10-CM

## 2021-04-01 DIAGNOSIS — J449 Chronic obstructive pulmonary disease, unspecified: Secondary | ICD-10-CM | POA: Diagnosis not present

## 2021-04-01 DIAGNOSIS — C3490 Malignant neoplasm of unspecified part of unspecified bronchus or lung: Secondary | ICD-10-CM | POA: Diagnosis not present

## 2021-04-01 MED ORDER — TRAZODONE HCL 50 MG PO TABS: 50.0000 mg | ORAL_TABLET | Freq: Two times a day (BID) | ORAL | 2 refills | Status: DC

## 2021-04-01 NOTE — Assessment & Plan Note (Signed)
Reports worsened pain which radiates to hands, needs x ray which was ordered in the past

## 2021-04-01 NOTE — Assessment & Plan Note (Signed)
Refer for thoracic spine

## 2021-04-01 NOTE — Patient Instructions (Addendum)
Annual exam in October , call if you need me before  Please get X ray of neck and upper back today    Please give appt with Neurosurgery to pt, still has not heard though authorized   Please schedule July mammogram  At [facility], pt will discuss prefence dates  Please work on [place] back on smoking  Thanks for choosing [facility], we consider it a privelige to serve you.

## 2021-04-01 NOTE — Telephone Encounter (Signed)
Pt seen in office for visit today

## 2021-04-01 NOTE — Assessment & Plan Note (Signed)
Controlled, no change in medication  

## 2021-04-01 NOTE — Assessment & Plan Note (Signed)
Asked:confirms currently smokes cigarettes Assess: Unwilling to set a quit date, and  is cutting back Advise: needs to QUIT to reduce risk of cancer, cardio and cerebrovascular disease Assist: counseled for 5 minutes and literature provided Arrange: follow up in 2 to 4 months

## 2021-04-01 NOTE — Assessment & Plan Note (Signed)
Hyperlipidemia:Low fat diet discussed and encouraged.   Lipid Panel  Lab Results  Component Value Date   CHOL 184 02/21/2021   HDL 71 02/21/2021   LDLCALC 86 02/21/2021   TRIG 175 (H) 02/21/2021   CHOLHDL 2.6 02/21/2021     needs to reduce fat in diet

## 2021-04-01 NOTE — Assessment & Plan Note (Signed)
Rate controlled on current medication, followed by Cardiology

## 2021-04-01 NOTE — Assessment & Plan Note (Addendum)
Uncontrolled however no interest in medications, states unable to tolerate, not suicidal or homicidal

## 2021-04-01 NOTE — Progress Notes (Signed)
   [name]     MRN: [id number]      DOB: Nov 14, 1950   HPI [name] is here stating trhe pain in her lower and upper back is sosevere rates at a 7 , unable to get comfortable, states pain meds are not helping much   ROS Denies recent fever or chills. Denies sinus pressure, nasal congestion, ear pain or sore throat. Denies chest congestion, productive cough or wheezing. Denies chest pains, palpitations and leg swelling Denies abdominal pain, nausea, vomiting,diarrhea or constipation.   Denies dysuria, frequency, hesitancy or incontinence. Denies joint pain, swelling and limitation in mobility. Denies headaches, seizures, numbness, or tingling. Denies depression, anxiety or insomnia. Denies skin break down or rash.   PE  BP 138/77   Pulse (!) 55   Resp 16   Ht 5\' 3"  (1.6 m)   Wt 126 lb 1.9 oz (57.2 kg)   SpO2 94%   BMI 22.34 kg/m   Patient alert and oriented and in no cardiopulmonary distress.  HEENT: No facial asymmetry, EOMI,     Neck supple .  Chest: Clear to auscultation bilaterally.Decreased air entry throughout. CVS: S1, S2 no murmurs, no S3.Regular rate.  ABD: Soft non tender.   Ext: No edema  MS: Adequate ROM spine, shoulders, hips and knees.  Skin: Intact, no ulcerations or rash noted.  Psych: Good eye contact, normal affect. Memory intact not anxious or depressed appearing.  CNS: CN 2-12 intact, power,  normal throughout.no focal deficits noted.   Assessment & Plan  Hyperlipemia Hyperlipidemia:Low fat diet discussed and encouraged.   Lipid Panel  Lab Results  Component Value Date   CHOL 184 02/21/2021   HDL 71 02/21/2021   LDLCALC 86 02/21/2021   TRIG 175 (H) 02/21/2021   CHOLHDL 2.6 02/21/2021     needs to reduce fat in diet  NICOTINE ADDICTION Asked:confirms currently smokes cigarettes Assess: Unwilling to set a quit date, and  is cutting back Advise: needs to QUIT to reduce risk of cancer, cardio and cerebrovascular  disease Assist: counseled for 5 minutes and literature provided Arrange: follow up in 2 to 4 months   Paroxysmal SVT (supraventricular tachycardia) Rate controlled on current medication, followed by Cardiology  NECK PAIN, CHRONIC Reports worsened pain which radiates to hands, needs x ray which was ordered in the past  Thoracic spine pain Refer for thoracic spine  Depression with anxiety Uncontrolled however no interest in medications, states unable to tolerate, not suicidal or homicidal  GERD Controlled, no change in medication

## 2021-04-02 NOTE — Addendum Note (Signed)
Addended by: [name] E on: 04/02/2021 11:53 AM   Modules accepted: Orders

## 2021-04-08 ENCOUNTER — Encounter: Payer: Self-pay | Admitting: Family Medicine

## 2021-04-10 ENCOUNTER — Ambulatory Visit (HOSPITAL_COMMUNITY)
Admission: RE | Admit: 2021-04-10 | Discharge: 2021-04-10 | Disposition: A | Discharge status: Home / Self Care | Payer: Medicare HMO | Source: Ambulatory Visit | Attending: Family Medicine | Admitting: Family Medicine

## 2021-04-10 DIAGNOSIS — I6523 Occlusion and stenosis of bilateral carotid arteries: Secondary | ICD-10-CM | POA: Diagnosis not present

## 2021-04-10 DIAGNOSIS — R0989 Other specified symptoms and signs involving the circulatory and respiratory systems: Secondary | ICD-10-CM | POA: Insufficient documentation

## 2021-04-10 DIAGNOSIS — E782 Mixed hyperlipidemia: Secondary | ICD-10-CM | POA: Diagnosis not present

## 2021-04-10 DIAGNOSIS — I10 Essential (primary) hypertension: Secondary | ICD-10-CM | POA: Diagnosis not present

## 2021-04-12 ENCOUNTER — Other Ambulatory Visit: Payer: Self-pay | Admitting: Family Medicine

## 2021-04-12 ENCOUNTER — Encounter: Payer: Self-pay | Admitting: Family Medicine

## 2021-04-12 ENCOUNTER — Telehealth: Payer: Self-pay | Admitting: Family Medicine

## 2021-04-12 DIAGNOSIS — I6523 Occlusion and stenosis of bilateral carotid arteries: Secondary | ICD-10-CM

## 2021-04-12 NOTE — Telephone Encounter (Signed)
Thnks I let pt know this by pt msg

## 2021-04-12 NOTE — Telephone Encounter (Signed)
Pt has been referred to Dr Vertell Limber and appt is authorized, IF  appt info still not verified, pls verify and give this to her, also I just referred her to vascular surgery, thanks!

## 2021-04-16 DIAGNOSIS — J432 Centrilobular emphysema: Secondary | ICD-10-CM | POA: Diagnosis not present

## 2021-04-16 DIAGNOSIS — J449 Chronic obstructive pulmonary disease, unspecified: Secondary | ICD-10-CM | POA: Diagnosis not present

## 2021-04-17 DIAGNOSIS — I480 Paroxysmal atrial fibrillation: Secondary | ICD-10-CM | POA: Diagnosis not present

## 2021-04-17 DIAGNOSIS — E782 Mixed hyperlipidemia: Secondary | ICD-10-CM | POA: Diagnosis not present

## 2021-04-17 DIAGNOSIS — I10 Essential (primary) hypertension: Secondary | ICD-10-CM | POA: Diagnosis not present

## 2021-04-17 DIAGNOSIS — Z5181 Encounter for therapeutic drug level monitoring: Secondary | ICD-10-CM | POA: Diagnosis not present

## 2021-04-17 DIAGNOSIS — Z79899 Other long term (current) drug therapy: Secondary | ICD-10-CM | POA: Diagnosis not present

## 2021-04-17 DIAGNOSIS — I2510 Atherosclerotic heart disease of native coronary artery without angina pectoris: Secondary | ICD-10-CM | POA: Diagnosis not present

## 2021-04-17 DIAGNOSIS — I700 Atherosclerosis of aorta: Secondary | ICD-10-CM | POA: Diagnosis not present

## 2021-04-17 DIAGNOSIS — I6523 Occlusion and stenosis of bilateral carotid arteries: Secondary | ICD-10-CM | POA: Diagnosis not present

## 2021-04-17 DIAGNOSIS — I70219 Atherosclerosis of native arteries of extremities with intermittent claudication, unspecified extremity: Secondary | ICD-10-CM | POA: Diagnosis not present

## 2021-04-23 DIAGNOSIS — M25571 Pain in right ankle and joints of right foot: Secondary | ICD-10-CM | POA: Diagnosis not present

## 2021-04-23 DIAGNOSIS — Z79899 Other long term (current) drug therapy: Secondary | ICD-10-CM | POA: Diagnosis not present

## 2021-04-23 DIAGNOSIS — R300 Dysuria: Secondary | ICD-10-CM | POA: Diagnosis not present

## 2021-04-23 DIAGNOSIS — G629 Polyneuropathy, unspecified: Secondary | ICD-10-CM | POA: Diagnosis not present

## 2021-04-23 DIAGNOSIS — G8929 Other chronic pain: Secondary | ICD-10-CM | POA: Diagnosis not present

## 2021-04-26 NOTE — Telephone Encounter (Signed)
Called and got patient appointment switched to 05/29/21 @ 9:50 am patient aware

## 2021-05-06 ENCOUNTER — Ambulatory Visit (INDEPENDENT_AMBULATORY_CARE_PROVIDER_SITE_OTHER): Payer: Medicare HMO

## 2021-05-06 ENCOUNTER — Other Ambulatory Visit: Payer: Self-pay

## 2021-05-06 DIAGNOSIS — Z0000 Encounter for general adult medical examination without abnormal findings: Secondary | ICD-10-CM
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
## 2021-05-06 NOTE — Patient Instructions (Signed)
[name] , Thank you for taking time to come for your Medicare Wellness Visit. I appreciate your ongoing commitment to your health goals. Please review the following plan we discussed and let me know if I can assist you in the future.   Screening recommendations/referrals: Colonoscopy: Up to date, next due 04/27/2028 Mammogram: Up to date, next due 05/18/2021 Bone Density: Up to date, next due 02/05/2024 Recommended yearly ophthalmology/optometry visit for glaucoma screening and checkup Recommended yearly dental visit for hygiene and checkup  Vaccinations: Influenza vaccine: Up to date, next due fall 2022  Pneumococcal vaccine: Completed series  Tdap vaccine: Currently due, you may await and injury to receive  Shingles vaccine: Currently due for Shingrix, if you would like to receive we recommend that you do so at your local pharmacy.     Advanced directives: Advance directive discussed with you today. Even though you declined this today please call our office should you change your mind and we can give you the proper paperwork for you to fill out.   Conditions/risks identified: None   Next appointment: None   Preventive Care 65 Years and Older, Female Preventive care refers to lifestyle choices and visits with your health care provider that can promote health and wellness. What does preventive care include? A yearly physical exam. This is also called an annual well check. Dental exams once or twice a year. Routine eye exams. Ask your health care provider how often you should have your eyes checked. Personal lifestyle choices, including: Daily care of your teeth and gums. Regular physical activity. Eating a healthy diet. Avoiding tobacco and drug use. Limiting alcohol use. Practicing safe sex. Taking low-dose aspirin every day. Taking vitamin and mineral supplements as recommended by your health care provider. What happens during an annual well check? The services and  screenings done by your health care provider during your annual well check will depend on your age, overall health, lifestyle risk factors, and family history of disease. Counseling  Your health care provider may ask you questions about your: Alcohol use. Tobacco use. Drug use. Emotional well-being. Home and relationship well-being. Sexual activity. Eating habits. History of falls. Memory and ability to understand (cognition). Work and work Statistician. Reproductive health. Screening  You may have the following tests or measurements: Height, weight, and BMI. Blood pressure. Lipid and cholesterol levels. These may be checked every 5 years, or more frequently if you are over 11 years old. Skin check. Lung cancer screening. You may have this screening every year starting at age 105 if you have a 30-pack-year history of smoking and currently smoke or have quit within the past 15 years. Fecal occult blood test (FOBT) of the stool. You may have this test every year starting at age 4. Flexible sigmoidoscopy or colonoscopy. You may have a sigmoidoscopy every 5 years or a colonoscopy every 10 years starting at age 20. Hepatitis C blood test. Hepatitis B blood test. Sexually transmitted disease (STD) testing. Diabetes screening. This is done by checking your blood sugar (glucose) after you have not eaten for a while (fasting). You may have this done every 1-3 years. Bone density scan. This is done to screen for osteoporosis. You may have this done starting at age 76. Mammogram. This may be done every 1-2 years. Talk to your health care provider about how often you should have regular mammograms. Talk with your health care provider about your test results, treatment options, and if necessary, the need for more tests. Vaccines  Your  health care provider may recommend certain vaccines, such as: Influenza vaccine. This is recommended every year. Tetanus, diphtheria, and acellular pertussis (Tdap,  Td) vaccine. You may need a Td booster every 10 years. Zoster vaccine. You may need this after age 37. Pneumococcal 13-valent conjugate (PCV13) vaccine. One dose is recommended after age 66. Pneumococcal polysaccharide (PPSV23) vaccine. One dose is recommended after age 29. Talk to your health care provider about which screenings and vaccines you need and how often you need them. This information is not intended to replace advice given to you by your health care provider. Make sure you discuss any questions you have with your health care provider. Document Released: 11/23/2015 Document Revised: 07/16/2016 Document Reviewed: 08/28/2015 Elsevier Interactive Patient Education  2017 [employer] Prevention in the Home Falls can cause injuries. They can happen to people of all ages. There are many things you can do to make your home safe and to help prevent falls. What can I do on the outside of my home? Regularly fix the edges of walkways and driveways and fix any cracks. Remove anything that might make you trip as you walk through a door, such as a raised step or threshold. Trim any bushes or trees on the path to your home. Use bright outdoor lighting. Clear any walking paths of anything that might make someone trip, such as rocks or tools. Regularly check to see if handrails are loose or broken. Make sure that both sides of any steps have handrails. Any raised decks and porches should have guardrails on the edges. Have any leaves, snow, or ice cleared regularly. Use sand or salt on walking paths during winter. Clean up any spills in your garage right away. This includes oil or grease spills. What can I do in the bathroom? Use night lights. Install grab bars by the toilet and in the tub and shower. Do not use towel bars as grab bars. Use non-skid mats or decals in the tub or shower. If you need to sit down in the shower, use a plastic, non-slip stool. Keep the floor dry. Clean up any  water that spills on the floor as soon as it happens. Remove soap buildup in the tub or shower regularly. Attach bath mats securely with double-sided non-slip rug tape. Do not have throw rugs and other things on the floor that can make you trip. What can I do in the bedroom? Use night lights. Make sure that you have a light by your bed that is easy to reach. Do not use any sheets or blankets that are too big for your bed. They should not hang down onto the floor. Have a firm chair that has side arms. You can use this for support while you get dressed. Do not have throw rugs and other things on the floor that can make you trip. What can I do in the kitchen? Clean up any spills right away. Avoid walking on wet floors. Keep items that you use a lot in easy-to-reach places. If you need to reach something above you, use a strong step stool that has a grab bar. Keep electrical cords out of the way. Do not use floor polish or wax that makes floors slippery. If you must use wax, use non-skid floor wax. Do not have throw rugs and other things on the floor that can make you trip. What can I do with my stairs? Do not leave any items on the stairs. Make sure that there are handrails  on both sides of the stairs and use them. Fix handrails that are broken or loose. Make sure that handrails are as long as the stairways. Check any carpeting to make sure that it is firmly attached to the stairs. Fix any carpet that is loose or worn. Avoid having throw rugs at the top or bottom of the stairs. If you do have throw rugs, attach them to the floor with carpet tape. Make sure that you have a light switch at the top of the stairs and the bottom of the stairs. If you do not have them, ask someone to add them for you. What else can I do to help prevent falls? Wear shoes that: Do not have high heels. Have rubber bottoms. Are comfortable and fit you well. Are closed at the toe. Do not wear sandals. If you use a  stepladder: Make sure that it is fully opened. Do not climb a closed stepladder. Make sure that both sides of the stepladder are locked into place. Ask someone to hold it for you, if possible. Clearly mark and make sure that you can see: Any grab bars or handrails. First and last steps. Where the edge of each step is. Use tools that help you move around (mobility aids) if they are needed. These include: Canes. Walkers. Scooters. Crutches. Turn on the lights when you go into a dark area. Replace any light bulbs as soon as they burn out. Set up your furniture so you have a clear path. Avoid moving your furniture around. If any of your floors are uneven, fix them. If there are any pets around you, be aware of where they are. Review your medicines with your doctor. Some medicines can make you feel dizzy. This can increase your chance of falling. Ask your doctor what other things that you can do to help prevent falls. This information is not intended to replace advice given to you by your health care provider. Make sure you discuss any questions you have with your health care provider. Document Released: 08/23/2009 Document Revised: 04/03/2016 Document Reviewed: 12/01/2014 Elsevier Interactive Patient Education  2017 [employer].

## 2021-05-16 DIAGNOSIS — J449 Chronic obstructive pulmonary disease, unspecified: Secondary | ICD-10-CM | POA: Diagnosis not present

## 2021-05-16 DIAGNOSIS — J432 Centrilobular emphysema: Secondary | ICD-10-CM | POA: Diagnosis not present

## 2021-05-22 ENCOUNTER — Encounter: Payer: Medicare HMO | Admitting: Vascular Surgery

## 2021-05-23 DIAGNOSIS — G8929 Other chronic pain: Secondary | ICD-10-CM | POA: Diagnosis not present

## 2021-05-23 DIAGNOSIS — Z79899 Other long term (current) drug therapy: Secondary | ICD-10-CM | POA: Diagnosis not present

## 2021-05-23 DIAGNOSIS — M25571 Pain in right ankle and joints of right foot: Secondary | ICD-10-CM | POA: Diagnosis not present

## 2021-05-23 DIAGNOSIS — G629 Polyneuropathy, unspecified: Secondary | ICD-10-CM | POA: Diagnosis not present

## 2021-05-27 ENCOUNTER — Other Ambulatory Visit: Payer: Self-pay | Admitting: Family Medicine

## 2021-05-29 ENCOUNTER — Other Ambulatory Visit: Payer: Self-pay

## 2021-05-29 ENCOUNTER — Ambulatory Visit (INDEPENDENT_AMBULATORY_CARE_PROVIDER_SITE_OTHER): Payer: Medicare HMO | Admitting: Vascular Surgery

## 2021-05-29 ENCOUNTER — Encounter: Payer: Self-pay | Admitting: Vascular Surgery

## 2021-05-29 VITALS — BP 148/75 | HR 66 | Temp 97.7°F | Ht 63.0 in | Wt 123.6 lb

## 2021-05-29 DIAGNOSIS — I6522 Occlusion and stenosis of left carotid artery: Secondary | ICD-10-CM
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
## 2021-06-11 ENCOUNTER — Ambulatory Visit (HOSPITAL_COMMUNITY)
Admission: RE | Admit: 2021-06-11 | Discharge: 2021-06-11 | Disposition: A | Discharge status: Home / Self Care | Payer: Medicare HMO | Source: Ambulatory Visit | Attending: Internal Medicine | Admitting: Internal Medicine

## 2021-06-11 ENCOUNTER — Other Ambulatory Visit: Payer: Self-pay | Admitting: Medical Oncology

## 2021-06-11 ENCOUNTER — Telehealth: Payer: Self-pay | Admitting: Medical Oncology

## 2021-06-11 ENCOUNTER — Other Ambulatory Visit (HOSPITAL_COMMUNITY)
Admission: RE | Admit: 2021-06-11 | Discharge: 2021-06-11 | Disposition: A | Discharge status: Home / Self Care | Payer: Medicare HMO | Source: Ambulatory Visit | Attending: Internal Medicine | Admitting: Internal Medicine

## 2021-06-11 ENCOUNTER — Other Ambulatory Visit: Payer: Self-pay

## 2021-06-11 DIAGNOSIS — J439 Emphysema, unspecified: Secondary | ICD-10-CM | POA: Diagnosis not present

## 2021-06-11 DIAGNOSIS — I700 Atherosclerosis of aorta: Secondary | ICD-10-CM | POA: Diagnosis not present

## 2021-06-11 DIAGNOSIS — C3490 Malignant neoplasm of unspecified part of unspecified bronchus or lung: Secondary | ICD-10-CM

## 2021-06-11 LAB — CBC WITH DIFFERENTIAL/PLATELET
Abs Immature Granulocytes: 0.05 10*3/uL (ref 0.00–0.07)
Basophils Absolute: 0 10*3/uL (ref 0.0–0.1)
Basophils Relative: 0 %
Eosinophils Absolute: 0.2 10*3/uL (ref 0.0–0.5)
Eosinophils Relative: 2 %
HCT: 42.3 % (ref 36.0–46.0)
Hemoglobin: 13.9 g/dL (ref 12.0–15.0)
Immature Granulocytes: 1 %
Lymphocytes Relative: 19 %
Lymphs Abs: 1.8 10*3/uL (ref 0.7–4.0)
MCH: 32.3 pg (ref 26.0–34.0)
MCHC: 32.9 g/dL (ref 30.0–36.0)
MCV: 98.1 fL (ref 80.0–100.0)
Monocytes Absolute: 0.9 10*3/uL (ref 0.1–1.0)
Monocytes Relative: 9 %
Neutro Abs: 6.6 10*3/uL (ref 1.7–7.7)
Neutrophils Relative %: 69 %
Platelets: 216 10*3/uL (ref 150–400)
RBC: 4.31 MIL/uL (ref 3.87–5.11)
RDW: 13.6 % (ref 11.5–15.5)
WBC: 9.6 10*3/uL (ref 4.0–10.5)
nRBC: 0 % (ref 0.0–0.2)

## 2021-06-11 LAB — COMPREHENSIVE METABOLIC PANEL
ALT: 15 U/L (ref 0–44)
AST: 19 U/L (ref 15–41)
Albumin: 4.2 g/dL (ref 3.5–5.0)
Alkaline Phosphatase: 52 U/L (ref 38–126)
Anion gap: 5 (ref 5–15)
BUN: 16 mg/dL (ref 8–23)
CO2: 28 mmol/L (ref 22–32)
Calcium: 8.9 mg/dL (ref 8.9–10.3)
Chloride: 101 mmol/L (ref 98–111)
Creatinine, Ser: 0.62 mg/dL (ref 0.44–1.00)
GFR, Estimated: >60 mL/min (ref >60)
Glucose, Bld: 94 mg/dL (ref 70–99)
Potassium: 4.3 mmol/L (ref 3.5–5.1)
Sodium: 134 mmol/L — ABNORMAL LOW (ref 135–145)
Total Bilirubin: 0.5 mg/dL (ref 0.3–1.2)
Total Protein: 6.8 g/dL (ref 6.5–8.1)

## 2021-06-11 LAB — POCT I-STAT CREATININE: Creatinine, Ser: 0.7 mg/dL (ref 0.44–1.00)

## 2021-06-11 MED ORDER — IOHEXOL 300 MG/ML  SOLN
75.0000 mL | Freq: Once | INTRAMUSCULAR | Status: AC | PRN
  Administered 2021-06-11: 75 mL via INTRAVENOUS

## 2021-06-11 NOTE — Telephone Encounter (Signed)
Labs done at [facility].

## 2021-06-12 ENCOUNTER — Telehealth: Payer: Self-pay | Admitting: Internal Medicine

## 2021-06-12 ENCOUNTER — Inpatient Hospital Stay: Payer: Medicare HMO | Attending: Internal Medicine | Admitting: Internal Medicine

## 2021-06-12 VITALS — BP 152/58 | HR 47 | Temp 97.9°F | Resp 19 | Ht 63.0 in | Wt 123.6 lb

## 2021-06-12 DIAGNOSIS — C3491 Malignant neoplasm of unspecified part of right bronchus or lung: Secondary | ICD-10-CM

## 2021-06-12 DIAGNOSIS — Z79899 Other long term (current) drug therapy: Secondary | ICD-10-CM | POA: Insufficient documentation

## 2021-06-12 DIAGNOSIS — Z85118 Personal history of other malignant neoplasm of bronchus and lung: Secondary | ICD-10-CM | POA: Insufficient documentation

## 2021-06-12 DIAGNOSIS — I700 Atherosclerosis of aorta: Secondary | ICD-10-CM | POA: Insufficient documentation

## 2021-06-12 DIAGNOSIS — R531 Weakness: Secondary | ICD-10-CM | POA: Insufficient documentation

## 2021-06-12 DIAGNOSIS — C3490 Malignant neoplasm of unspecified part of unspecified bronchus or lung: Secondary | ICD-10-CM
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
## 2021-06-12 NOTE — Telephone Encounter (Signed)
Scheduled per los. Declined printout  

## 2021-06-16 DIAGNOSIS — J449 Chronic obstructive pulmonary disease, unspecified: Secondary | ICD-10-CM | POA: Diagnosis not present

## 2021-06-16 DIAGNOSIS — J432 Centrilobular emphysema: Secondary | ICD-10-CM | POA: Diagnosis not present

## 2021-06-20 DIAGNOSIS — Z79899 Other long term (current) drug therapy: Secondary | ICD-10-CM | POA: Diagnosis not present

## 2021-06-20 DIAGNOSIS — G629 Polyneuropathy, unspecified: Secondary | ICD-10-CM | POA: Diagnosis not present

## 2021-06-20 DIAGNOSIS — M25571 Pain in right ankle and joints of right foot: Secondary | ICD-10-CM | POA: Diagnosis not present

## 2021-06-20 DIAGNOSIS — G8929 Other chronic pain: Secondary | ICD-10-CM | POA: Diagnosis not present

## 2021-06-21 DIAGNOSIS — M5416 Radiculopathy, lumbar region: Secondary | ICD-10-CM | POA: Diagnosis not present

## 2021-06-21 DIAGNOSIS — G8929 Other chronic pain: Secondary | ICD-10-CM | POA: Diagnosis not present

## 2021-06-21 DIAGNOSIS — M5136 Other intervertebral disc degeneration, lumbar region: Secondary | ICD-10-CM | POA: Diagnosis not present

## 2021-06-21 DIAGNOSIS — M5441 Lumbago with sciatica, right side: Secondary | ICD-10-CM | POA: Diagnosis not present

## 2021-06-21 DIAGNOSIS — M4802 Spinal stenosis, cervical region: Secondary | ICD-10-CM | POA: Diagnosis not present

## 2021-06-28 ENCOUNTER — Other Ambulatory Visit (HOSPITAL_COMMUNITY): Payer: Self-pay | Admitting: Neurosurgery

## 2021-06-28 ENCOUNTER — Other Ambulatory Visit: Payer: Self-pay | Admitting: Neurosurgery

## 2021-06-28 DIAGNOSIS — G8929 Other chronic pain: Secondary | ICD-10-CM

## 2021-06-28 DIAGNOSIS — M4802 Spinal stenosis, cervical region: Secondary | ICD-10-CM

## 2021-06-28 DIAGNOSIS — M5441 Lumbago with sciatica, right side: Secondary | ICD-10-CM

## 2021-07-09 ENCOUNTER — Ambulatory Visit (HOSPITAL_COMMUNITY): Payer: Medicare HMO

## 2021-07-09 ENCOUNTER — Encounter (HOSPITAL_COMMUNITY): Payer: Self-pay

## 2021-07-17 ENCOUNTER — Ambulatory Visit (HOSPITAL_COMMUNITY)
Admission: RE | Admit: 2021-07-17 | Discharge: 2021-07-17 | Disposition: A | Discharge status: Home / Self Care | Payer: Medicare HMO | Source: Ambulatory Visit | Attending: Neurosurgery | Admitting: Neurosurgery

## 2021-07-17 ENCOUNTER — Other Ambulatory Visit: Payer: Self-pay

## 2021-07-17 DIAGNOSIS — M5441 Lumbago with sciatica, right side: Secondary | ICD-10-CM | POA: Insufficient documentation

## 2021-07-17 DIAGNOSIS — G8929 Other chronic pain: Secondary | ICD-10-CM | POA: Insufficient documentation

## 2021-07-17 DIAGNOSIS — J449 Chronic obstructive pulmonary disease, unspecified: Secondary | ICD-10-CM | POA: Diagnosis not present

## 2021-07-17 DIAGNOSIS — M4802 Spinal stenosis, cervical region: Secondary | ICD-10-CM | POA: Insufficient documentation

## 2021-07-17 DIAGNOSIS — M542 Cervicalgia: Secondary | ICD-10-CM | POA: Diagnosis not present

## 2021-07-17 DIAGNOSIS — J432 Centrilobular emphysema: Secondary | ICD-10-CM | POA: Diagnosis not present

## 2021-07-18 ENCOUNTER — Ambulatory Visit (HOSPITAL_COMMUNITY): Payer: Medicare HMO | Attending: Neurosurgery | Admitting: Physical Therapy

## 2021-07-18 DIAGNOSIS — M542 Cervicalgia: Secondary | ICD-10-CM | POA: Insufficient documentation

## 2021-07-18 DIAGNOSIS — M5416 Radiculopathy, lumbar region: Secondary | ICD-10-CM | POA: Diagnosis not present
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
## 2021-07-23 ENCOUNTER — Encounter (HOSPITAL_COMMUNITY): Payer: Medicare HMO | Admitting: Physical Therapy

## 2021-07-23 DIAGNOSIS — M25571 Pain in right ankle and joints of right foot: Secondary | ICD-10-CM | POA: Diagnosis not present

## 2021-07-23 DIAGNOSIS — Z79899 Other long term (current) drug therapy: Secondary | ICD-10-CM | POA: Diagnosis not present

## 2021-07-23 DIAGNOSIS — R300 Dysuria: Secondary | ICD-10-CM | POA: Diagnosis not present

## 2021-07-23 DIAGNOSIS — G8929 Other chronic pain: Secondary | ICD-10-CM | POA: Diagnosis not present

## 2021-07-23 DIAGNOSIS — G629 Polyneuropathy, unspecified: Secondary | ICD-10-CM | POA: Diagnosis not present

## 2021-07-23 DIAGNOSIS — Z0000 Encounter for general adult medical examination without abnormal findings: Secondary | ICD-10-CM | POA: Diagnosis not present

## 2021-07-23 DIAGNOSIS — Z6822 Body mass index (BMI) 22.0-22.9, adult: Secondary | ICD-10-CM | POA: Diagnosis not present

## 2021-07-26 ENCOUNTER — Encounter (HOSPITAL_COMMUNITY): Payer: Medicare HMO | Admitting: Physical Therapy

## 2021-07-29 DIAGNOSIS — Z79899 Other long term (current) drug therapy: Secondary | ICD-10-CM | POA: Diagnosis not present

## 2021-07-30 ENCOUNTER — Encounter (HOSPITAL_COMMUNITY): Payer: Medicare HMO | Admitting: Physical Therapy

## 2021-08-01 ENCOUNTER — Encounter (HOSPITAL_COMMUNITY): Payer: Medicare HMO

## 2021-08-05 ENCOUNTER — Encounter (HOSPITAL_COMMUNITY): Payer: Medicare HMO | Admitting: Physical Therapy

## 2021-08-06 ENCOUNTER — Encounter (HOSPITAL_COMMUNITY): Payer: Medicare HMO | Admitting: Physical Therapy

## 2021-08-08 ENCOUNTER — Encounter (HOSPITAL_COMMUNITY): Payer: Medicare HMO | Admitting: Physical Therapy

## 2021-08-12 ENCOUNTER — Encounter (HOSPITAL_COMMUNITY): Payer: Medicare HMO | Admitting: Physical Therapy

## 2021-08-13 ENCOUNTER — Encounter (HOSPITAL_COMMUNITY): Payer: Medicare HMO | Admitting: Physical Therapy

## 2021-08-15 ENCOUNTER — Encounter (HOSPITAL_COMMUNITY): Payer: Medicare HMO

## 2021-08-16 DIAGNOSIS — J449 Chronic obstructive pulmonary disease, unspecified: Secondary | ICD-10-CM | POA: Diagnosis not present

## 2021-08-16 DIAGNOSIS — J432 Centrilobular emphysema: Secondary | ICD-10-CM | POA: Diagnosis not present

## 2021-08-19 ENCOUNTER — Encounter (HOSPITAL_COMMUNITY): Payer: Medicare HMO | Admitting: Physical Therapy

## 2021-08-20 ENCOUNTER — Encounter (HOSPITAL_COMMUNITY): Payer: Medicare HMO

## 2021-08-22 ENCOUNTER — Encounter (HOSPITAL_COMMUNITY): Payer: Medicare HMO

## 2021-08-22 DIAGNOSIS — M25571 Pain in right ankle and joints of right foot: Secondary | ICD-10-CM | POA: Diagnosis not present

## 2021-08-22 DIAGNOSIS — G629 Polyneuropathy, unspecified: Secondary | ICD-10-CM | POA: Diagnosis not present

## 2021-08-22 DIAGNOSIS — Z6822 Body mass index (BMI) 22.0-22.9, adult: Secondary | ICD-10-CM | POA: Diagnosis not present

## 2021-08-22 DIAGNOSIS — Z79899 Other long term (current) drug therapy: Secondary | ICD-10-CM | POA: Diagnosis not present

## 2021-08-22 DIAGNOSIS — E7800 Pure hypercholesterolemia, unspecified: Secondary | ICD-10-CM | POA: Diagnosis not present

## 2021-08-22 DIAGNOSIS — Z113 Encounter for screening for infections with a predominantly sexual mode of transmission: Secondary | ICD-10-CM | POA: Diagnosis not present

## 2021-08-22 DIAGNOSIS — M25561 Pain in right knee: Secondary | ICD-10-CM | POA: Diagnosis not present

## 2021-08-22 DIAGNOSIS — G8929 Other chronic pain: Secondary | ICD-10-CM | POA: Diagnosis not present

## 2021-08-22 DIAGNOSIS — E559 Vitamin D deficiency, unspecified: Secondary | ICD-10-CM | POA: Diagnosis not present

## 2021-08-22 DIAGNOSIS — Z1159 Encounter for screening for other viral diseases: Secondary | ICD-10-CM | POA: Diagnosis not present

## 2021-08-26 ENCOUNTER — Encounter: Payer: Medicare HMO | Admitting: Family Medicine

## 2021-08-27 ENCOUNTER — Encounter (HOSPITAL_COMMUNITY): Payer: Medicare HMO | Admitting: Physical Therapy

## 2021-08-27 DIAGNOSIS — H0288B Meibomian gland dysfunction left eye, upper and lower eyelids: Secondary | ICD-10-CM | POA: Diagnosis not present

## 2021-08-27 DIAGNOSIS — H04123 Dry eye syndrome of bilateral lacrimal glands: Secondary | ICD-10-CM | POA: Diagnosis not present

## 2021-08-27 DIAGNOSIS — H1013 Acute atopic conjunctivitis, bilateral: Secondary | ICD-10-CM | POA: Diagnosis not present

## 2021-08-27 DIAGNOSIS — H539 Unspecified visual disturbance: Secondary | ICD-10-CM | POA: Diagnosis not present

## 2021-08-27 DIAGNOSIS — H0288A Meibomian gland dysfunction right eye, upper and lower eyelids: Secondary | ICD-10-CM | POA: Diagnosis not present

## 2021-08-27 DIAGNOSIS — Z961 Presence of intraocular lens: Secondary | ICD-10-CM | POA: Diagnosis not present

## 2021-08-29 ENCOUNTER — Encounter (HOSPITAL_COMMUNITY): Payer: Medicare HMO | Admitting: Physical Therapy

## 2021-09-10 ENCOUNTER — Other Ambulatory Visit: Payer: Self-pay

## 2021-09-10 ENCOUNTER — Encounter: Payer: Medicare HMO | Admitting: Family Medicine

## 2021-09-10 ENCOUNTER — Ambulatory Visit (INDEPENDENT_AMBULATORY_CARE_PROVIDER_SITE_OTHER): Payer: Medicare HMO | Admitting: Family Medicine

## 2021-09-10 ENCOUNTER — Encounter: Payer: Self-pay | Admitting: Family Medicine

## 2021-09-10 VITALS — BP 157/75 | HR 99 | Resp 17 | Ht 63.0 in | Wt 127.0 lb

## 2021-09-10 DIAGNOSIS — I10 Essential (primary) hypertension: Secondary | ICD-10-CM

## 2021-09-10 DIAGNOSIS — E785 Hyperlipidemia, unspecified: Secondary | ICD-10-CM | POA: Diagnosis not present

## 2021-09-10 DIAGNOSIS — Z1231 Encounter for screening mammogram for malignant neoplasm of breast: Secondary | ICD-10-CM

## 2021-09-10 DIAGNOSIS — L989 Disorder of the skin and subcutaneous tissue, unspecified: Secondary | ICD-10-CM | POA: Insufficient documentation

## 2021-09-10 DIAGNOSIS — E559 Vitamin D deficiency, unspecified: Secondary | ICD-10-CM

## 2021-09-10 DIAGNOSIS — M25551 Pain in right hip: Secondary | ICD-10-CM

## 2021-09-10 DIAGNOSIS — Z0000 Encounter for general adult medical examination without abnormal findings: Secondary | ICD-10-CM

## 2021-09-10 DIAGNOSIS — F17200 Nicotine dependence, unspecified, uncomplicated: Secondary | ICD-10-CM

## 2021-09-10 DIAGNOSIS — G8929 Other chronic pain: Secondary | ICD-10-CM | POA: Insufficient documentation

## 2021-09-10 NOTE — Progress Notes (Signed)
    KARINNE SCHMADER     MRN: [id number]      DOB: 1951-07-23  HPI: Patient is in for annual physical exam. Ongoing right hip pain with limited mobility wants new Ortho eval C/o itchy ion above right upper lip Smoking 1 pPD trying to cut back BP elevated but wants Cardiology to address this.   PE: BP (!) 157/75   Pulse 99   Resp 17   Ht 5\' 3"  (1.6 m)   Wt 127 lb (57.6 kg)   SpO2 93%   BMI 22.50 kg/m  Pleasant  female, alert and oriented x 3, in no cardio-pulmonary distress. Afebrile. HEENT No facial trauma or asymetry. Sinuses non tender.  Extra occullar muscles intact.. External ears normal, . Neck: supple, no adenopathy,JVD or thyromegaly.No bruits.  Chest: Clear to ascultation bilaterally.No crackles or wheezes.no air entry in RLL, Non tender to palpation    Cardiovascular system; Heart sounds normal,  S1 and  S2 ,no S3.  No murmur, or thrill. Apical beat not displaced Peripheral pulses normal.  Abdomen: Soft, non tender,   .   Musculoskeletal exam: Decreased  ROM of spine, hips ,  and knees. deformity ,snoted. No muscle wasting or atrophy.   Neurologic: Cranial nerves 2 to 12 intact. Power,  normal throughout. disturbance in gait. No tremor.  Skin: Intact, no ulceration, erythema , scaling or rash noted. Rash /over right upper lip Pigmentation normal throughout  Psych; Normal mood and affect. Judgement and concentration normal   Assessment & Plan:  Chronic hip pain, right Worsening right hip pain, daily rated at 10 with ambulation/ weight bearing, started 6 months ago now unbearable, seen at [facility] , no progreess per pt refer to ortho in [place]  Skin lesion of face 6 month history , above right upper lip, irritating wants eval  Annual physical exam Annual exam as documented. Counseling done  re healthy lifestyle involving commitment to 150 minutes exercise per week, heart healthy diet, and attaining healthy weight.The importance of adequate  sleep also discussed. Regular seat belt use and home safety, is also discussed. Changes in health habits are decided on by the patient with goals and time frames  set for achieving them. Immunization and cancer screening needs are specifically addressed at this visit.   NICOTINE ADDICTION Asked:confirms currently smokes cigarettes 1 PPD Assess: Unwilling to set a quit date, but is cutting back Advise: needs to QUIT to reduce risk of cancer, cardio and cerebrovascular disease Assist: counseled for 5 minutes and literature provided Arrange: follow up in 2 to 4 months   Essential hypertension DASH diet and commitment to daily physical activity for a minimum of 30 minutes discussed and encouraged, as a part of hypertension management. The importance of attaining a healthy weight is also discussed.  BP/Weight 09/10/2021 06/12/2021 05/29/2021 05/06/2021 04/01/2021 03/04/2021 03/26/16  Systolic BP 494 496 759 - 163 - 846  Diastolic BP 75 58 75 - 77 - 74  Wt. (Lbs) 127 123.6 123.6 - 126.12 124 -  BMI 22.5 21.89 21.89 - 22.34 21.97 -     Pt electing to have Cardiology address this although I recommend med adjustment today

## 2021-09-10 NOTE — Assessment & Plan Note (Signed)
DASH diet and commitment to daily physical activity for a minimum of 30 minutes discussed and encouraged, as a part of hypertension management. The importance of attaining a healthy weight is also discussed.  BP/Weight 09/10/2021 06/12/2021 05/29/2021 05/06/2021 04/01/2021 03/04/2021 07/04/36  Systolic BP 048 889 169 - 450 - 388  Diastolic BP 75 58 75 - 77 - 74  Wt. (Lbs) 127 123.6 123.6 - 126.12 124 -  BMI 22.5 21.89 21.89 - 22.34 21.97 -     Pt electing to have Cardiology address this although I recommend med adjustment today

## 2021-09-10 NOTE — Patient Instructions (Addendum)
F/IU in 6 months, call if you need me sooner  Please schedule mamogram past , due, please schedule at checkout  Lipid and vit D today  Shingrix vaccines are due please get at pharamcy  You are referred to Orthopedics and also to Dermatology  Blood pressure is consistently high, I recommend medication dose increase, you are electing to wait for Cardiology to addres this, please DO remember to discuss at your December visit. Cutting back cigarettes will also help to lower BP  Aim for 3/4 PPD by year end, now at 1 PPD  Thanks for choosing [facility], we consider it a privelige to serve you.

## 2021-09-10 NOTE — Assessment & Plan Note (Signed)
Asked:confirms currently smokes cigarettes 1 PPD Assess: Unwilling to set a quit date, but is cutting back Advise: needs to QUIT to reduce risk of cancer, cardio and cerebrovascular disease Assist: counseled for 5 minutes and literature provided Arrange: follow up in 2 to 4 months

## 2021-09-10 NOTE — Assessment & Plan Note (Signed)
6 month history , above right upper lip, irritating wants eval

## 2021-09-10 NOTE — Assessment & Plan Note (Signed)
Worsening right hip pain, daily rated at 10 with ambulation/ weight bearing, started 6 months ago now unbearable, seen at [facility] , no progreess per pt refer to ortho in [place]

## 2021-09-10 NOTE — Assessment & Plan Note (Signed)

## 2021-09-11 ENCOUNTER — Encounter: Payer: Self-pay | Admitting: Family Medicine

## 2021-09-11 LAB — LIPID PANEL
Chol/HDL Ratio: 2.7 ratio (ref 0.0–4.4)
Cholesterol, Total: 209 mg/dL — ABNORMAL HIGH (ref 100–199)
HDL: 77 mg/dL (ref >39)
LDL Chol Calc (NIH): 102 mg/dL — ABNORMAL HIGH (ref 0–99)
Triglycerides: 176 mg/dL — ABNORMAL HIGH (ref 0–149)
VLDL Cholesterol Cal: 30 mg/dL (ref 5–40)

## 2021-09-11 LAB — VITAMIN D 25 HYDROXY (VIT D DEFICIENCY, FRACTURES): Vit D, 25-Hydroxy: 29.4 ng/mL — ABNORMAL LOW (ref 30.0–100.0)

## 2021-09-12 ENCOUNTER — Other Ambulatory Visit: Payer: Self-pay

## 2021-09-12 DIAGNOSIS — E785 Hyperlipidemia, unspecified: Secondary | ICD-10-CM

## 2021-09-12 DIAGNOSIS — I10 Essential (primary) hypertension: Secondary | ICD-10-CM

## 2021-09-12 MED ORDER — EZETIMIBE 10 MG PO TABS: 10.0000 mg | ORAL_TABLET | Freq: Every day | ORAL | 1 refills | Status: DC

## 2021-09-16 DIAGNOSIS — J432 Centrilobular emphysema: Secondary | ICD-10-CM | POA: Diagnosis not present

## 2021-09-16 DIAGNOSIS — J449 Chronic obstructive pulmonary disease, unspecified: Secondary | ICD-10-CM | POA: Diagnosis not present

## 2021-09-17 DIAGNOSIS — I10 Essential (primary) hypertension: Secondary | ICD-10-CM | POA: Diagnosis not present

## 2021-09-17 DIAGNOSIS — R002 Palpitations: Secondary | ICD-10-CM | POA: Diagnosis not present

## 2021-09-17 DIAGNOSIS — I6523 Occlusion and stenosis of bilateral carotid arteries: Secondary | ICD-10-CM | POA: Diagnosis not present

## 2021-09-17 DIAGNOSIS — I2510 Atherosclerotic heart disease of native coronary artery without angina pectoris: Secondary | ICD-10-CM | POA: Diagnosis not present

## 2021-09-17 DIAGNOSIS — I480 Paroxysmal atrial fibrillation: Secondary | ICD-10-CM | POA: Diagnosis not present

## 2021-09-17 DIAGNOSIS — E782 Mixed hyperlipidemia: Secondary | ICD-10-CM | POA: Diagnosis not present

## 2021-09-17 DIAGNOSIS — I471 Supraventricular tachycardia: Secondary | ICD-10-CM | POA: Diagnosis not present

## 2021-09-17 DIAGNOSIS — I70219 Atherosclerosis of native arteries of extremities with intermittent claudication, unspecified extremity: Secondary | ICD-10-CM | POA: Diagnosis not present

## 2021-09-17 DIAGNOSIS — F17210 Nicotine dependence, cigarettes, uncomplicated: Secondary | ICD-10-CM | POA: Diagnosis not present

## 2021-09-18 ENCOUNTER — Other Ambulatory Visit: Payer: Self-pay

## 2021-09-18 ENCOUNTER — Ambulatory Visit (INDEPENDENT_AMBULATORY_CARE_PROVIDER_SITE_OTHER): Payer: Medicare HMO

## 2021-09-18 ENCOUNTER — Ambulatory Visit (INDEPENDENT_AMBULATORY_CARE_PROVIDER_SITE_OTHER): Payer: Medicare HMO | Admitting: Orthopaedic Surgery

## 2021-09-18 DIAGNOSIS — M25551 Pain in right hip: Secondary | ICD-10-CM
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
## 2021-09-19 ENCOUNTER — Ambulatory Visit (HOSPITAL_COMMUNITY)
Admission: RE | Admit: 2021-09-19 | Discharge: 2021-09-19 | Disposition: A | Discharge status: Home / Self Care | Payer: Medicare HMO | Source: Ambulatory Visit | Attending: Family Medicine | Admitting: Family Medicine

## 2021-09-19 DIAGNOSIS — Z1231 Encounter for screening mammogram for malignant neoplasm of breast: Secondary | ICD-10-CM | POA: Diagnosis not present

## 2021-09-20 DIAGNOSIS — G629 Polyneuropathy, unspecified: Secondary | ICD-10-CM | POA: Diagnosis not present

## 2021-09-20 DIAGNOSIS — G8929 Other chronic pain: Secondary | ICD-10-CM | POA: Diagnosis not present

## 2021-09-20 DIAGNOSIS — Z6822 Body mass index (BMI) 22.0-22.9, adult: Secondary | ICD-10-CM | POA: Diagnosis not present

## 2021-09-20 DIAGNOSIS — M25571 Pain in right ankle and joints of right foot: Secondary | ICD-10-CM | POA: Diagnosis not present

## 2021-09-20 DIAGNOSIS — Z79899 Other long term (current) drug therapy: Secondary | ICD-10-CM | POA: Diagnosis not present

## 2021-09-20 DIAGNOSIS — J438 Other emphysema: Secondary | ICD-10-CM | POA: Diagnosis not present

## 2021-10-01 DIAGNOSIS — I480 Paroxysmal atrial fibrillation: Secondary | ICD-10-CM | POA: Diagnosis not present

## 2021-10-01 DIAGNOSIS — I471 Supraventricular tachycardia: Secondary | ICD-10-CM | POA: Diagnosis not present

## 2021-10-01 DIAGNOSIS — I2510 Atherosclerotic heart disease of native coronary artery without angina pectoris: Secondary | ICD-10-CM | POA: Diagnosis not present

## 2021-10-01 DIAGNOSIS — R002 Palpitations: Secondary | ICD-10-CM | POA: Diagnosis not present

## 2021-10-08 DIAGNOSIS — R002 Palpitations: Secondary | ICD-10-CM | POA: Diagnosis not present

## 2021-10-08 DIAGNOSIS — I471 Supraventricular tachycardia: Secondary | ICD-10-CM | POA: Diagnosis not present

## 2021-10-11 ENCOUNTER — Other Ambulatory Visit: Payer: Self-pay

## 2021-10-11 ENCOUNTER — Ambulatory Visit (HOSPITAL_COMMUNITY)
Admission: RE | Admit: 2021-10-11 | Discharge: 2021-10-11 | Disposition: A | Discharge status: Home / Self Care | Payer: Medicare HMO | Source: Ambulatory Visit | Attending: Orthopaedic Surgery | Admitting: Orthopaedic Surgery

## 2021-10-11 DIAGNOSIS — M25551 Pain in right hip: Secondary | ICD-10-CM | POA: Diagnosis not present

## 2021-10-17 DIAGNOSIS — I700 Atherosclerosis of aorta: Secondary | ICD-10-CM | POA: Diagnosis not present

## 2021-10-17 DIAGNOSIS — I6523 Occlusion and stenosis of bilateral carotid arteries: Secondary | ICD-10-CM | POA: Diagnosis not present

## 2021-10-17 DIAGNOSIS — I2510 Atherosclerotic heart disease of native coronary artery without angina pectoris: Secondary | ICD-10-CM | POA: Diagnosis not present

## 2021-10-17 DIAGNOSIS — I480 Paroxysmal atrial fibrillation: Secondary | ICD-10-CM | POA: Diagnosis not present

## 2021-10-18 DIAGNOSIS — G629 Polyneuropathy, unspecified: Secondary | ICD-10-CM | POA: Diagnosis not present

## 2021-10-18 DIAGNOSIS — Z6822 Body mass index (BMI) 22.0-22.9, adult: Secondary | ICD-10-CM | POA: Diagnosis not present

## 2021-10-18 DIAGNOSIS — G8929 Other chronic pain: Secondary | ICD-10-CM | POA: Diagnosis not present

## 2021-10-18 DIAGNOSIS — R030 Elevated blood-pressure reading, without diagnosis of hypertension: Secondary | ICD-10-CM | POA: Diagnosis not present

## 2021-10-18 DIAGNOSIS — J438 Other emphysema: Secondary | ICD-10-CM | POA: Diagnosis not present

## 2021-10-18 DIAGNOSIS — F17210 Nicotine dependence, cigarettes, uncomplicated: Secondary | ICD-10-CM | POA: Diagnosis not present

## 2021-10-18 DIAGNOSIS — Z79899 Other long term (current) drug therapy: Secondary | ICD-10-CM | POA: Diagnosis not present

## 2021-10-18 DIAGNOSIS — M25571 Pain in right ankle and joints of right foot: Secondary | ICD-10-CM | POA: Diagnosis not present

## 2021-10-21 ENCOUNTER — Ambulatory Visit (INDEPENDENT_AMBULATORY_CARE_PROVIDER_SITE_OTHER): Payer: Medicare HMO | Admitting: Orthopaedic Surgery

## 2021-10-21 ENCOUNTER — Encounter: Payer: Self-pay | Admitting: Orthopaedic Surgery

## 2021-10-21 DIAGNOSIS — M25551 Pain in right hip: Secondary | ICD-10-CM

## 2021-10-21 NOTE — Progress Notes (Signed)
The patient comes in today to go over MRI of her right hip.  She has chronic right hip pain and does see chronic pain specialist.  She has remote injury many many years ago to the hip area.  She has been through outpatient physical therapy and that has not helped.  She cannot take anti-inflammatories because she is on Plavix.  She also reports allergies to steroid injections.  She points to the right hip as source of her pain.  On exam I can easily put her hip through internal and external rotation with no blocks to rotation at all.  The MRI of her right hip is entirely normal other than some mild tendinitis.  The hip cartilage is well-preserved.  There is no evidence of fracture.  There is no lesions.  There is no evidence of avascular necrosis.  I am at a loss of what else I can recommend for her.  From a surgical perspective, there is not a surgery that is needed.

## 2021-10-22 DIAGNOSIS — Z79899 Other long term (current) drug therapy: Secondary | ICD-10-CM | POA: Diagnosis not present

## 2021-11-15 DIAGNOSIS — J438 Other emphysema: Secondary | ICD-10-CM | POA: Diagnosis not present

## 2021-11-15 DIAGNOSIS — Z6822 Body mass index (BMI) 22.0-22.9, adult: Secondary | ICD-10-CM | POA: Diagnosis not present

## 2021-11-15 DIAGNOSIS — R030 Elevated blood-pressure reading, without diagnosis of hypertension: Secondary | ICD-10-CM | POA: Diagnosis not present

## 2021-11-15 DIAGNOSIS — Z0000 Encounter for general adult medical examination without abnormal findings: Secondary | ICD-10-CM | POA: Diagnosis not present

## 2021-11-15 DIAGNOSIS — M25571 Pain in right ankle and joints of right foot: Secondary | ICD-10-CM | POA: Diagnosis not present

## 2021-11-15 DIAGNOSIS — G8929 Other chronic pain: Secondary | ICD-10-CM | POA: Diagnosis not present

## 2021-11-15 DIAGNOSIS — G629 Polyneuropathy, unspecified: Secondary | ICD-10-CM | POA: Diagnosis not present

## 2021-11-15 DIAGNOSIS — Z79899 Other long term (current) drug therapy: Secondary | ICD-10-CM | POA: Diagnosis not present

## 2021-11-20 DIAGNOSIS — Z79899 Other long term (current) drug therapy: Secondary | ICD-10-CM | POA: Diagnosis not present

## 2021-11-30 ENCOUNTER — Other Ambulatory Visit: Payer: Self-pay | Admitting: Internal Medicine

## 2021-12-05 DIAGNOSIS — H04123 Dry eye syndrome of bilateral lacrimal glands: Secondary | ICD-10-CM | POA: Diagnosis not present

## 2021-12-05 DIAGNOSIS — Z961 Presence of intraocular lens: Secondary | ICD-10-CM | POA: Diagnosis not present

## 2021-12-05 DIAGNOSIS — H1013 Acute atopic conjunctivitis, bilateral: Secondary | ICD-10-CM | POA: Diagnosis not present

## 2021-12-05 DIAGNOSIS — H43812 Vitreous degeneration, left eye: Secondary | ICD-10-CM | POA: Diagnosis not present

## 2021-12-05 DIAGNOSIS — H0288B Meibomian gland dysfunction left eye, upper and lower eyelids: Secondary | ICD-10-CM | POA: Diagnosis not present

## 2021-12-05 DIAGNOSIS — H0288A Meibomian gland dysfunction right eye, upper and lower eyelids: Secondary | ICD-10-CM | POA: Diagnosis not present

## 2021-12-13 ENCOUNTER — Other Ambulatory Visit: Payer: Self-pay | Admitting: Family Medicine

## 2021-12-13 DIAGNOSIS — J438 Other emphysema: Secondary | ICD-10-CM | POA: Diagnosis not present

## 2021-12-13 DIAGNOSIS — Z6822 Body mass index (BMI) 22.0-22.9, adult: Secondary | ICD-10-CM | POA: Diagnosis not present

## 2021-12-13 DIAGNOSIS — R030 Elevated blood-pressure reading, without diagnosis of hypertension: Secondary | ICD-10-CM | POA: Diagnosis not present

## 2021-12-13 DIAGNOSIS — G629 Polyneuropathy, unspecified: Secondary | ICD-10-CM | POA: Diagnosis not present

## 2021-12-13 DIAGNOSIS — Z79899 Other long term (current) drug therapy: Secondary | ICD-10-CM | POA: Diagnosis not present

## 2021-12-13 DIAGNOSIS — M25571 Pain in right ankle and joints of right foot: Secondary | ICD-10-CM | POA: Diagnosis not present

## 2021-12-13 DIAGNOSIS — G8929 Other chronic pain: Secondary | ICD-10-CM | POA: Diagnosis not present

## 2021-12-17 DIAGNOSIS — H43812 Vitreous degeneration, left eye: Secondary | ICD-10-CM | POA: Diagnosis not present

## 2021-12-17 DIAGNOSIS — H26491 Other secondary cataract, right eye: Secondary | ICD-10-CM | POA: Diagnosis not present

## 2022-01-03 ENCOUNTER — Telehealth: Payer: Medicare HMO | Admitting: Physician Assistant

## 2022-01-03 DIAGNOSIS — R3989 Other symptoms and signs involving the genitourinary system: Secondary | ICD-10-CM | POA: Diagnosis not present

## 2022-01-03 MED ORDER — CEPHALEXIN 500 MG PO CAPS: 500.0000 mg | ORAL_CAPSULE | Freq: Two times a day (BID) | ORAL | 0 refills | Status: AC

## 2022-01-03 NOTE — Progress Notes (Signed)

## 2022-01-03 NOTE — Progress Notes (Signed)
I have spent 5 minutes in review of e-visit questionnaire, review and updating patient chart, medical decision making and response to patient.   Hesper Venturella Cody Shavette Shoaff, PA-C    

## 2022-01-10 DIAGNOSIS — J438 Other emphysema: Secondary | ICD-10-CM | POA: Diagnosis not present

## 2022-01-10 DIAGNOSIS — G8929 Other chronic pain: Secondary | ICD-10-CM | POA: Diagnosis not present

## 2022-01-10 DIAGNOSIS — G629 Polyneuropathy, unspecified: Secondary | ICD-10-CM | POA: Diagnosis not present

## 2022-01-10 DIAGNOSIS — M25571 Pain in right ankle and joints of right foot: Secondary | ICD-10-CM | POA: Diagnosis not present

## 2022-01-10 DIAGNOSIS — Z6822 Body mass index (BMI) 22.0-22.9, adult: Secondary | ICD-10-CM | POA: Diagnosis not present

## 2022-01-10 DIAGNOSIS — Z79899 Other long term (current) drug therapy: Secondary | ICD-10-CM | POA: Diagnosis not present

## 2022-01-10 DIAGNOSIS — R030 Elevated blood-pressure reading, without diagnosis of hypertension: Secondary | ICD-10-CM | POA: Diagnosis not present

## 2022-01-28 DIAGNOSIS — H02883 Meibomian gland dysfunction of right eye, unspecified eyelid: Secondary | ICD-10-CM | POA: Diagnosis not present

## 2022-01-28 DIAGNOSIS — H04123 Dry eye syndrome of bilateral lacrimal glands: Secondary | ICD-10-CM | POA: Diagnosis not present

## 2022-01-28 DIAGNOSIS — H02886 Meibomian gland dysfunction of left eye, unspecified eyelid: Secondary | ICD-10-CM | POA: Diagnosis not present

## 2022-01-28 DIAGNOSIS — Z961 Presence of intraocular lens: Secondary | ICD-10-CM | POA: Diagnosis not present

## 2022-01-28 DIAGNOSIS — H43812 Vitreous degeneration, left eye: Secondary | ICD-10-CM | POA: Diagnosis not present

## 2022-01-28 DIAGNOSIS — H26491 Other secondary cataract, right eye: Secondary | ICD-10-CM | POA: Diagnosis not present

## 2022-02-05 DIAGNOSIS — M25571 Pain in right ankle and joints of right foot: Secondary | ICD-10-CM | POA: Diagnosis not present

## 2022-02-05 DIAGNOSIS — G629 Polyneuropathy, unspecified: Secondary | ICD-10-CM | POA: Diagnosis not present

## 2022-02-05 DIAGNOSIS — G8929 Other chronic pain: Secondary | ICD-10-CM | POA: Diagnosis not present

## 2022-02-05 DIAGNOSIS — R030 Elevated blood-pressure reading, without diagnosis of hypertension: Secondary | ICD-10-CM | POA: Diagnosis not present

## 2022-02-05 DIAGNOSIS — Z79899 Other long term (current) drug therapy: Secondary | ICD-10-CM | POA: Diagnosis not present

## 2022-02-05 DIAGNOSIS — Z6822 Body mass index (BMI) 22.0-22.9, adult: Secondary | ICD-10-CM | POA: Diagnosis not present

## 2022-02-05 DIAGNOSIS — J438 Other emphysema: Secondary | ICD-10-CM | POA: Diagnosis not present

## 2022-02-13 ENCOUNTER — Other Ambulatory Visit: Payer: Self-pay | Admitting: Family Medicine

## 2022-02-13 DIAGNOSIS — I10 Essential (primary) hypertension: Secondary | ICD-10-CM | POA: Diagnosis not present

## 2022-02-13 DIAGNOSIS — I493 Ventricular premature depolarization: Secondary | ICD-10-CM | POA: Diagnosis not present

## 2022-02-13 DIAGNOSIS — I700 Atherosclerosis of aorta: Secondary | ICD-10-CM | POA: Diagnosis not present

## 2022-02-13 DIAGNOSIS — I6523 Occlusion and stenosis of bilateral carotid arteries: Secondary | ICD-10-CM | POA: Diagnosis not present

## 2022-02-13 DIAGNOSIS — E782 Mixed hyperlipidemia: Secondary | ICD-10-CM | POA: Diagnosis not present

## 2022-02-13 DIAGNOSIS — Z79899 Other long term (current) drug therapy: Secondary | ICD-10-CM | POA: Diagnosis not present

## 2022-02-13 DIAGNOSIS — Z5181 Encounter for therapeutic drug level monitoring: Secondary | ICD-10-CM | POA: Diagnosis not present

## 2022-02-13 DIAGNOSIS — I2510 Atherosclerotic heart disease of native coronary artery without angina pectoris: Secondary | ICD-10-CM | POA: Diagnosis not present

## 2022-02-13 DIAGNOSIS — I471 Supraventricular tachycardia: Secondary | ICD-10-CM | POA: Diagnosis not present

## 2022-02-28 DIAGNOSIS — R030 Elevated blood-pressure reading, without diagnosis of hypertension: Secondary | ICD-10-CM | POA: Diagnosis not present

## 2022-02-28 DIAGNOSIS — M25571 Pain in right ankle and joints of right foot: Secondary | ICD-10-CM | POA: Diagnosis not present

## 2022-02-28 DIAGNOSIS — G629 Polyneuropathy, unspecified: Secondary | ICD-10-CM | POA: Diagnosis not present

## 2022-02-28 DIAGNOSIS — G8929 Other chronic pain: Secondary | ICD-10-CM | POA: Diagnosis not present

## 2022-02-28 DIAGNOSIS — J438 Other emphysema: Secondary | ICD-10-CM | POA: Diagnosis not present

## 2022-02-28 DIAGNOSIS — Z79899 Other long term (current) drug therapy: Secondary | ICD-10-CM | POA: Diagnosis not present

## 2022-03-04 DIAGNOSIS — Z961 Presence of intraocular lens: Secondary | ICD-10-CM | POA: Diagnosis not present

## 2022-03-04 DIAGNOSIS — H0288B Meibomian gland dysfunction left eye, upper and lower eyelids: Secondary | ICD-10-CM | POA: Diagnosis not present

## 2022-03-04 DIAGNOSIS — H1013 Acute atopic conjunctivitis, bilateral: Secondary | ICD-10-CM | POA: Diagnosis not present

## 2022-03-04 DIAGNOSIS — H04123 Dry eye syndrome of bilateral lacrimal glands: Secondary | ICD-10-CM | POA: Diagnosis not present

## 2022-03-04 DIAGNOSIS — H0288A Meibomian gland dysfunction right eye, upper and lower eyelids: Secondary | ICD-10-CM | POA: Diagnosis not present

## 2022-03-04 DIAGNOSIS — H43812 Vitreous degeneration, left eye: Secondary | ICD-10-CM | POA: Diagnosis not present

## 2022-03-11 ENCOUNTER — Ambulatory Visit (INDEPENDENT_AMBULATORY_CARE_PROVIDER_SITE_OTHER): Payer: Medicare HMO | Admitting: Family Medicine

## 2022-03-11 ENCOUNTER — Encounter: Payer: Self-pay | Admitting: Family Medicine

## 2022-03-11 VITALS — BP 150/72 | HR 62 | Ht 63.0 in | Wt 129.1 lb

## 2022-03-11 DIAGNOSIS — R3912 Poor urinary stream: Secondary | ICD-10-CM | POA: Diagnosis not present

## 2022-03-11 DIAGNOSIS — N3001 Acute cystitis with hematuria: Secondary | ICD-10-CM | POA: Diagnosis not present

## 2022-03-11 DIAGNOSIS — I10 Essential (primary) hypertension: Secondary | ICD-10-CM | POA: Diagnosis not present

## 2022-03-11 DIAGNOSIS — E785 Hyperlipidemia, unspecified: Secondary | ICD-10-CM | POA: Diagnosis not present

## 2022-03-11 DIAGNOSIS — F17200 Nicotine dependence, unspecified, uncomplicated: Secondary | ICD-10-CM | POA: Diagnosis not present

## 2022-03-11 DIAGNOSIS — F17210 Nicotine dependence, cigarettes, uncomplicated: Secondary | ICD-10-CM | POA: Diagnosis not present

## 2022-03-11 LAB — POCT URINALYSIS DIP (CLINITEK)
Bilirubin, UA: NEGATIVE
Glucose, UA: NEGATIVE mg/dL
Ketones, POC UA: NEGATIVE mg/dL
Leukocytes, UA: NEGATIVE
Nitrite, UA: NEGATIVE
POC PROTEIN,UA: NEGATIVE
Spec Grav, UA: 1.01 (ref 1.010–1.025)
Urobilinogen, UA: 0.2 E.U./dL
pH, UA: 6.5 (ref 5.0–8.0)

## 2022-03-11 MED ORDER — VALSARTAN 320 MG PO TABS: 320.0000 mg | ORAL_TABLET | Freq: Every day | ORAL | 3 refills | Status: DC

## 2022-03-11 NOTE — Assessment & Plan Note (Signed)
Unchanged ?Asked:confirms currently smokes cigarettes 1 PPD ?Assess: Unwilling to set a quit date, not actively  cutting back ?Advise: needs to QUIT to reduce risk of cancer, cardio and cerebrovascular disease ?Assist: counseled for 5 minutes and literature provided ?Arrange: follow up in 2 to 4 months ? ?

## 2022-03-11 NOTE — Assessment & Plan Note (Signed)
Poor urinary stream and frequency, CCUA and send for c/S, also refer to Urology ?

## 2022-03-11 NOTE — Assessment & Plan Note (Signed)
Uncontrolled, inc diovan dose and re assess ?DASH diet and commitment to daily physical activity for a minimum of 30 minutes discussed and encouraged, as a part of hypertension management. ?The importance of attaining a healthy weight is also discussed. ? ? ?  03/11/2022  ? 10:17 AM 09/10/2021  ?  9:59 AM 06/12/2021  ?  9:10 AM 05/29/2021  ?  9:03 AM 04/01/2021  ?  9:21 AM 03/04/2021  ?  8:32 AM 02/25/2021  ?  3:33 PM  ?BP/Weight  ?Systolic BP 720 721 828 833 138  157  ?Diastolic BP 72 75 58 75 77  74  ?Wt. (Lbs) 129.08 127 123.6 123.6 126.12 124   ?BMI 22.87 kg/m2 22.5 kg/m2 21.89 kg/m2 21.89 kg/m2 22.34 kg/m2 21.97 kg/m2   ? ? ? ? ?

## 2022-03-11 NOTE — Assessment & Plan Note (Signed)
Hyperlipidemia:Low fat diet discussed and encouraged. ? ? ?Lipid Panel  ?Lab Results  ?Component Value Date  ? CHOL 209 (H) 09/10/2021  ? HDL 77 09/10/2021  ? LDLCALC 102 (H) 09/10/2021  ? TRIG 176 (H) 09/10/2021  ? CHOLHDL 2.7 09/10/2021  ? ? ? ?Updated lab needed at/ before next visit. ? ?

## 2022-03-11 NOTE — Progress Notes (Signed)
? ?  [name]     MRN: [id number]      DOB: 1951/04/26 ? ? ?HPI ?[name] is here for follow up and re-evaluation of chronic medical conditions, medication management and review of any available recent lab and radiology data.  ?Preventive health is updated, specifically  Cancer screening and Immunization.   ?Questions or concerns regarding consultations or procedures which the PT has had in the interim are  addressed. ?Reports intolerable diarrhea with amlodipine, had  to stop it and the blood pressure is high as a result ?C/o difficulty voiding , has to push and strain to empty, had 2 procedures on bladder for obstruction to flow as a teen, reports significant frequency ?Still smokes 1 PPD ? ? ?ROS ?Denies recent fever or chills. ?Denies sinus pressure, nasal congestion, ear pain or sore throat. ?Denies chest congestion, productive cough or wheezing. ?Denies chest pains, palpitations and leg swelling ?Chronic  joint pain,  and limitation in mobility.requests handicap paring sticker ?Denies headaches, seizures, chronic lower extremity numbness, and tingling. ?Denies  uncontrolled depression, anxiety or insomnia. ?Denies skin break down or rash. ? ? ?PE ? ?BP (!) 150/72   Pulse 62   Ht 5\' 3"  (1.6 m)   Wt 129 lb 1.3 oz (58.6 kg)   SpO2 92%   BMI 22.87 kg/m?  ? ?Patient alert and oriented and in no cardiopulmonary distress. ? ?HEENT: No facial asymmetry, EOMI,     Neck supple . ? ?Chest: Clear to auscultation bilaterally.Decreased air entry ? ?CVS: S1, S2 no murmurs, no S3.Regular rate. ? ?ABD: Soft non tender.  ? ?Ext: No edema ? ?MS:  reduced ROM spine, shoulders, hips and knees. ? ?Skin: Intact, no ulcerations or rash noted. ? ?Psych: Good eye contact, normal affect. Memory intact not anxious or depressed appearing. ? ?CNS: CN 2-12 intact, power,  normal throughout.no focal deficits noted. ? ? ?Assessment & Plan ? ?Poor urinary stream ?Poor urinary stream and frequency, CCUA and send for c/S, also refer to  Urology ? ?NICOTINE ADDICTION ?Unchanged ?Asked:confirms currently smokes cigarettes 1 PPD ?Assess: Unwilling to set a quit date, not actively  cutting back ?Advise: needs to QUIT to reduce risk of cancer, cardio and cerebrovascular disease ?Assist: counseled for 5 minutes and literature provided ?Arrange: follow up in 2 to 4 months ? ? ?Essential hypertension ?Uncontrolled, inc diovan dose and re assess ?DASH diet and commitment to daily physical activity for a minimum of 30 minutes discussed and encouraged, as a part of hypertension management. ?The importance of attaining a healthy weight is also discussed. ? ? ?  03/11/2022  ? 10:17 AM 09/10/2021  ?  9:59 AM 06/12/2021  ?  9:10 AM 05/29/2021  ?  9:03 AM 04/01/2021  ?  9:21 AM 03/04/2021  ?  8:32 AM 02/25/2021  ?  3:33 PM  ?BP/Weight  ?Systolic BP 330 076 226 333 138  157  ?Diastolic BP 72 75 58 75 77  74  ?Wt. (Lbs) 129.08 127 123.6 123.6 126.12 124   ?BMI 22.87 kg/m2 22.5 kg/m2 21.89 kg/m2 21.89 kg/m2 22.34 kg/m2 21.97 kg/m2   ? ? ? ? ? ?Hyperlipemia ?Hyperlipidemia:Low fat diet discussed and encouraged. ? ? ?Lipid Panel  ?Lab Results  ?Component Value Date  ? CHOL 209 (H) 09/10/2021  ? HDL 77 09/10/2021  ? LDLCALC 102 (H) 09/10/2021  ? TRIG 176 (H) 09/10/2021  ? CHOLHDL 2.7 09/10/2021  ? ? ? ?Updated lab needed at/ before next visit. ? ? ?

## 2022-03-11 NOTE — Patient Instructions (Signed)
F/u in 8  to 10 weeks, re evaluate blood pressure ,call if you need me sooner ? ?Increase diovan to 320 mg daily, for blood pressure control ? ?Labs today,  lipid, cmp and eGFr, cBC, tSH and Vit D ? ?Handicap sticker permanent ? ?Nurse please obtain and document shingrix #2 from [facility] ? ?Smoking 1 PPD, please try to cut down on this ? ?You are referred to Urologist ? ?Thanks for choosing [facility], we consider it a privelige to serve you. ? ?

## 2022-03-12 LAB — CMP14+EGFR
ALT: 39 IU/L — ABNORMAL HIGH (ref 0–32)
AST: 31 IU/L (ref 0–40)
Albumin/Globulin Ratio: 2.2 (ref 1.2–2.2)
Albumin: 4.8 g/dL — ABNORMAL HIGH (ref 3.7–4.7)
Alkaline Phosphatase: 77 IU/L (ref 44–121)
BUN/Creatinine Ratio: 16 (ref 12–28)
BUN: 10 mg/dL (ref 8–27)
Bilirubin Total: 0.3 mg/dL (ref 0.0–1.2)
CO2: 25 mmol/L (ref 20–29)
Calcium: 9.6 mg/dL (ref 8.7–10.3)
Chloride: 100 mmol/L (ref 96–106)
Creatinine, Ser: 0.62 mg/dL (ref 0.57–1.00)
Globulin, Total: 2.2 g/dL (ref 1.5–4.5)
Glucose: 96 mg/dL (ref 70–99)
Potassium: 4.5 mmol/L (ref 3.5–5.2)
Sodium: 139 mmol/L (ref 134–144)
Total Protein: 7 g/dL (ref 6.0–8.5)
eGFR: 95 mL/min/{1.73_m2} (ref >59)

## 2022-03-12 LAB — LIPID PANEL
Chol/HDL Ratio: 2.6 ratio (ref 0.0–4.4)
Cholesterol, Total: 160 mg/dL (ref 100–199)
HDL: 62 mg/dL (ref >39)
LDL Chol Calc (NIH): 69 mg/dL (ref 0–99)
Triglycerides: 174 mg/dL — ABNORMAL HIGH (ref 0–149)
VLDL Cholesterol Cal: 29 mg/dL (ref 5–40)

## 2022-03-14 LAB — URINE CULTURE

## 2022-03-17 ENCOUNTER — Ambulatory Visit (INDEPENDENT_AMBULATORY_CARE_PROVIDER_SITE_OTHER): Payer: Medicare HMO | Admitting: Dermatology

## 2022-03-17 DIAGNOSIS — L570 Actinic keratosis: Secondary | ICD-10-CM | POA: Diagnosis not present

## 2022-03-17 DIAGNOSIS — L821 Other seborrheic keratosis: Secondary | ICD-10-CM

## 2022-03-17 DIAGNOSIS — L578 Other skin changes due to chronic exposure to nonionizing radiation: Secondary | ICD-10-CM

## 2022-03-17 DIAGNOSIS — B079 Viral wart, unspecified: Secondary | ICD-10-CM | POA: Diagnosis not present

## 2022-03-17 NOTE — Patient Instructions (Addendum)
Actinic Keratosis ?An actinic keratosis is a precancerous growth on the skin. If there is more than one growth, the condition is called actinic keratoses. Actinic keratoses appear most often on areas of skin that get a lot of sun exposure, including the scalp, face, ears, lips, upper back, forearms, and the backs of the hands. ?If left untreated, these growths may develop into a skin cancer called squamous cell carcinoma. It is important to have all these growths checked by a health care provider to determine the best treatment approach. ?What are the causes? ?Actinic keratoses are caused by getting too much ultraviolet (UV) radiation from the sun or other UV light sources. ?What increases the risk? ?You are more likely to develop this condition if you: ?Have light-colored skin and blue eyes. ?Have blond or red hair. ?Spend a lot of time in the sun. ?Do not protect your skin from the sun when outdoors. ?Are an older person. The risk of developing an actinic keratosis increases with age. ?What are the signs or symptoms? ?Actinic keratoses feel like scaly, rough spots of skin. Symptoms of this condition include growths that may: ?Be as small as a pinhead or as big as a quarter. ?Itch, hurt, or feel sensitive. ?Be skin-colored, light tan, dark tan, pink, or a combination of any of these colors. In most cases, the growths become red. ?Have a small piece of pink or gray skin (skin tag) growing from them. ?It may be easier to notice actinic keratoses by feeling them, rather than seeing them. Sometimes, actinic keratoses disappear, but many reappear a few days to a few weeks later. ?How is this diagnosed? ?This condition is usually diagnosed with a physical exam. ?A tissue sample may be removed from the actinic keratosis and examined under a microscope (biopsy). ?How is this treated? ?If needed, this condition may be treated by: ?Scraping off the actinic keratosis (curettage). ?Freezing the actinic keratosis with liquid  nitrogen (cryosurgery). This causes the growth to eventually fall off the skin. ?Applying medicated creams or gels to destroy the cells in the growth. ?Applying chemicals to the actinic keratosis to make the outer layers of skin peel off (chemical peel). ?Using photodynamic therapy. In this procedure, medicated cream is applied to the actinic keratosis. This cream increases your skin's sensitivity to light. Then, a strong light is aimed at the actinic keratosis to destroy cells in the growth. ?Follow these instructions at home: ?Skin care ?Apply cool, wet cloths (cool compresses) to the affected areas. ?Do not scratch your skin. ?Check your skin regularly for any growths, especially growths that: ?Start to itch or bleed. ?Change in size, shape, or color. ?Caring for the treated area ?Keep the treated area clean and dry as told by your health care provider. ?Do not apply any medicine, cream, or lotion to the treated area unless your health care provider tells you to do that. ?Do not pick at blisters or try to break them open. This can cause infection and scarring. ?If you have red or irritated skin after treatment, follow instructions from your health care provider about how to take care of the treated area. Make sure you: ?Wash your hands with soap and water before you change your bandage (dressing). If soap and water are not available, use hand sanitizer. ?Change your dressing as told by your health care provider. ?If you have red or irritated skin after treatment, check your treated area every day for signs of infection. Check for: ?Redness, swelling, or pain. ?Fluid or blood. ?  Warmth. ?Pus or a bad smell. ?General instructions ?Take or apply over-the-counter and prescription medicines only as told by your health care provider. ?Return to your normal activities as told by your health care provider. Ask your health care provider what activities are safe for you. ?Have a skin exam done every year by a health care  provider who is a skin specialist (dermatologist). ?Keep all follow-up visits as told by your health care provider. This is important. ?Lifestyle ?Do not use any products that contain nicotine or tobacco, such as cigarettes and e-cigarettes. If you need help quitting, ask your health care provider. ?Take steps to protect your skin from the sun. ?Try to avoid the sun between 10:00 a.m. and 4:00 p.m. This is when the UV light is the strongest. ?Use a sunscreen or sunblock with SPF 30 (sun protection factor 30) or greater. ?Apply sunscreen before you are exposed to sunlight and reapply as often as directed by the instructions on the sunscreen container. ?Always wear sunglasses that have UV protection, and always wear a hat and clothing to protect your skin from sunlight. ?When possible, avoid medicines that increase your sensitivity to sunlight. ?Do not use tanning beds or other indoor tanning devices. ?Contact a health care provider if: ?You notice any changes or new growths on your skin. ?You have swelling, pain, or more redness around your treated area. ?You have fluid or blood coming from your treated area. ?Your treated area feels warm to the touch. ?You have pus or a bad smell coming from your treated area. ?You have a fever. ?You have a blister that becomes large and painful. ?Summary ?An actinic keratosis is a precancerous growth on the skin. If there is more than one growth, the condition is called actinic keratoses. In some cases, if left untreated, these growths can develop into skin cancer. ?Check your skin regularly for any growths, especially growths that start to itch or bleed, or change in size, shape, or color. ?Take steps to protect your skin from the sun. ?Contact a health care provider if you notice any changes or new growths on your skin. ?Keep all follow-up visits as told by your health care provider. This is important. ?This information is not intended to replace advice given to you by your  health care provider. Make sure you discuss any questions you have with your health care provider. ?Document Revised: 09/10/2021 Document Reviewed: 08/21/2021 ?[employer]vier Patient Education ? [place]. ? ?Seborrheic Keratosis ? ?What causes seborrheic keratoses? ?Seborrheic keratoses are harmless, common skin growths that first appear during adult life.  As time goes by, more growths appear.  Some people may develop a large number of them.  Seborrheic keratoses appear on both covered and uncovered body parts.  They are not caused by sunlight.  The tendency to develop seborrheic keratoses can be inherited.  They vary in color from skin-colored to gray, brown, or even black.  They can be either smooth or have a rough, warty surface.   ?Seborrheic keratoses are superficial and look as if they were stuck on the skin.  Under the microscope this type of keratosis looks like layers upon layers of skin.  That is why at times the top layer may seem to fall off, but the rest of the growth remains and re-grows.   ? ?Treatment ?Seborrheic keratoses do not need to be treated, but can easily be removed in the office.  Seborrheic keratoses often cause symptoms when they rub on clothing or jewelry.  Lesions can be in the way of shaving.  If they become inflamed, they can cause itching, soreness, or burning.  Removal of a seborrheic keratosis can be accomplished by freezing, burning, or surgery. ?If any spot bleeds, scabs, or grows rapidly, please return to have it checked, as these can be an indication of a skin cancer. ? ? ?If You Need Anything After Your Visit ? ?If you have any questions or concerns for your doctor, please call our main line at [phone] and press option 4 to reach your doctor's medical assistant. If no one answers, please leave a voicemail as directed and we will return your call as soon as possible. Messages left after 4 pm will be answered the following business day.  ? ?You may also send [place] a  message via MyChart. We typically respond to MyChart messages within 1-2 business days. ? ?For prescription refills, please ask your pharmacy to contact our office. Our fax number is [phone]. ? ?If you have an

## 2022-03-17 NOTE — Progress Notes (Signed)
New Patient Visit  Subjective  [name] is a 71 y.o. female who presents for the following: Irregular skin lesion (On the R upper lip - felt like shards of glass she keep plucking them out. Has since resolved. Pt has also noticed a texture change on her nose that she is concerned about and would like checked today.). The patient has spots, moles and lesions to be evaluated, some may be new or changing and the patient has concerns that these could be cancer.  The following portions of the chart were reviewed this encounter and updated as appropriate:   Tobacco  Allergies  Meds  Problems  Med Hx  Surg Hx  Fam Hx     Review of Systems:  No other skin or systemic complaints except as noted in HPI or Assessment and Plan.  Objective  Well appearing patient in no apparent distress; mood and affect are within normal limits.  A focused examination was performed including the face, arms, legs. Relevant physical exam findings are noted in the Assessment and Plan.  R upper lip x 1, nose x 4, R med cheek x 1 (6) Erythematous thin papules/macules with gritty scale.   R middle finger x 1 Verrucous papules -- Discussed viral etiology and contagion.    Assessment & Plan  AK (actinic keratosis) (6) R upper lip x 1, nose x 4, R med cheek x 1  Destruction of lesion - R upper lip x 1, nose x 4, R med cheek x 1 Complexity: simple   Destruction method: cryotherapy   Informed consent: discussed and consent obtained   Timeout:  patient name, date of birth, surgical site, and procedure verified Lesion destroyed using liquid nitrogen: Yes   Region frozen until ice ball extended beyond lesion: Yes   Outcome: patient tolerated procedure well with no complications   Post-procedure details: wound care instructions given    Viral warts, unspecified type R middle finger x 1  Discussed viral etiology and risk of spread.  Discussed multiple treatments may be required to clear warts.  Discussed  possible post-treatment dyspigmentation and risk of recurrence.  Destruction of lesion - R middle finger x 1 Complexity: simple   Destruction method: cryotherapy   Informed consent: discussed and consent obtained   Timeout:  patient name, date of birth, surgical site, and procedure verified Lesion destroyed using liquid nitrogen: Yes   Region frozen until ice ball extended beyond lesion: Yes   Outcome: patient tolerated procedure well with no complications   Post-procedure details: wound care instructions given    Actinic Damage - Severe, confluent actinic changes with pre-cancerous actinic keratoses  - Severe, chronic, not at goal, secondary to cumulative UV radiation exposure over time - diffuse scaly erythematous macules and papules with underlying dyspigmentation - Discussed Prescription "Field Treatment" for Severe, Chronic Confluent Actinic Changes with Pre-Cancerous Actinic Keratoses Field treatment involves treatment of an entire area of skin that has confluent Actinic Changes (Sun/ Ultraviolet light damage) and PreCancerous Actinic Keratoses by method of PhotoDynamic Therapy (PDT) and/or prescription Topical Chemotherapy agents such as 5-fluorouracil, 5-fluorouracil/calcipotriene, and/or imiquimod.  The purpose is to decrease the number of clinically evident and subclinical PreCancerous lesions to prevent progression to development of skin cancer by chemically destroying early precancer changes that may or may not be visible.  It has been shown to reduce the risk of developing skin cancer in the treated area. As a result of treatment, redness, scaling, crusting, and open sores may occur during  treatment course. One or more than one of these methods may be used and may have to be used several times to control, suppress and eliminate the PreCancerous changes. Discussed treatment course, expected reaction, and possible side effects. - Recommend daily broad spectrum sunscreen SPF 30+ to  sun-exposed areas, reapply every 2 hours as needed.  - Staying in the shade or wearing long sleeves, sun glasses (UVA+UVB protection) and wide brim hats (4-inch brim around the entire circumference of the hat) are also recommended. - Call for new or changing lesions.  Seborrheic Keratoses - Stuck-on, waxy, tan-brown papules and/or plaques  - Benign-appearing - Discussed benign etiology and prognosis. - Observe - Call for any changes  Return in about 3 months (around 06/17/2022) for AK follow up .  Luther Redo, CMA, am acting as scribe for Sarina Ser, MD .  Documentation: I have reviewed the above documentation for accuracy and completeness, and I agree with the above.  Sarina Ser, MD

## 2022-03-18 ENCOUNTER — Telehealth: Payer: Self-pay | Admitting: Family Medicine

## 2022-03-18 NOTE — Telephone Encounter (Signed)
Patient returning call.

## 2022-03-18 NOTE — Telephone Encounter (Signed)
Patient called in regard to valsartan (DIOVAN) 320 MG tablet  ? ?On last visit on 5/8 was to increase med to 320mg  , that is the current dosage patient has been taking.  ? ?Patient wants a call back in regard to clarify. ?

## 2022-03-18 NOTE — Telephone Encounter (Signed)
#   BUSY ?

## 2022-03-18 NOTE — Telephone Encounter (Signed)
LMTRC

## 2022-03-19 NOTE — Telephone Encounter (Signed)
LMTRC

## 2022-03-20 ENCOUNTER — Other Ambulatory Visit: Payer: Self-pay | Admitting: Family Medicine

## 2022-03-20 ENCOUNTER — Encounter: Payer: Self-pay | Admitting: Family Medicine

## 2022-03-20 MED ORDER — HYDRALAZINE HCL 10 MG PO TABS: 10.0000 mg | ORAL_TABLET | Freq: Three times a day (TID) | ORAL | 2 refills | Status: DC

## 2022-03-20 NOTE — Telephone Encounter (Signed)
See last msg patient aware  ?

## 2022-03-21 NOTE — Telephone Encounter (Signed)
Patient aware.
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
## 2022-04-01 ENCOUNTER — Ambulatory Visit (INDEPENDENT_AMBULATORY_CARE_PROVIDER_SITE_OTHER): Payer: Medicare HMO | Admitting: Urology

## 2022-04-01 ENCOUNTER — Encounter: Payer: Self-pay | Admitting: Dermatology

## 2022-04-01 ENCOUNTER — Encounter: Payer: Self-pay | Admitting: Urology

## 2022-04-01 VITALS — BP 154/90 | HR 65

## 2022-04-01 DIAGNOSIS — R350 Frequency of micturition: Secondary | ICD-10-CM | POA: Diagnosis not present

## 2022-04-01 DIAGNOSIS — R3915 Urgency of urination: Secondary | ICD-10-CM

## 2022-04-01 DIAGNOSIS — R3912 Poor urinary stream: Secondary | ICD-10-CM | POA: Diagnosis not present

## 2022-04-01 DIAGNOSIS — R351 Nocturia: Secondary | ICD-10-CM | POA: Diagnosis not present

## 2022-04-01 LAB — URINALYSIS, ROUTINE W REFLEX MICROSCOPIC
Bilirubin, UA: NEGATIVE
Glucose, UA: NEGATIVE
Ketones, UA: NEGATIVE
Leukocytes,UA: NEGATIVE
Nitrite, UA: NEGATIVE
RBC, UA: NEGATIVE
Specific Gravity, UA: 1.02 (ref 1.005–1.030)
Urobilinogen, Ur: 0.2 mg/dL (ref 0.2–1.0)
pH, UA: 5.5 (ref 5.0–7.5)

## 2022-04-01 LAB — MICROSCOPIC EXAMINATION
Bacteria, UA: NONE SEEN
RBC, Urine: NONE SEEN /hpf (ref 0–2)
Renal Epithel, UA: NONE SEEN /hpf

## 2022-04-03 DIAGNOSIS — Z79899 Other long term (current) drug therapy: Secondary | ICD-10-CM | POA: Diagnosis not present

## 2022-04-03 DIAGNOSIS — G8929 Other chronic pain: Secondary | ICD-10-CM | POA: Diagnosis not present

## 2022-04-03 DIAGNOSIS — G629 Polyneuropathy, unspecified: Secondary | ICD-10-CM | POA: Diagnosis not present

## 2022-04-03 DIAGNOSIS — M25571 Pain in right ankle and joints of right foot: Secondary | ICD-10-CM | POA: Diagnosis not present

## 2022-04-08 DIAGNOSIS — Z79899 Other long term (current) drug therapy: Secondary | ICD-10-CM | POA: Diagnosis not present

## 2022-04-14 ENCOUNTER — Ambulatory Visit (INDEPENDENT_AMBULATORY_CARE_PROVIDER_SITE_OTHER): Payer: Medicare HMO | Admitting: Family Medicine

## 2022-04-14 ENCOUNTER — Encounter: Payer: Self-pay | Admitting: Family Medicine

## 2022-04-14 VITALS — BP 124/68 | HR 71 | Ht 63.0 in | Wt 128.8 lb

## 2022-04-14 DIAGNOSIS — M1000 Idiopathic gout, unspecified site: Secondary | ICD-10-CM

## 2022-04-14 DIAGNOSIS — M19079 Primary osteoarthritis, unspecified ankle and foot: Secondary | ICD-10-CM | POA: Diagnosis not present

## 2022-04-14 MED ORDER — CAPSAICIN 0.1 % EX CREA: TOPICAL_CREAM | CUTANEOUS | 0 refills | Status: DC

## 2022-04-14 NOTE — Patient Instructions (Signed)
I appreciate the opportunity to provide care to you today!    -please pick up your medication at the pharmacy    Please continue to a heart-healthy diet and increase your physical activities. Try to exercise for 68mins at least three times a week.      It was a pleasure to see you and I look forward to continuing to work together on your health and well-being. Please do not hesitate to call the office if you need care or have questions about your care.   Have a wonderful day and week. With Gratitude, Alvira Monday MSN, FNP-BC
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
## 2022-04-15 LAB — URIC ACID: Uric Acid: 5.9 mg/dL (ref 3.1–7.9)

## 2022-04-15 NOTE — Progress Notes (Signed)
Please inform the patient that her uric acid levels were within normal limits. Since the recommended topical treatments have been ineffective, I recommend that she continue taking the medication Dr. Emelia Loron provided.

## 2022-04-17 NOTE — Assessment & Plan Note (Signed)
-  physical assessment unlikely of gout -symptoms likely of her chronic arthritis -pending uric acid levels -will order topical capsaicin cream

## 2022-05-01 DIAGNOSIS — Z6823 Body mass index (BMI) 23.0-23.9, adult: Secondary | ICD-10-CM | POA: Diagnosis not present

## 2022-05-01 DIAGNOSIS — R030 Elevated blood-pressure reading, without diagnosis of hypertension: Secondary | ICD-10-CM | POA: Diagnosis not present

## 2022-05-01 DIAGNOSIS — Z0130 Encounter for examination of blood pressure without abnormal findings: Secondary | ICD-10-CM | POA: Diagnosis not present

## 2022-05-01 DIAGNOSIS — G629 Polyneuropathy, unspecified: Secondary | ICD-10-CM | POA: Diagnosis not present

## 2022-05-01 DIAGNOSIS — I10 Essential (primary) hypertension: Secondary | ICD-10-CM | POA: Diagnosis not present

## 2022-05-01 DIAGNOSIS — M25571 Pain in right ankle and joints of right foot: Secondary | ICD-10-CM | POA: Diagnosis not present

## 2022-05-01 DIAGNOSIS — Z79899 Other long term (current) drug therapy: Secondary | ICD-10-CM | POA: Diagnosis not present

## 2022-05-01 DIAGNOSIS — E559 Vitamin D deficiency, unspecified: Secondary | ICD-10-CM | POA: Diagnosis not present

## 2022-05-01 DIAGNOSIS — G8929 Other chronic pain: Secondary | ICD-10-CM | POA: Diagnosis not present

## 2022-05-14 ENCOUNTER — Ambulatory Visit (INDEPENDENT_AMBULATORY_CARE_PROVIDER_SITE_OTHER): Payer: Medicare HMO | Admitting: Family Medicine

## 2022-05-14 ENCOUNTER — Encounter: Payer: Self-pay | Admitting: Family Medicine

## 2022-05-14 VITALS — BP 124/78 | HR 80 | Resp 16 | Ht 63.0 in | Wt 128.0 lb

## 2022-05-14 DIAGNOSIS — R490 Dysphonia: Secondary | ICD-10-CM

## 2022-05-14 DIAGNOSIS — F418 Other specified anxiety disorders: Secondary | ICD-10-CM | POA: Diagnosis not present

## 2022-05-14 DIAGNOSIS — I10 Essential (primary) hypertension: Secondary | ICD-10-CM | POA: Diagnosis not present

## 2022-05-14 DIAGNOSIS — F5105 Insomnia due to other mental disorder: Secondary | ICD-10-CM | POA: Diagnosis not present

## 2022-05-14 DIAGNOSIS — F409 Phobic anxiety disorder, unspecified: Secondary | ICD-10-CM | POA: Diagnosis not present

## 2022-05-14 DIAGNOSIS — E785 Hyperlipidemia, unspecified: Secondary | ICD-10-CM

## 2022-05-14 DIAGNOSIS — F411 Generalized anxiety disorder: Secondary | ICD-10-CM | POA: Diagnosis not present

## 2022-05-14 DIAGNOSIS — F17210 Nicotine dependence, cigarettes, uncomplicated: Secondary | ICD-10-CM

## 2022-05-14 MED ORDER — BELSOMRA 5 MG PO TABS: 1.0000 | ORAL_TABLET | Freq: Every day | ORAL | 2 refills | Status: DC

## 2022-05-14 MED ORDER — VALSARTAN 320 MG PO TABS: 320.0000 mg | ORAL_TABLET | Freq: Every day | ORAL | 3 refills | Status: DC
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
## 2022-05-14 NOTE — Patient Instructions (Signed)
F/u in 3 months, call if you need me sooner  Belsomra is prescribed for sleep  You will be referred to ENT re loss of voice and hoarseness, we will be in touch  Blood pressure is good, no medication change  Thanks for choosing [facility], we consider it a privelige to serve you.

## 2022-05-18 ENCOUNTER — Encounter: Payer: Self-pay | Admitting: Family Medicine

## 2022-05-18 DIAGNOSIS — F409 Phobic anxiety disorder, unspecified: Secondary | ICD-10-CM | POA: Insufficient documentation

## 2022-05-18 DIAGNOSIS — R490 Dysphonia: Secondary | ICD-10-CM | POA: Insufficient documentation

## 2022-05-18 NOTE — Assessment & Plan Note (Addendum)
Asked:confirms currently smokes cigarettes Assess: Unwilling to set a quit date, not currently actively  cutting back Advise: needs to QUIT to reduce risk of cancer, cardio and cerebrovascular disease Assist: counseled for 5 minutes and literature provided Arrange: follow up in 2 to 4 months

## 2022-05-18 NOTE — Assessment & Plan Note (Signed)
Worsened Sleep hygiene reviewed and written information offered also. Prescription sent for  medication needed.'Start belsomra

## 2022-05-18 NOTE — Assessment & Plan Note (Signed)
Increased, seems fixated on deteriorating health , increased risk of new or recurrent cancer and dying, reports very poor sleep

## 2022-05-18 NOTE — Assessment & Plan Note (Signed)
Not well controlled , not suicidal or homicidal, refuses therapy, will start med to help with sleep

## 2022-05-18 NOTE — Assessment & Plan Note (Signed)
Progressing chronic painless hoarseness, ongoing nicotine use , refer ENT

## 2022-05-18 NOTE — Assessment & Plan Note (Signed)
Hyperlipidemia:Low fat diet discussed and encouraged.   Lipid Panel  Lab Results  Component Value Date   CHOL 160 03/11/2022   HDL 62 03/11/2022   LDLCALC 69 03/11/2022   TRIG 174 (H) 03/11/2022   CHOLHDL 2.6 03/11/2022     needs to reduce fat in diet, no med change

## 2022-05-18 NOTE — Assessment & Plan Note (Signed)
Controlled, no change in medication DASH diet and commitment to daily physical activity for a minimum of 30 minutes discussed and encouraged, as a part of hypertension management. The importance of attaining a healthy weight is also discussed.     05/14/2022   11:31 AM 05/14/2022   10:33 AM 04/14/2022    8:21 AM 04/01/2022    9:32 AM 03/11/2022   10:17 AM 09/10/2021    9:59 AM 06/12/2021    9:10 AM  BP/Weight  Systolic BP 159 458 592 924 462 863 817  Diastolic BP 78 84 68 90 72 75 58  Wt. (Lbs)  128 128.8  129.08 127 123.6  BMI  22.67 kg/m2 22.82 kg/m2  22.87 kg/m2 22.5 kg/m2 21.89 kg/m2

## 2022-05-20 ENCOUNTER — Ambulatory Visit: Payer: Medicare HMO | Admitting: Urology

## 2022-06-04 DIAGNOSIS — K5792 Diverticulitis of intestine, part unspecified, without perforation or abscess without bleeding: Secondary | ICD-10-CM | POA: Diagnosis not present

## 2022-06-04 DIAGNOSIS — Z0130 Encounter for examination of blood pressure without abnormal findings: Secondary | ICD-10-CM | POA: Diagnosis not present

## 2022-06-04 DIAGNOSIS — G8929 Other chronic pain: Secondary | ICD-10-CM | POA: Diagnosis not present

## 2022-06-04 DIAGNOSIS — R030 Elevated blood-pressure reading, without diagnosis of hypertension: Secondary | ICD-10-CM | POA: Diagnosis not present

## 2022-06-04 DIAGNOSIS — M25571 Pain in right ankle and joints of right foot: Secondary | ICD-10-CM | POA: Diagnosis not present

## 2022-06-04 DIAGNOSIS — Z6823 Body mass index (BMI) 23.0-23.9, adult: Secondary | ICD-10-CM | POA: Diagnosis not present

## 2022-06-04 DIAGNOSIS — I482 Chronic atrial fibrillation, unspecified: Secondary | ICD-10-CM | POA: Diagnosis not present

## 2022-06-04 DIAGNOSIS — M7918 Myalgia, other site: Secondary | ICD-10-CM | POA: Diagnosis not present

## 2022-06-04 DIAGNOSIS — Z79899 Other long term (current) drug therapy: Secondary | ICD-10-CM | POA: Diagnosis not present

## 2022-06-04 DIAGNOSIS — M19071 Primary osteoarthritis, right ankle and foot: Secondary | ICD-10-CM | POA: Diagnosis not present

## 2022-06-05 ENCOUNTER — Ambulatory Visit (INDEPENDENT_AMBULATORY_CARE_PROVIDER_SITE_OTHER): Payer: Medicare HMO

## 2022-06-05 DIAGNOSIS — Z0000 Encounter for general adult medical examination without abnormal findings: Secondary | ICD-10-CM | POA: Diagnosis not present
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
## 2022-06-05 NOTE — Patient Instructions (Signed)
[name] , Thank you for taking time to come for your Medicare Wellness Visit. I appreciate your ongoing commitment to your health goals. Please review the following plan we discussed and let me know if I can assist you in the future.   Screening recommendations/referrals: Colonoscopy: Completed Mammogram: Completed Bone Density: Completed Recommended yearly ophthalmology/optometry visit for glaucoma screening and checkup Recommended yearly dental visit for hygiene and checkup  Vaccinations: Influenza vaccine: Completed Pneumococcal vaccine: Completed Tdap vaccine: Completed Shingles vaccine: Completed    Advanced directives: Patient is working on one with her lawyer  Conditions/risks identified: hypertension, falls  Next appointment: 1 year   Preventive Care 39 Years and Older, Female Preventive care refers to lifestyle choices and visits with your health care provider that can promote health and wellness. What does preventive care include? A yearly physical exam. This is also called an annual well check. Dental exams once or twice a year. Routine eye exams. Ask your health care provider how often you should have your eyes checked. Personal lifestyle choices, including: Daily care of your teeth and gums. Regular physical activity. Eating a healthy diet. Avoiding tobacco and drug use. Limiting alcohol use. Practicing safe sex. Taking low-dose aspirin every day. Taking vitamin and mineral supplements as recommended by your health care provider. What happens during an annual well check? The services and screenings done by your health care provider during your annual well check will depend on your age, overall health, lifestyle risk factors, and family history of disease. Counseling  Your health care provider may ask you questions about your: Alcohol use. Tobacco use. Drug use. Emotional well-being. Home and relationship well-being. Sexual activity. Eating  habits. History of falls. Memory and ability to understand (cognition). Work and work Statistician. Reproductive health. Screening  You may have the following tests or measurements: Height, weight, and BMI. Blood pressure. Lipid and cholesterol levels. These may be checked every 5 years, or more frequently if you are over 25 years old. Skin check. Lung cancer screening. You may have this screening every year starting at age 46 if you have a 30-pack-year history of smoking and currently smoke or have quit within the past 15 years. Fecal occult blood test (FOBT) of the stool. You may have this test every year starting at age 43. Flexible sigmoidoscopy or colonoscopy. You may have a sigmoidoscopy every 5 years or a colonoscopy every 10 years starting at age 12. Hepatitis C blood test. Hepatitis B blood test. Sexually transmitted disease (STD) testing. Diabetes screening. This is done by checking your blood sugar (glucose) after you have not eaten for a while (fasting). You may have this done every 1-3 years. Bone density scan. This is done to screen for osteoporosis. You may have this done starting at age 68. Mammogram. This may be done every 1-2 years. Talk to your health care provider about how often you should have regular mammograms. Talk with your health care provider about your test results, treatment options, and if necessary, the need for more tests. Vaccines  Your health care provider may recommend certain vaccines, such as: Influenza vaccine. This is recommended every year. Tetanus, diphtheria, and acellular pertussis (Tdap, Td) vaccine. You may need a Td booster every 10 years. Zoster vaccine. You may need this after age 11. Pneumococcal 13-valent conjugate (PCV13) vaccine. One dose is recommended after age 14. Pneumococcal polysaccharide (PPSV23) vaccine. One dose is recommended after age 60. Talk to your health care provider about which screenings and vaccines you need and  how  often you need them. This information is not intended to replace advice given to you by your health care provider. Make sure you discuss any questions you have with your health care provider. Document Released: 11/23/2015 Document Revised: 07/16/2016 Document Reviewed: 08/28/2015 Elsevier Interactive Patient Education  2017 [employer] Prevention in the Home Falls can cause injuries. They can happen to people of all ages. There are many things you can do to make your home safe and to help prevent falls. What can I do on the outside of my home? Regularly fix the edges of walkways and driveways and fix any cracks. Remove anything that might make you trip as you walk through a door, such as a raised step or threshold. Trim any bushes or trees on the path to your home. Use bright outdoor lighting. Clear any walking paths of anything that might make someone trip, such as rocks or tools. Regularly check to see if handrails are loose or broken. Make sure that both sides of any steps have handrails. Any raised decks and porches should have guardrails on the edges. Have any leaves, snow, or ice cleared regularly. Use sand or salt on walking paths during winter. Clean up any spills in your garage right away. This includes oil or grease spills. What can I do in the bathroom? Use night lights. Install grab bars by the toilet and in the tub and shower. Do not use towel bars as grab bars. Use non-skid mats or decals in the tub or shower. If you need to sit down in the shower, use a plastic, non-slip stool. Keep the floor dry. Clean up any water that spills on the floor as soon as it happens. Remove soap buildup in the tub or shower regularly. Attach bath mats securely with double-sided non-slip rug tape. Do not have throw rugs and other things on the floor that can make you trip. What can I do in the bedroom? Use night lights. Make sure that you have a light by your bed that is easy to  reach. Do not use any sheets or blankets that are too big for your bed. They should not hang down onto the floor. Have a firm chair that has side arms. You can use this for support while you get dressed. Do not have throw rugs and other things on the floor that can make you trip. What can I do in the kitchen? Clean up any spills right away. Avoid walking on wet floors. Keep items that you use a lot in easy-to-reach places. If you need to reach something above you, use a strong step stool that has a grab bar. Keep electrical cords out of the way. Do not use floor polish or wax that makes floors slippery. If you must use wax, use non-skid floor wax. Do not have throw rugs and other things on the floor that can make you trip. What can I do with my stairs? Do not leave any items on the stairs. Make sure that there are handrails on both sides of the stairs and use them. Fix handrails that are broken or loose. Make sure that handrails are as long as the stairways. Check any carpeting to make sure that it is firmly attached to the stairs. Fix any carpet that is loose or worn. Avoid having throw rugs at the top or bottom of the stairs. If you do have throw rugs, attach them to the floor with carpet tape. Make sure that you have  a light switch at the top of the stairs and the bottom of the stairs. If you do not have them, ask someone to add them for you. What else can I do to help prevent falls? Wear shoes that: Do not have high heels. Have rubber bottoms. Are comfortable and fit you well. Are closed at the toe. Do not wear sandals. If you use a stepladder: Make sure that it is fully opened. Do not climb a closed stepladder. Make sure that both sides of the stepladder are locked into place. Ask someone to hold it for you, if possible. Clearly mark and make sure that you can see: Any grab bars or handrails. First and last steps. Where the edge of each step is. Use tools that help you move  around (mobility aids) if they are needed. These include: Canes. Walkers. Scooters. Crutches. Turn on the lights when you go into a dark area. Replace any light bulbs as soon as they burn out. Set up your furniture so you have a clear path. Avoid moving your furniture around. If any of your floors are uneven, fix them. If there are any pets around you, be aware of where they are. Review your medicines with your doctor. Some medicines can make you feel dizzy. This can increase your chance of falling. Ask your doctor what other things that you can do to help prevent falls. This information is not intended to replace advice given to you by your health care provider. Make sure you discuss any questions you have with your health care provider. Document Released: 08/23/2009 Document Revised: 04/03/2016 Document Reviewed: 12/01/2014 Elsevier Interactive Patient Education  2017 [employer].

## 2022-06-09 ENCOUNTER — Ambulatory Visit (HOSPITAL_COMMUNITY)
Admission: RE | Admit: 2022-06-09 | Discharge: 2022-06-09 | Disposition: A | Discharge status: Home / Self Care | Payer: Medicare HMO | Source: Ambulatory Visit | Attending: Internal Medicine | Admitting: Internal Medicine

## 2022-06-09 DIAGNOSIS — C3490 Malignant neoplasm of unspecified part of unspecified bronchus or lung: Secondary | ICD-10-CM | POA: Diagnosis not present

## 2022-06-09 DIAGNOSIS — I700 Atherosclerosis of aorta: Secondary | ICD-10-CM | POA: Diagnosis not present

## 2022-06-09 DIAGNOSIS — J439 Emphysema, unspecified: Secondary | ICD-10-CM | POA: Diagnosis not present

## 2022-06-09 LAB — POCT I-STAT CREATININE: Creatinine, Ser: 0.7 mg/dL (ref 0.44–1.00)

## 2022-06-09 MED ORDER — IOHEXOL 300 MG/ML  SOLN
75.0000 mL | Freq: Once | INTRAMUSCULAR | Status: AC | PRN
  Administered 2022-06-09: 75 mL via INTRAVENOUS

## 2022-06-11 ENCOUNTER — Encounter: Payer: Self-pay | Admitting: Nurse Practitioner

## 2022-06-11 ENCOUNTER — Ambulatory Visit (INDEPENDENT_AMBULATORY_CARE_PROVIDER_SITE_OTHER): Payer: Medicare HMO | Admitting: Nurse Practitioner

## 2022-06-11 ENCOUNTER — Telehealth: Payer: Self-pay

## 2022-06-11 ENCOUNTER — Other Ambulatory Visit: Payer: Self-pay

## 2022-06-11 ENCOUNTER — Inpatient Hospital Stay: Payer: Medicare HMO | Attending: Internal Medicine | Admitting: Internal Medicine

## 2022-06-11 VITALS — BP 158/68 | HR 62 | Temp 97.6°F | Resp 18 | Wt 128.5 lb

## 2022-06-11 DIAGNOSIS — C3491 Malignant neoplasm of unspecified part of right bronchus or lung: Secondary | ICD-10-CM

## 2022-06-11 DIAGNOSIS — K529 Noninfective gastroenteritis and colitis, unspecified: Secondary | ICD-10-CM | POA: Diagnosis not present

## 2022-06-11 DIAGNOSIS — C3490 Malignant neoplasm of unspecified part of unspecified bronchus or lung: Secondary | ICD-10-CM | POA: Diagnosis not present

## 2022-06-11 DIAGNOSIS — I10 Essential (primary) hypertension: Secondary | ICD-10-CM | POA: Diagnosis not present

## 2022-06-11 DIAGNOSIS — Z79899 Other long term (current) drug therapy: Secondary | ICD-10-CM | POA: Diagnosis not present

## 2022-06-11 DIAGNOSIS — Z85118 Personal history of other malignant neoplasm of bronchus and lung: Secondary | ICD-10-CM | POA: Diagnosis not present

## 2022-06-11 DIAGNOSIS — Z902 Acquired absence of lung [part of]: Secondary | ICD-10-CM | POA: Insufficient documentation

## 2022-06-11 DIAGNOSIS — R1084 Generalized abdominal pain: Secondary | ICD-10-CM | POA: Diagnosis not present

## 2022-06-11 DIAGNOSIS — I700 Atherosclerosis of aorta: Secondary | ICD-10-CM | POA: Insufficient documentation

## 2022-06-11 MED ORDER — DICYCLOMINE HCL 10 MG PO CAPS: 10.0000 mg | ORAL_CAPSULE | Freq: Two times a day (BID) | ORAL | 0 refills | Status: DC | PRN

## 2022-06-11 NOTE — Assessment & Plan Note (Addendum)
Left sided abdominal pain  She recently completed a course of Augmentin for diverticulosis Pain got better but started again yesterday Start dicyclomine 10 mg twice daily as needed Patient told to call back to the office if her symptoms does not improve.
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
## 2022-06-11 NOTE — Progress Notes (Signed)
Virtual Visit via Telephone Note  I connected with [name] @ on 06/12/22 at 1107by telephone and verified that I am speaking with the correct person using two identifiers.  I spent 10 minutes on this telephone encounter  Location: Patient: home Provider: office   I discussed the limitations, risks, security and privacy concerns of performing an evaluation and management service by telephone and the availability of in person appointments. I also discussed with the patient that there may be a patient responsible charge related to this service. The patient expressed understanding and agreed to proceed.   History of Present Illness:  Has [name] with past medical history of essential hypertension, SVT, hiatal hernia,diverticulosis presents with c/o left sided abdominal pain , she stated that her pain management doctor gave her a prescription for antibiotics which she has completed.  Her stomach pain did get better but restarted yesterday, stomach pain feels better when standing she sometimes has diarrhea sometimes constipation.  She describes her pain as a gnawing pain . patient denies fever, chills, nausea, vomiting, bloody stool. Patient does not want to go to the ER for evaluation at this time.   Observations/Objective:   Assessment and Plan: Left sided abdominal pain Left sided abdominal pain  She recently completed a course of Augmentin for diverticulosis Pain got better but started again yesterday Start dicyclomine 10 mg twice daily as needed Patient told to call back to the office if her symptoms does not improve, will order an abdominal CT at that time   Follow Up Instructions:    I discussed the assessment and treatment plan with the patient. The patient was provided an opportunity to ask questions and all were answered. The patient agreed with the plan and demonstrated an understanding of the instructions.   The patient was advised to call back or seek an in-person  evaluation if the symptoms worsen or if the condition fails to improve as anticipated.

## 2022-06-16 ENCOUNTER — Ambulatory Visit (INDEPENDENT_AMBULATORY_CARE_PROVIDER_SITE_OTHER): Payer: Medicare HMO | Admitting: Dermatology

## 2022-06-16 ENCOUNTER — Encounter: Payer: Self-pay | Admitting: Dermatology

## 2022-06-16 DIAGNOSIS — L821 Other seborrheic keratosis: Secondary | ICD-10-CM

## 2022-06-16 DIAGNOSIS — L570 Actinic keratosis: Secondary | ICD-10-CM | POA: Diagnosis not present

## 2022-06-16 DIAGNOSIS — B079 Viral wart, unspecified: Secondary | ICD-10-CM | POA: Diagnosis not present

## 2022-06-16 DIAGNOSIS — L578 Other skin changes due to chronic exposure to nonionizing radiation: Secondary | ICD-10-CM | POA: Diagnosis not present

## 2022-06-16 NOTE — Progress Notes (Signed)
Follow-Up Visit   Subjective  [name] is a 71 y.o. female who presents for the following: Actinic Keratosis (3 month follow up , crusty spots at forehead, right ear, right cheek area. ) and Warts (Hx of wart at right middle finger. ). The patient has spots, moles and lesions to be evaluated, some may be new or changing and the patient has concerns that these could be cancer.  The following portions of the chart were reviewed this encounter and updated as appropriate:  Tobacco  Allergies  Meds  Problems  Med Hx  Surg Hx  Fam Hx     Review of Systems: No other skin or systemic complaints except as noted in HPI or Assessment and Plan.  Objective  Well appearing patient in no apparent distress; mood and affect are within normal limits.  A focused examination was performed including face, right middle finger. Relevant physical exam findings are noted in the Assessment and Plan.  left medial brow x 1, left upper lip x 1 (2) Erythematous thin papules/macules with gritty scale.   right middle finger x 1 Verrucous papules -- Discussed viral etiology and contagion.    Assessment & Plan  Actinic keratosis (2) left medial brow x 1, left upper lip x 1 Actinic keratoses are precancerous spots that appear secondary to cumulative UV radiation exposure/sun exposure over time. They are chronic with expected duration over 1 year. A portion of actinic keratoses will progress to squamous cell carcinoma of the skin. It is not possible to reliably predict which spots will progress to skin cancer and so treatment is recommended to prevent development of skin cancer. Recommend daily broad spectrum sunscreen SPF 30+ to sun-exposed areas, reapply every 2 hours as needed.  Recommend staying in the shade or wearing long sleeves, sun glasses (UVA+UVB protection) and wide brim hats (4-inch brim around the entire circumference of the hat). Call for new or changing lesions. Destruction of lesion - left  medial brow x 1, left upper lip x 1 Complexity: simple   Destruction method: cryotherapy   Informed consent: discussed and consent obtained   Timeout:  patient name, date of birth, surgical site, and procedure verified Lesion destroyed using liquid nitrogen: Yes   Region frozen until ice ball extended beyond lesion: Yes   Outcome: patient tolerated procedure well with no complications   Post-procedure details: wound care instructions given   Additional details:  Prior to procedure, discussed risks of blister formation, small wound, skin dyspigmentation, or rare scar following cryotherapy. Recommend Vaseline ointment to treated areas while healing.  Viral warts, unspecified type right middle finger x 1 Discussed viral etiology and risk of spread.  Discussed multiple treatments may be required to clear warts.  Discussed possible post-treatment dyspigmentation and risk of recurrence. Destruction of lesion - right middle finger x 1 Complexity: simple   Destruction method: cryotherapy   Informed consent: discussed and consent obtained   Timeout:  patient name, date of birth, surgical site, and procedure verified Lesion destroyed using liquid nitrogen: Yes   Region frozen until ice ball extended beyond lesion: Yes   Outcome: patient tolerated procedure well with no complications   Post-procedure details: wound care instructions given   Additional details:  Prior to procedure, discussed risks of blister formation, small wound, skin dyspigmentation, or rare scar following cryotherapy. Recommend Vaseline ointment to treated areas while healing.  Seborrheic Keratoses - Stuck-on, waxy, tan-brown papules and/or plaques  - Benign-appearing - Discussed benign etiology and prognosis. -  Observe - Call for any changes  Actinic Damage - chronic, secondary to cumulative UV radiation exposure/sun exposure over time - diffuse scaly erythematous macules with underlying dyspigmentation - Recommend daily  broad spectrum sunscreen SPF 30+ to sun-exposed areas, reapply every 2 hours as needed.  - Recommend staying in the shade or wearing long sleeves, sun glasses (UVA+UVB protection) and wide brim hats (4-inch brim around the entire circumference of the hat). - Call for new or changing lesions.  Return in about 1 year (around 06/17/2023) for TBSE hx aks hx of wart . IRuthell Rummage, CMA, am acting as scribe for Sarina Ser, MD. Documentation: I have reviewed the above documentation for accuracy and completeness, and I agree with the above.  Sarina Ser, MD

## 2022-06-16 NOTE — Patient Instructions (Addendum)
Actinic keratoses are precancerous spots that appear secondary to cumulative UV radiation exposure/sun exposure over time. They are chronic with expected duration over 1 year. A portion of actinic keratoses will progress to squamous cell carcinoma of the skin. It is not possible to reliably predict which spots will progress to skin cancer and so treatment is recommended to prevent development of skin cancer.  Recommend daily broad spectrum sunscreen SPF 30+ to sun-exposed areas, reapply every 2 hours as needed.  Recommend staying in the shade or wearing long sleeves, sun glasses (UVA+UVB protection) and wide brim hats (4-inch brim around the entire circumference of the hat). Call for new or changing lesions.   Cryotherapy Aftercare  Wash gently with soap and water everyday.   Apply Vaseline and Band-Aid daily until healed.   Due to recent changes in healthcare laws, you may see results of your pathology and/or laboratory studies on MyChart before the doctors have had a chance to review them. We understand that in some cases there may be results that are confusing or concerning to you. Please understand that not all results are received at the same time and often the doctors may need to interpret multiple results in order to provide you with the best plan of care or course of treatment. Therefore, we ask that you please give [place] 2 business days to thoroughly review all your results before contacting the office for clarification. Should we see a critical lab result, you will be contacted sooner.   If You Need Anything After Your Visit  If you have any questions or concerns for your doctor, please call our main line at [phone] and press option 4 to reach your doctor's medical assistant. If no one answers, please leave a voicemail as directed and we will return your call as soon as possible. Messages left after 4 pm will be answered the following business day.   You may also send [place] a message via  [place]. We typically respond to MyChart messages within 1-2 business days.  For prescription refills, please ask your pharmacy to contact our office. Our fax number is [phone].  If you have an urgent issue when the clinic is closed that cannot wait until the next business day, you can page your doctor at the number below.    Please note that while we do our best to be available for urgent issues outside of office hours, we are not available 24/7.   If you have an urgent issue and are unable to reach [place], you may choose to seek medical care at your doctor's office, retail clinic, urgent care center, or emergency room.  If you have a medical emergency, please immediately call 911 or go to the emergency department.  Pager Numbers  - Dr. Nehemiah Massed: [phone]  - Dr. Laurence Ferrari: [phone]  - Dr. Nicole Kindred: [phone]  In the event of inclement weather, please call our main line at [phone] for an update on the status of any delays or closures.  Dermatology Medication Tips: Please keep the boxes that topical medications come in in order to help keep track of the instructions about where and how to use these. Pharmacies typically print the medication instructions only on the boxes and not directly on the medication tubes.   If your medication is too expensive, please contact our office at [phone] option 4 or send [place] a message through [place].   We are unable to tell what your co-pay for medications will be in advance as this is different  depending on your insurance coverage. However, we may be able to find a substitute medication at lower cost or fill out paperwork to get insurance to cover a needed medication.   If a prior authorization is required to get your medication covered by your insurance company, please allow [place] 1-2 business days to complete this process.  Drug prices often vary depending on where the prescription is filled and some pharmacies may offer cheaper  prices.  The website www.goodrx.com contains coupons for medications through different pharmacies. The prices here do not account for what the cost may be with help from insurance (it may be cheaper with your insurance), but the website can give you the price if you did not use any insurance.  - You can print the associated coupon and take it with your prescription to the pharmacy.  - You may also stop by our office during regular business hours and pick up a GoodRx coupon card.  - If you need your prescription sent electronically to a different pharmacy, notify our office through [facility] or by phone at [phone] option 4.     Si Usted Necesita Algo Despus de Su Visita  Tambin puede enviarnos un mensaje a travs de Pharmacist, community. Por lo general respondemos a los mensajes de MyChart en el transcurso de 1 a 2 das hbiles.  Para renovar recetas, por favor pida a su farmacia que se ponga en contacto con nuestra oficina. Harland Dingwall de fax es [place] [phone].  Si tiene un asunto urgente cuando la clnica est cerrada y que no puede esperar hasta el siguiente da hbil, puede llamar/localizar a su doctor(a) al nmero que aparece a continuacin.   Por favor, tenga en cuenta que aunque hacemos todo lo posible para estar disponibles para asuntos urgentes fuera del horario de [place], no estamos disponibles las 24 horas del da, los 7 das de la [place].   Si tiene un problema urgente y no puede comunicarse con nosotros, puede optar por buscar atencin mdica  en el consultorio de su doctor(a), en una clnica privada, en un centro de atencin urgente o en una sala de emergencias.  Si tiene Engineering geologist, por favor llame inmediatamente al 911 o vaya a la sala de emergencias.  Nmeros de bper  - Dr. Nehemiah Massed: [phone]  - Dra. Moye: [phone]  - Dra. Nicole Kindred: [phone]  En caso de inclemencias del [place], por favor llame a Johnsie Kindred principal al [phone]  para una actualizacin sobre el [place] de cualquier retraso o cierre.  Consejos para la medicacin en dermatologa: Por favor, guarde las cajas en las que vienen los medicamentos de uso tpico para ayudarle a seguir las instrucciones sobre dnde y cmo usarlos. Las farmacias generalmente imprimen las instrucciones del medicamento slo en las cajas y no directamente en los tubos del [place].   Si su medicamento es muy caro, por favor, pngase en contacto con Zigmund Daniel llamando al [phone] y presione la opcin 4 o envenos un mensaje a travs de Pharmacist, community.   No podemos decirle cul ser su copago por los medicamentos por adelantado ya que esto es diferente dependiendo de la cobertura de su seguro. Sin embargo, es posible que podamos encontrar un medicamento sustituto a Electrical engineer un formulario para que el seguro cubra el medicamento que se considera necesario.   Si se requiere una autorizacin previa para que su compaa de seguros [place] su medicamento, por favor permtanos de 1 a 2 das hbiles para [employer]  proceso.  Los precios de los medicamentos varan con frecuencia dependiendo del Environmental consultant de dnde se surte la receta y alguna farmacias pueden ofrecer precios ms baratos.  El sitio web www.goodrx.com tiene cupones para medicamentos de Airline pilot. Los precios aqu no tienen en cuenta lo que podra costar con la ayuda del seguro (puede ser ms barato con su seguro), pero el sitio web puede darle el precio si no utiliz Research scientist (physical sciences).  - Puede imprimir el cupn correspondiente y llevarlo con su receta a la farmacia.  - Tambin puede pasar por nuestra oficina durante el horario de atencin regular y Charity fundraiser una tarjeta de cupones de GoodRx.  - Si necesita que su receta se enve electrnicamente a una farmacia diferente, informe a nuestra oficina a travs de MyChart de [place] o por telfono llamando al [phone] y presione la opcin 4.

## 2022-06-18 ENCOUNTER — Ambulatory Visit: Payer: Medicare HMO | Admitting: Family Medicine

## 2022-06-20 ENCOUNTER — Telehealth: Payer: Self-pay | Admitting: Family Medicine

## 2022-06-20 ENCOUNTER — Other Ambulatory Visit: Payer: Self-pay

## 2022-06-20 MED ORDER — HYDRALAZINE HCL 10 MG PO TABS: 10.0000 mg | ORAL_TABLET | Freq: Three times a day (TID) | ORAL | 4 refills | Status: DC

## 2022-06-20 NOTE — Telephone Encounter (Signed)
Patient needs refill on   hydrALAZINE (APRESOLINE) 10 MG tablet    Patient is out of med wants to go to  [place].

## 2022-06-23 ENCOUNTER — Telehealth: Payer: Self-pay | Admitting: Family Medicine

## 2022-06-23 NOTE — Telephone Encounter (Signed)
Pt called stating that she is needing a new referral for GI. She was going to the [facility] but is wanting to come to one in [place] that is closer. Can you please send a referral letting the new GI know she no longer will be going to [facility]?

## 2022-06-24 ENCOUNTER — Other Ambulatory Visit: Payer: Self-pay

## 2022-06-24 ENCOUNTER — Encounter: Payer: Self-pay | Admitting: Family Medicine

## 2022-06-24 ENCOUNTER — Other Ambulatory Visit: Payer: Self-pay | Admitting: *Deleted

## 2022-06-24 DIAGNOSIS — K219 Gastro-esophageal reflux disease without esophagitis: Secondary | ICD-10-CM

## 2022-06-24 MED ORDER — HYDRALAZINE HCL 10 MG PO TABS: 10.0000 mg | ORAL_TABLET | Freq: Three times a day (TID) | ORAL | 4 refills | Status: DC

## 2022-06-24 NOTE — Telephone Encounter (Signed)
Referral entered  

## 2022-06-25 ENCOUNTER — Encounter (INDEPENDENT_AMBULATORY_CARE_PROVIDER_SITE_OTHER): Payer: Self-pay | Admitting: *Deleted

## 2022-07-03 DIAGNOSIS — Z79899 Other long term (current) drug therapy: Secondary | ICD-10-CM | POA: Diagnosis not present

## 2022-07-03 DIAGNOSIS — R030 Elevated blood-pressure reading, without diagnosis of hypertension: Secondary | ICD-10-CM | POA: Diagnosis not present

## 2022-07-03 DIAGNOSIS — G8929 Other chronic pain: Secondary | ICD-10-CM | POA: Diagnosis not present

## 2022-07-03 DIAGNOSIS — M7918 Myalgia, other site: Secondary | ICD-10-CM | POA: Diagnosis not present

## 2022-07-03 DIAGNOSIS — I482 Chronic atrial fibrillation, unspecified: Secondary | ICD-10-CM | POA: Diagnosis not present

## 2022-07-03 DIAGNOSIS — Z0130 Encounter for examination of blood pressure without abnormal findings: Secondary | ICD-10-CM | POA: Diagnosis not present

## 2022-07-03 DIAGNOSIS — M19071 Primary osteoarthritis, right ankle and foot: Secondary | ICD-10-CM | POA: Diagnosis not present

## 2022-07-03 DIAGNOSIS — I10 Essential (primary) hypertension: Secondary | ICD-10-CM | POA: Diagnosis not present

## 2022-07-03 DIAGNOSIS — K5792 Diverticulitis of intestine, part unspecified, without perforation or abscess without bleeding: Secondary | ICD-10-CM | POA: Diagnosis not present

## 2022-07-03 DIAGNOSIS — M25571 Pain in right ankle and joints of right foot: Secondary | ICD-10-CM | POA: Diagnosis not present

## 2022-08-01 DIAGNOSIS — J438 Other emphysema: Secondary | ICD-10-CM | POA: Diagnosis not present

## 2022-08-01 DIAGNOSIS — M7918 Myalgia, other site: Secondary | ICD-10-CM | POA: Diagnosis not present

## 2022-08-01 DIAGNOSIS — M19071 Primary osteoarthritis, right ankle and foot: Secondary | ICD-10-CM | POA: Diagnosis not present

## 2022-08-01 DIAGNOSIS — M25571 Pain in right ankle and joints of right foot: Secondary | ICD-10-CM | POA: Diagnosis not present

## 2022-08-01 DIAGNOSIS — Z79899 Other long term (current) drug therapy: Secondary | ICD-10-CM | POA: Diagnosis not present

## 2022-08-01 DIAGNOSIS — K5792 Diverticulitis of intestine, part unspecified, without perforation or abscess without bleeding: Secondary | ICD-10-CM | POA: Diagnosis not present

## 2022-08-01 DIAGNOSIS — I482 Chronic atrial fibrillation, unspecified: Secondary | ICD-10-CM | POA: Diagnosis not present

## 2022-08-01 DIAGNOSIS — R030 Elevated blood-pressure reading, without diagnosis of hypertension: Secondary | ICD-10-CM | POA: Diagnosis not present

## 2022-08-01 DIAGNOSIS — G8929 Other chronic pain: Secondary | ICD-10-CM | POA: Diagnosis not present

## 2022-08-07 DIAGNOSIS — Z961 Presence of intraocular lens: Secondary | ICD-10-CM | POA: Diagnosis not present

## 2022-08-07 DIAGNOSIS — H04123 Dry eye syndrome of bilateral lacrimal glands: Secondary | ICD-10-CM | POA: Diagnosis not present

## 2022-08-07 DIAGNOSIS — H5213 Myopia, bilateral: Secondary | ICD-10-CM | POA: Diagnosis not present

## 2022-08-07 DIAGNOSIS — H43812 Vitreous degeneration, left eye: Secondary | ICD-10-CM | POA: Diagnosis not present

## 2022-08-07 DIAGNOSIS — H1013 Acute atopic conjunctivitis, bilateral: Secondary | ICD-10-CM | POA: Diagnosis not present

## 2022-08-07 DIAGNOSIS — H0288A Meibomian gland dysfunction right eye, upper and lower eyelids: Secondary | ICD-10-CM | POA: Diagnosis not present

## 2022-08-07 DIAGNOSIS — H52203 Unspecified astigmatism, bilateral: Secondary | ICD-10-CM | POA: Diagnosis not present

## 2022-08-07 DIAGNOSIS — H524 Presbyopia: Secondary | ICD-10-CM | POA: Diagnosis not present

## 2022-08-07 DIAGNOSIS — H0288B Meibomian gland dysfunction left eye, upper and lower eyelids: Secondary | ICD-10-CM | POA: Diagnosis not present

## 2022-08-12 DIAGNOSIS — H5213 Myopia, bilateral: Secondary | ICD-10-CM | POA: Diagnosis not present

## 2022-08-12 DIAGNOSIS — H524 Presbyopia: Secondary | ICD-10-CM | POA: Diagnosis not present

## 2022-08-12 DIAGNOSIS — H52209 Unspecified astigmatism, unspecified eye: Secondary | ICD-10-CM | POA: Diagnosis not present

## 2022-08-15 ENCOUNTER — Encounter: Payer: Self-pay | Admitting: Family Medicine

## 2022-08-15 ENCOUNTER — Ambulatory Visit (INDEPENDENT_AMBULATORY_CARE_PROVIDER_SITE_OTHER): Payer: Medicare HMO | Admitting: Family Medicine

## 2022-08-15 VITALS — BP 138/72 | HR 70 | Ht 63.0 in | Wt 132.0 lb

## 2022-08-15 DIAGNOSIS — R109 Unspecified abdominal pain: Secondary | ICD-10-CM | POA: Diagnosis not present

## 2022-08-15 DIAGNOSIS — Z599 Problem related to housing and economic circumstances, unspecified: Secondary | ICD-10-CM

## 2022-08-15 DIAGNOSIS — I10 Essential (primary) hypertension: Secondary | ICD-10-CM

## 2022-08-15 DIAGNOSIS — R490 Dysphonia: Secondary | ICD-10-CM

## 2022-08-15 DIAGNOSIS — E785 Hyperlipidemia, unspecified: Secondary | ICD-10-CM | POA: Diagnosis not present

## 2022-08-15 DIAGNOSIS — F17200 Nicotine dependence, unspecified, uncomplicated: Secondary | ICD-10-CM | POA: Diagnosis not present

## 2022-08-15 DIAGNOSIS — K219 Gastro-esophageal reflux disease without esophagitis: Secondary | ICD-10-CM

## 2022-08-15 DIAGNOSIS — K5792 Diverticulitis of intestine, part unspecified, without perforation or abscess without bleeding: Secondary | ICD-10-CM | POA: Diagnosis not present

## 2022-08-15 DIAGNOSIS — E041 Nontoxic single thyroid nodule: Secondary | ICD-10-CM

## 2022-08-15 DIAGNOSIS — E559 Vitamin D deficiency, unspecified: Secondary | ICD-10-CM

## 2022-08-15 MED ORDER — AMOXICILLIN-POT CLAVULANATE 875-125 MG PO TABS: 1.0000 | ORAL_TABLET | Freq: Two times a day (BID) | ORAL | 0 refills | Status: DC

## 2022-08-15 MED ORDER — DICYCLOMINE HCL 10 MG PO CAPS: ORAL_CAPSULE | ORAL | 0 refills | Status: DC

## 2022-08-15 MED ORDER — DICYCLOMINE HCL 10 MG PO CAPS: ORAL_CAPSULE | ORAL | 2 refills | Status: DC

## 2022-08-15 NOTE — Patient Instructions (Addendum)
Annual exam in December or January, call if you need me sooner   You will be referred to eNT for hoarseness and thyroid nodule  GI referral will be changed to recurrent diverticulitis as you say no problem with gERD  I will refer to social work re rails for your ramp if possible  Work on quitting smoking please  Lipid, cmp and EGFr, YSH and vit D today if fasting please  Bentyl for abdominal spasm, and augmentin are prescribed  Thanks for choosing [facility], we consider it a privelige to serve you.

## 2022-08-15 NOTE — Progress Notes (Signed)
   [name]     MRN: [id number]      DOB: [birth year]   HPI [name] is here for follow up and re-evaluation of chronic medical conditions, medication management and review of any available recent lab and radiology data.  Preventive health is updated, specifically  Cancer screening and Immunization.   C/o LLQ currently on antibiotic for , reports loose stool up to yesterday, wants GI  eval for diverticulitis stating now that  not having any GERD issues. C/O persistent hoarseness, continues to smoke and has a thyroid nodule C/o abdominal spasm and pain Requests help for building a ramp into her home if possible   ROS Denies recent fever or chills. Denies sinus pressure, nasal congestion, ear pain or sore throat. Denies chest congestion, productive cough or wheezing. Denies chest pains, palpitations and leg swelling Denies dysuria, frequency, hesitancy or incontinence. Chronic  joint pain, swelling and limitation in mobility. Denies headaches, seizures, numbness, or tingling. Chronic  depression, anxiety or insomnia. Denies skin break down or rash.   PE  BP 138/72 (BP Location: Right Arm, Patient Position: Sitting)   Pulse 70   Ht 5\' 3"  (1.6 m)   Wt 132 lb (59.9 kg)   SpO2 95%   BMI 23.38 kg/m   Patient alert and oriented and in no cardiopulmonary distress.  HEENT: No facial asymmetry, EOMI,     Neck supple .  Chest: Clear to auscultation bilaterally.reduced air entry bilaterally  CVS: S1, S2 no murmurs, no S3.Regular rate.  ABD: Soft LLQ superficial tenderness, no guarding or rebound  Ext: No edema  MS: Decreased  ROM spine, shoulders, hips and knees.  Skin: Intact, no ulcerations or rash noted.  Psych: Good eye contact, normal affect. Memory intact not anxious or depressed appearing.  CNS: CN 2-12 intact, power,  normal throughout.no focal deficits noted.   Assessment & Plan  Abdominal spasms bENTYL PRESCRINED, REFER GI  Diverticulitis Reports  recurrent acute episodes in past 3 weeks, antibiotic prescribed and referred to GI for further eval  Essential hypertension Controlled, no change in medication   Hyperlipemia Hyperlipidemia:Low fat diet discussed and encouraged.   Lipid Panel  Lab Results  Component Value Date   CHOL 160 03/11/2022   HDL 62 03/11/2022   LDLCALC 69 03/11/2022   TRIG 174 (H) 03/11/2022   CHOLHDL 2.6 03/11/2022   Needs to try to reduce fried and fatty foods    NICOTINE ADDICTION Asked:confirms currently smokes cigarettes Assess: Unwilling to set a quit date, but is cutting back Advise: needs to QUIT to reduce risk of cancer, cardio and cerebrovascular disease Assist: counseled for 5 minutes and literature provided Arrange: follow up in 2 to 4 months   Hoarseness, persistent Chronic hoarseness and continues to smoke, refer to ENT  Thyroid nodule Nicotine use and thyroid noduel with hoarseness, refer ENT

## 2022-08-18 ENCOUNTER — Encounter: Payer: Self-pay | Admitting: Family Medicine

## 2022-08-18 DIAGNOSIS — R109 Unspecified abdominal pain: Secondary | ICD-10-CM | POA: Insufficient documentation

## 2022-08-18 DIAGNOSIS — E041 Nontoxic single thyroid nodule: Secondary | ICD-10-CM | POA: Insufficient documentation

## 2022-08-18 DIAGNOSIS — K5792 Diverticulitis of intestine, part unspecified, without perforation or abscess without bleeding: Secondary | ICD-10-CM | POA: Insufficient documentation

## 2022-08-18 NOTE — Assessment & Plan Note (Signed)
Controlled, no change in medication  

## 2022-08-18 NOTE — Assessment & Plan Note (Signed)
Reports recurrent acute episodes in past 3 weeks, antibiotic prescribed and referred to GI for further eval

## 2022-08-18 NOTE — Assessment & Plan Note (Signed)
Hyperlipidemia:Low fat diet discussed and encouraged.   Lipid Panel  Lab Results  Component Value Date   CHOL 160 03/11/2022   HDL 62 03/11/2022   LDLCALC 69 03/11/2022   TRIG 174 (H) 03/11/2022   CHOLHDL 2.6 03/11/2022   Needs to try to reduce fried and fatty foods

## 2022-08-18 NOTE — Assessment & Plan Note (Signed)
Nicotine use and thyroid noduel with hoarseness, refer ENT

## 2022-08-18 NOTE — Assessment & Plan Note (Signed)
Chronic hoarseness and continues to smoke, refer to ENT

## 2022-08-18 NOTE — Assessment & Plan Note (Signed)
Asked:confirms currently smokes cigarettes °Assess: Unwilling to set a quit date, but is cutting back °Advise: needs to QUIT to reduce risk of cancer, cardio and cerebrovascular disease °Assist: counseled for 5 minutes and literature provided °Arrange: follow up in 2 to 4 months ° °

## 2022-08-18 NOTE — Assessment & Plan Note (Signed)
bENTYL PRESCRINED, REFER GI

## 2022-08-20 ENCOUNTER — Other Ambulatory Visit (HOSPITAL_COMMUNITY): Payer: Self-pay | Admitting: Family Medicine

## 2022-08-20 DIAGNOSIS — Z1231 Encounter for screening mammogram for malignant neoplasm of breast: Secondary | ICD-10-CM

## 2022-08-25 ENCOUNTER — Encounter (INDEPENDENT_AMBULATORY_CARE_PROVIDER_SITE_OTHER): Payer: Self-pay | Admitting: *Deleted

## 2022-08-27 ENCOUNTER — Telehealth: Payer: Self-pay | Admitting: *Deleted

## 2022-08-27 NOTE — Telephone Encounter (Signed)
   Telephone encounter was:  Unsuccessful.  08/27/2022 Name: [name] MRN: [id number] DOB: March 03, 1951  Unsuccessful outbound call made today to assist with:  Home Modifications  Outreach Attempt:  1st Attempt  A HIPAA compliant voice message was left requesting a return call.  Instructed patient to call back at [phone].  [facility] [phone] [address] , [place] [address] Email : [email or web address]

## 2022-08-29 DIAGNOSIS — M7918 Myalgia, other site: Secondary | ICD-10-CM | POA: Diagnosis not present

## 2022-08-29 DIAGNOSIS — I482 Chronic atrial fibrillation, unspecified: Secondary | ICD-10-CM | POA: Diagnosis not present

## 2022-08-29 DIAGNOSIS — M25571 Pain in right ankle and joints of right foot: Secondary | ICD-10-CM | POA: Diagnosis not present

## 2022-08-29 DIAGNOSIS — K5792 Diverticulitis of intestine, part unspecified, without perforation or abscess without bleeding: Secondary | ICD-10-CM | POA: Diagnosis not present

## 2022-08-29 DIAGNOSIS — R7309 Other abnormal glucose: Secondary | ICD-10-CM | POA: Diagnosis not present

## 2022-08-29 DIAGNOSIS — M19071 Primary osteoarthritis, right ankle and foot: Secondary | ICD-10-CM | POA: Diagnosis not present

## 2022-08-29 DIAGNOSIS — R030 Elevated blood-pressure reading, without diagnosis of hypertension: Secondary | ICD-10-CM | POA: Diagnosis not present

## 2022-08-29 DIAGNOSIS — J438 Other emphysema: Secondary | ICD-10-CM | POA: Diagnosis not present

## 2022-08-29 DIAGNOSIS — Z79899 Other long term (current) drug therapy: Secondary | ICD-10-CM | POA: Diagnosis not present

## 2022-09-03 ENCOUNTER — Telehealth: Payer: Self-pay | Admitting: *Deleted

## 2022-09-03 NOTE — Telephone Encounter (Signed)
   Telephone encounter was:  Successful.  09/03/2022 Name: [name] MRN: [id number] DOB: 1951/11/03  [name] is a 71 y.o. year old female who is a primary care patient of Moshe Cipro, Norwood Levo, MD . The community resource team was consulted for assistance with ramps  Care guide performed the following interventions: Patient provided with information about care guide support team and interviewed to confirm resource needs Follow up call placed to community resources to determine status of patients referral. Provided information on Independent Living , [place] , [employer] home maintenance and repair weatherization  Follow Up Plan:  No further follow up planned at this time. The patient has been provided with needed resources.  [facility] [phone] [address] , [place] [address] Email : [email or web address]

## 2022-09-11 ENCOUNTER — Encounter (INDEPENDENT_AMBULATORY_CARE_PROVIDER_SITE_OTHER): Payer: Self-pay | Admitting: Gastroenterology

## 2022-09-11 ENCOUNTER — Ambulatory Visit (INDEPENDENT_AMBULATORY_CARE_PROVIDER_SITE_OTHER): Payer: Medicare HMO | Admitting: Gastroenterology

## 2022-09-11 ENCOUNTER — Other Ambulatory Visit (INDEPENDENT_AMBULATORY_CARE_PROVIDER_SITE_OTHER): Payer: Self-pay

## 2022-09-11 ENCOUNTER — Encounter (INDEPENDENT_AMBULATORY_CARE_PROVIDER_SITE_OTHER): Payer: Self-pay

## 2022-09-11 ENCOUNTER — Telehealth (INDEPENDENT_AMBULATORY_CARE_PROVIDER_SITE_OTHER): Payer: Self-pay

## 2022-09-11 VITALS — BP 151/78 | HR 69 | Temp 98.2°F | Ht 63.0 in | Wt 130.7 lb

## 2022-09-11 DIAGNOSIS — R1310 Dysphagia, unspecified: Secondary | ICD-10-CM | POA: Diagnosis not present

## 2022-09-11 DIAGNOSIS — R197 Diarrhea, unspecified: Secondary | ICD-10-CM

## 2022-09-11 DIAGNOSIS — R1032 Left lower quadrant pain: Secondary | ICD-10-CM

## 2022-09-11 DIAGNOSIS — K529 Noninfective gastroenteritis and colitis, unspecified: Secondary | ICD-10-CM | POA: Insufficient documentation

## 2022-09-11 DIAGNOSIS — K5792 Diverticulitis of intestine, part unspecified, without perforation or abscess without bleeding: Secondary | ICD-10-CM

## 2022-09-11 DIAGNOSIS — I10 Essential (primary) hypertension: Secondary | ICD-10-CM | POA: Diagnosis not present

## 2022-09-11 DIAGNOSIS — Z8719 Personal history of other diseases of the digestive system: Secondary | ICD-10-CM

## 2022-09-11 DIAGNOSIS — E559 Vitamin D deficiency, unspecified: Secondary | ICD-10-CM | POA: Diagnosis not present

## 2022-09-11 DIAGNOSIS — E785 Hyperlipidemia, unspecified: Secondary | ICD-10-CM | POA: Diagnosis not present

## 2022-09-11 MED ORDER — PEG 3350-KCL-NA BICARB-NACL 420 G PO SOLR: 4000.0000 mL | ORAL | 0 refills | Status: DC
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
## 2022-09-11 NOTE — Patient Instructions (Addendum)
We will get you scheduled for upper endoscopy and colonoscopy  Please try to adhere to a high fiber diet Let me know if you have a flare in your symptoms  Follow up 3 months

## 2022-09-11 NOTE — Telephone Encounter (Signed)
[name] [name], CMA  ?

## 2022-09-12 ENCOUNTER — Encounter (INDEPENDENT_AMBULATORY_CARE_PROVIDER_SITE_OTHER): Payer: Self-pay

## 2022-09-12 LAB — CMP14+EGFR
ALT: 18 IU/L (ref 0–32)
AST: 21 IU/L (ref 0–40)
Albumin/Globulin Ratio: 2.1 (ref 1.2–2.2)
Albumin: 4.6 g/dL (ref 3.8–4.8)
Alkaline Phosphatase: 73 IU/L (ref 44–121)
BUN/Creatinine Ratio: 17 (ref 12–28)
BUN: 11 mg/dL (ref 8–27)
Bilirubin Total: 0.3 mg/dL (ref 0.0–1.2)
CO2: 24 mmol/L (ref 20–29)
Calcium: 9.5 mg/dL (ref 8.7–10.3)
Chloride: 101 mmol/L (ref 96–106)
Creatinine, Ser: 0.63 mg/dL (ref 0.57–1.00)
Globulin, Total: 2.2 g/dL (ref 1.5–4.5)
Glucose: 101 mg/dL — ABNORMAL HIGH (ref 70–99)
Potassium: 4.5 mmol/L (ref 3.5–5.2)
Sodium: 138 mmol/L (ref 134–144)
Total Protein: 6.8 g/dL (ref 6.0–8.5)
eGFR: 94 mL/min/{1.73_m2} (ref >59)

## 2022-09-12 LAB — TSH: TSH: 1.51 u[IU]/mL (ref 0.450–4.500)

## 2022-09-12 LAB — LIPID PANEL
Chol/HDL Ratio: 2.4 ratio (ref 0.0–4.4)
Cholesterol, Total: 158 mg/dL (ref 100–199)
HDL: 65 mg/dL (ref >39)
LDL Chol Calc (NIH): 67 mg/dL (ref 0–99)
Triglycerides: 156 mg/dL — ABNORMAL HIGH (ref 0–149)
VLDL Cholesterol Cal: 26 mg/dL (ref 5–40)

## 2022-09-12 LAB — VITAMIN D 25 HYDROXY (VIT D DEFICIENCY, FRACTURES): Vit D, 25-Hydroxy: 17.7 ng/mL — ABNORMAL LOW (ref 30.0–100.0)

## 2022-09-14 ENCOUNTER — Other Ambulatory Visit: Payer: Self-pay | Admitting: Family Medicine

## 2022-09-14 MED ORDER — ERGOCALCIFEROL 1.25 MG (50000 UT) PO CAPS: 50000.0000 [IU] | ORAL_CAPSULE | ORAL | 2 refills | Status: DC

## 2022-09-14 NOTE — Addendum Note (Signed)
Addended by: Harvel Quale on: 09/14/2022 09:54 PM   Modules accepted: Level of Service

## 2022-09-19 ENCOUNTER — Other Ambulatory Visit: Payer: Self-pay | Admitting: Family Medicine

## 2022-09-24 ENCOUNTER — Telehealth: Payer: Self-pay | Admitting: Family Medicine

## 2022-09-24 ENCOUNTER — Ambulatory Visit (HOSPITAL_COMMUNITY)
Admission: RE | Admit: 2022-09-24 | Discharge: 2022-09-24 | Disposition: A | Discharge status: Home / Self Care | Payer: Medicare HMO | Source: Ambulatory Visit | Attending: Family Medicine | Admitting: Family Medicine

## 2022-09-24 DIAGNOSIS — Z1231 Encounter for screening mammogram for malignant neoplasm of breast: Secondary | ICD-10-CM | POA: Diagnosis not present

## 2022-09-24 NOTE — Telephone Encounter (Signed)
Patient came by the office and to let provider know patient sent out two sets of paper work to two  different dme companies will be receiving a fax to get medical records for the  Repair to handi cap ramp can not get to her back yard to check her water system or generator due to winter weather coming.  And new handi cap with hand rails.  All of this is due from fall risk due to her right side.  Patient will be getting new insurance in January 2024 does not accept her current cardiology doctor will need a new cardiology doctor in [place] [facility].

## 2022-09-29 ENCOUNTER — Other Ambulatory Visit (HOSPITAL_COMMUNITY): Payer: Self-pay | Admitting: Family Medicine

## 2022-09-29 DIAGNOSIS — R928 Other abnormal and inconclusive findings on diagnostic imaging of breast: Secondary | ICD-10-CM

## 2022-10-01 DIAGNOSIS — R7309 Other abnormal glucose: Secondary | ICD-10-CM | POA: Diagnosis not present

## 2022-10-01 DIAGNOSIS — R030 Elevated blood-pressure reading, without diagnosis of hypertension: Secondary | ICD-10-CM | POA: Diagnosis not present

## 2022-10-01 DIAGNOSIS — G8929 Other chronic pain: Secondary | ICD-10-CM | POA: Diagnosis not present

## 2022-10-01 DIAGNOSIS — Z79899 Other long term (current) drug therapy: Secondary | ICD-10-CM | POA: Diagnosis not present

## 2022-10-01 DIAGNOSIS — M7918 Myalgia, other site: Secondary | ICD-10-CM | POA: Diagnosis not present

## 2022-10-01 DIAGNOSIS — M25571 Pain in right ankle and joints of right foot: Secondary | ICD-10-CM | POA: Diagnosis not present

## 2022-10-01 DIAGNOSIS — M19071 Primary osteoarthritis, right ankle and foot: Secondary | ICD-10-CM | POA: Diagnosis not present

## 2022-10-07 ENCOUNTER — Ambulatory Visit (HOSPITAL_COMMUNITY)
Admission: RE | Admit: 2022-10-07 | Discharge: 2022-10-07 | Disposition: A | Discharge status: Home / Self Care | Payer: Medicare HMO | Source: Ambulatory Visit | Attending: Family Medicine | Admitting: Family Medicine

## 2022-10-07 ENCOUNTER — Encounter (HOSPITAL_COMMUNITY): Payer: Self-pay

## 2022-10-07 DIAGNOSIS — R928 Other abnormal and inconclusive findings on diagnostic imaging of breast: Secondary | ICD-10-CM | POA: Insufficient documentation

## 2022-10-07 DIAGNOSIS — R922 Inconclusive mammogram: Secondary | ICD-10-CM | POA: Diagnosis not present

## 2022-10-10 DIAGNOSIS — I70219 Atherosclerosis of native arteries of extremities with intermittent claudication, unspecified extremity: Secondary | ICD-10-CM | POA: Diagnosis not present

## 2022-10-10 DIAGNOSIS — I6523 Occlusion and stenosis of bilateral carotid arteries: Secondary | ICD-10-CM | POA: Diagnosis not present

## 2022-10-10 DIAGNOSIS — I480 Paroxysmal atrial fibrillation: Secondary | ICD-10-CM | POA: Diagnosis not present

## 2022-10-10 DIAGNOSIS — I700 Atherosclerosis of aorta: Secondary | ICD-10-CM | POA: Diagnosis not present

## 2022-10-10 DIAGNOSIS — I471 Supraventricular tachycardia, unspecified: Secondary | ICD-10-CM | POA: Diagnosis not present

## 2022-10-10 DIAGNOSIS — I4719 Other supraventricular tachycardia: Secondary | ICD-10-CM | POA: Diagnosis not present

## 2022-10-10 DIAGNOSIS — E782 Mixed hyperlipidemia: Secondary | ICD-10-CM | POA: Diagnosis not present

## 2022-10-10 DIAGNOSIS — I2510 Atherosclerotic heart disease of native coronary artery without angina pectoris: Secondary | ICD-10-CM | POA: Diagnosis not present

## 2022-10-10 DIAGNOSIS — I493 Ventricular premature depolarization: Secondary | ICD-10-CM | POA: Diagnosis not present

## 2022-10-12 ENCOUNTER — Other Ambulatory Visit: Payer: Self-pay | Admitting: Family Medicine

## 2022-10-13 ENCOUNTER — Encounter: Payer: Self-pay | Admitting: Internal Medicine

## 2022-10-15 ENCOUNTER — Encounter: Payer: Self-pay | Admitting: Family Medicine

## 2022-10-15 NOTE — Telephone Encounter (Signed)
Called and spoke to patient.

## 2022-10-22 ENCOUNTER — Ambulatory Visit (INDEPENDENT_AMBULATORY_CARE_PROVIDER_SITE_OTHER): Payer: Medicare HMO | Admitting: Family Medicine

## 2022-10-22 ENCOUNTER — Encounter: Payer: Self-pay | Admitting: Family Medicine

## 2022-10-22 VITALS — BP 103/65 | HR 74 | Ht 63.0 in | Wt 130.1 lb

## 2022-10-22 DIAGNOSIS — I471 Supraventricular tachycardia, unspecified: Secondary | ICD-10-CM

## 2022-10-22 DIAGNOSIS — Z0001 Encounter for general adult medical examination with abnormal findings: Secondary | ICD-10-CM

## 2022-10-22 DIAGNOSIS — M549 Dorsalgia, unspecified: Secondary | ICD-10-CM | POA: Diagnosis not present

## 2022-10-22 DIAGNOSIS — I6523 Occlusion and stenosis of bilateral carotid arteries: Secondary | ICD-10-CM | POA: Diagnosis not present

## 2022-10-22 DIAGNOSIS — G8929 Other chronic pain: Secondary | ICD-10-CM

## 2022-10-22 DIAGNOSIS — M5410 Radiculopathy, site unspecified: Secondary | ICD-10-CM | POA: Diagnosis not present

## 2022-10-22 DIAGNOSIS — M25511 Pain in right shoulder: Secondary | ICD-10-CM

## 2022-10-22 MED ORDER — VITAMIN D (ERGOCALCIFEROL) 1.25 MG (50000 UNIT) PO CAPS: 50000.0000 [IU] | ORAL_CAPSULE | ORAL | 5 refills | Status: DC

## 2022-10-22 NOTE — Patient Instructions (Addendum)
SISTER CARBONE  10/22/2022     @PREFPERIOPPHARMACY @   Your procedure is scheduled on 10/28/2022.  Report to [facility] at [address] this number if you have problems the morning of surgery:  [phone]  If you experience any cold or flu symptoms such as cough, fever, chills, shortness of breath, etc. between now and your scheduled surgery, please notify us at the above number.   Remember:    Please Follow tueh diet and prep instructions given to you by Dr Villa Herb office.    Take these medicines the morning of surgery with A SIP OF WATER : Zyrtec Flecainide Apresoline and Metoprolol    Do not wear jewelry, make-up or nail polish.  Do not wear lotions, powders, or perfumes, or deodorant.  Do not shave 48 hours prior to surgery.  Men may shave face and neck.  Do not bring valuables to the hospital.  [facility] is not responsible for any belongings or valuables.  Contacts, dentures or bridgework may not be worn into surgery.  Leave your suitcase in the car.  After surgery it may be brought to your room.  For patients admitted to the hospital, discharge time will be determined by your treatment team.  Patients discharged the day of surgery will not be allowed to drive home.   Name and phone number of your driver:   Family Special instructions:  N/A  Please read over the following fact sheets that you were given. Care and Recovery After Surgery  Colonoscopy, Adult A colonoscopy is a procedure to look at the entire large intestine. This procedure is done using a long, thin, flexible tube that has a camera on the end. You may have a colonoscopy: As a part of normal colorectal screening. If you have certain symptoms, such as: A low number of red blood cells in your blood (anemia). Diarrhea that does not go away. Pain in your abdomen. Blood in your stool. A colonoscopy can help screen for and diagnose medical problems, including: An abnormal growth of cells or  tissue (tumor). Abnormal growths within the lining of your intestine (polyps). Inflammation. Areas of bleeding. Tell your health care provider about: Any allergies you have. All medicines you are taking, including vitamins, herbs, eye drops, creams, and over-the-counter medicines. Any problems you or family members have had with anesthetic medicines. Any bleeding problems you have. Any surgeries you have had. Any medical conditions you have. Any problems you have had with having bowel movements. Whether you are pregnant or may be pregnant. What are the risks? Generally, this is a safe procedure. However, problems may occur, including: Bleeding. Damage to your intestine. Allergic reactions to medicines given during the procedure. Infection. This is rare. What happens before the procedure? Eating and drinking restrictions Follow instructions from your health care provider about eating or drinking restrictions, which may include: A few days before the procedure: Follow a low-fiber diet. Avoid nuts, seeds, dried fruit, raw fruits, and vegetables. 1-3 days before the procedure: Eat only gelatin dessert or ice pops. Drink only clear liquids, such as water, clear juice, clear broth or bouillon, black coffee or tea, or clear soft drinks or sports drinks. Avoid liquids that contain red or purple dye. The day of the procedure: Do not eat solid foods. You may continue to drink clear liquids until up to 2 hours before the procedure. Do not eat or drink anything starting 2 hours before the procedure, or within the time period that  your health care provider recommends. Bowel prep If you were prescribed a bowel prep to take by mouth (orally) to clean out your colon: Take it as told by your health care provider. Starting the day before your procedure, you will need to drink a large amount of liquid medicine. The liquid will cause you to have many bowel movements of loose stool until your stool  becomes almost clear or light green. If your skin or the opening between the buttocks (anus) gets irritated from diarrhea, you may relieve the irritation using: Wipes with medicine in them, such as adult wet wipes with aloe and vitamin E. A product to soothe skin, such as petroleum jelly. If you vomit while drinking the bowel prep: Take a break for up to 60 minutes. Begin the bowel prep again. Call your health care provider if you keep vomiting or you cannot take the bowel prep without vomiting. To clean out your colon, you may also be given: Laxative medicines. These help you have a bowel movement. Instructions for enema use. An enema is liquid medicine injected into your rectum. Medicines Ask your health care provider about: Changing or stopping your regular medicines or supplements. This is especially important if you are taking iron supplements, diabetes medicines, or blood thinners. Taking medicines such as aspirin and ibuprofen. These medicines can thin your blood. Do not take these medicines unless your health care provider tells you to take them. Taking over-the-counter medicines, vitamins, herbs, and supplements. General instructions Ask your health care provider what steps will be taken to help prevent infection. These may include washing skin with a germ-killing soap. If you will be going home right after the procedure, plan to have a responsible adult: Take you home from the hospital or clinic. You will not be allowed to drive. Care for you for the time you are told. What happens during the procedure?  An IV will be inserted into one of your veins. You will be given a medicine to make you fall asleep (general anesthetic). You will lie on your side with your knees bent. A lubricant will be put on the tube. Then the tube will be: Inserted into your anus. Gently eased through all parts of your large intestine. Air will be sent into your colon to keep it open. This may cause some  pressure or cramping. Images will be taken with the camera and will appear on a screen. A small tissue sample may be removed to be looked at under a microscope (biopsy). The tissue may be sent to a lab for testing if any signs of problems are found. If small polyps are found, they may be removed and checked for cancer cells. When the procedure is finished, the tube will be removed. The procedure may vary among health care providers and hospitals. What happens after the procedure? Your blood pressure, heart rate, breathing rate, and blood oxygen level will be monitored until you leave the hospital or clinic. You may have a small amount of blood in your stool. You may pass gas and have mild cramping or bloating in your abdomen. This is caused by the air that was used to open your colon during the exam. If you were given a sedative during the procedure, it can affect you for several hours. Do not drive or operate machinery until your health care provider says that it is safe. It is up to you to get the results of your procedure. Ask your health care provider, or the department that  is doing the procedure, when your results will be ready. Summary A colonoscopy is a procedure to look at the entire large intestine. Follow instructions from your health care provider about eating and drinking before the procedure. If you were prescribed an oral bowel prep to clean out your colon, take it as told by your health care provider. During the colonoscopy, a flexible tube with a camera on its end is inserted into the anus and then passed into all parts of the large intestine. This information is not intended to replace advice given to you by your health care provider. Make sure you discuss any questions you have with your health care provider. Document Revised: 10/21/2021 Document Reviewed: 06/19/2021 Elsevier Patient Education  [place] Endoscopy, Adult Upper endoscopy is a procedure to look  inside the upper GI (gastrointestinal) tract. The upper GI tract is made up of: The esophagus. This is the part of the body that moves food from your mouth to your stomach. The stomach. The duodenum. This is the first part of your small intestine. This procedure is also called esophagogastroduodenoscopy (EGD) or gastroscopy. In this procedure, your health care provider passes a thin, flexible tube (endoscope) through your mouth and down your esophagus into your stomach and into your duodenum. A small camera is attached to the end of the tube. Images from the camera appear on a monitor in the exam room. During this procedure, your health care provider may also remove a small piece of tissue to be sent to a lab and examined under a microscope (biopsy). Your health care provider may do an upper endoscopy to diagnose cancers of the upper GI tract. You may also have this procedure to find the cause of other conditions, such as: Stomach pain. Heartburn. Pain or problems when swallowing. Nausea and vomiting. Stomach bleeding. Stomach ulcers. Tell a health care provider about: Any allergies you have. All medicines you are taking, including vitamins, herbs, eye drops, creams, and over-the-counter medicines. Any problems you or family members have had with anesthetic medicines. Any bleeding problems you have. Any surgeries you have had. Any medical conditions you have. Whether you are pregnant or may be pregnant. What are the risks? Your healthcare provider will talk with you about risks. These may include: Infection. Bleeding. Allergic reactions to medicines. A tear or hole (perforation) in the esophagus, stomach, or duodenum. What happens before the procedure? When to stop eating and drinking Follow instructions from your health care provider about what you may eat and drink. These may include: 8 hours before your procedure Stop eating most foods. Do not eat meat, fried foods, or fatty  foods. Eat only light foods, such as toast or crackers. All liquids are okay except energy drinks and alcohol. 6 hours before your procedure Stop eating. Drink only clear liquids, such as water, clear fruit juice, black coffee, plain tea, and sports drinks. Do not drink energy drinks or alcohol. 2 hours before your procedure Stop drinking all liquids. You may be allowed to take medicines with small sips of water. If you do not follow your health care provider's instructions, your procedure may be delayed or canceled. Medicines Ask your health care provider about: Changing or stopping your regular medicines. This is especially important if you are taking diabetes medicines or blood thinners. Taking medicines such as aspirin and ibuprofen. These medicines can thin your blood. Do not take these medicines unless your health care provider tells you to take them. Taking over-the-counter medicines,  vitamins, herbs, and supplements. General instructions If you will be going home right after the procedure, plan to have a responsible adult: Take you home from the hospital or clinic. You will not be allowed to drive. Care for you for the time you are told. What happens during the procedure?  An IV will be inserted into one of your veins. You may be given one or more of the following: A medicine to help you relax (sedative). A medicine to numb the throat (local anesthetic). You will lie on your left side on an exam table. Your health care provider will pass the endoscope through your mouth and down your esophagus. Your health care provider will use the scope to check the inside of your esophagus, stomach, and duodenum. Biopsies may be taken. The endoscope will be removed. The procedure may vary among health care providers and hospitals. What happens after the procedure? Your blood pressure, heart rate, breathing rate, and blood oxygen level will be monitored until you leave the hospital or  clinic. When your throat is no longer numb, you may be given some fluids to drink. If you were given a sedative during the procedure, it can affect you for several hours. Do not drive or operate machinery until your health care provider says that it is safe. It is up to you to get the results of your procedure. Ask your health care provider, or the department that is doing the procedure, when your results will be ready. Contact a health care provider if you: Have a sore throat that lasts longer than 1 day. Have a fever. Get help right away if you: Vomit blood or your vomit looks like coffee grounds. Have bloody, black, or tarry stools. Have a very bad sore throat or you cannot swallow. Have difficulty breathing or very bad pain in your chest or abdomen. These symptoms may be an emergency. Get help right away. Call 911. Do not wait to see if the symptoms will go away. Do not drive yourself to the hospital. Summary Upper endoscopy is a procedure to look inside the upper GI tract. During the procedure, an IV will be inserted into one of your veins. You may be given a medicine to help you relax. The endoscope will be passed through your mouth and down your esophagus. Follow instructions from your health care provider about what you can eat and drink. This information is not intended to replace advice given to you by your health care provider. Make sure you discuss any questions you have with your health care provider. Document Revised: 02/05/2022 Document Reviewed: 02/05/2022 Elsevier Patient Education  [place] Anesthesia refers to the techniques, procedures, and medicines that help a person stay safe and comfortable during surgery. Monitored anesthesia care, or sedation, is one type of anesthesia. You may have sedation if you do not need to be asleep for your procedure. Procedures that use sedation may include: Surgery to remove cataracts from your  eyes. A dental procedure. A biopsy. This is when a tissue sample is removed and looked at under a microscope. You will be watched closely during your procedure. Your level of sedation or type of anesthesia may be changed to fit your needs. Tell a health care provider about: Any allergies you have. All medicines you are taking, including vitamins, herbs, eye drops, creams, and over-the-counter medicines. Any problems you or family members have had with anesthesia. Any bleeding problems you have. Any surgeries you have had. Any  medical conditions or illnesses you have. This includes sleep apnea, cough, fever, or the flu. Whether you are pregnant or may be pregnant. Whether you use cigarettes, alcohol, or drugs. Any use of steroids, whether by mouth or as a cream. What are the risks? Your health care provider will talk with you about risks. These may include: Getting too much medicine (oversedation). Nausea. Allergic reactions to medicines. Trouble breathing. If this happens, a breathing tube may be used to help you breathe. It will be removed when you are awake and breathing on your own. Heart trouble. Lung trouble. Confusion that gets better with time (emergence delirium). What happens before the procedure? When to stop eating and drinking Follow instructions from your health care provider about what you may eat and drink. These may include: 8 hours before your procedure Stop eating most foods. Do not eat meat, fried foods, or fatty foods. Eat only light foods, such as toast or crackers. All liquids are okay except energy drinks and alcohol. 6 hours before your procedure Stop eating. Drink only clear liquids, such as water, clear fruit juice, black coffee, plain tea, and sports drinks. Do not drink energy drinks or alcohol. 2 hours before your procedure Stop drinking all liquids. You may be allowed to take medicines with small sips of water. If you do not follow your health care  provider's instructions, your procedure may be delayed or canceled. Medicines Ask your health care provider about: Changing or stopping your regular medicines. These include any diabetes medicines or blood thinners you take. Taking medicines such as aspirin and ibuprofen. These medicines can thin your blood. Do not take them unless your health care provider tells you to. Taking over-the-counter medicines, vitamins, herbs, and supplements. Testing You may have an exam or testing. You may have a blood or urine sample taken. General instructions Do not use any products that contain nicotine or tobacco for at least 4 weeks before the procedure. These products include cigarettes, chewing tobacco, and vaping devices, such as e-cigarettes. If you need help quitting, ask your health care provider. If you will be going home right after the procedure, plan to have a responsible adult: Take you home from the hospital or clinic. You will not be allowed to drive. Care for you for the time you are told. What happens during the procedure?  Your blood pressure, heart rate, breathing, level of pain, and blood oxygen level will be monitored. An IV will be inserted into one of your veins. You may be given: A sedative. This helps you relax. Anesthesia. This will: Numb certain areas of your body. Make you fall asleep for surgery. You will be given medicines as needed to keep you comfortable. The more medicine you are given, the deeper your level of sedation will be. Your level of sedation may be changed to fit your needs. There are three levels of sedation: Mild sedation. At this level, you may feel awake and relaxed. You will be able to follow directions. Moderate sedation. At this level, you will be sleepy. You may not remember the procedure. Deep sedation. At this level, you will be asleep. You will not remember the procedure. How you get the medicines will depend on your age and the procedure. They may be  given as: A pill. This may be taken by mouth (orally) or inserted into the rectum. An injection. This may be into a vein or muscle. A spray through the nose. After your procedure is over, the medicine will be  stopped. The procedure may vary among health care providers and hospitals. What happens after the procedure? Your blood pressure, heart rate, breathing rate, and blood oxygen level will be monitored until you leave the hospital or clinic. You may feel sleepy, clumsy, or nauseous. You may not remember what happened during or after the procedure. Sedation can affect you for several hours. Do not drive or use machinery until your health care provider says that it is safe. This information is not intended to replace advice given to you by your health care provider. Make sure you discuss any questions you have with your health care provider. Document Revised: 03/24/2022 Document Reviewed: 03/24/2022 Elsevier Patient Education  [place].

## 2022-10-22 NOTE — Patient Instructions (Signed)
F/u in 4 months, call if you need me sooner  Start once weekly vi D , and take one vit D gummy daily please  You are referred for MRI of your low back and to see Neurosurgeon  You are referred to Orthopedics re right shoulder  You are referred to vascular surgeon re blockage in right artery  You are referred to Cardiology in [place]  I hope that your dog responds to t "Treatment" and that you get some help with back and right leg as well AS RIGHT SHOULDER  Continue to reduce cigarettes  I hope that you get help with railing for ramp, bathtub and roof sooner rather than later!  Thanks for choosing [facility], we consider it a privelige to serve you.  Best for 2024

## 2022-10-23 ENCOUNTER — Other Ambulatory Visit: Payer: Self-pay

## 2022-10-23 ENCOUNTER — Encounter (HOSPITAL_COMMUNITY)
Admission: RE | Admit: 2022-10-23 | Discharge: 2022-10-23 | Disposition: A | Discharge status: Home / Self Care | Payer: Medicare HMO | Source: Ambulatory Visit | Attending: Gastroenterology | Admitting: Gastroenterology

## 2022-10-23 VITALS — BP 103/65 | HR 74 | Temp 97.6°F | Resp 18 | Ht 63.0 in | Wt 130.1 lb

## 2022-10-23 DIAGNOSIS — Z01812 Encounter for preprocedural laboratory examination: Secondary | ICD-10-CM | POA: Diagnosis not present

## 2022-10-23 DIAGNOSIS — T502X5A Adverse effect of carbonic-anhydrase inhibitors, benzothiadiazides and other diuretics, initial encounter: Secondary | ICD-10-CM | POA: Insufficient documentation

## 2022-10-23 DIAGNOSIS — K759 Inflammatory liver disease, unspecified: Secondary | ICD-10-CM | POA: Insufficient documentation

## 2022-10-23 LAB — COMPREHENSIVE METABOLIC PANEL
ALT: 23 U/L (ref 0–44)
AST: 25 U/L (ref 15–41)
Albumin: 4.1 g/dL (ref 3.5–5.0)
Alkaline Phosphatase: 58 U/L (ref 38–126)
Anion gap: 9 (ref 5–15)
BUN: 13 mg/dL (ref 8–23)
CO2: 27 mmol/L (ref 22–32)
Calcium: 9.1 mg/dL (ref 8.9–10.3)
Chloride: 103 mmol/L (ref 98–111)
Creatinine, Ser: 0.54 mg/dL (ref 0.44–1.00)
GFR, Estimated: >60 mL/min (ref >60)
Glucose, Bld: 89 mg/dL (ref 70–99)
Potassium: 3.8 mmol/L (ref 3.5–5.1)
Sodium: 139 mmol/L (ref 135–145)
Total Bilirubin: 0.3 mg/dL (ref 0.3–1.2)
Total Protein: 7.1 g/dL (ref 6.5–8.1)

## 2022-10-24 DIAGNOSIS — M19071 Primary osteoarthritis, right ankle and foot: Secondary | ICD-10-CM | POA: Diagnosis not present

## 2022-10-24 DIAGNOSIS — M25571 Pain in right ankle and joints of right foot: Secondary | ICD-10-CM | POA: Diagnosis not present

## 2022-10-24 DIAGNOSIS — R030 Elevated blood-pressure reading, without diagnosis of hypertension: Secondary | ICD-10-CM | POA: Diagnosis not present

## 2022-10-24 DIAGNOSIS — G8929 Other chronic pain: Secondary | ICD-10-CM | POA: Diagnosis not present

## 2022-10-24 DIAGNOSIS — M7918 Myalgia, other site: Secondary | ICD-10-CM | POA: Diagnosis not present

## 2022-10-24 DIAGNOSIS — R7309 Other abnormal glucose: Secondary | ICD-10-CM | POA: Diagnosis not present

## 2022-10-25 ENCOUNTER — Encounter: Payer: Self-pay | Admitting: Family Medicine

## 2022-10-25 DIAGNOSIS — Z0001 Encounter for general adult medical examination with abnormal findings: Secondary | ICD-10-CM | POA: Insufficient documentation

## 2022-10-25 DIAGNOSIS — M5410 Radiculopathy, site unspecified: Secondary | ICD-10-CM | POA: Insufficient documentation

## 2022-10-25 NOTE — Assessment & Plan Note (Signed)
Rate controlled, needs to establish with new Provider as current is retiring, refer to Card in [place]

## 2022-10-25 NOTE — Assessment & Plan Note (Signed)
Chronic and increased LBP with weakness and numbness of RLE , update MRI and neurosurg to eval after report available

## 2022-10-25 NOTE — Assessment & Plan Note (Signed)
Annual exam as documented. . Immunization and cancer screening needs are specifically addressed at this visit.  

## 2022-10-25 NOTE — Assessment & Plan Note (Signed)
Worsening with limited mobility, refer to ortho

## 2022-10-25 NOTE — Assessment & Plan Note (Signed)
Needs f/u with vascular , will refer

## 2022-10-25 NOTE — Progress Notes (Signed)
    [name]     MRN: [id number]      DOB: November 07, 1951  HPI: Patient is in for annual physical exam. C/o increased right shoulder pain with reduced mobility C/o increased low back pain radiaiting down right leg , which is increasingly weak and c/o poor sensatiuon in right thigh Needs f/u with vascular surgery Nees new Cardilogist as current one is retiring  PE: BP 103/65 (BP Location: Right Arm, Patient Position: Sitting, Cuff Size: Normal)   Pulse 74   Ht 5\' 3"  (1.6 m)   Wt 130 lb 1.3 oz (59 kg)   SpO2 90%   BMI 23.04 kg/m   Pleasant  female, alert and oriented x 3, in no cardio-pulmonary distress. Afebrile. HEENT No facial trauma or asymetry. Sinuses non tender.  Extra occullar muscles intact.. External ears normal, . Neck: supple, no adenopathy,JVD or thyromegaly. Chest:  Reduced air entry bilaterally.   Cardiovascular system; Heart sounds normal,  S1 and  S2 ,no S3.  No murmur, or thrill.  Peripheral pulses normal.    Musculoskeletal exam: Decreased  ROM of lumbar spine, right  shoulder and adequate in  knees. Right thigh muscles wasted  Neurologic: Cranial nerves 2 to 12 intact. Decreased sensation in right thigh  disturbance in gait. No tremor.  Skin: Intact, no ulceration, erythema , scaling or rash noted. Pigmentation normal throughout  Psych; Normal mood and affect. Judgement and concentration normal   Assessment & Plan:  Annual visit for general adult medical examination with abnormal findings Annual exam as documented.  Immunization and cancer screening needs are specifically addressed at this visit.   Shoulder pain, right Worsening with limited mobility, refer to ortho  Bilateral carotid artery stenosis Needs f/u with vascular , will refer  Supraventricular tachycardia ([place]) Rate controlled, needs to establish with new Provider as current is retiring, refer to Card in [place]  Back pain with radiation Chronic and increased LBP  with weakness and numbness of RLE , update MRI and neurosurg to eval after report available

## 2022-10-27 ENCOUNTER — Encounter: Payer: Self-pay | Admitting: Orthopedic Surgery

## 2022-10-28 ENCOUNTER — Ambulatory Visit (INDEPENDENT_AMBULATORY_CARE_PROVIDER_SITE_OTHER): Payer: Medicare HMO | Admitting: Gastroenterology

## 2022-10-28 ENCOUNTER — Encounter (HOSPITAL_COMMUNITY)
Admission: RE | Disposition: A | Discharge status: Home / Self Care | Payer: Self-pay | Source: Ambulatory Visit | Attending: Gastroenterology

## 2022-10-28 ENCOUNTER — Ambulatory Visit (HOSPITAL_COMMUNITY): Payer: Medicare HMO | Admitting: Anesthesiology

## 2022-10-28 ENCOUNTER — Encounter (HOSPITAL_COMMUNITY): Payer: Self-pay | Admitting: Gastroenterology

## 2022-10-28 ENCOUNTER — Ambulatory Visit (HOSPITAL_BASED_OUTPATIENT_CLINIC_OR_DEPARTMENT_OTHER): Payer: Medicare HMO | Admitting: Anesthesiology

## 2022-10-28 ENCOUNTER — Ambulatory Visit (HOSPITAL_COMMUNITY)
Admission: RE | Admit: 2022-10-28 | Discharge: 2022-10-28 | Disposition: A | Discharge status: Home / Self Care | Payer: Medicare HMO | Source: Ambulatory Visit | Attending: Gastroenterology | Admitting: Gastroenterology

## 2022-10-28 DIAGNOSIS — D123 Benign neoplasm of transverse colon: Secondary | ICD-10-CM

## 2022-10-28 DIAGNOSIS — Z8619 Personal history of other infectious and parasitic diseases: Secondary | ICD-10-CM | POA: Insufficient documentation

## 2022-10-28 DIAGNOSIS — D122 Benign neoplasm of ascending colon: Secondary | ICD-10-CM | POA: Insufficient documentation

## 2022-10-28 DIAGNOSIS — K259 Gastric ulcer, unspecified as acute or chronic, without hemorrhage or perforation: Secondary | ICD-10-CM

## 2022-10-28 DIAGNOSIS — K5792 Diverticulitis of intestine, part unspecified, without perforation or abscess without bleeding: Secondary | ICD-10-CM

## 2022-10-28 DIAGNOSIS — D125 Benign neoplasm of sigmoid colon: Secondary | ICD-10-CM | POA: Diagnosis not present

## 2022-10-28 DIAGNOSIS — F17200 Nicotine dependence, unspecified, uncomplicated: Secondary | ICD-10-CM | POA: Diagnosis not present

## 2022-10-28 DIAGNOSIS — K635 Polyp of colon: Secondary | ICD-10-CM

## 2022-10-28 DIAGNOSIS — J449 Chronic obstructive pulmonary disease, unspecified: Secondary | ICD-10-CM | POA: Insufficient documentation

## 2022-10-28 DIAGNOSIS — Z9889 Other specified postprocedural states: Secondary | ICD-10-CM | POA: Diagnosis not present

## 2022-10-28 DIAGNOSIS — K31A11 Gastric intestinal metaplasia without dysplasia, involving the antrum: Secondary | ICD-10-CM | POA: Insufficient documentation

## 2022-10-28 DIAGNOSIS — Z85118 Personal history of other malignant neoplasm of bronchus and lung: Secondary | ICD-10-CM | POA: Insufficient documentation

## 2022-10-28 DIAGNOSIS — I2510 Atherosclerotic heart disease of native coronary artery without angina pectoris: Secondary | ICD-10-CM | POA: Insufficient documentation

## 2022-10-28 DIAGNOSIS — G4730 Sleep apnea, unspecified: Secondary | ICD-10-CM | POA: Insufficient documentation

## 2022-10-28 DIAGNOSIS — K2289 Other specified disease of esophagus: Secondary | ICD-10-CM | POA: Diagnosis not present

## 2022-10-28 DIAGNOSIS — K5730 Diverticulosis of large intestine without perforation or abscess without bleeding: Secondary | ICD-10-CM

## 2022-10-28 DIAGNOSIS — R1310 Dysphagia, unspecified: Secondary | ICD-10-CM | POA: Diagnosis not present

## 2022-10-28 DIAGNOSIS — R1030 Lower abdominal pain, unspecified: Secondary | ICD-10-CM | POA: Diagnosis not present

## 2022-10-28 DIAGNOSIS — K319 Disease of stomach and duodenum, unspecified: Secondary | ICD-10-CM | POA: Diagnosis not present

## 2022-10-28 HISTORY — PX: COLONOSCOPY WITH PROPOFOL: SHX5780

## 2022-10-28 HISTORY — PX: ESOPHAGOGASTRODUODENOSCOPY (EGD) WITH PROPOFOL: SHX5813

## 2022-10-28 HISTORY — PX: BIOPSY: SHX5522

## 2022-10-28 HISTORY — PX: POLYPECTOMY: SHX149

## 2022-10-28 HISTORY — PX: SAVORY DILATION: SHX5439

## 2022-10-28 LAB — HM COLONOSCOPY

## 2022-10-28 SURGERY — COLONOSCOPY WITH PROPOFOL
Anesthesia: General

## 2022-10-28 MED ORDER — PROPOFOL 500 MG/50ML IV EMUL
INTRAVENOUS | Status: DC | PRN
  Administered 2022-10-28: 150 ug/kg/min via INTRAVENOUS

## 2022-10-28 MED ORDER — OMEPRAZOLE 40 MG PO CPDR: 40.0000 mg | DELAYED_RELEASE_CAPSULE | Freq: Every day | ORAL | 3 refills | Status: DC

## 2022-10-28 MED ORDER — LACTATED RINGERS IV SOLN: INTRAVENOUS | Status: DC | PRN

## 2022-10-28 MED ORDER — LIDOCAINE HCL (CARDIAC) PF 100 MG/5ML IV SOSY
PREFILLED_SYRINGE | INTRAVENOUS | Status: DC | PRN
  Administered 2022-10-28: 100 mg via INTRAVENOUS

## 2022-10-28 MED ORDER — PROPOFOL 10 MG/ML IV BOLUS
INTRAVENOUS | Status: DC | PRN
  Administered 2022-10-28: 50 mg via INTRAVENOUS

## 2022-10-28 NOTE — Op Note (Signed)
[facility] Patient Name: [name] Procedure Date: 10/28/2022 12:47 PM MRN: [id number] Date of Birth: 08/07/1951 Attending MD: Maylon Peppers , , [id number] CSN: [id number] Age: 71 Admit Type: Outpatient Procedure:                Upper GI endoscopy Indications:              Dysphagia Providers:                Maylon Peppers, Crystal Page, Crane Risa Grill, Technician Referring MD:              Medicines:                Monitored Anesthesia Care Complications:            No immediate complications. Estimated Blood Loss:     Estimated blood loss: none. Procedure:                Pre-Anesthesia Assessment:                           - ASA Grade Assessment: II - A patient with mild                            systemic disease.                           - Prior to the procedure, a History and Physical                            was performed, and patient medications, allergies                            and sensitivities were reviewed. The patient's                            tolerance of previous anesthesia was reviewed.                           - The risks and benefits of the procedure and the                            sedation options and risks were discussed with the                            patient. All questions were answered and informed                            consent was obtained.                           After obtaining informed consent, the endoscope was                            passed under direct vision. Throughout the  procedure, the patient's blood pressure, pulse, and                            oxygen saturations were monitored continuously. The                            [id number]-H190 ([id number]) scope was introduced through the                            mouth, and advanced to the second part of duodenum.                            The upper GI endoscopy was accomplished without                             difficulty. The patient tolerated the procedure                            well. Scope In: 1:15:55 PM Scope Out: 1:27:12 PM Total Procedure Duration: 0 hours 11 minutes 17 seconds  Findings:      No endoscopic abnormality was evident in the esophagus to explain the       patient's complaint of dysphagia. It was decided, however, to proceed       with dilation of the entire esophagus. A guidewire was placed and the       scope was withdrawn. Dilation was performed with a Savary dilator with       no resistance at 18 mm. The dilation site was examined following       endoscope reinsertion and showed no change. Biopsies were obtained from       the proximal and distal esophagus with cold forceps for histology.      Evidence of a Nissen fundoplication was found at the gastroesophageal       junction. The wrap appeared loose. This was traversed.      Three non-bleeding superficial gastric ulcers with a flat pigmented spot       (Forrest Class IIc) were found in the gastric antrum. The largest lesion       was 6 mm in largest dimension. Biopsies were taken with a cold forceps       for Helicobacter pylori testing.      The examined duodenum was normal. Impression:               - No endoscopic esophageal abnormality to explain                            patient's dysphagia. Esophagus dilated. Biopsied.                           - A Nissen fundoplication was found. The wrap                            appears loose.                           - Non-bleeding gastric ulcers with a flat pigmented  spot (Forrest Class IIc). Biopsied.                           - Normal examined duodenum.                           - Biopsies were taken with a cold forceps for                            evaluation of eosinophilic esophagitis. Moderate Sedation:      Per Anesthesia Care Recommendation:           - Discharge patient to home (ambulatory).                           -  Resume previous diet.                           - Await pathology results.                           - Omeprazole 40 mg qday.                           - Repeat EGD in 3 months Procedure Code(s):        --- Professional ---                           [phone], Esophagogastroduodenoscopy, flexible,                            transoral; with insertion of guide wire followed by                            passage of dilator(s) through esophagus over guide                            wire                           43239, 59, Esophagogastroduodenoscopy, flexible,                            transoral; with biopsy, single or multiple Diagnosis Code(s):        --- Professional ---                           R13.10, Dysphagia, unspecified                           Z98.890, Other specified postprocedural states                           K25.9, Gastric ulcer, unspecified as acute or                            chronic, without hemorrhage or perforation CPT copyright 2022 American [employer]. All rights reserved. The  codes documented in this report are preliminary and upon coder review may  be revised to meet current compliance requirements. Maylon Peppers, MD Maylon Peppers,  10/28/2022 2:09:41 PM This report has been signed electronically. Number of Addenda: 0

## 2022-10-28 NOTE — Anesthesia Preprocedure Evaluation (Signed)
Anesthesia Evaluation  Patient identified by MRN, date of birth, ID band Patient awake    Reviewed: Allergy & Precautions, H&P , NPO status , Patient's Chart, lab work & pertinent test results, reviewed documented beta blocker date and time   Airway Mallampati: II  TM Distance: >3 FB Neck ROM: full    Dental no notable dental hx.    Pulmonary sleep apnea and Oxygen sleep apnea , COPD, Current Smoker   Pulmonary exam normal breath sounds clear to auscultation       Cardiovascular Exercise Tolerance: Good hypertension, + CAD, + Peripheral Vascular Disease and + DOE   Rhythm:regular Rate:Normal     Neuro/Psych  Headaches PSYCHIATRIC DISORDERS Anxiety Depression     Neuromuscular disease    GI/Hepatic hiatal hernia,GERD  Medicated,,(+) Hepatitis -  Endo/Other  negative endocrine ROS    Renal/GU negative Renal ROS  negative genitourinary   Musculoskeletal   Abdominal   Peds  Hematology  (+) Blood dyscrasia, anemia   Anesthesia Other Findings   Reproductive/Obstetrics negative OB ROS                             Anesthesia Physical Anesthesia Plan  ASA: 3  Anesthesia Plan: General   Post-op Pain Management:    Induction:   PONV Risk Score and Plan: Propofol infusion  Airway Management Planned:   Additional Equipment:   Intra-op Plan:   Post-operative Plan:   Informed Consent: I have reviewed the patients History and Physical, chart, labs and discussed the procedure including the risks, benefits and alternatives for the proposed anesthesia with the patient or authorized representative who has indicated his/her understanding and acceptance.     Dental Advisory Given  Plan Discussed with: CRNA  Anesthesia Plan Comments:        Anesthesia Quick Evaluation

## 2022-10-28 NOTE — Op Note (Signed)
[facility] Patient Name: [name] Procedure Date: 10/28/2022 1:30 PM MRN: [id number] Date of Birth: [birth year] Attending MD: Maylon Peppers , , [id number] CSN: [id number] Age: 71 Admit Type: Outpatient Procedure:                Colonoscopy Indications:              Lower abdominal pain Providers:                Maylon Peppers, Leshara Page, Hartwick Risa Grill, Technician Referring MD:              Medicines:                Monitored Anesthesia Care Complications:            No immediate complications. Estimated Blood Loss:     Estimated blood loss: none. Procedure:                Pre-Anesthesia Assessment:                           - ASA Grade Assessment: II - A patient with mild                            systemic disease.                           - Prior to the procedure, a History and Physical                            was performed, and patient medications, allergies                            and sensitivities were reviewed. The patient's                            tolerance of previous anesthesia was reviewed.                           - The risks and benefits of the procedure and the                            sedation options and risks were discussed with the                            patient. All questions were answered and informed                            consent was obtained.                           After obtaining informed consent, the colonoscope                            was passed under direct vision. Throughout the  procedure, the patient's blood pressure, pulse, and                            oxygen saturations were monitored continuously. The                            [id number]-HQ190L ([id number]) scope was introduced through                            the anus and advanced to the the cecum, identified                            by appendiceal orifice and ileocecal valve. The                             colonoscopy was performed without difficulty. The                            patient tolerated the procedure well. The quality                            of the bowel preparation was good. Scope In: 1:33:26 PM Scope Out: 1:59:10 PM Scope Withdrawal Time: 0 hours 14 minutes 12 seconds  Total Procedure Duration: 0 hours 25 minutes 44 seconds  Findings:      The perianal and digital rectal examinations were normal.      A 1 mm polyp was found in the ascending colon. The polyp was sessile.       The polyp was removed with a cold biopsy forceps. Resection and       retrieval were complete.      Four sessile polyps were found in the transverse colon and cecum. The       polyps were 3 to 8 mm in size. These polyps were removed with a cold       snare. Resection and retrieval were complete.      A 5 mm polyp was found in the sigmoid colon. The polyp was sessile. The       polyp was removed with a cold snare. Resection and retrieval were       complete.      Multiple large-mouthed and small-mouthed diverticula were found in the       sigmoid colon and descending colon.      The retroflexed view of the distal rectum and anal verge was normal and       showed no anal or rectal abnormalities. Impression:               - One 1 mm polyp in the ascending colon, removed                            with a cold biopsy forceps. Resected and retrieved.                           - Four 3 to 8 mm polyps in the transverse colon and  in the cecum, removed with a cold snare. Resected                            and retrieved.                           - One 5 mm polyp in the sigmoid colon, removed with                            a cold snare. Resected and retrieved.                           - Diverticulosis in the sigmoid colon and in the                            descending colon.                           - The distal rectum and anal verge are normal on                             retroflexion view. Moderate Sedation:      Per Anesthesia Care Recommendation:           - Discharge patient to home (ambulatory).                           - Resume previous diet.                           - Await pathology results.                           - Repeat colonoscopy for surveillance based on                            pathology results.                           - No ibuprofen, naproxen, or other non-steroidal                            anti-inflammatory drugs. Procedure Code(s):        --- Professional ---                           [phone], Colonoscopy, flexible; with removal of                            tumor(s), polyp(s), or other lesion(s) by snare                            technique                           78938, 11, Colonoscopy, flexible; with biopsy,  single or multiple Diagnosis Code(s):        --- Professional ---                           D12.2, Benign neoplasm of ascending colon                           D12.5, Benign neoplasm of sigmoid colon                           D12.3, Benign neoplasm of transverse colon (hepatic                            flexure or splenic flexure)                           D12.0, Benign neoplasm of cecum                           R10.30, Lower abdominal pain, unspecified                           K57.30, Diverticulosis of large intestine without                            perforation or abscess without bleeding CPT copyright 2022 American [employer]. All rights reserved. The codes documented in this report are preliminary and upon coder review may  be revised to meet current compliance requirements. Maylon Peppers, MD Maylon Peppers,  10/28/2022 2:12:36 PM This report has been signed electronically. Number of Addenda: 0

## 2022-10-28 NOTE — Discharge Instructions (Addendum)
You are being discharged to home.  Resume your previous diet.  We are waiting for your pathology results.  Omeprazole 40 mg qday. Repeat EGD in 3  months - Repeat colonoscopy for surveillance based on pathology results.  - No ibuprofen, naproxen, or other non-steroidal anti-inflammatory drugs.
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
## 2022-10-28 NOTE — Transfer of Care (Signed)
Immediate Anesthesia Transfer of Care Note  Patient: [name]  Procedure(s) Performed: COLONOSCOPY WITH PROPOFOL ESOPHAGOGASTRODUODENOSCOPY (EGD) WITH PROPOFOL BIOPSY SAVORY DILATION POLYPECTOMY INTESTINAL  Patient Location: Short Stay  Anesthesia Type:General  Level of Consciousness: awake, alert , oriented, and patient cooperative  Airway & Oxygen Therapy: Patient Spontanous Breathing  Post-op Assessment: Report given to RN, Post -op Vital signs reviewed and stable, and Patient moving all extremities X 4  Post vital signs: Reviewed and stable  Last Vitals:  Vitals Value Taken Time  BP    Temp    Pulse    Resp    SpO2      Last Pain:  Vitals:   10/28/22 1310  TempSrc:   PainSc: 0-No pain         Complications: No notable events documented.

## 2022-10-29 ENCOUNTER — Encounter (INDEPENDENT_AMBULATORY_CARE_PROVIDER_SITE_OTHER): Payer: Self-pay | Admitting: *Deleted

## 2022-10-30 LAB — SURGICAL PATHOLOGY

## 2022-10-31 NOTE — Anesthesia Postprocedure Evaluation (Signed)
Anesthesia Post Note  Patient: [name]  Procedure(s) Performed: COLONOSCOPY WITH PROPOFOL ESOPHAGOGASTRODUODENOSCOPY (EGD) WITH PROPOFOL BIOPSY SAVORY DILATION POLYPECTOMY INTESTINAL  Patient location during evaluation: Phase II Anesthesia Type: General Level of consciousness: awake Pain management: pain level controlled Vital Signs Assessment: post-procedure vital signs reviewed and stable Respiratory status: spontaneous breathing and respiratory function stable Cardiovascular status: blood pressure returned to baseline and stable Postop Assessment: no headache and no apparent nausea or vomiting Anesthetic complications: no Comments: Late entry   No notable events documented.   Last Vitals:  Vitals:   10/28/22 1119 10/28/22 1403  BP: (!) 144/75 (!) 107/42  Pulse: 68 (!) 55  Resp: 20 18  Temp: 36.6 C 36.6 C  SpO2: 94% 95%    Last Pain:  Vitals:   10/28/22 1403  TempSrc: Oral  PainSc: 0-No pain                 Louann Sjogren

## 2022-11-06 ENCOUNTER — Encounter (HOSPITAL_COMMUNITY): Payer: Self-pay | Admitting: Gastroenterology

## 2022-11-06 NOTE — Addendum Note (Signed)
Addendum  created 11/06/22 1004 by Karna Dupes, CRNA   Intraprocedure Staff edited

## 2022-11-07 ENCOUNTER — Encounter: Payer: Self-pay | Admitting: Internal Medicine

## 2022-11-07 ENCOUNTER — Ambulatory Visit (INDEPENDENT_AMBULATORY_CARE_PROVIDER_SITE_OTHER): Payer: Medicare HMO | Admitting: Internal Medicine

## 2022-11-07 VITALS — BP 140/78 | HR 68 | Temp 97.6°F | Ht 63.0 in | Wt 132.8 lb

## 2022-11-07 DIAGNOSIS — J449 Chronic obstructive pulmonary disease, unspecified: Secondary | ICD-10-CM

## 2022-11-07 DIAGNOSIS — F17210 Nicotine dependence, cigarettes, uncomplicated: Secondary | ICD-10-CM

## 2022-11-07 NOTE — Assessment & Plan Note (Addendum)
Active smoker - s/p RLLobectomy 2016 - Spirometry 09/08/17   FEV1 1.98 (98%)  Ratio 0.69  With minimal curvature  -  06/19/2020   Walked RA  approx   600 ft  @ fast pace  stopped due to  End of study, slt dizzy at end s sob and sats 96%    - PFT's  07/20/20  FEV1 2.07 (95 % ) ratio 0.65  p 0 % improvement from saba p 0 prior to study with DLCO  4.22 (22%) corrects to 2.07 (45%)  for alv volume and FV curve mild concavity  Air trapping present   -  08/15/2020   Walked RA  approx   600  ft  @ fast pace  stopped due to  End of study, no sob, sats 98%    - 11/07/2022   Walked on RA  x  2  lap(s) =  approx 300  ft  @ mod pace, stopped due to R Leg  gave out  with lowest 02 sats 94% and mild sob   Only GOLD 1 despite lobectomy and still smoking with main c/o on cigs = CB though mucus may be getting thicker due to anthistamine rx which I suggested she limite    At this point on no maint rx, just prn saba which she doesn't find she needs  Re SABA :  I spent extra time with pt today reviewing appropriate use of albuterol for prn use on exertion with the following points: 1) saba is for relief of sob that does not improve by walking a slower pace or resting but rather if the pt does not improve after trying this first. 2) If the pt is convinced, as many are, that saba helps recover from activity faster then it's easy to tell if this is the case by re-challenging : ie stop, take the inhaler, then p 5 minutes try the exact same activity (intensity of workload) that just caused the symptoms and see if they are substantially diminished or not after saba 3) if there is an activity that reproducibly causes the symptoms, try the saba 15 min before the activity on alternate days   If in fact the saba really does help, then fine to continue to use it prn but advised may need to look closer at the maintenance regimen(which for now is 0)  being used to achieve better control of airways disease with exertion.

## 2022-11-07 NOTE — Patient Instructions (Addendum)
For cough/ congestion > mucinex 1200 mg every 12 hours and use the flutter valve as much as possible   Bentyl and cigarettes are the biggest factors for your cough  The key is to stop smoking completely before smoking completely stops you!    Pulmonary follow up is as needed

## 2022-11-07 NOTE — Assessment & Plan Note (Signed)
Counseled re importance of smoking cessation but did not meet time criteria for separate billing    F/u pulmonary can be prn at this point  Each maintenance medication was reviewed in detail including emphasizing most importantly the difference between maintenance and prns and under what circumstances the prns are to be triggered using an action plan format where appropriate.  Total time for H and P, chart review, counseling, reviewing hfa device(s) , directly observing portions of ambulatory 02 saturation study/ and generating customized AVS unique to this office visit / same day charting = 32 min

## 2022-11-07 NOTE — Progress Notes (Signed)
[name], female    DOB: [birth year],    MRN: [id number]   Brief patient profile:  71  yowf active smoker healthy until  2016 incidental  RLL nodule   DIAGNOSIS:  Stage IA (T1a, N0, M0) non-small cell lung cancer, moderately differentiated squamous cell carcinoma diagnosed in July 2016   PRIOR THERAPY: Right VATS with right lower lobectomy with lymph node dissection under the care of Dr. Servando Jones on 07/25/2015     History of Present Illness  06/19/2020  Pulmonary/ 1st office eval/ [name] / [name] Office  Chief Complaint  Patient presents with   Consult    Shortness of breath with exertion  Dyspnea:  50 -100 ft (not verified on 600 ft walk on day of ov)  Cough: immediately lying down and in am clear x rattling  Sleep: on side one pillow  SABA use: none / "spiriva hurt my lungs" 02  2lpm hs only  rec The key is to stop smoking completely before smoking completely stops you - good luck! I will document your problems with the safety lock to support your claim you have trouble using it. No need for medications for now   08/15/2020  f/u ov/[name] re: COPD GOLD 1 still smoking / no maint rx  Chief Complaint  Patient presents with   Follow-up    shortness of breath with exertion  Dyspnea:  Worse since last ov / plans to see [name] soon as also more hoarse Cough:  qhs severe and dry nose and throat  X months Sleeping: on R side bed is flat   SABA use: none  02: 2lpm hs   Rec No pulmonary medications are needed  Protonix 40 mg Take 30- 60 min before your first and last meals of the day  GERD diet reviewed, bed blocks      11/07/2022  f/u ov/[name] office/[name] re: GOLD 1  maint on no regular inhalers   Teoh "scar tissue"  Chief Complaint  Patient presents with   Follow-up    Congestion in chest she can't get up.   Dyspnea:  attibutes to awkward gait  Cough: clear mucus/ real thick in am  Sleeping: cannot tol cpap, just does 2 lpm humidified and says rests fine   SABA use: none  02: 2lpm hs  Main concern is dry mouth (taking zyrtec and bentyl prn)        No obvious day to day or daytime variability or asso urulent sputum or mucus plugs or hemoptysis or cp or chest tightness, subjective wheeze or overt sinus or hb symptoms.   Sleeping  without nocturnal  or early am exacerbation  of respiratory  c/o's or need for noct saba. Also denies any obvious fluctuation of symptoms with weather or environmental changes or other aggravating or alleviating factors except as outlined above   No unusual exposure hx or h/o childhood pna/ asthma or knowledge of premature birth.  Current Allergies, Complete Past Medical History, Past Surgical History, Family History, and Social History were reviewed in [employer] record.  ROS  The following are not active complaints unless bolded Hoarseness, sore throat, dysphagia, dental problems, itching, sneezing,  nasal congestion or discharge of excess mucus or purulent secretions, ear ache,   fever, chills, sweats, unintended wt loss or wt gain, classically pleuritic or exertional cp,  orthopnea pnd or arm/hand swelling  or leg swelling, presyncope, palpitations, abdominal pain, anorexia, nausea, vomiting, diarrhea  or change in bowel habits or change  in bladder habits, change in stools or change in urine, dysuria, hematuria,  rash, arthralgias, visual complaints, headache, numbness, weakness or ataxia or problems with walking which she attributes to sleep issues  or coordination,  change in mood or  memory.        Current Meds  Medication Sig   carisoprodol (SOMA) 350 MG tablet Take 350 mg by mouth at bedtime.   cetirizine (ZYRTEC) 10 MG tablet Take 1 tablet (10 mg total) daily by mouth. (Patient taking differently: Take 10 mg by mouth daily as needed for allergies.)   clopidogrel (PLAVIX) 75 MG tablet Take 37.5 mg by mouth daily.   dicyclomine (BENTYL) 10 MG capsule Take one capsulr by mouth three times  daily as needed,  for abdominal spasm   dicyclomine (BENTYL) 10 MG capsule Take one capsule by mouth before meals and at bedtime for 1 week, then as needed, for abdominal spasm   doxycycline (PERIOSTAT) 20 MG tablet Take 20 mg by mouth daily.   EPINEPHRINE 0.3 mg/0.3 mL IJ SOAJ injection Inject 0.3 mLs (0.3 mg total) into the muscle once for 1 dose.   ezetimibe (ZETIA) 10 MG tablet TAKE 1 TABLET EVERY DAY   flecainide (TAMBOCOR) 150 MG tablet Take 75 mg by mouth 2 (two) times daily.    hydrALAZINE (APRESOLINE) 10 MG tablet TAKE 1 TABLET THREE TIMES DAILY   HYDROcodone-acetaminophen (NORCO) 10-325 MG tablet Take 1 tablet by mouth in the morning, at noon, in the evening, and at bedtime.   MAGNESIUM PO Take 1 tablet by mouth daily.   metoprolol succinate (TOPROL-XL) 25 MG 24 hr tablet Take 12.5 mg by mouth every evening.    omeprazole (PRILOSEC) 40 MG capsule Take 1 capsule (40 mg total) by mouth daily.   OVER THE COUNTER MEDICATION Take 1 tablet by mouth daily. Super Beets   OXYGEN Inhale 2 L into the lungs at bedtime.   traZODone (DESYREL) 50 MG tablet Take 100 mg by mouth at bedtime as needed for sleep.   TURMERIC PO Take 1 capsule by mouth daily.   valsartan (DIOVAN) 320 MG tablet TAKE 1 TABLET EVERY DAY (DOSE CHANGE)   Varenicline Tartrate 0.03 MG/ACT SOLN Place 1 spray into both nostrils 2 (two) times daily as needed (dry eyes).   Vitamin D, Ergocalciferol, (DRISDOL) 1.25 MG (50000 UNIT) CAPS capsule Take 1 capsule (50,000 Units total) by mouth every 7 (seven) days.                         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 states that she can't breathe after surgery when laying on back   COPD (chronic obstructive pulmonary disease) ([place])    Coronary artery disease    Daily headache    Patient stated they are felt in back of the head, not throbing. But always in same spot. MRI's done, no reason why they occur. (10/20/2013)   Depression    Diastolic dysfunction 4/00/8676   Grade 1   Diverticulitis  pt reports 8 times. Dr. Geroge Baseman colectomy in 2009   Diverticulosis 2008   diagnosed; pt. states now cured 07/19/15   Dysrhythmia    pt. states not since ablation..history of Supraventricular tachycardia   Fibromyalgia    Fracture 2006   left foot & ankle , immobilized for healing    Gout    Recently diagnosed.   HCV antibody positive    HEARING LOSS    since age 13   Hepatitis C 1993    Needs Hepatic panel every 6   months, treated for 1 year    Hiatal hernia    "repaired"    History of blood transfusion    "probably when I was young, when I was 74" (10/20/2013)   History of pneumonia    Hyperlipidemia 2001   Hypertension 2001   Menopause    per medical history form   Night sweats    Per medical history form dated 05/02/11.   On home oxygen therapy    "2L only at night" (10/20/2013); pt. currently not wearing O2 at night (07/19/15)   Panic disorder    was followed by mental health   Sleep apnea 2001   non compliant wit the use of the machine   Sleep apnea    wear oxygen at bedtime.    SOB (shortness of breath)    "after lying in bed, go to the bathroom; heart races & I'm SOB" (10/20/2013)   SVT (supraventricular tachycardia) ([place])    s/p ablation 10-20-2013 by Dr Lovena Le   Tinnitus 2006   disabling   Wears dentures    Per medical history form dated 05/02/11.   Wears glasses        Objective:      wts  11/07/2022     132   08/15/20 125 lb 6.4 oz (56.9 kg)  08/13/20 127 lb 0.6 oz (57.6 kg)  06/19/20 123 lb 6.4 oz (56 kg)    Vital signs reviewed   11/07/2022  - Note at rest 02 sats  97% on RA   General appearance:    amb wm smoker's rattle     HEENT : Oropharynx  clea    NECK :  without  apparent JVD/ palpable Nodes/TM    LUNGS: no acc muscle use,  Min barrel  contour chest wall with bilateral  slightly decreased bs s audible wheeze and  without cough on insp or exp maneuvers and min  Hyperresonant  to  percussion bilaterally    CV:  RRR  no s3 or murmur or increase in P2, and no edema   ABD:  soft and nontender with pos end  insp Hoover's  in the supine position.  No bruits or organomegaly appreciated   MS:  Nl gait/ ext warm without deformities Or obvious joint restrictions  calf tenderness, cyanosis or clubbing     SKIN: warm and dry without lesions    NEURO:  alert, approp, nl sensorium with  no motor or cerebellar deficits apparent.              I personally reviewed images and agree with radiology impression as follows:   Chest CT w contast   06/09/22   Status post right lower lobectomy. Moderate to severe centrilobular and paraseptal emphysema   Assessment

## 2022-11-14 ENCOUNTER — Ambulatory Visit (INDEPENDENT_AMBULATORY_CARE_PROVIDER_SITE_OTHER): Payer: 59 | Admitting: Orthopedic Surgery

## 2022-11-14 ENCOUNTER — Encounter: Payer: Self-pay | Admitting: Orthopedic Surgery

## 2022-11-14 ENCOUNTER — Ambulatory Visit (INDEPENDENT_AMBULATORY_CARE_PROVIDER_SITE_OTHER): Payer: 59

## 2022-11-14 VITALS — BP 155/80 | HR 70 | Ht 63.0 in | Wt 131.0 lb

## 2022-11-14 DIAGNOSIS — G8929 Other chronic pain: Secondary | ICD-10-CM

## 2022-11-14 DIAGNOSIS — M25511 Pain in right shoulder: Secondary | ICD-10-CM
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
## 2022-11-14 NOTE — Patient Instructions (Signed)
Physical therapy for your shoulder.  If not getting better, or getting worse, consider obtaining an MRI   Medications as needed

## 2022-11-27 DIAGNOSIS — J432 Centrilobular emphysema: Secondary | ICD-10-CM | POA: Diagnosis not present

## 2022-12-02 DIAGNOSIS — J438 Other emphysema: Secondary | ICD-10-CM | POA: Diagnosis not present

## 2022-12-02 DIAGNOSIS — M19071 Primary osteoarthritis, right ankle and foot: Secondary | ICD-10-CM | POA: Diagnosis not present

## 2022-12-02 DIAGNOSIS — I482 Chronic atrial fibrillation, unspecified: Secondary | ICD-10-CM | POA: Diagnosis not present

## 2022-12-02 DIAGNOSIS — R030 Elevated blood-pressure reading, without diagnosis of hypertension: Secondary | ICD-10-CM | POA: Diagnosis not present

## 2022-12-02 DIAGNOSIS — G8929 Other chronic pain: Secondary | ICD-10-CM | POA: Diagnosis not present

## 2022-12-02 DIAGNOSIS — M25571 Pain in right ankle and joints of right foot: Secondary | ICD-10-CM | POA: Diagnosis not present

## 2022-12-02 DIAGNOSIS — R7309 Other abnormal glucose: Secondary | ICD-10-CM | POA: Diagnosis not present

## 2022-12-03 ENCOUNTER — Other Ambulatory Visit: Payer: Self-pay

## 2022-12-03 ENCOUNTER — Ambulatory Visit (HOSPITAL_COMMUNITY)
Admission: RE | Admit: 2022-12-03 | Discharge: 2022-12-03 | Disposition: A | Discharge status: Home / Self Care | Payer: 59 | Source: Ambulatory Visit | Attending: Family Medicine | Admitting: Family Medicine

## 2022-12-03 DIAGNOSIS — M549 Dorsalgia, unspecified: Secondary | ICD-10-CM | POA: Insufficient documentation

## 2022-12-03 DIAGNOSIS — M5126 Other intervertebral disc displacement, lumbar region: Secondary | ICD-10-CM | POA: Diagnosis not present

## 2022-12-03 DIAGNOSIS — I6523 Occlusion and stenosis of bilateral carotid arteries: Secondary | ICD-10-CM
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
## 2022-12-10 ENCOUNTER — Ambulatory Visit (HOSPITAL_COMMUNITY)
Admission: RE | Admit: 2022-12-10 | Discharge: 2022-12-10 | Disposition: A | Discharge status: Home / Self Care | Payer: 59 | Source: Ambulatory Visit | Attending: Cardiovascular Disease | Admitting: Cardiovascular Disease

## 2022-12-10 DIAGNOSIS — I6523 Occlusion and stenosis of bilateral carotid arteries: Secondary | ICD-10-CM

## 2022-12-11 ENCOUNTER — Telehealth: Payer: Self-pay

## 2022-12-11 NOTE — Telephone Encounter (Signed)
Error

## 2022-12-12 ENCOUNTER — Encounter: Payer: Self-pay | Admitting: Cardiovascular Disease

## 2022-12-12 ENCOUNTER — Ambulatory Visit: Payer: 59 | Attending: Cardiovascular Disease | Admitting: Cardiovascular Disease

## 2022-12-12 ENCOUNTER — Other Ambulatory Visit (HOSPITAL_COMMUNITY)
Admission: RE | Admit: 2022-12-12 | Discharge: 2022-12-12 | Disposition: A | Discharge status: Home / Self Care | Payer: 59 | Source: Ambulatory Visit | Attending: Cardiovascular Disease | Admitting: Cardiovascular Disease

## 2022-12-12 VITALS — BP 162/84 | HR 73 | Ht 63.0 in | Wt 134.2 lb

## 2022-12-12 DIAGNOSIS — I10 Essential (primary) hypertension: Secondary | ICD-10-CM

## 2022-12-12 DIAGNOSIS — I480 Paroxysmal atrial fibrillation: Secondary | ICD-10-CM | POA: Diagnosis not present

## 2022-12-12 DIAGNOSIS — E782 Mixed hyperlipidemia: Secondary | ICD-10-CM | POA: Diagnosis not present

## 2022-12-12 DIAGNOSIS — I6523 Occlusion and stenosis of bilateral carotid arteries: Secondary | ICD-10-CM | POA: Diagnosis not present

## 2022-12-12 DIAGNOSIS — R0602 Shortness of breath: Secondary | ICD-10-CM

## 2022-12-12 DIAGNOSIS — R079 Chest pain, unspecified: Secondary | ICD-10-CM

## 2022-12-12 LAB — BASIC METABOLIC PANEL
Anion gap: 9 (ref 5–15)
BUN: 18 mg/dL (ref 8–23)
CO2: 28 mmol/L (ref 22–32)
Calcium: 9.4 mg/dL (ref 8.9–10.3)
Chloride: 104 mmol/L (ref 98–111)
Creatinine, Ser: 0.59 mg/dL (ref 0.44–1.00)
GFR, Estimated: >60 mL/min (ref >60)
Glucose, Bld: 92 mg/dL (ref 70–99)
Potassium: 4.1 mmol/L (ref 3.5–5.1)
Sodium: 141 mmol/L (ref 135–145)

## 2022-12-12 MED ORDER — FLECAINIDE ACETATE 150 MG PO TABS: 75.0000 mg | ORAL_TABLET | Freq: Two times a day (BID) | ORAL | 1 refills | Status: DC

## 2022-12-12 MED ORDER — HYDRALAZINE HCL 25 MG PO TABS: 25.0000 mg | ORAL_TABLET | Freq: Three times a day (TID) | ORAL | 3 refills | Status: DC

## 2022-12-12 MED ORDER — METOPROLOL SUCCINATE ER 25 MG PO TB24: 25.0000 mg | ORAL_TABLET | Freq: Every evening | ORAL | 3 refills | Status: DC

## 2022-12-12 NOTE — Patient Instructions (Addendum)
Medication Instructions:  Your physician has recommended you make the following change in your medication:  -Increase Hydralazine to 25 mg tablets three times daily   Labwork: BMET  Testing/Procedures: Cardiac CT  Follow-Up: Follow up with Dr. Johnsie Cancel in 3 months.   Any Other Special Instructions Will Be Listed Below (If Applicable).     If you need a refill on your cardiac medications before your next appointment, please call your pharmacy.     Your cardiac CT will be scheduled at one of the below locations:   [facility] [address] [place], [place] [address] (336) [place] [address] [place], [place] [address] [phone]  [facility] [place], [place] [address] [phone]  If scheduled at [facility], please arrive at the [facility] and Children's Entrance (Entrance C2) of [facility] 30 minutes prior to test start time. You can use the FREE valet parking offered at entrance C (encouraged to control the heart rate for the test)  Proceed to the [facility] Radiology Department (first floor) to check-in and test prep.  All radiology patients and guests should use entrance C2 at [facility], accessed from [facility], even though the hospital's physical address listed is [address]    If scheduled at [facility] or [facility], please arrive 15 mins early for check-in and test prep.   Please follow these instructions carefully (unless otherwise directed):  Take 50 mg of Metoprolol 2 hours prior to CT   On the Day of the Test: Drink plenty of water until 1 hour prior to the test. Do not eat any food 1 hour prior to test. You may take your regular medications prior to the test.  Take metoprolol two hours prior to test. HOLD  Furosemide/Hydrochlorothiazide morning of the test. FEMALES- please wear underwire-free bra if available, avoid dresses & tight clothing      After the Test: Drink plenty of water. After receiving IV contrast, you may experience a mild flushed feeling. This is normal. On occasion, you may experience a mild rash up to 24 hours after the test. This is not dangerous. If this occurs, you can take Benadryl 25 mg and increase your fluid intake. If you experience trouble breathing, this can be serious. If it is severe call 911 IMMEDIATELY. If it is mild, please call our office. If you take any of these medications: Glipizide/Metformin, Avandament, Glucavance, please do not take 48 hours after completing test unless otherwise instructed.  We will call to schedule your test 2-4 weeks out understanding that some insurance companies will need an authorization prior to the service being performed.   For non-scheduling related questions, please contact the cardiac imaging nurse navigator should you have any questions/concerns: Marchia Bond, Cardiac Imaging Nurse Navigator Gordy Clement, Cardiac Imaging Nurse Navigator [facility] Direct Office Dial: [phone]   For scheduling needs, including cancellations and rescheduling, please call [place], [phone].

## 2022-12-15 ENCOUNTER — Ambulatory Visit (INDEPENDENT_AMBULATORY_CARE_PROVIDER_SITE_OTHER): Payer: Medicare HMO | Admitting: Gastroenterology

## 2022-12-15 ENCOUNTER — Encounter (INDEPENDENT_AMBULATORY_CARE_PROVIDER_SITE_OTHER): Payer: Self-pay

## 2022-12-18 ENCOUNTER — Telehealth (HOSPITAL_COMMUNITY): Payer: Self-pay | Admitting: *Deleted

## 2022-12-18 NOTE — Telephone Encounter (Signed)
Attempted to call patient regarding upcoming cardiac CT appointment. °Left message on voicemail with name and callback number ° °Amanda Fiorenza RN Navigator Cardiac Imaging °[facility] °[phone] Office °[phone] Cell ° °

## 2022-12-19 ENCOUNTER — Ambulatory Visit (HOSPITAL_COMMUNITY)
Admission: RE | Admit: 2022-12-19 | Discharge: 2022-12-19 | Disposition: A | Discharge status: Home / Self Care | Payer: 59 | Source: Ambulatory Visit | Attending: Cardiovascular Disease | Admitting: Cardiovascular Disease

## 2022-12-19 ENCOUNTER — Other Ambulatory Visit: Payer: Self-pay | Admitting: Internal Medicine

## 2022-12-19 DIAGNOSIS — R079 Chest pain, unspecified: Secondary | ICD-10-CM

## 2022-12-19 DIAGNOSIS — J439 Emphysema, unspecified: Secondary | ICD-10-CM | POA: Diagnosis not present

## 2022-12-19 DIAGNOSIS — R0602 Shortness of breath: Secondary | ICD-10-CM | POA: Diagnosis not present

## 2022-12-19 DIAGNOSIS — R931 Abnormal findings on diagnostic imaging of heart and coronary circulation: Secondary | ICD-10-CM | POA: Insufficient documentation

## 2022-12-19 DIAGNOSIS — I700 Atherosclerosis of aorta: Secondary | ICD-10-CM | POA: Diagnosis not present

## 2022-12-19 DIAGNOSIS — I2510 Atherosclerotic heart disease of native coronary artery without angina pectoris: Secondary | ICD-10-CM | POA: Insufficient documentation

## 2022-12-19 MED ORDER — IOHEXOL 350 MG/ML SOLN
100.0000 mL | Freq: Once | INTRAVENOUS | Status: AC | PRN
  Administered 2022-12-19: 100 mL via INTRAVENOUS

## 2022-12-19 MED ORDER — NITROGLYCERIN 0.4 MG SL SUBL
SUBLINGUAL_TABLET | SUBLINGUAL | Status: AC
  Filled 2022-12-19: qty 2

## 2022-12-19 MED ORDER — NITROGLYCERIN 0.4 MG SL SUBL
0.8000 mg | SUBLINGUAL_TABLET | Freq: Once | SUBLINGUAL | Status: AC
  Administered 2022-12-19: 0.8 mg via SUBLINGUAL

## 2022-12-19 NOTE — Progress Notes (Signed)
Cardiac CT sent for FFR.

## 2022-12-20 ENCOUNTER — Ambulatory Visit (HOSPITAL_BASED_OUTPATIENT_CLINIC_OR_DEPARTMENT_OTHER)
Admission: RE | Admit: 2022-12-20 | Discharge: 2022-12-20 | Disposition: A | Discharge status: Home / Self Care | Payer: 59 | Source: Ambulatory Visit | Attending: Internal Medicine | Admitting: Internal Medicine

## 2022-12-20 DIAGNOSIS — I2510 Atherosclerotic heart disease of native coronary artery without angina pectoris: Secondary | ICD-10-CM | POA: Diagnosis not present

## 2022-12-20 DIAGNOSIS — I700 Atherosclerosis of aorta: Secondary | ICD-10-CM | POA: Diagnosis not present

## 2022-12-20 DIAGNOSIS — R931 Abnormal findings on diagnostic imaging of heart and coronary circulation: Secondary | ICD-10-CM

## 2022-12-20 DIAGNOSIS — R079 Chest pain, unspecified: Secondary | ICD-10-CM | POA: Diagnosis not present

## 2022-12-20 DIAGNOSIS — J439 Emphysema, unspecified: Secondary | ICD-10-CM | POA: Diagnosis not present

## 2022-12-20 DIAGNOSIS — R0602 Shortness of breath: Secondary | ICD-10-CM | POA: Diagnosis not present

## 2022-12-24 ENCOUNTER — Encounter: Payer: Self-pay | Admitting: Cardiovascular Disease

## 2022-12-24 ENCOUNTER — Telehealth: Payer: Self-pay | Admitting: Cardiovascular Disease

## 2022-12-24 NOTE — Telephone Encounter (Signed)
Pt notified via My Chart

## 2022-12-24 NOTE — Telephone Encounter (Signed)
Patient verbalized understanding about stopping Flecanide and is questioning what to do about her rapid heart rates.
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
## 2022-12-24 NOTE — Telephone Encounter (Signed)
Patient was returning call for results. Please advise  

## 2022-12-28 DIAGNOSIS — J432 Centrilobular emphysema: Secondary | ICD-10-CM | POA: Diagnosis not present

## 2022-12-30 ENCOUNTER — Encounter (INDEPENDENT_AMBULATORY_CARE_PROVIDER_SITE_OTHER): Payer: Self-pay | Admitting: *Deleted

## 2022-12-31 DIAGNOSIS — R7309 Other abnormal glucose: Secondary | ICD-10-CM | POA: Diagnosis not present

## 2022-12-31 DIAGNOSIS — R030 Elevated blood-pressure reading, without diagnosis of hypertension: Secondary | ICD-10-CM | POA: Diagnosis not present

## 2022-12-31 DIAGNOSIS — G8929 Other chronic pain: Secondary | ICD-10-CM | POA: Diagnosis not present

## 2022-12-31 DIAGNOSIS — I10 Essential (primary) hypertension: Secondary | ICD-10-CM | POA: Diagnosis not present

## 2022-12-31 DIAGNOSIS — F17210 Nicotine dependence, cigarettes, uncomplicated: Secondary | ICD-10-CM | POA: Diagnosis not present

## 2022-12-31 DIAGNOSIS — M25571 Pain in right ankle and joints of right foot: Secondary | ICD-10-CM | POA: Diagnosis not present

## 2022-12-31 DIAGNOSIS — M19071 Primary osteoarthritis, right ankle and foot: Secondary | ICD-10-CM | POA: Diagnosis not present

## 2022-12-31 DIAGNOSIS — J438 Other emphysema: Secondary | ICD-10-CM | POA: Diagnosis not present

## 2022-12-31 DIAGNOSIS — R0602 Shortness of breath: Secondary | ICD-10-CM | POA: Diagnosis not present

## 2022-12-31 DIAGNOSIS — I482 Chronic atrial fibrillation, unspecified: Secondary | ICD-10-CM | POA: Diagnosis not present

## 2022-12-31 DIAGNOSIS — Z79899 Other long term (current) drug therapy: Secondary | ICD-10-CM | POA: Diagnosis not present

## 2023-01-02 ENCOUNTER — Ambulatory Visit (INDEPENDENT_AMBULATORY_CARE_PROVIDER_SITE_OTHER): Payer: 59 | Admitting: Pulmonary Disease

## 2023-01-02 ENCOUNTER — Encounter: Payer: Self-pay | Admitting: Pulmonary Disease

## 2023-01-02 VITALS — BP 128/72 | HR 81 | Ht 63.0 in | Wt 136.6 lb

## 2023-01-02 DIAGNOSIS — T50905A Adverse effect of unspecified drugs, medicaments and biological substances, initial encounter: Secondary | ICD-10-CM

## 2023-01-02 DIAGNOSIS — J9611 Chronic respiratory failure with hypoxia: Secondary | ICD-10-CM

## 2023-01-02 DIAGNOSIS — G4731 Primary central sleep apnea: Secondary | ICD-10-CM

## 2023-01-02 DIAGNOSIS — G4761 Periodic limb movement disorder: Secondary | ICD-10-CM | POA: Diagnosis not present

## 2023-01-02 DIAGNOSIS — G4733 Obstructive sleep apnea (adult) (pediatric): Secondary | ICD-10-CM
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
## 2023-01-02 NOTE — Patient Instructions (Signed)
Will arrange for an in lab sleep study at [facility] Will call to arrange for follow up after sleep study reviewed

## 2023-01-05 ENCOUNTER — Emergency Department (HOSPITAL_COMMUNITY): Payer: 59

## 2023-01-05 ENCOUNTER — Ambulatory Visit: Payer: 59 | Attending: Cardiovascular Disease

## 2023-01-05 ENCOUNTER — Emergency Department (HOSPITAL_COMMUNITY)
Admission: EM | Admit: 2023-01-05 | Discharge: 2023-01-05 | Disposition: A | Discharge status: Home / Self Care | Payer: 59 | Attending: Emergency Medicine | Admitting: Emergency Medicine

## 2023-01-05 ENCOUNTER — Telehealth: Payer: Self-pay | Admitting: *Deleted

## 2023-01-05 ENCOUNTER — Encounter (HOSPITAL_COMMUNITY): Payer: Self-pay

## 2023-01-05 ENCOUNTER — Other Ambulatory Visit: Payer: Self-pay

## 2023-01-05 ENCOUNTER — Other Ambulatory Visit (HOSPITAL_COMMUNITY): Payer: 59

## 2023-01-05 DIAGNOSIS — I4891 Unspecified atrial fibrillation: Secondary | ICD-10-CM | POA: Diagnosis not present

## 2023-01-05 DIAGNOSIS — I480 Paroxysmal atrial fibrillation: Secondary | ICD-10-CM

## 2023-01-05 DIAGNOSIS — I471 Supraventricular tachycardia, unspecified: Secondary | ICD-10-CM | POA: Insufficient documentation

## 2023-01-05 DIAGNOSIS — I10 Essential (primary) hypertension: Secondary | ICD-10-CM | POA: Diagnosis not present

## 2023-01-05 DIAGNOSIS — R000 Tachycardia, unspecified: Secondary | ICD-10-CM | POA: Diagnosis not present

## 2023-01-05 DIAGNOSIS — S2231XA Fracture of one rib, right side, initial encounter for closed fracture: Secondary | ICD-10-CM | POA: Diagnosis not present

## 2023-01-05 LAB — CBC WITH DIFFERENTIAL/PLATELET
Abs Immature Granulocytes: 0.04 10*3/uL (ref 0.00–0.07)
Basophils Absolute: 0 10*3/uL (ref 0.0–0.1)
Basophils Relative: 0 %
Eosinophils Absolute: 0.2 10*3/uL (ref 0.0–0.5)
Eosinophils Relative: 2 %
HCT: 39.4 % (ref 36.0–46.0)
Hemoglobin: 13.2 g/dL (ref 12.0–15.0)
Immature Granulocytes: 0 %
Lymphocytes Relative: 18 %
Lymphs Abs: 1.9 10*3/uL (ref 0.7–4.0)
MCH: 32.2 pg (ref 26.0–34.0)
MCHC: 33.5 g/dL (ref 30.0–36.0)
MCV: 96.1 fL (ref 80.0–100.0)
Monocytes Absolute: 0.8 10*3/uL (ref 0.1–1.0)
Monocytes Relative: 8 %
Neutro Abs: 7.4 10*3/uL (ref 1.7–7.7)
Neutrophils Relative %: 72 %
Platelets: 215 10*3/uL (ref 150–400)
RBC: 4.1 MIL/uL (ref 3.87–5.11)
RDW: 13.2 % (ref 11.5–15.5)
WBC: 10.4 10*3/uL (ref 4.0–10.5)
nRBC: 0 % (ref 0.0–0.2)

## 2023-01-05 LAB — URINALYSIS, ROUTINE W REFLEX MICROSCOPIC
Bilirubin Urine: NEGATIVE
Glucose, UA: NEGATIVE mg/dL
Hgb urine dipstick: NEGATIVE
Ketones, ur: NEGATIVE mg/dL
Leukocytes,Ua: NEGATIVE
Nitrite: NEGATIVE
Protein, ur: NEGATIVE mg/dL
Specific Gravity, Urine: 1.02 (ref 1.005–1.030)
pH: 6 (ref 5.0–8.0)

## 2023-01-05 LAB — COMPREHENSIVE METABOLIC PANEL
ALT: 31 U/L (ref 0–44)
AST: 37 U/L (ref 15–41)
Albumin: 3.7 g/dL (ref 3.5–5.0)
Alkaline Phosphatase: 56 U/L (ref 38–126)
Anion gap: 9 (ref 5–15)
BUN: 15 mg/dL (ref 8–23)
CO2: 24 mmol/L (ref 22–32)
Calcium: 8.9 mg/dL (ref 8.9–10.3)
Chloride: 105 mmol/L (ref 98–111)
Creatinine, Ser: 0.63 mg/dL (ref 0.44–1.00)
GFR, Estimated: >60 mL/min (ref >60)
Glucose, Bld: 98 mg/dL (ref 70–99)
Potassium: 4 mmol/L (ref 3.5–5.1)
Sodium: 138 mmol/L (ref 135–145)
Total Bilirubin: 0.3 mg/dL (ref 0.3–1.2)
Total Protein: 6.3 g/dL — ABNORMAL LOW (ref 6.5–8.1)

## 2023-01-05 LAB — MAGNESIUM: Magnesium: 1.8 mg/dL (ref 1.7–2.4)

## 2023-01-05 NOTE — ED Provider Notes (Signed)
[facility] Provider Note   CSN: [id number] Arrival date & time: 01/05/23  1954     History  Chief Complaint  Patient presents with   Atrial Fibrillation    [name] is a 73 y.o. female.  HPI 72 year old female with a history of previous arrhythmia that is described as either A-fib or SVT but had an ablation in 2014 presents with recurrent palpitations.  She has had on and off palpitations for the past couple weeks, 3 total times.  Called EMS tonight.  They noted on and off what appeared to be SVT but it went away without any treatment.  Right now she feels better.  She had some chest pressure and shortness of breath but once her heart rate resolved she states the symptoms went away.  She also felt the palpitations.  She states that she recently was taken off of flecainide which she had been on for years due to her coronary artery disease.  However she is concerned that this is the cause of her recurrent palpitations.  Otherwise some of her blood pressure meds were adjusted. She has not been ill recently.  Home Medications Prior to Admission medications   Medication Sig Start Date End Date Taking? Authorizing Provider  carisoprodol (SOMA) 350 MG tablet Take 350 mg by mouth at bedtime.    [provider]  cetirizine (ZYRTEC) 10 MG tablet Take 1 tablet (10 mg total) daily by mouth. Patient taking differently: Take 10 mg by mouth daily as needed for allergies. 09/16/17   Fayrene Helper, MD  clopidogrel (PLAVIX) 75 MG tablet Take 37.5 mg by mouth daily.    [provider]  dicyclomine (BENTYL) 10 MG capsule Take one capsulr by mouth three times daily as needed,  for abdominal spasm 08/15/22   Fayrene Helper, MD  dicyclomine (BENTYL) 10 MG capsule Take one capsule by mouth before meals and at bedtime for 1 week, then as needed, for abdominal spasm 08/15/22   Fayrene Helper, MD  doxycycline (PERIOSTAT) 20 MG  tablet Take 20 mg by mouth daily.    [provider]  EPINEPHRINE 0.3 mg/0.3 mL IJ SOAJ injection Inject 0.3 mLs (0.3 mg total) into the muscle once for 1 dose. 05/27/21   Fayrene Helper, MD  ezetimibe (ZETIA) 10 MG tablet TAKE 1 TABLET EVERY DAY 10/13/22   Fayrene Helper, MD  hydrALAZINE (APRESOLINE) 25 MG tablet Take 1 tablet (25 mg total) by mouth 3 (three) times daily. Patient taking differently: Take 50 mg by mouth 3 (three) times daily. 12/12/22 12/07/23  Josue Hector, MD  HYDROcodone-acetaminophen (NORCO) 10-325 MG tablet Take 1 tablet by mouth in the morning, at noon, in the evening, and at bedtime. 07/26/20   [provider]  MAGNESIUM PO Take 1 tablet by mouth daily.    [provider]  metoprolol succinate (TOPROL-XL) 25 MG 24 hr tablet Take 1 tablet (25 mg total) by mouth every evening. 12/12/22   Josue Hector, MD  omeprazole (PRILOSEC) 40 MG capsule Take 1 capsule (40 mg total) by mouth daily. 10/28/22   Harvel Quale, MD  OVER THE COUNTER MEDICATION Take 1 tablet by mouth daily. Super Beets    [provider]  OXYGEN Inhale 2 L into the lungs at bedtime.    [provider]  rosuvastatin (CRESTOR) 20 MG tablet Take 20 mg by mouth daily. 09/07/19 12/12/22  [provider]  traZODone ([place])  50 MG tablet Take 100 mg by mouth at bedtime as needed for sleep. 09/23/22   [provider]  TURMERIC PO Take 1 capsule by mouth daily.    [provider]  valsartan (DIOVAN) 320 MG tablet TAKE 1 TABLET EVERY DAY (DOSE CHANGE) 09/19/22   Fayrene Helper, MD  Varenicline Tartrate 0.03 MG/ACT SOLN Place 1 spray into both nostrils 2 (two) times daily as needed (dry eyes). 09/10/21   Fayrene Helper, MD  Vitamin D, Ergocalciferol, (DRISDOL) 1.25 MG (50000 UNIT) CAPS capsule Take 1 capsule (50,000 Units total) by mouth every 7 (seven) days. 10/22/22   Fayrene Helper, MD      Allergies    Aspirin,  Diphenhydramine hcl, Fluorometholone, Hydroxyzine, Meloxicam, Methocarbamol, Metronidazole, Morphine, Poison sumac extract, Salicin, Salix species, Tramadol hcl, Willow bark [white willow bark], Willow leaf swallow wort rhizome, Amlodipine, Codeine, Cymbalta [duloxetine hcl], Duloxetine, Other, Oxycodone, Pneumococcal vaccines, Prednisone, Promethazine, Spiriva respimat [tiotropium bromide monohydrate], Tramadol, Cocoa butter, Glycerin, Mineral oil, Petrolatum, Phenylephrine hcl (pressors), Preparation h [lidocaine-glycerin], Promethazine hcl, and Wellbutrin [bupropion]    Review of Systems   Review of Systems  Constitutional:  Negative for fever.  Respiratory:  Positive for shortness of breath.   Cardiovascular:  Positive for chest pain and palpitations.    Physical Exam Updated Vital Signs BP (!) 164/71   Pulse 81   Temp 98.4 F (36.9 C) (Oral)   Resp 18   Ht '5\' 3"'$  (1.6 m)   Wt 59.9 kg   SpO2 99%   BMI 23.38 kg/m  Physical Exam Vitals and nursing note reviewed.  Constitutional:      General: She is not in acute distress.    Appearance: She is well-developed. She is not ill-appearing or diaphoretic.  HENT:     Head: Normocephalic and atraumatic.  Cardiovascular:     Rate and Rhythm: Normal rate and regular rhythm.     Heart sounds: Normal heart sounds.  Pulmonary:     Effort: Pulmonary effort is normal.     Breath sounds: Normal breath sounds.  Abdominal:     Palpations: Abdomen is soft.     Tenderness: There is no abdominal tenderness.  Skin:    General: Skin is warm and dry.  Neurological:     Mental Status: She is alert.    EMS EKG   ED Results / Procedures / Treatments   Labs (all labs ordered are listed, but only abnormal results are displayed) Labs Reviewed  COMPREHENSIVE METABOLIC PANEL - Abnormal; Notable for the following components:      Result Value   Total Protein 6.3 (*)    All other components within normal limits  CBC WITH DIFFERENTIAL/PLATELET   MAGNESIUM  URINALYSIS, ROUTINE W REFLEX MICROSCOPIC    EKG EKG Interpretation  Date/Time:  Monday January 05 2023 20:08:39 EST Ventricular Rate:  80 PR Interval:  146 QRS Duration: 64 QT Interval:  328 QTC Calculation: 378 R Axis:   49 Text Interpretation: Sinus rhythm with Premature atrial complexes no acute ST/T changes similar to Dec 2023 Confirmed by Sherwood Gambler [phone]) on 01/05/2023 8:51:15 PM  Radiology DG Chest Portable 1 View  Result Date: 01/05/2023 CLINICAL DATA:  Supraventricular tachycardia EXAM: PORTABLE CHEST 1 VIEW COMPARISON:  02/28/2019 FINDINGS: Stable right-sided volume loss with mediastinal shift to the right. Lungs are clear. No pneumothorax or pleural effusion. Mild cardiomegaly is stable. Pulmonary vascularity is normal. Multiple healed right rib fractures noted. No acute bone abnormality. IMPRESSION: 1. No active  disease. Stable right-sided volume loss. Electronically Signed   By: Fidela Salisbury M.D.   On: 01/05/2023 21:54    Procedures Procedures    Medications Ordered in ED Medications - No data to display  ED Course/ Medical Decision Making/ A&P                             Medical Decision Making Amount and/or Complexity of Data Reviewed External Data Reviewed: notes. Labs: ordered.    Details: No significant electrolyte disturbance including normal potassium and magnesium. Radiology: ordered and independent interpretation performed.    Details: No CHF ECG/medicine tests: independent interpretation performed.    Details: No significant arrhythmia.  No ischemia.   Patient appears to have had recurrent SVT.  I took a photo of her EMS EKG which looks like a fast but regular rhythm.  She has had SVT before.  At this point I do not think she needs anticoagulation given this appearance though she will need to discuss this with cardiology.  Will have her referred to her cardiologist but otherwise all of her symptoms seem to go along with SVT and as  soon as the heart rate resolved her chest symptoms resolved.  Thus I doubt ACS.  Will discharge home with return precautions.        Final Clinical Impression(s) / ED Diagnoses Final diagnoses:  SVT (supraventricular tachycardia)    Rx / DC Orders ED Discharge Orders          Ordered    Ambulatory referral to Cardiology       Comments: If you have not heard from the Cardiology office within the next 72 hours please call [phone].   01/05/23 2225              Sherwood Gambler, MD 01/05/23 [phone]

## 2023-01-05 NOTE — ED Triage Notes (Signed)
Pt arrived from home via [facility] w c/o rapid heart. Per EMS upon scene pt in SVT 190's then converted to afib in 90's. Had ablation in 2014-2015. Pt has not had any issues til recent;y. This is 3rd episode in 3 weeks.

## 2023-01-05 NOTE — Telephone Encounter (Signed)
-----   Message from Josue Hector, MD sent at 12/19/2022  4:18 PM EST ----- Severe coronary calcium FFR and CTA don't show > 70% blockage so does not need cath Needs to stop flecainide F/U with me in [place] next available

## 2023-01-05 NOTE — Telephone Encounter (Signed)
Spoke with Dr. Johnsie Cancel and pt to wear 14 day heart monitor for rapid heart rate. Pt notified and agrees with plan of care.

## 2023-01-05 NOTE — ED Notes (Signed)
Pt stopped taking flecainide x 1 week per cardiologist and will be receiving holter monitor soon

## 2023-01-05 NOTE — Discharge Instructions (Signed)
If you develop recurrent, continued, or worsening chest pain, shortness of breath, fever, vomiting, abdominal or back pain, or any other new/concerning symptoms then return to the ER for evaluation.  

## 2023-01-05 NOTE — ED Provider Triage Note (Signed)
Emergency Medicine Provider Triage Evaluation Note  [name] , a 72 y.o. female  was evaluated in triage.  Pt complains of palpitations.  Patient has a history of A-fib status post ablation.  She states that over the last 2 weeks she had 3 episodes of palpitations that she thought was A-fib.  She usually cough and do vagal maneuvers and they usually helps her get back into A-fib.  Today she tried those maneuvers and did not help.  EMS noticed heart rate about 200 and patient spontaneously converted to sinus rhythm.  Patient follows up with Dr. Roosvelt Harps and was told to stop flecanide a week ago  Review of Systems  Positive: Palpitations Negative: Chest pain  Physical Exam  BP (!) 156/85   Pulse 83   Temp 98.7 F (37.1 C)   Resp 16   Ht '5\' 3"'$  (1.6 m)   Wt 59.9 kg   SpO2 93%   BMI 23.38 kg/m  Gen:   Awake, no distress   Resp:  Normal effort  MSK:   Moves extremities without difficulty  Other:    Medical Decision Making  Medically screening exam initiated at 8:08 PM.  Appropriate orders placed.  [name] was informed that the remainder of the evaluation will be completed by another provider, this initial triage assessment does not replace that evaluation, and the importance of remaining in the ED until their evaluation is complete.  [name] is a 72 y.o. female history of A-fib status post ablation here presenting with palpitations.  Patient apparently was in rapid A-fib versus SVT earlier and converted back to sinus rhythm.  Patient is sinus rhythm on EKG currently.  Will get electrolytes and patient may need cardiology consult    Drenda Freeze, MD 01/05/23 2010

## 2023-01-06 ENCOUNTER — Telehealth: Payer: Self-pay

## 2023-01-06 DIAGNOSIS — H04123 Dry eye syndrome of bilateral lacrimal glands: Secondary | ICD-10-CM | POA: Diagnosis not present

## 2023-01-06 DIAGNOSIS — H1013 Acute atopic conjunctivitis, bilateral: Secondary | ICD-10-CM | POA: Diagnosis not present

## 2023-01-06 DIAGNOSIS — H0288B Meibomian gland dysfunction left eye, upper and lower eyelids: Secondary | ICD-10-CM | POA: Diagnosis not present

## 2023-01-06 DIAGNOSIS — H43812 Vitreous degeneration, left eye: Secondary | ICD-10-CM | POA: Diagnosis not present

## 2023-01-06 DIAGNOSIS — H539 Unspecified visual disturbance: Secondary | ICD-10-CM | POA: Diagnosis not present

## 2023-01-06 DIAGNOSIS — Z961 Presence of intraocular lens: Secondary | ICD-10-CM | POA: Diagnosis not present

## 2023-01-06 DIAGNOSIS — Z9841 Cataract extraction status, right eye: Secondary | ICD-10-CM | POA: Diagnosis not present

## 2023-01-06 DIAGNOSIS — Z9842 Cataract extraction status, left eye: Secondary | ICD-10-CM | POA: Diagnosis not present

## 2023-01-06 DIAGNOSIS — H0288A Meibomian gland dysfunction right eye, upper and lower eyelids: Secondary | ICD-10-CM | POA: Diagnosis not present

## 2023-01-06 DIAGNOSIS — H0012 Chalazion right lower eyelid: Secondary | ICD-10-CM | POA: Diagnosis not present

## 2023-01-06 NOTE — Transitions of Care (Post Inpatient/ED Visit) (Unsigned)
   01/06/2023  Name: [name] MRN: [id number] DOB: 1951/05/07  Today's TOC FU Call Status: Today's TOC FU Call Status:: Unsuccessul Call (1st Attempt) Unsuccessful Call (1st Attempt) Date: 01/06/23  Attempted to reach the patient regarding the most recent Inpatient/ED visit.  Follow Up Plan: Additional outreach attempts will be made to reach the patient to complete the Transitions of Care (Post Inpatient/ED visit) call.   [place] LPN [place] Advisor Direct Dial [phone]

## 2023-01-07 DIAGNOSIS — I480 Paroxysmal atrial fibrillation: Secondary | ICD-10-CM | POA: Diagnosis not present

## 2023-01-07 NOTE — Transitions of Care (Post Inpatient/ED Visit) (Signed)
   01/07/2023  Name: [name] MRN: [id number] DOB: 02/11/1951  Today's TOC FU Call Status: Today's TOC FU Call Status:: Successful TOC FU Call Competed Unsuccessful Call (1st Attempt) Date: 01/06/23 [facility] FU Call Complete Date: 01/07/23  Transition Care Management Follow-up Telephone Call Date of Discharge: 01/07/23 Discharge Facility: [facility]) Type of Discharge: Emergency Department Reason for ED Visit: Other: How have you been since you were released from the hospital?: Better Any questions or concerns?: No  Items Reviewed: Did you receive and understand the discharge instructions provided?: Yes Medications obtained and verified?: Yes (Medications Reviewed) Any new allergies since your discharge?: No Dietary orders reviewed?: Yes Do you have support at home?: Yes  Home Care and Equipment/Supplies: [place] Ordered?: NA Any new equipment or medical supplies ordered?: NA  Functional Questionnaire: Do you need assistance with bathing/showering or dressing?: No Do you need assistance with meal preparation?: No Do you need assistance with eating?: No Do you have difficulty maintaining continence: No Do you need assistance with getting out of bed/getting out of a chair/moving?: No Do you have difficulty managing or taking your medications?: No  Folllow up appointments reviewed: PCP Follow-up appointment confirmed?: No (specialist) MD Provider Line Number:[phone] Given: Yes [facility] Follow-up appointment confirmed?: No (pt waiting to hear back from Dr Johnsie Cancel) Follow-Up Specialty Provider:: Cardiologist Do you need transportation to your follow-up appointment?: No Do you understand care options if your condition(s) worsen?: Yes-patient verbalized understanding    [place] LPN [place] Direct Dial [phone]

## 2023-01-08 ENCOUNTER — Encounter: Payer: Self-pay | Admitting: Radiology

## 2023-01-09 ENCOUNTER — Encounter: Payer: Self-pay | Admitting: Family Medicine

## 2023-01-09 ENCOUNTER — Encounter: Payer: Self-pay | Admitting: Cardiovascular Disease

## 2023-01-09 ENCOUNTER — Other Ambulatory Visit: Payer: Self-pay

## 2023-01-09 DIAGNOSIS — I480 Paroxysmal atrial fibrillation: Secondary | ICD-10-CM

## 2023-01-09 DIAGNOSIS — I471 Supraventricular tachycardia, unspecified: Secondary | ICD-10-CM

## 2023-01-12 ENCOUNTER — Other Ambulatory Visit: Payer: Self-pay | Admitting: Family Medicine

## 2023-01-12 DIAGNOSIS — H04129 Dry eye syndrome of unspecified lacrimal gland: Secondary | ICD-10-CM

## 2023-01-12 DIAGNOSIS — H547 Unspecified visual loss: Secondary | ICD-10-CM

## 2023-01-13 ENCOUNTER — Telehealth: Payer: Self-pay

## 2023-01-13 ENCOUNTER — Other Ambulatory Visit: Payer: Self-pay

## 2023-01-13 DIAGNOSIS — H04129 Dry eye syndrome of unspecified lacrimal gland: Secondary | ICD-10-CM

## 2023-01-13 DIAGNOSIS — H547 Unspecified visual loss: Secondary | ICD-10-CM

## 2023-01-13 NOTE — Telephone Encounter (Signed)
Patient aware.

## 2023-01-13 NOTE — Telephone Encounter (Signed)
     Patient  visit on 2/26  at [facility]  Have you been able to follow up with your primary care physician? Yes   The patient was or was not able to obtain any needed medicine or equipment. Yes   Are there diet recommendations that you are having difficulty following? Na   Patient expresses understanding of discharge instructions and education provided has no other needs at this time.  Yes      [place] [phone] [address], [place], [place] [address] Phone: [phone] Email: [email or web address]

## 2023-01-26 DIAGNOSIS — J432 Centrilobular emphysema: Secondary | ICD-10-CM | POA: Diagnosis not present

## 2023-01-28 ENCOUNTER — Telehealth: Payer: Self-pay | Admitting: Cardiovascular Disease

## 2023-01-28 DIAGNOSIS — I480 Paroxysmal atrial fibrillation: Secondary | ICD-10-CM | POA: Diagnosis not present

## 2023-01-28 NOTE — Telephone Encounter (Addendum)
[facility] calling with abnormal zio patch results.  

## 2023-01-28 NOTE — Telephone Encounter (Signed)
Spoke with Tyrell at [facility] pt had a 60 sec run of SVT with HR at 197 bpm on 01/08/23. Pt had a total of 397 runs of SVT. Will upload monitor when available. Please advise.

## 2023-01-28 NOTE — Telephone Encounter (Signed)
Pt scheduled to see Dr. Lovena Le 3/29.

## 2023-01-30 DIAGNOSIS — M25571 Pain in right ankle and joints of right foot: Secondary | ICD-10-CM | POA: Diagnosis not present

## 2023-01-30 DIAGNOSIS — E559 Vitamin D deficiency, unspecified: Secondary | ICD-10-CM | POA: Diagnosis not present

## 2023-01-30 DIAGNOSIS — G629 Polyneuropathy, unspecified: Secondary | ICD-10-CM | POA: Diagnosis not present

## 2023-01-30 DIAGNOSIS — R7309 Other abnormal glucose: Secondary | ICD-10-CM | POA: Diagnosis not present

## 2023-01-30 DIAGNOSIS — I471 Supraventricular tachycardia, unspecified: Secondary | ICD-10-CM | POA: Diagnosis not present

## 2023-01-30 DIAGNOSIS — Z79899 Other long term (current) drug therapy: Secondary | ICD-10-CM | POA: Diagnosis not present

## 2023-01-30 DIAGNOSIS — G8929 Other chronic pain: Secondary | ICD-10-CM | POA: Diagnosis not present

## 2023-01-30 DIAGNOSIS — R030 Elevated blood-pressure reading, without diagnosis of hypertension: Secondary | ICD-10-CM | POA: Diagnosis not present

## 2023-01-30 DIAGNOSIS — M129 Arthropathy, unspecified: Secondary | ICD-10-CM | POA: Diagnosis not present

## 2023-01-30 DIAGNOSIS — I482 Chronic atrial fibrillation, unspecified: Secondary | ICD-10-CM | POA: Diagnosis not present

## 2023-01-30 DIAGNOSIS — M19071 Primary osteoarthritis, right ankle and foot: Secondary | ICD-10-CM | POA: Diagnosis not present

## 2023-01-30 DIAGNOSIS — J438 Other emphysema: Secondary | ICD-10-CM | POA: Diagnosis not present

## 2023-01-30 DIAGNOSIS — I10 Essential (primary) hypertension: Secondary | ICD-10-CM | POA: Diagnosis not present

## 2023-01-30 DIAGNOSIS — R5383 Other fatigue: Secondary | ICD-10-CM | POA: Diagnosis not present

## 2023-02-02 ENCOUNTER — Telehealth: Payer: Self-pay | Admitting: Cardiovascular Disease

## 2023-02-02 ENCOUNTER — Encounter: Payer: Self-pay | Admitting: Cardiovascular Disease

## 2023-02-02 ENCOUNTER — Encounter: Payer: Self-pay | Admitting: Pulmonary Disease

## 2023-02-02 MED ORDER — HYDRALAZINE HCL 25 MG PO TABS: 25.0000 mg | ORAL_TABLET | Freq: Three times a day (TID) | ORAL | 3 refills | Status: DC

## 2023-02-02 NOTE — Telephone Encounter (Signed)
Pt c/o medication issue:  1. Name of Medication:  hydrALAZINE (APRESOLINE) 25 MG tablet  2. How are you currently taking this medication (dosage and times per day)?    3. Are you having a reaction (difficulty breathing--STAT)?   4. What is your medication issue?   Patient states Dr. Johnsie Cancel increased her to 50 MG three times daily. First, she would like to confirm that this is the correct dose. If so, she would like to have it sent to her local pharmacy on file, [facility], for a 90 day supply. She states she is completely out of medication.

## 2023-02-02 NOTE — Telephone Encounter (Signed)
  Per last ov note with Dr. Johnsie Cancel:  HTN:  continue ARB increase hydralazine to 25 mg tid    Patient notified and verbalized understanding. Patient had no questions or concerns at this time. Patient thankful for call regarding medication instructions.

## 2023-02-06 ENCOUNTER — Encounter: Payer: Self-pay | Admitting: Internal Medicine

## 2023-02-06 ENCOUNTER — Ambulatory Visit: Payer: 59 | Attending: Internal Medicine | Admitting: Internal Medicine

## 2023-02-06 VITALS — BP 126/72 | HR 81 | Ht 63.0 in | Wt 135.5 lb

## 2023-02-06 DIAGNOSIS — I4719 Other supraventricular tachycardia: Secondary | ICD-10-CM

## 2023-02-06 DIAGNOSIS — I10 Essential (primary) hypertension: Secondary | ICD-10-CM | POA: Diagnosis not present

## 2023-02-06 DIAGNOSIS — I480 Paroxysmal atrial fibrillation: Secondary | ICD-10-CM
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
## 2023-02-06 NOTE — Addendum Note (Signed)
Addended by: Oleta Mouse C on: 02/06/2023 05:41 PM   Modules accepted: Orders

## 2023-02-06 NOTE — Patient Instructions (Addendum)
Medication Instructions:  Your physician recommends that you continue on your current medications as directed. Please refer to the Current Medication list given to you today.  *If you need a refill on your cardiac medications before your next appointment, please call your pharmacy*  Lab Work: You will need to have a CBC and BMET within 30 days of your procedure date.     If you have labs (blood work) drawn today and your tests are completely normal, you will receive your results only by: [place] (if you have MyChart) OR A paper copy in the mail If you have any lab test that is abnormal or we need to change your treatment, we will call you to review the results.  Testing/Procedures: None ordered.  Follow-Up: Dr Cristopher [place] ordered an SVT Ablation without anesthesia, and possible pacemaker implant.  Dr. Lovena Le requested 1st case for this procedure, and was scheduled for 03/30/23 at 730 am;  Pt given surgical scrub soap at this appointment.  Our scheduler will reach out to you to schedule a lab draw appointment.     Provider:   Cristopher [place], MD{or one of the following Advanced Practice Providers on your designated Care Team:   Tommye Standard, [place] "[facility]" [place], [place]

## 2023-02-09 NOTE — Addendum Note (Signed)
Addended by: Michelle Nasuti on: 02/09/2023 09:27 AM   Modules accepted: Orders

## 2023-02-10 ENCOUNTER — Ambulatory Visit: Payer: 59 | Attending: Pulmonary Disease | Admitting: Pulmonary Disease

## 2023-02-10 ENCOUNTER — Encounter: Payer: 59 | Admitting: Pulmonary Disease

## 2023-02-10 ENCOUNTER — Ambulatory Visit: Payer: 59 | Admitting: Internal Medicine

## 2023-02-10 DIAGNOSIS — G4733 Obstructive sleep apnea (adult) (pediatric): Secondary | ICD-10-CM

## 2023-02-10 DIAGNOSIS — T50905A Adverse effect of unspecified drugs, medicaments and biological substances, initial encounter: Secondary | ICD-10-CM

## 2023-02-10 DIAGNOSIS — J9611 Chronic respiratory failure with hypoxia: Secondary | ICD-10-CM

## 2023-02-10 DIAGNOSIS — G4736 Sleep related hypoventilation in conditions classified elsewhere: Secondary | ICD-10-CM | POA: Insufficient documentation

## 2023-02-10 DIAGNOSIS — R0683 Snoring: Secondary | ICD-10-CM | POA: Insufficient documentation

## 2023-02-10 DIAGNOSIS — G4761 Periodic limb movement disorder: Secondary | ICD-10-CM

## 2023-02-17 ENCOUNTER — Telehealth: Payer: Self-pay | Admitting: Pulmonary Disease

## 2023-02-17 DIAGNOSIS — G4733 Obstructive sleep apnea (adult) (pediatric): Secondary | ICD-10-CM | POA: Diagnosis not present

## 2023-02-17 NOTE — Telephone Encounter (Signed)
PSG 02/10/23 >> AHI 10.2, SpO2 low 80%.  REM AHI 39.3.  Had 2 liters oxygen added.   Please inform her that her sleep study shows mild obstructive sleep apnea.  Please arrange for ROV with me or NP to discuss treatment options.

## 2023-02-17 NOTE — Procedures (Signed)
    Patient Name: [name], [name] Date: 02/10/2023 Gender: Female D.O.B: 08-18-1951 Age (years): 70 Referring Provider: Coralyn Helling MD, ABSM Height (inches): 63 Interpreting Physician: Coralyn Helling MD, ABSM Weight (lbs): 134 RPSGT: Alfonso Ellis BMI: 24 MRN: [id number] Neck Size: 14.00  CLINICAL INFORMATION Sleep Study Type: NPSG  Indication for sleep study: Snoring, sleep disruption, and daytime sleepiness.  Epworth Sleepiness Score: 2  SLEEP STUDY TECHNIQUE As per the AASM Manual for the Scoring of Sleep and Associated Events v2.3 (April 2016) with a hypopnea requiring 4% desaturations.  The channels recorded and monitored were frontal, central and occipital EEG, electrooculogram (EOG), submentalis EMG (chin), nasal and oral airflow, thoracic and abdominal wall motion, anterior tibialis EMG, snore microphone, electrocardiogram, and pulse oximetry.  MEDICATIONS Medications self-administered by patient taken the night of the study : N/A  SLEEP ARCHITECTURE The study was initiated at 10:36:37 PM and ended at 4:19:32 AM.  Sleep onset time was 4.2 minutes and the sleep efficiency was 82.2%. The total sleep time was 282 minutes.  Stage REM latency was 89.0 minutes.  The patient spent 3.19% of the night in stage N1 sleep, 56.91% in stage N2 sleep, 18.79% in stage N3 and 21.1% in REM.  Alpha intrusion was absent.  Supine sleep was 0.00%.  RESPIRATORY PARAMETERS The overall apnea/hypopnea index (AHI) was 10.2 per hour. There were 2 total apneas, including 2 obstructive, 0 central and 0 mixed apneas. There were 46 hypopneas and 4 RERAs.  The AHI during Stage REM sleep was 39.3 per hour.  AHI while supine was N/A per hour.  The mean oxygen saturation was 89.65%. The minimum SpO2 during sleep was 80.00%.  2 liters supplemental oxygen applied during this study.  loud snoring was noted during this study.  CARDIAC DATA The 2 lead EKG demonstrated sinus rhythm. The  mean heart rate was 74.25 beats per minute. Other EKG findings include: PVCs.  LEG MOVEMENT DATA The total PLMS were 0 with a resulting PLMS index of 0.00. Associated arousal with leg movement index was 0.0 .  IMPRESSIONS - Mild obstructive sleep apnea with an AHI of 10.2 and SpO2 low of 80%.  She had a significant REM effect to her obstructive sleep apnea. - She had 2 liters supplemental oxygen applied during this study. - The patient snored with loud snoring volume. - EKG findings include PVCs.  DIAGNOSIS - Obstructive Sleep Apnea (G47.33) - Nocturnal Hypoxemia (G47.36)  RECOMMENDATIONS - Therapeutic CPAP titration to determine optimal pressure required to alleviate sleep disordered breathing. - Alternative therapies include oral appliance, or surgical assessment. - Avoid alcohol, sedatives and other CNS depressants that may worsen sleep apnea and disrupt normal sleep architecture. - Sleep hygiene should be reviewed to assess factors that may improve sleep quality. - Weight management and regular exercise should be initiated or continued if appropriate.  [Electronically signed] 02/17/2023 01:53 PM  Coralyn Helling MD, ABSM Diplomate, American Board of Sleep Medicine NPI: [id number]  [facility] PH: [phone]   FX: [phone] ACCREDITED BY THE AMERICAN ACADEMY OF SLEEP MEDICINE

## 2023-02-19 NOTE — Telephone Encounter (Signed)
Called and spoke w/ pt she verbalized understanding. Video visit scheduled w/ BW for 4/16 @ 2:30

## 2023-02-20 ENCOUNTER — Encounter: Payer: Self-pay | Admitting: Family Medicine

## 2023-02-20 ENCOUNTER — Ambulatory Visit (INDEPENDENT_AMBULATORY_CARE_PROVIDER_SITE_OTHER): Payer: 59 | Admitting: Family Medicine

## 2023-02-20 ENCOUNTER — Encounter: Payer: Self-pay | Admitting: Internal Medicine

## 2023-02-20 VITALS — BP 125/71 | HR 71 | Resp 16 | Ht 63.0 in | Wt 134.0 lb

## 2023-02-20 DIAGNOSIS — I10 Essential (primary) hypertension: Secondary | ICD-10-CM | POA: Diagnosis not present

## 2023-02-20 DIAGNOSIS — F17210 Nicotine dependence, cigarettes, uncomplicated: Secondary | ICD-10-CM

## 2023-02-20 DIAGNOSIS — R5383 Other fatigue: Secondary | ICD-10-CM | POA: Diagnosis not present

## 2023-02-20 DIAGNOSIS — J449 Chronic obstructive pulmonary disease, unspecified: Secondary | ICD-10-CM

## 2023-02-20 DIAGNOSIS — I480 Paroxysmal atrial fibrillation: Secondary | ICD-10-CM | POA: Diagnosis not present

## 2023-02-20 DIAGNOSIS — E785 Hyperlipidemia, unspecified: Secondary | ICD-10-CM | POA: Diagnosis not present

## 2023-02-20 DIAGNOSIS — G894 Chronic pain syndrome: Secondary | ICD-10-CM

## 2023-02-20 DIAGNOSIS — D539 Nutritional anemia, unspecified: Secondary | ICD-10-CM | POA: Diagnosis not present

## 2023-02-20 DIAGNOSIS — G4734 Idiopathic sleep related nonobstructive alveolar hypoventilation: Secondary | ICD-10-CM | POA: Diagnosis not present

## 2023-02-20 DIAGNOSIS — G4733 Obstructive sleep apnea (adult) (pediatric): Secondary | ICD-10-CM | POA: Diagnosis not present

## 2023-02-20 MED ORDER — VITAMIN D (ERGOCALCIFEROL) 1.25 MG (50000 UNIT) PO CAPS: 50000.0000 [IU] | ORAL_CAPSULE | ORAL | 1 refills | Status: DC

## 2023-02-20 MED ORDER — VALSARTAN 320 MG PO TABS: ORAL_TABLET | ORAL | 1 refills | Status: DC

## 2023-02-20 MED ORDER — EZETIMIBE 10 MG PO TABS: 10.0000 mg | ORAL_TABLET | Freq: Every day | ORAL | 1 refills | Status: DC

## 2023-02-20 NOTE — Patient Instructions (Addendum)
F/U in 4 months, call if you need  me before    Continue to work on stopping smoking  Best with ablation   Need Iron, ferritin, B12 , folate for fatigue   Thanks for choosing [facility], we consider it a privelige to serve you.

## 2023-02-21 LAB — B12 AND FOLATE PANEL
Folate: 11.9 ng/mL (ref >3.0)
Vitamin B-12: 407 pg/mL (ref 232–1245)

## 2023-02-21 LAB — IRON: Iron: 87 ug/dL (ref 27–139)

## 2023-02-21 LAB — FERRITIN: Ferritin: 292 ng/mL — ABNORMAL HIGH (ref 15–150)

## 2023-02-24 ENCOUNTER — Telehealth (INDEPENDENT_AMBULATORY_CARE_PROVIDER_SITE_OTHER): Payer: 59 | Admitting: Primary Care

## 2023-02-24 DIAGNOSIS — G4733 Obstructive sleep apnea (adult) (pediatric): Secondary | ICD-10-CM | POA: Diagnosis not present

## 2023-02-24 NOTE — Progress Notes (Signed)
Reviewed and agree with assessment/plan.   Coralyn Helling, MD [facility] Pulmonary/Critical Care 02/24/2023, 3:17 PM Pager:  [phone]

## 2023-02-24 NOTE — Progress Notes (Signed)
Virtual Visit via Video Note  I connected with [name] on 02/24/23 at  2:30 PM EDT by a video enabled telemedicine application and verified that I am speaking with the correct person using two identifiers.  Location: Patient: Home Provider: Office   I discussed the limitations of evaluation and management by telemedicine and the availability of in person appointments. The patient expressed understanding and agreed to proceed.  History of Present Illness: 72 year old female, current smoker. PMH significant for centrilobular emphysema, OSA, COPD, nocturnal hypoxia, lung cancer.   02/24/2023 Patient contacted today to review sleep study results. She saw Dr. Craige Cotta for sleep consult on 01/02/23. She had in-lab sleep study on 02/10/2023 that showed mild obstructive sleep apnea with moderate oxygen desaturation.  She needed to have 2 L of oxygen added.  We reviewed sleep study results with patient, recommending patient be started on CPAP but patient states that she is claustrophobic and can not tolerate CPAP. She has tried several times in the past and is not willing to try again. She is unlikely able to afford oral appliance. She mainly sleeps on her side but this is starting to cause her discomfort. She is unable to sleep on her back due to apneas. She wears oxygen at night time.    Observations/Objective:  Appears well without overt respiratory symptoms  Sleep testing: PSG 02/10/23 >> AHI 10.2, SpO2 low 80%. REM AHI 39.3. Had 2 liters oxygen added.   Assessment and Plan:  Obstructive sleep apnea - Mild sleep apnea. PSG 02/10/23 >> AHI 10.2, SpO2 low 80%. REM AHI 39.3. - Patient is unable to tolerate CPAP due to claustrophobia and not willing to re-try, unable to afford oral appliance and not Inspire candidate due to low AHI - Advised patient focus on side sleeping position or look into getting wedge pillow  - Discouraged alcohol use/sedative prior to bedtime as these can worsen underlying  sleep apnea  Nocturnal hypoxia - Continue to wear 2 L of supplemental oxygen at bedtime   Follow Up Instructions:  - Follow up with Dr. Craige Cotta as needed for sleep    I discussed the assessment and treatment plan with the patient. The patient was provided an opportunity to ask questions and all were answered. The patient agreed with the plan and demonstrated an understanding of the instructions.   The patient was advised to call back or seek an in-person evaluation if the symptoms worsen or if the condition fails to improve as anticipated.  I provided 22 minutes of non-face-to-face time during this encounter.   Glenford Bayley, NP

## 2023-02-24 NOTE — Patient Instructions (Signed)
Continue to focus on side sleeping position or please look into getting a wedge pillow to elevate your head 30 degrees if wanting to sleep on your back  Continue to wear 2 L of oxygen at night  Follow-up with Dr. Craige Cotta as needed for sleep

## 2023-02-25 DIAGNOSIS — R7309 Other abnormal glucose: Secondary | ICD-10-CM | POA: Diagnosis not present

## 2023-02-25 DIAGNOSIS — G8929 Other chronic pain: Secondary | ICD-10-CM | POA: Diagnosis not present

## 2023-02-25 DIAGNOSIS — M19071 Primary osteoarthritis, right ankle and foot: Secondary | ICD-10-CM | POA: Diagnosis not present

## 2023-02-25 DIAGNOSIS — R030 Elevated blood-pressure reading, without diagnosis of hypertension: Secondary | ICD-10-CM | POA: Diagnosis not present

## 2023-02-25 DIAGNOSIS — M25571 Pain in right ankle and joints of right foot: Secondary | ICD-10-CM | POA: Diagnosis not present

## 2023-02-26 ENCOUNTER — Encounter: Payer: Self-pay | Admitting: Family Medicine

## 2023-02-26 DIAGNOSIS — J432 Centrilobular emphysema: Secondary | ICD-10-CM | POA: Diagnosis not present

## 2023-02-26 NOTE — Assessment & Plan Note (Signed)
Study in 01/2023 shows mild, CPAP recommended, however pt reports unable to tolerate

## 2023-02-26 NOTE — Assessment & Plan Note (Signed)
Managed by pain clinic, reports inadequate control

## 2023-02-26 NOTE — Assessment & Plan Note (Signed)
Iron, ferritin , B12 and folate levels checked and are normal

## 2023-02-26 NOTE — Assessment & Plan Note (Signed)
Recurrent SVT ablation and PCP placement scheduled for 03/30/2023, rate currently controlled

## 2023-02-26 NOTE — Assessment & Plan Note (Signed)
Ongoing need for supplemental oxygen at 2L/min established during sleep study in 2024

## 2023-02-26 NOTE — Assessment & Plan Note (Signed)
Hyperlipidemia:Low fat diet discussed and encouraged.   Lipid Panel  Lab Results  Component Value Date   CHOL 158 09/11/2022   HDL 65 09/11/2022   LDLCALC 67 09/11/2022   TRIG 156 (H) 09/11/2022   CHOLHDL 2.4 09/11/2022     Needs to reuce dietay fat

## 2023-02-26 NOTE — Assessment & Plan Note (Signed)
Managed by Pulmonary, continued nicotine use hence progression of disease, encoraged her to stop smoking

## 2023-02-26 NOTE — Assessment & Plan Note (Signed)
Controlled, no change in medication  

## 2023-02-26 NOTE — Progress Notes (Signed)
   VONDELL TROTTIER     MRN: [id number]      DOB: [birth year]   HPI Ms. Paris is here for follow up and re-evaluation of chronic medical conditions, medication management and review of any available recent lab and radiology data.  Preventive health is updated, specifically  Cancer screening and Immunization.   Recent sleep study shows mild sleep apnea, and the ogoing need for supplemental oxygen at 2 L/min Since her last visit she was in the ED on 2/26 with recurrent SVTat 200BPM, and is  scheduled for ablation and PPM on 5/20 C/o uncontrolled chronic right sided pain and reduced mobility reports soma was helpful but she is unable to have this prescribed C/o fatigue Still smoking , wants to quit and struggling with this ROS Denies recent fever or chills. Denies sinus pressure, nasal congestion, ear pain or sore throat. Denies chest congestion, productive cough or wheezing. Denies abdominal pain, nausea, vomiting,diarrhea or constipation.   Denies dysuria, frequency, hesitancy or incontinence. Chronic depression controlled,  has chronic anxiety   PE  BP 125/71   Pulse 71   Resp 16   Ht 5\' 3"  (1.6 m)   Wt 134 lb (60.8 kg)   SpO2 96%   BMI 23.74 kg/m   Patient alert and oriented and in no cardiopulmonary distress.  HEENT: No facial asymmetry, EOMI,     Neck adequate though reduced ROM  Chest: Clear to auscultation bilaterally.decreased but adequate air entry  CVS: S1, S2 no murmurs, no S3.Regular rate.  ABD: Soft non tender.   Ext: No edema  MS: decreased  ROM spine, shoulders, hips and knees.  Skin: Intact, no ulcerations or rash noted.  Psych: Good eye contact, normal affect. Memory intact not anxious or depressed appearing.  CNS: CN 2-12 intact, power,  normal throughout.no focal deficits noted.   Assessment & Plan  Paroxysmal A-fib (HCC) Recurrent SVT ablation and PCP placement scheduled for 03/30/2023, rate currently controlled  Essential  hypertension Controlled, no change in medication   OSA (obstructive sleep apnea) Study in 01/2023 shows mild, CPAP recommended, however pt reports unable to tolerate  Nocturnal hypoxia Ongoing need for supplemental oxygen at 2L/min established during sleep study in 2024  COPD GOLD 1/ still smoking Managed by Pulmonary, continued nicotine use hence progression of disease, encoraged her to stop smoking  FATIGUE Iron, ferritin , B12 and folate levels checked and are normal  Cigarette smoker Asked:confirms currently smokes cigarettes Assess: Unwilling to set a quit date, but is cutting back Advise: needs to QUIT to reduce risk of cancer, cardio and cerebrovascular disease Assist: counseled for 5 minutes and literature provided Arrange: follow up in 2 to 4 months   Chronic pain Managed by pain clinic, reports inadequate control  Hyperlipemia Hyperlipidemia:Low fat diet discussed and encouraged.   Lipid Panel  Lab Results  Component Value Date   CHOL 158 09/11/2022   HDL 65 09/11/2022   LDLCALC 67 09/11/2022   TRIG 156 (H) 09/11/2022   CHOLHDL 2.4 09/11/2022     Needs to reuce dietay fat

## 2023-02-26 NOTE — Assessment & Plan Note (Signed)
Asked:confirms currently smokes cigarettes °Assess: Unwilling to set a quit date, but is cutting back °Advise: needs to QUIT to reduce risk of cancer, cardio and cerebrovascular disease °Assist: counseled for 5 minutes and literature provided °Arrange: follow up in 2 to 4 months ° °

## 2023-02-27 DIAGNOSIS — H905 Unspecified sensorineural hearing loss: Secondary | ICD-10-CM | POA: Diagnosis not present

## 2023-03-05 ENCOUNTER — Encounter: Payer: Self-pay | Admitting: *Deleted
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
## 2023-03-19 ENCOUNTER — Encounter: Payer: Self-pay | Admitting: Family Medicine

## 2023-03-23 ENCOUNTER — Ambulatory Visit: Payer: 59 | Attending: Cardiovascular Disease | Admitting: Cardiovascular Disease

## 2023-03-23 ENCOUNTER — Encounter: Payer: Self-pay | Admitting: Cardiovascular Disease

## 2023-03-23 VITALS — BP 140/60 | HR 85 | Ht 63.0 in | Wt 133.4 lb

## 2023-03-23 DIAGNOSIS — R931 Abnormal findings on diagnostic imaging of heart and coronary circulation: Secondary | ICD-10-CM | POA: Diagnosis not present

## 2023-03-23 DIAGNOSIS — R0602 Shortness of breath: Secondary | ICD-10-CM

## 2023-03-23 DIAGNOSIS — I10 Essential (primary) hypertension: Secondary | ICD-10-CM | POA: Diagnosis not present

## 2023-03-23 DIAGNOSIS — I4719 Other supraventricular tachycardia: Secondary | ICD-10-CM

## 2023-03-23 DIAGNOSIS — E782 Mixed hyperlipidemia: Secondary | ICD-10-CM | POA: Diagnosis not present

## 2023-03-23 DIAGNOSIS — I6523 Occlusion and stenosis of bilateral carotid arteries: Secondary | ICD-10-CM

## 2023-03-23 DIAGNOSIS — I480 Paroxysmal atrial fibrillation: Secondary | ICD-10-CM

## 2023-03-23 LAB — CBC WITH DIFFERENTIAL/PLATELET
Absolute Monocytes: 959 cells/uL — ABNORMAL HIGH (ref 200–950)
Basophils Absolute: 41 cells/uL (ref 0–200)
Eosinophils Absolute: 265 cells/uL (ref 15–500)
Monocytes Relative: 9.4 %
Platelets: 240 10*3/uL (ref 140–400)
RBC: 4.36 10*6/uL (ref 3.80–5.10)
Total Lymphocyte: 24.4 %

## 2023-03-23 NOTE — Patient Instructions (Signed)
Medication Instructions:  Your physician recommends that you continue on your current medications as directed. Please refer to the Current Medication list given to you today.  *If you need a refill on your cardiac medications before your next appointment, please call your pharmacy*   Lab Work: Your physician recommends that you return for lab work in: Today for Dr. Ladona Ridgel   If you have labs (blood work) drawn today and your tests are completely normal, you will receive your results only by: MyChart Message (if you have MyChart) OR A paper copy in the mail If you have any lab test that is abnormal or we need to change your treatment, we will call you to review the results.   Testing/Procedures: NONE    Follow-Up: At [facility], you and your health needs are our priority.  As part of our continuing mission to provide you with exceptional heart care, we have created designated Provider Care Teams.  These Care Teams include your primary Cardiologist (physician) and Advanced Practice Providers (APPs -  Physician Assistants and Nurse Practitioners) who all work together to provide you with the care you need, when you need it.  We recommend signing up for the patient portal called "MyChart".  Sign up information is provided on this After Visit Summary.  MyChart is used to connect with patients for Virtual Visits (Telemedicine).  Patients are able to view lab/test results, encounter notes, upcoming appointments, etc.  Non-urgent messages can be sent to your provider as well.   To learn more about what you can do with MyChart, go to [email or web address].    Your next appointment:   6 month(s)  Provider:   Charlton Haws, MD    Other Instructions Thank you for choosing [facility]!

## 2023-03-23 NOTE — Addendum Note (Signed)
Addended by: Kerney Elbe on: 03/23/2023 01:08 PM   Modules accepted: Orders

## 2023-03-24 DIAGNOSIS — M19071 Primary osteoarthritis, right ankle and foot: Secondary | ICD-10-CM | POA: Diagnosis not present

## 2023-03-24 DIAGNOSIS — M25571 Pain in right ankle and joints of right foot: Secondary | ICD-10-CM | POA: Diagnosis not present

## 2023-03-24 DIAGNOSIS — G8929 Other chronic pain: Secondary | ICD-10-CM | POA: Diagnosis not present

## 2023-03-24 LAB — CBC WITH DIFFERENTIAL/PLATELET
Basophils Relative: 0.4 %
Eosinophils Relative: 2.6 %
HCT: 40.6 % (ref 35.0–45.0)
Hemoglobin: 13.9 g/dL (ref 11.7–15.5)
Lymphs Abs: 2489 cells/uL (ref 850–3900)
MCH: 31.9 pg (ref 27.0–33.0)
MCHC: 34.2 g/dL (ref 32.0–36.0)
MCV: 93.1 fL (ref 80.0–100.0)
MPV: 10.4 fL (ref 7.5–12.5)
Neutro Abs: 6446 cells/uL (ref 1500–7800)
Neutrophils Relative %: 63.2 %
RDW: 12.9 % (ref 11.0–15.0)
WBC: 10.2 10*3/uL (ref 3.8–10.8)

## 2023-03-24 LAB — BASIC METABOLIC PANEL
BUN: 14 mg/dL (ref 7–25)
CO2: 27 mmol/L (ref 20–32)
Calcium: 9.6 mg/dL (ref 8.6–10.4)
Chloride: 105 mmol/L (ref 98–110)
Creat: 0.63 mg/dL (ref 0.60–1.00)
Glucose, Bld: 93 mg/dL (ref 65–139)
Potassium: 4.1 mmol/L (ref 3.5–5.3)
Sodium: 142 mmol/L (ref 135–146)

## 2023-03-24 NOTE — Telephone Encounter (Signed)
scheduled

## 2023-03-27 ENCOUNTER — Encounter: Payer: Self-pay | Admitting: Internal Medicine

## 2023-03-28 DIAGNOSIS — J432 Centrilobular emphysema: Secondary | ICD-10-CM | POA: Diagnosis not present

## 2023-03-29 NOTE — Pre-Procedure Instructions (Signed)
Instructed patient on the following items: Arrival time 0530 Nothing to eat or drink after midnight No meds AM of procedure Responsible person to drive you home and stay with you for 24 hrs Wash with special soap night before and morning of procedure If on anti-coagulant drug instructions Plavix- last dose was last Monday.

## 2023-03-30 ENCOUNTER — Ambulatory Visit (HOSPITAL_COMMUNITY)
Admission: RE | Admit: 2023-03-30 | Discharge: 2023-03-30 | Disposition: A | Discharge status: Home / Self Care | Payer: 59 | Attending: Internal Medicine | Admitting: Internal Medicine

## 2023-03-30 ENCOUNTER — Other Ambulatory Visit: Payer: Self-pay

## 2023-03-30 ENCOUNTER — Encounter (HOSPITAL_COMMUNITY)
Admission: RE | Disposition: A | Discharge status: Home / Self Care | Payer: Self-pay | Source: Home / Self Care | Attending: Internal Medicine

## 2023-03-30 DIAGNOSIS — Z599 Problem related to housing and economic circumstances, unspecified: Secondary | ICD-10-CM | POA: Insufficient documentation

## 2023-03-30 DIAGNOSIS — R001 Bradycardia, unspecified: Secondary | ICD-10-CM

## 2023-03-30 DIAGNOSIS — F17210 Nicotine dependence, cigarettes, uncomplicated: Secondary | ICD-10-CM | POA: Insufficient documentation

## 2023-03-30 DIAGNOSIS — J449 Chronic obstructive pulmonary disease, unspecified: Secondary | ICD-10-CM | POA: Diagnosis not present

## 2023-03-30 DIAGNOSIS — Z5941 Food insecurity: Secondary | ICD-10-CM | POA: Insufficient documentation

## 2023-03-30 DIAGNOSIS — I471 Supraventricular tachycardia, unspecified: Secondary | ICD-10-CM

## 2023-03-30 DIAGNOSIS — Z9981 Dependence on supplemental oxygen: Secondary | ICD-10-CM | POA: Diagnosis not present

## 2023-03-30 DIAGNOSIS — I4719 Other supraventricular tachycardia: Secondary | ICD-10-CM | POA: Diagnosis not present

## 2023-03-30 HISTORY — PX: SVT ABLATION: EP1225

## 2023-03-30 HISTORY — PX: PACEMAKER IMPLANT: EP1218

## 2023-03-30 SURGERY — SVT ABLATION

## 2023-03-30 MED ORDER — SODIUM CHLORIDE 0.9 % IV SOLN
INTRAVENOUS | Status: AC
  Filled 2023-03-30: qty 2

## 2023-03-30 MED ORDER — ONDANSETRON HCL 4 MG/2ML IJ SOLN: 4.0000 mg | Freq: Four times a day (QID) | INTRAMUSCULAR | Status: DC | PRN

## 2023-03-30 MED ORDER — SODIUM CHLORIDE 0.9 % IV SOLN: INTRAVENOUS | Status: DC

## 2023-03-30 MED ORDER — SODIUM CHLORIDE 0.9 % IV SOLN: 80.0000 mg | INTRAVENOUS | Status: DC

## 2023-03-30 MED ORDER — SODIUM CHLORIDE 0.9 % IV SOLN: 250.0000 mL | INTRAVENOUS | Status: DC | PRN

## 2023-03-30 MED ORDER — CEFAZOLIN SODIUM-DEXTROSE 2-4 GM/100ML-% IV SOLN: 2.0000 g | INTRAVENOUS | Status: DC

## 2023-03-30 MED ORDER — SODIUM CHLORIDE 0.9% FLUSH: 3.0000 mL | INTRAVENOUS | Status: DC | PRN

## 2023-03-30 MED ORDER — HEPARIN (PORCINE) IN NACL 1000-0.9 UT/500ML-% IV SOLN
INTRAVENOUS | Status: DC | PRN
  Administered 2023-03-30: 500 mL

## 2023-03-30 MED ORDER — CHLORHEXIDINE GLUCONATE 4 % EX SOLN: 4.0000 | Freq: Once | CUTANEOUS | Status: DC

## 2023-03-30 MED ORDER — ACETAMINOPHEN 325 MG PO TABS: 650.0000 mg | ORAL_TABLET | ORAL | Status: DC | PRN

## 2023-03-30 MED ORDER — CEFAZOLIN SODIUM-DEXTROSE 2-4 GM/100ML-% IV SOLN
INTRAVENOUS | Status: AC
  Filled 2023-03-30: qty 100

## 2023-03-30 MED ORDER — BUPIVACAINE HCL (PF) 0.25 % IJ SOLN
INTRAMUSCULAR | Status: DC | PRN
  Administered 2023-03-30: 60 mL

## 2023-03-30 MED ORDER — ISOPROTERENOL HCL 0.2 MG/ML IJ SOLN
INTRAMUSCULAR | Status: AC
  Filled 2023-03-30: qty 5

## 2023-03-30 SURGICAL SUPPLY — 13 items
BAG SNAP BAND KOVER 36X36 (MISCELLANEOUS) IMPLANT
CABLE SURGICAL S-101-97-12 (CABLE) ×2 IMPLANT
CATH EZ STEER NAV 4MM D-F CUR (ABLATOR) IMPLANT
CATH JOSEPH QUAD ALLRED 6F REP (CATHETERS) IMPLANT
CATH POLARIS X REPROCESSED (CATHETERS) IMPLANT
PACK EP LATEX FREE (CUSTOM PROCEDURE TRAY) ×1
PACK EP LF (CUSTOM PROCEDURE TRAY) ×2 IMPLANT
PAD DEFIB RADIO PHYSIO CONN (PAD) ×2 IMPLANT
PATCH CARTO3 (PAD) IMPLANT
SHEATH PINNACLE 6F 10CM (SHEATH) IMPLANT
SHEATH PINNACLE 7F 10CM (SHEATH) IMPLANT
SHEATH PINNACLE 8F 10CM (SHEATH) IMPLANT
TRAY PACEMAKER INSERTION (PACKS) ×2 IMPLANT
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
## 2023-03-30 NOTE — Progress Notes (Signed)
Site area: rt groin fv sheaths x 3 pulled by Greggory Stallion, RN Site Prior to Removal:  Level 0 Pressure Applied For: 20 minutes Manual:   yes Patient Status During Pull:  stable Post Pull Site:  Level 0 Post Pull Instructions Given:  yes Post Pull Pulses Present: rt dp palpable Dressing Applied:  gauze and tegaderm Bedrest begins @ 1015 Comments:

## 2023-03-30 NOTE — Progress Notes (Signed)
Patient was given discharge instructions. She verbalized understanding. 

## 2023-03-30 NOTE — Discharge Instructions (Signed)

## 2023-03-31 ENCOUNTER — Encounter (HOSPITAL_COMMUNITY): Payer: Self-pay | Admitting: Internal Medicine

## 2023-04-01 ENCOUNTER — Ambulatory Visit (INDEPENDENT_AMBULATORY_CARE_PROVIDER_SITE_OTHER): Payer: 59 | Admitting: Family Medicine

## 2023-04-01 ENCOUNTER — Encounter: Payer: Self-pay | Admitting: Family Medicine

## 2023-04-01 VITALS — BP 119/69 | HR 86 | Ht 63.0 in | Wt 134.0 lb

## 2023-04-01 DIAGNOSIS — E785 Hyperlipidemia, unspecified: Secondary | ICD-10-CM

## 2023-04-01 DIAGNOSIS — J449 Chronic obstructive pulmonary disease, unspecified: Secondary | ICD-10-CM | POA: Diagnosis not present

## 2023-04-01 DIAGNOSIS — F17210 Nicotine dependence, cigarettes, uncomplicated: Secondary | ICD-10-CM | POA: Diagnosis not present

## 2023-04-01 DIAGNOSIS — I4719 Other supraventricular tachycardia: Secondary | ICD-10-CM

## 2023-04-01 DIAGNOSIS — K219 Gastro-esophageal reflux disease without esophagitis: Secondary | ICD-10-CM | POA: Diagnosis not present

## 2023-04-01 DIAGNOSIS — E559 Vitamin D deficiency, unspecified: Secondary | ICD-10-CM

## 2023-04-01 DIAGNOSIS — I10 Essential (primary) hypertension: Secondary | ICD-10-CM

## 2023-04-01 DIAGNOSIS — F418 Other specified anxiety disorders: Secondary | ICD-10-CM

## 2023-04-01 DIAGNOSIS — Z1231 Encounter for screening mammogram for malignant neoplasm of breast: Secondary | ICD-10-CM

## 2023-04-01 DIAGNOSIS — M5410 Radiculopathy, site unspecified: Secondary | ICD-10-CM

## 2023-04-01 MED ORDER — ESTROGENS CONJUGATED 0.3 MG PO TABS: 0.3000 mg | ORAL_TABLET | Freq: Every day | ORAL | 3 refills | Status: DC

## 2023-04-01 NOTE — Patient Instructions (Signed)
F/U in 13 weeks, call if you need me before  Thankful that ablation went very well!  New is low dose premarin 0.3 mg daily, Try this for the next 3 months to see if it REALLY helps your right sided pain   Please plan to to continue to cut back on smoking  Fasting lipid, TSH, Vit D , cmp and EGFR day of next appointment, or 1 week before   Please schedule mammogram at checkout  Thanks for choosing [facility], we consider it a privelige to serve you.

## 2023-04-02 ENCOUNTER — Telehealth: Payer: Self-pay | Admitting: Cardiovascular Disease

## 2023-04-02 MED ORDER — ROSUVASTATIN CALCIUM 20 MG PO TABS: 20.0000 mg | ORAL_TABLET | Freq: Every day | ORAL | 3 refills | Status: DC

## 2023-04-02 NOTE — Telephone Encounter (Signed)
Completed.

## 2023-04-02 NOTE — Telephone Encounter (Signed)
*  STAT* If patient is at the pharmacy, call can be transferred to refill team.   1. Which medications need to be refilled? (please list name of each medication and dose if known)   rosuvastatin (CRESTOR) 20 MG tablet     2. Which pharmacy/location (including street and city if local pharmacy) is medication to be sent to?  [facility] - [place], [place] - [address]    3. Do they need a 30 day or 90 day supply? 90 day

## 2023-04-05 ENCOUNTER — Encounter: Payer: Self-pay | Admitting: Family Medicine

## 2023-04-05 NOTE — Assessment & Plan Note (Signed)
Controlled, no change in medication DASH diet and commitment to daily physical activity for a minimum of 30 minutes discussed and encouraged, as a part of hypertension management. The importance of attaining a healthy weight is also discussed.     04/01/2023    2:32 PM 03/30/2023    3:00 PM 03/30/2023   11:15 AM 03/30/2023   11:00 AM 03/30/2023   10:45 AM 03/30/2023   10:30 AM 03/30/2023   10:25 AM  BP/Weight  Systolic BP 119 122 150 154 160 158 173  Diastolic BP 69 53 68 71 66 78 78  Wt. (Lbs) 134.04        BMI 23.74 kg/m2

## 2023-04-05 NOTE — Assessment & Plan Note (Signed)
Trial of low dose estrogen x 3 months for symptom relief , pt requests and is aware of potential adverse s/e of ERT

## 2023-04-05 NOTE — Assessment & Plan Note (Signed)
Controlled, no change in medication  

## 2023-04-05 NOTE — Progress Notes (Signed)
[name]     MRN: [id number]      DOB: [birth year]  Chief Complaint  Patient presents with   Follow-up    R side of body no functioning, ablation on Monday no pacemaker needed, discuss low estrogen.    HPI [name] is here for follow up and re-evaluation of chronic medical conditions, medication management and review of any available recent lab and radiology data.  Preventive health is updated, specifically  Cancer screening and Immunization.   Had ablation a few days ago and already feels 100 % better. The PT denies any adverse reactions to current medications since the last visit.  Requestng a trial of low dose estrogen to see if this will afford her any relief from her chronic right sided pain for which no medication is effective,states she has a friend who gives great report of the benfit of estrogen We discussed the fact that breast is the most common cancer in women after lung, she has had lung cancer and that some breast cancers are estrogen depenpendent, still wishes to proceed for quality of life purposes Still smokes unable to set a quit date  ROS Denies recent fever or chills. Denies sinus pressure, nasal congestion, ear pain or sore throat. Denies chest congestion, productive cough or wheezing. Denies chest pains, palpitations and leg swelling Denies abdominal pain, nausea, vomiting,diarrhea or constipation.   Denies dysuria, frequency, hesitancy or incontinence. chronic joint pain, swelling and limitation in mobility. Denies headaches, seizures, numbness, or tingling. C/o  depression, anxiety or insomnia. Denies skin break down or rash.   PE  BP 119/69 (BP Location: Right Arm, Patient Position: Sitting, Cuff Size: Normal)   Pulse 86   Ht 5\' 3"  (1.6 m)   Wt 134 lb 0.6 oz (60.8 kg)   SpO2 90%   BMI 23.74 kg/m   Patient alert and oriented and in no cardiopulmonary distress.  HEENT: No facial asymmetry, EOMI,     Neck supple .  Chest: Clear to  auscultation bilaterally. Decreased air entry CVS: S1, S2 no murmurs, no S3.Regular rate.  ABD: Soft non tender.   Ext: No edema  MS: decreased  ROM spine, shoulders, hips and knees.  Skin: Intact, no ulcerations or rash noted.  Psych: Good eye contact, normal affect. Memory intact not anxious or depressed appearing.  CNS: CN 2-12 intact, power,  normal throughout.no focal deficits noted.   Assessment & Plan  AVNRT (AV nodal re-entry tachycardia) (HCC) Recent ablation andreports improved energy and sense of wellbeing  Back pain with right-sided radiculopathy Trial of low dose estrogen x 3 months for symptom relief , pt requests and is aware of potential adverse s/e of ERT  Essential hypertension Controlled, no change in medication DASH diet and commitment to daily physical activity for a minimum of 30 minutes discussed and encouraged, as a part of hypertension management. The importance of attaining a healthy weight is also discussed.     04/01/2023    2:32 PM 03/30/2023    3:00 PM 03/30/2023   11:15 AM 03/30/2023   11:00 AM 03/30/2023   10:45 AM 03/30/2023   10:30 AM 03/30/2023   10:25 AM  BP/Weight  Systolic BP 119 122 150 154 160 158 173  Diastolic BP 69 53 68 71 66 78 78  Wt. (Lbs) 134.04        BMI 23.74 kg/m2             Hyperlipemia Hyperlipidemia:Low fat diet discussed and encouraged.  Lipid Panel  Lab Results  Component Value Date   CHOL 158 09/11/2022   HDL 65 09/11/2022   LDLCALC 67 09/11/2022   TRIG 156 (H) 09/11/2022   CHOLHDL 2.4 09/11/2022     Updated lab needed at/ before next visit.   GERD Controlled, no change in medication   Depression with anxiety Controlled, no change in medication   Cigarette smoker Asked:confirms currently smokes cigarettes Assess: Unwilling to set a quit date, but is cutting back Advise: needs to QUIT to reduce risk of cancer, cardio and cerebrovascular disease Assist: counseled for 5 minutes and  literature provided Arrange: follow up in 2 to 4 months   COPD GOLD 1/ still smoking Followed by pulmonary, ongoing nicotine use despite desire to stop smoking, ongoing deterioration in lung function

## 2023-04-05 NOTE — Assessment & Plan Note (Signed)
Asked:confirms currently smokes cigarettes °Assess: Unwilling to set a quit date, but is cutting back °Advise: needs to QUIT to reduce risk of cancer, cardio and cerebrovascular disease °Assist: counseled for 5 minutes and literature provided °Arrange: follow up in 2 to 4 months ° °

## 2023-04-05 NOTE — Assessment & Plan Note (Signed)
Followed by pulmonary, ongoing nicotine use despite desire to stop smoking, ongoing deterioration in lung function

## 2023-04-05 NOTE — Assessment & Plan Note (Signed)
Recent ablation andreports improved energy and sense of wellbeing

## 2023-04-05 NOTE — Assessment & Plan Note (Signed)
Hyperlipidemia:Low fat diet discussed and encouraged.   Lipid Panel  Lab Results  Component Value Date   CHOL 158 09/11/2022   HDL 65 09/11/2022   LDLCALC 67 09/11/2022   TRIG 156 (H) 09/11/2022   CHOLHDL 2.4 09/11/2022     Updated lab needed at/ before next visit.

## 2023-04-23 DIAGNOSIS — M19071 Primary osteoarthritis, right ankle and foot: Secondary | ICD-10-CM | POA: Diagnosis not present

## 2023-04-28 ENCOUNTER — Encounter: Payer: Self-pay | Admitting: Internal Medicine

## 2023-04-28 ENCOUNTER — Ambulatory Visit: Payer: 59 | Attending: Internal Medicine | Admitting: Internal Medicine

## 2023-04-28 VITALS — BP 146/62 | HR 77 | Ht 63.0 in | Wt 139.0 lb

## 2023-04-28 DIAGNOSIS — I471 Supraventricular tachycardia, unspecified: Secondary | ICD-10-CM | POA: Diagnosis not present

## 2023-04-28 DIAGNOSIS — J432 Centrilobular emphysema: Secondary | ICD-10-CM | POA: Diagnosis not present
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
## 2023-04-28 NOTE — Patient Instructions (Addendum)
Medication Instructions:  Your physician has recommended you make the following change in your medication:  1- Take metoprolol 12.5 mg (1/2 tablet) by mouth daily for 1 week, then taking 12.5 mg (1/2 tablet) by mouth every other day for 1 week, then stop  *If you need a refill on your cardiac medications before your next appointment, please call your pharmacy*  Lab Work: If you have labs (blood work) drawn today and your tests are completely normal, you will receive your results only by: MyChart Message (if you have MyChart) OR A paper copy in the mail If you have any lab test that is abnormal or we need to change your treatment, we will call you to review the results.  Testing/Procedures: None ordered today.  Follow-Up: At [facility], you and your health needs are our priority.  As part of our continuing mission to provide you with exceptional heart care, we have created designated Provider Care Teams.  These Care Teams include your primary Cardiologist (physician) and Advanced Practice Providers (APPs -  Physician Assistants and Nurse Practitioners) who all work together to provide you with the care you need, when you need it.  We recommend signing up for the patient portal called "MyChart".  Sign up information is provided on this After Visit Summary.  MyChart is used to connect with patients for Virtual Visits (Telemedicine).  Patients are able to view lab/test results, encounter notes, upcoming appointments, etc.  Non-urgent messages can be sent to your provider as well.   To learn more about what you can do with MyChart, go to [email or web address].    Your next appointment:   As needed  Provider:   You may see Lewayne Bunting, MD or one of the following Advanced Practice Providers on your designated Care Team:   Francis Dowse, [place] "Mardelle Matte" [place], [place] Sherie Don, NP

## 2023-05-13 DIAGNOSIS — Z974 Presence of external hearing-aid: Secondary | ICD-10-CM | POA: Diagnosis not present

## 2023-05-25 ENCOUNTER — Other Ambulatory Visit: Payer: Self-pay

## 2023-05-25 ENCOUNTER — Telehealth: Payer: Self-pay

## 2023-05-25 DIAGNOSIS — C3490 Malignant neoplasm of unspecified part of unspecified bronchus or lung: Secondary | ICD-10-CM

## 2023-05-25 NOTE — Progress Notes (Deleted)
This nurse received a call from this patient stating that it is time for her testing and she needs to make sure her orders are sent to [facility].  This nurse noticed that CT was ordered for [employer].  Order was put in to [facility] and lab orders are available to be drawn.  No further questions or concerns noted.  Attempt to reach patient to advised of completion.  No answer at this time.

## 2023-05-25 NOTE — Telephone Encounter (Signed)
This nurse received a call from this patient stating that it is time for her testing and she needs to make sure her orders are sent to [facility].  This nurse noticed that CT was ordered for [employer].  Order was put in to [facility] and lab orders are available to be drawn.  No further questions or concerns noted.  Attempt to reach patient to advised of completion.  No answer at this time.

## 2023-05-27 DIAGNOSIS — G4700 Insomnia, unspecified: Secondary | ICD-10-CM | POA: Diagnosis not present

## 2023-05-27 DIAGNOSIS — G4730 Sleep apnea, unspecified: Secondary | ICD-10-CM | POA: Diagnosis not present

## 2023-05-27 DIAGNOSIS — M19071 Primary osteoarthritis, right ankle and foot: Secondary | ICD-10-CM | POA: Diagnosis not present

## 2023-05-27 DIAGNOSIS — Z79899 Other long term (current) drug therapy: Secondary | ICD-10-CM | POA: Diagnosis not present

## 2023-05-28 DIAGNOSIS — J432 Centrilobular emphysema: Secondary | ICD-10-CM | POA: Diagnosis not present

## 2023-06-01 ENCOUNTER — Other Ambulatory Visit: Payer: Self-pay | Admitting: Medical Oncology

## 2023-06-01 ENCOUNTER — Telehealth: Payer: Self-pay | Admitting: Medical Oncology

## 2023-06-01 DIAGNOSIS — C3490 Malignant neoplasm of unspecified part of unspecified bronchus or lung: Secondary | ICD-10-CM

## 2023-06-01 NOTE — Telephone Encounter (Signed)
Pt asking if her labs and scan orders have been sent to [facility]. I told her they were and [facility] lab staff confirmed they can see orders.

## 2023-06-11 ENCOUNTER — Ambulatory Visit (HOSPITAL_COMMUNITY)
Admission: RE | Admit: 2023-06-11 | Discharge: 2023-06-11 | Disposition: A | Discharge status: Home / Self Care | Payer: 59 | Source: Ambulatory Visit | Attending: Internal Medicine | Admitting: Internal Medicine

## 2023-06-11 DIAGNOSIS — C3490 Malignant neoplasm of unspecified part of unspecified bronchus or lung: Secondary | ICD-10-CM | POA: Insufficient documentation

## 2023-06-11 DIAGNOSIS — I7123 Aneurysm of the descending thoracic aorta, without rupture: Secondary | ICD-10-CM | POA: Diagnosis not present

## 2023-06-11 DIAGNOSIS — R911 Solitary pulmonary nodule: Secondary | ICD-10-CM | POA: Diagnosis not present

## 2023-06-11 DIAGNOSIS — J432 Centrilobular emphysema: Secondary | ICD-10-CM | POA: Diagnosis not present

## 2023-06-11 MED ORDER — IOPAMIDOL (ISOVUE-300) INJECTION 61%: 75.0000 mL | Freq: Once | INTRAVENOUS | Status: DC | PRN

## 2023-06-11 MED ORDER — IOHEXOL 300 MG/ML  SOLN
75.0000 mL | Freq: Once | INTRAMUSCULAR | Status: AC | PRN
  Administered 2023-06-11: 75 mL via INTRAVENOUS
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
## 2023-06-16 ENCOUNTER — Other Ambulatory Visit: Payer: Self-pay

## 2023-06-16 ENCOUNTER — Inpatient Hospital Stay: Payer: 59 | Attending: Internal Medicine | Admitting: Physician Assistant

## 2023-06-16 ENCOUNTER — Encounter: Payer: Self-pay | Admitting: Internal Medicine

## 2023-06-16 VITALS — BP 126/69 | HR 83 | Temp 98.4°F | Resp 16 | Wt 133.8 lb

## 2023-06-16 DIAGNOSIS — Z85118 Personal history of other malignant neoplasm of bronchus and lung: Secondary | ICD-10-CM | POA: Insufficient documentation

## 2023-06-16 DIAGNOSIS — C3490 Malignant neoplasm of unspecified part of unspecified bronchus or lung: Secondary | ICD-10-CM | POA: Diagnosis not present

## 2023-06-16 DIAGNOSIS — F17210 Nicotine dependence, cigarettes, uncomplicated: Secondary | ICD-10-CM | POA: Diagnosis not present

## 2023-06-16 DIAGNOSIS — Z08 Encounter for follow-up examination after completed treatment for malignant neoplasm: Secondary | ICD-10-CM | POA: Diagnosis not present

## 2023-06-16 DIAGNOSIS — I719 Aortic aneurysm of unspecified site, without rupture: Secondary | ICD-10-CM

## 2023-06-23 ENCOUNTER — Ambulatory Visit: Payer: 59 | Admitting: Family Medicine

## 2023-06-24 ENCOUNTER — Encounter: Payer: Self-pay | Admitting: Family Medicine

## 2023-06-25 DIAGNOSIS — G4700 Insomnia, unspecified: Secondary | ICD-10-CM | POA: Diagnosis not present

## 2023-06-25 DIAGNOSIS — F17210 Nicotine dependence, cigarettes, uncomplicated: Secondary | ICD-10-CM | POA: Diagnosis not present

## 2023-06-25 DIAGNOSIS — M19071 Primary osteoarthritis, right ankle and foot: Secondary | ICD-10-CM | POA: Diagnosis not present

## 2023-06-25 DIAGNOSIS — G4730 Sleep apnea, unspecified: Secondary | ICD-10-CM | POA: Diagnosis not present
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
## 2023-06-26 ENCOUNTER — Encounter: Payer: Self-pay | Admitting: Vascular Surgery

## 2023-06-26 ENCOUNTER — Ambulatory Visit (INDEPENDENT_AMBULATORY_CARE_PROVIDER_SITE_OTHER): Payer: 59 | Admitting: Vascular Surgery

## 2023-06-26 VITALS — BP 128/78 | HR 86 | Temp 98.1°F | Resp 18 | Ht 63.0 in | Wt 133.0 lb

## 2023-06-26 DIAGNOSIS — I719 Aortic aneurysm of unspecified site, without rupture: Secondary | ICD-10-CM | POA: Insufficient documentation

## 2023-06-26 DIAGNOSIS — I70219 Atherosclerosis of native arteries of extremities with intermittent claudication, unspecified extremity: Secondary | ICD-10-CM | POA: Diagnosis not present

## 2023-06-28 DIAGNOSIS — J432 Centrilobular emphysema: Secondary | ICD-10-CM | POA: Diagnosis not present

## 2023-06-29 ENCOUNTER — Other Ambulatory Visit: Payer: Self-pay

## 2023-06-29 DIAGNOSIS — I70219 Atherosclerosis of native arteries of extremities with intermittent claudication, unspecified extremity: Secondary | ICD-10-CM

## 2023-07-01 ENCOUNTER — Encounter: Payer: Self-pay | Admitting: Family Medicine

## 2023-07-01 ENCOUNTER — Ambulatory Visit: Payer: 59 | Admitting: Family Medicine

## 2023-07-01 VITALS — BP 106/70 | HR 97 | Ht 63.0 in | Wt 129.1 lb

## 2023-07-01 DIAGNOSIS — E785 Hyperlipidemia, unspecified: Secondary | ICD-10-CM

## 2023-07-01 DIAGNOSIS — C3431 Malignant neoplasm of lower lobe, right bronchus or lung: Secondary | ICD-10-CM | POA: Diagnosis not present

## 2023-07-01 DIAGNOSIS — E559 Vitamin D deficiency, unspecified: Secondary | ICD-10-CM | POA: Diagnosis not present

## 2023-07-01 DIAGNOSIS — R300 Dysuria: Secondary | ICD-10-CM

## 2023-07-01 DIAGNOSIS — R0602 Shortness of breath: Secondary | ICD-10-CM | POA: Diagnosis not present

## 2023-07-01 DIAGNOSIS — F17200 Nicotine dependence, unspecified, uncomplicated: Secondary | ICD-10-CM | POA: Diagnosis not present

## 2023-07-01 DIAGNOSIS — I10 Essential (primary) hypertension: Secondary | ICD-10-CM | POA: Diagnosis not present

## 2023-07-01 MED ORDER — DOXYCYCLINE HYCLATE 100 MG PO TABS: 100.0000 mg | ORAL_TABLET | Freq: Two times a day (BID) | ORAL | 0 refills | Status: DC

## 2023-07-01 NOTE — Patient Instructions (Addendum)
Annual exam in December when due, call if you need me sooner.  Urine appears as though you may have an infection, 5-day course of doxycycline is prescribed and I will follow-up with the results of the urine culture and will be in touch next week.  All the best for his procedure tomorrow.  I have referred you urgently to your cardiologist because of increasing exertional fatigue with minimal activity.  If symptoms of shortness of breath or lightheadedness develop and worsen you need to go to the emergency room.   Labs today that were ordered in May please  Continue to work on smoking cessation  Thanks for choosing [facility], we consider it a privelige to serve you.

## 2023-07-02 ENCOUNTER — Ambulatory Visit (HOSPITAL_COMMUNITY)
Admission: RE | Admit: 2023-07-02 | Discharge: 2023-07-02 | Disposition: A | Discharge status: Home / Self Care | Payer: 59 | Attending: Vascular Surgery | Admitting: Vascular Surgery

## 2023-07-02 ENCOUNTER — Encounter (HOSPITAL_COMMUNITY)
Admission: RE | Disposition: A | Discharge status: Home / Self Care | Payer: Self-pay | Source: Home / Self Care | Attending: Vascular Surgery

## 2023-07-02 ENCOUNTER — Encounter: Payer: Self-pay | Admitting: Family Medicine

## 2023-07-02 ENCOUNTER — Telehealth: Payer: Self-pay | Admitting: Vascular Surgery

## 2023-07-02 DIAGNOSIS — I7123 Aneurysm of the descending thoracic aorta, without rupture: Secondary | ICD-10-CM | POA: Insufficient documentation

## 2023-07-02 DIAGNOSIS — I70211 Atherosclerosis of native arteries of extremities with intermittent claudication, right leg: Secondary | ICD-10-CM | POA: Diagnosis not present

## 2023-07-02 DIAGNOSIS — I70219 Atherosclerosis of native arteries of extremities with intermittent claudication, unspecified extremity: Secondary | ICD-10-CM

## 2023-07-02 DIAGNOSIS — F17210 Nicotine dependence, cigarettes, uncomplicated: Secondary | ICD-10-CM | POA: Insufficient documentation

## 2023-07-02 DIAGNOSIS — J449 Chronic obstructive pulmonary disease, unspecified: Secondary | ICD-10-CM | POA: Insufficient documentation

## 2023-07-02 DIAGNOSIS — I10 Essential (primary) hypertension: Secondary | ICD-10-CM | POA: Diagnosis not present

## 2023-07-02 DIAGNOSIS — I2510 Atherosclerotic heart disease of native coronary artery without angina pectoris: Secondary | ICD-10-CM | POA: Diagnosis not present

## 2023-07-02 DIAGNOSIS — E785 Hyperlipidemia, unspecified: Secondary | ICD-10-CM | POA: Diagnosis not present

## 2023-07-02 HISTORY — PX: ABDOMINAL AORTOGRAM W/LOWER EXTREMITY: CATH118223

## 2023-07-02 HISTORY — PX: PERIPHERAL VASCULAR INTERVENTION: CATH118257

## 2023-07-02 LAB — CMP14+EGFR
Calcium: 10 mg/dL (ref 8.7–10.3)
Glucose: 100 mg/dL — ABNORMAL HIGH (ref 70–99)
Total Protein: 7.2 g/dL (ref 6.0–8.5)

## 2023-07-02 LAB — POCT I-STAT, CHEM 8
BUN: 15 mg/dL (ref 8–23)
Calcium, Ion: 1.24 mmol/L (ref 1.15–1.40)
Chloride: 107 mmol/L (ref 98–111)
Creatinine, Ser: 0.7 mg/dL (ref 0.44–1.00)
Glucose, Bld: 101 mg/dL — ABNORMAL HIGH (ref 70–99)
HCT: 39 % (ref 36.0–46.0)
Hemoglobin: 13.3 g/dL (ref 12.0–15.0)
Potassium: 4.8 mmol/L (ref 3.5–5.1)
Sodium: 140 mmol/L (ref 135–145)
TCO2: 25 mmol/L (ref 22–32)

## 2023-07-02 LAB — TSH: TSH: 1.49 u[IU]/mL (ref 0.450–4.500)

## 2023-07-02 LAB — LIPID PANEL
Chol/HDL Ratio: 2.1 ratio (ref 0.0–4.4)
Cholesterol, Total: 130 mg/dL (ref 100–199)
HDL: 63 mg/dL (ref >39)
LDL Chol Calc (NIH): 45 mg/dL (ref 0–99)
Triglycerides: 128 mg/dL (ref 0–149)
VLDL Cholesterol Cal: 22 mg/dL (ref 5–40)

## 2023-07-02 SURGERY — ABDOMINAL AORTOGRAM W/LOWER EXTREMITY
Anesthesia: LOCAL | Laterality: Bilateral

## 2023-07-02 MED ORDER — HEPARIN SODIUM (PORCINE) 1000 UNIT/ML IJ SOLN
INTRAMUSCULAR | Status: DC | PRN
  Administered 2023-07-02: 6000 [IU] via INTRAVENOUS

## 2023-07-02 MED ORDER — ONDANSETRON HCL 4 MG/2ML IJ SOLN
INTRAMUSCULAR | Status: DC | PRN
  Administered 2023-07-02: 4 mg via INTRAVENOUS

## 2023-07-02 MED ORDER — LIDOCAINE HCL (PF) 1 % IJ SOLN
INTRAMUSCULAR | Status: DC | PRN
  Administered 2023-07-02 (×2): 15 mL

## 2023-07-02 MED ORDER — SODIUM CHLORIDE 0.9 % IV SOLN: 250.0000 mL | INTRAVENOUS | Status: DC | PRN

## 2023-07-02 MED ORDER — HEPARIN (PORCINE) IN NACL 1000-0.9 UT/500ML-% IV SOLN
INTRAVENOUS | Status: DC | PRN
  Administered 2023-07-02 (×2): 500 mL

## 2023-07-02 MED ORDER — IODIXANOL 320 MG/ML IV SOLN
INTRAVENOUS | Status: DC | PRN
  Administered 2023-07-02: 125 mL

## 2023-07-02 MED ORDER — HYDRALAZINE HCL 20 MG/ML IJ SOLN: 5.0000 mg | INTRAMUSCULAR | Status: DC | PRN

## 2023-07-02 MED ORDER — FENTANYL CITRATE (PF) 100 MCG/2ML IJ SOLN
INTRAMUSCULAR | Status: AC
  Filled 2023-07-02: qty 2

## 2023-07-02 MED ORDER — ONDANSETRON HCL 4 MG/2ML IJ SOLN
INTRAMUSCULAR | Status: AC
  Filled 2023-07-02: qty 2

## 2023-07-02 MED ORDER — FENTANYL CITRATE (PF) 100 MCG/2ML IJ SOLN
INTRAMUSCULAR | Status: DC | PRN
  Administered 2023-07-02 (×4): 25 ug via INTRAVENOUS

## 2023-07-02 MED ORDER — SODIUM CHLORIDE 0.9 % IV SOLN: INTRAVENOUS | Status: DC

## 2023-07-02 MED ORDER — ACETAMINOPHEN 325 MG PO TABS: 650.0000 mg | ORAL_TABLET | ORAL | Status: DC | PRN

## 2023-07-02 MED ORDER — LABETALOL HCL 5 MG/ML IV SOLN: 10.0000 mg | INTRAVENOUS | Status: DC | PRN

## 2023-07-02 MED ORDER — MIDAZOLAM HCL 2 MG/2ML IJ SOLN
INTRAMUSCULAR | Status: AC
  Filled 2023-07-02: qty 2

## 2023-07-02 MED ORDER — ONDANSETRON HCL 4 MG/2ML IJ SOLN: 4.0000 mg | Freq: Four times a day (QID) | INTRAMUSCULAR | Status: DC | PRN

## 2023-07-02 MED ORDER — LIDOCAINE HCL (PF) 1 % IJ SOLN
INTRAMUSCULAR | Status: AC
  Filled 2023-07-02: qty 30

## 2023-07-02 MED ORDER — SODIUM CHLORIDE 0.9% FLUSH: 3.0000 mL | INTRAVENOUS | Status: DC | PRN

## 2023-07-02 MED ORDER — SODIUM CHLORIDE 0.9% FLUSH: 3.0000 mL | Freq: Two times a day (BID) | INTRAVENOUS | Status: DC

## 2023-07-02 MED ORDER — MIDAZOLAM HCL 2 MG/2ML IJ SOLN
INTRAMUSCULAR | Status: DC | PRN
  Administered 2023-07-02 (×2): 1 mg via INTRAVENOUS

## 2023-07-02 MED ORDER — HEPARIN SODIUM (PORCINE) 1000 UNIT/ML IJ SOLN
INTRAMUSCULAR | Status: AC
  Filled 2023-07-02: qty 10

## 2023-07-02 SURGICAL SUPPLY — 18 items
CATH ANGIO 5F BER2 65CM (CATHETERS) IMPLANT
CATH OMNI FLUSH 5F 65CM (CATHETERS) IMPLANT
DEVICE CLOSURE MYNXGRIP 6/7F (Vascular Products) IMPLANT
GLIDEWIRE ADV .035X260CM (WIRE) IMPLANT
KIT ENCORE 26 ADVANTAGE (KITS) IMPLANT
KIT MICROPUNCTURE NIT STIFF (SHEATH) IMPLANT
PACK CARDIAC CATHETERIZATION (CUSTOM PROCEDURE TRAY) IMPLANT
SET ATX-X65L (MISCELLANEOUS) IMPLANT
SHEATH BRITE TIP 7FR 35CM (SHEATH) IMPLANT
SHEATH PINNACLE 5F 10CM (SHEATH) IMPLANT
SHEATH PINNACLE 7F 10CM (SHEATH) IMPLANT
SHEATH PROBE COVER 6X72 (BAG) IMPLANT
STENT VIABAHN 29XCATH 80 (Permanent Stent) IMPLANT
STENT VIABAHN 39XCATH 80 (Permanent Stent) IMPLANT
STENT VIABAHN 8X29 7FR 80 (Permanent Stent) IMPLANT
STENT VIABAHN 8X39 7FR 80 (Permanent Stent) ×2 IMPLANT
WIRE HI TORQ VERSACORE 300 (WIRE) IMPLANT
WIRE HITORQ VERSACORE ST 145CM (WIRE) IMPLANT

## 2023-07-02 NOTE — Telephone Encounter (Signed)
Pt currently in hospital.

## 2023-07-02 NOTE — Progress Notes (Signed)
Patient and friend was given discharge instructions. Both verbalized understanding. 

## 2023-07-02 NOTE — Op Note (Signed)
Patient name: [name] MRN: [id number] DOB: [birth year] Sex: female  07/02/2023 Pre-operative Diagnosis: Disabling claudication right lower extremity Post-operative diagnosis:  Same Surgeon:  Cephus Shelling, MD Procedure Performed: 1.  Ultrasound-guided access left common femoral artery 2.  Aortogram with catheter selection of aorta 3.  Ultrasound-guided access right common femoral artery 4.  Kissing stents of the bilateral common iliac arteries and distal abdominal aorta (kissing 8 mm x 39 mm VBX stents) 5.  Bilateral lower extremity arteriogram with runoff from catheter in the aorta 6.  Mynx closure of bilateral common femoral arteries 7.  73 minutes of monitored moderate conscious sedation time  Indications: 72 year old female seen with disabling right lower extremity claudication.  She cannot walk more than 20 feet before she has to stop.  I could not feel a right femoral pulse.  She presents for aortogram, lower extremity arteriogram with a possible intervention with a focus on the right leg after risk benefits discussed.  Findings:   Ultrasound-guided access left common femoral artery.  Abdominal aortogram showed abdominal aorta was patent with a subtotal occlusion of the right common iliac artery at the ostium with >99% calcified stenosis.  There was also some calcified plaque in the distal abdominal aorta ~50%.  Ultimately I got retrograde access of the right common femoral artery as well.  I placed kissing stents in the common iliac arteries into the distal abdominal aorta using 8 mm x 39 mm VBX.  Widely patent stents at completion.  Runoff showed patent common femoral, profunda, SFA, popliteal and three-vessel runoff bilaterally after stenting of her iliacs.  Mynx closure of both groins.   Procedure:  The patient was identified in the holding area and taken to room 8.  The patient was then placed supine on the table and prepped and draped in the usual sterile fashion.  A  time out was called.  Patient received Versed and fentanyl for conscious moderate sedation.  Vital signs were monitored including heart rate, respiratory rate, oxygenation and blood pressure.  I was present for all of moderate sedation.  Ultrasound was used to evaluate the left common femoral artery.  It was patent .  A digital ultrasound image was acquired.  A micropuncture needle was used to access the left common femoral artery under ultrasound guidance.  An 018 wire was advanced without resistance and a micropuncture sheath was placed.  The 018 wire was removed and a benson wire was placed.  The micropuncture sheath was exchanged for a 5 french sheath.  An omniflush catheter was advanced over the wire to the level of L-1.  An abdominal angiogram was obtained.  She had a subtotal right common iliac occlusion at the ostium.  I then accessed the right common femoral artery with a micro access needle under ultrasound guidance placed a microsheath and then upsized to a 5 [place] sheath.  I then used a Glidewire advantage with a BER 2 catheter to come retrograde across the common iliac occlusion on the right.  I then had kissing wires in the abdominal aorta.  I then gave the patient 100 units/kg IV heparin.  I upsized to 7 [place] Brite tip sheath in both groins.  I then elected to place kissing 8 mm x 39 mm VBX stents in the common iliac arteries bilaterally and into the distal abdominal aorta to treat the distal abdominal aortic disease.  The stents were inflated to nominal pressure at the same time.  We got another image that  showed widely patent stents with no obvious extravasation.  We then got runoff of bilateral lower extremities with pertinent findings noted above.  Satisfied with the result short 7 French sheath were placed in both groins.  Then deployed Mynx closure devices.  Taken to holding in stable condition.  Findings: Excellent results after stenting of the bilateral common iliac arteries into the  distal abdominal aorta.  Continue Plavix statin.  She had aspirin allergy.  Will arrange follow-up in 1 month with noninvasive imaging.  Cephus Shelling, MD [facility] Office: [phone]
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
## 2023-07-02 NOTE — Telephone Encounter (Signed)
-----   Message from Cephus Shelling sent at 07/02/2023 10:53 AM EDT ----- Patient name: [name]           MRN: [id number]        DOB: 1951-03-17          Sex: female   07/02/2023 Pre-operative Diagnosis: Disabling claudication right lower extremity Post-operative diagnosis:  Same Surgeon:  Cephus Shelling, MD Procedure Performed: 1.  Ultrasound-guided access left common femoral artery 2.  Aortogram with catheter selection of aorta 3.  Ultrasound-guided access right common femoral artery 4.  Kissing stents of the bilateral common iliac arteries and distal abdominal aorta (kissing 8 mm x 39 mm VBX stents) 5.  Bilateral lower extremity arteriogram with runoff from catheter in the aorta 6.  Mynx closure of bilateral common femoral arteries 7.  73 minutes of monitored moderate conscious sedation time  #Can you arrange follow-up in one month with aortoiliac duplex and ABI?  Thanks,  Thayer Ohm

## 2023-07-03 ENCOUNTER — Encounter (HOSPITAL_COMMUNITY): Payer: Self-pay | Admitting: Vascular Surgery

## 2023-07-03 LAB — POCT ACTIVATED CLOTTING TIME: Activated Clotting Time: 250 s

## 2023-07-03 LAB — URINE CULTURE

## 2023-07-05 DIAGNOSIS — R300 Dysuria: Secondary | ICD-10-CM | POA: Insufficient documentation

## 2023-07-05 DIAGNOSIS — R0602 Shortness of breath: Secondary | ICD-10-CM | POA: Insufficient documentation

## 2023-07-05 NOTE — Assessment & Plan Note (Signed)
Cancer free in 2024, continued surveillance by Oncology

## 2023-07-05 NOTE — Assessment & Plan Note (Signed)
Hyperlipidemia:Low fat diet discussed and encouraged.   Lipid Panel  Lab Results  Component Value Date   CHOL 130 07/01/2023   HDL 63 07/01/2023   LDLCALC 45 07/01/2023   TRIG 128 07/01/2023   CHOLHDL 2.1 07/01/2023     Welll controlled on current meds continue same

## 2023-07-05 NOTE — Assessment & Plan Note (Signed)
Noted exertional fatigue worsened in past approx 4 weeks, refer for cardiology evaluation, has artherosclerotic disease, advised ED if symptoms worsen prior to scheduled visit

## 2023-07-05 NOTE — Assessment & Plan Note (Signed)
Symptomatic and abn CCUA, treat presumptively with 5 day course of doxy and f/u c/s

## 2023-07-05 NOTE — Assessment & Plan Note (Signed)
Asked:confirms currently smokes cigarettes °Assess: Unwilling to set a quit date, but is cutting back °Advise: needs to QUIT to reduce risk of cancer, cardio and cerebrovascular disease °Assist: counseled for 5 minutes and literature provided °Arrange: follow up in 2 to 4 months ° °

## 2023-07-05 NOTE — Progress Notes (Signed)
   [name]     MRN: [id number]      DOB: [birth year]  Chief Complaint  Patient presents with   Follow-up    Follow up stopped estrogen, possible uti, procedure tomorrow to find blockage in her leg    HPI [name] is here for follow up and re-evaluation of chronic medical conditions, medication management and review of any available recent lab and radiology data.  Elected not to pursue estrogen supplement, which ois good Preventive health is updated, specifically  Cancer screening and Immunization.   Recent chest scan triggered vascular surgery referral and she has planned intervention in am fo severe arterial disease of RLE, has already seen vascular surgeon, feel sas though dx is late in coming because she has been c/o RLE pain for years Recently got the great news that she remains free of recurrent / new lung cancer esp as she still smokes , she really DOES want to quit but finds it a challenge greater han what she is able to committing to at this time Still has needs as far as her home environ is concerned, no ramp access yet  ROS Denies recent fever or chills. Denies sinus pressure, nasal congestion, ear pain or sore throat. Denies chest congestion, productive cough or wheezing. C/o increase exertional fatigue with minimal activity in past approx 4 weeks Denies abdominal pain, nausea, vomiting,diarrhea or constipation.   2 day h/o  dysuria and  frequency, denies flank pin or fever Chronic  joint pain, swelling and limitation in mobility. Denies  uncontrolled depression, anxiety or insomnia. Denies skin break down or rash.   PE  BP 106/70 (BP Location: Right Arm, Patient Position: Sitting, Cuff Size: Normal)   Pulse 97   Ht 5\' 3"  (1.6 m)   Wt 129 lb 1.9 oz (58.6 kg)   SpO2 93%   BMI 22.87 kg/m   Patient alert and oriented and in no cardiopulmonary distress.  HEENT: No facial asymmetry, EOMI,     Neck supple .  Chest: Clear to auscultation bilaterally.  CVS: S1, S2  no murmurs, no S3.Regular rate.  ABD: Soft non tender.   Ext: No edema  MS: Adequate ROM spine, shoulders, hips and knees.  Skin: Intact, no ulcerations or rash noted.  Psych: Good eye contact, normal affect. Memory intact not anxious or depressed appearing.  CNS: CN 2-12 intact, power,  normal throughout.no focal deficits noted.   Assessment & Plan  Dysuria Symptomatic and abn CCUA, treat presumptively with 5 day course of doxy and f/u c/s  Shortness of breath on exertion Noted exertional fatigue worsened in past approx 4 weeks, refer for cardiology evaluation, has artherosclerotic disease, advised ED if symptoms worsen prior to scheduled visit  Lung cancer [facility]) Cancer free in 2024, continued surveillance by Oncology  Hyperlipemia Hyperlipidemia:Low fat diet discussed and encouraged.   Lipid Panel  Lab Results  Component Value Date   CHOL 130 07/01/2023   HDL 63 07/01/2023   LDLCALC 45 07/01/2023   TRIG 128 07/01/2023   CHOLHDL 2.1 07/01/2023     Welll controlled on current meds continue same  NICOTINE ADDICTION Asked:confirms currently smokes cigarettes Assess: Unwilling to set a quit date, but is cutting back Advise: needs to QUIT to reduce risk of cancer, cardio and cerebrovascular disease Assist: counseled for 5 minutes and literature provided Arrange: follow up in 2 to 4 months

## 2023-07-06 ENCOUNTER — Ambulatory Visit (INDEPENDENT_AMBULATORY_CARE_PROVIDER_SITE_OTHER): Payer: 59

## 2023-07-06 VITALS — BP 124/57 | Ht 63.0 in | Wt 130.0 lb

## 2023-07-06 DIAGNOSIS — Z0000 Encounter for general adult medical examination without abnormal findings: Secondary | ICD-10-CM | POA: Diagnosis not present

## 2023-07-06 DIAGNOSIS — Z780 Asymptomatic menopausal state: Secondary | ICD-10-CM

## 2023-07-06 DIAGNOSIS — Z789 Other specified health status: Secondary | ICD-10-CM
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
## 2023-07-06 NOTE — Patient Instructions (Signed)
[name] , Thank you for taking time to come for your Medicare Wellness Visit. I appreciate your ongoing commitment to your health goals. Please review the following plan we discussed and let me know if I can assist you in the future.   Referrals/Orders/Follow-Ups/Clinician Recommendations:  You have an order for:  []   2D Mammogram  []   3D Mammogram  [x]   Bone Density   []   Lung Cancer Screening  Please call for appointment:   [facility] [address] [place], [place] [address] [phone]  Make sure to wear two-piece clothing.  No lotions powders or deodorants the day of the appointment Make sure to bring picture ID and insurance card.  Bring list of medications you are currently taking including any supplements.   Schedule your [facility] screening mammogram through MyChart!   Log into your MyChart account.  Go to 'Visit' (or 'Appointments' if on mobile App) --> Schedule an Appointment  Under 'Select a Reason for Visit' choose the Mammogram Screening option.  Complete the pre-visit questions and select the time and place that best fits your schedule.  You are due for the vaccines checked below. You may have these done at your preferred pharmacy. Please have them fax the office proof of the vaccines so that we can update your chart.   [x]  Flu (due annually) [x]  Shingrix (Shingles vaccine) []  Pneumonia Vaccines []  TDAP (Tetanus) Vaccine every 10 years [x]  Covid-19    This is a list of the screening recommended for you and due dates:  Health Maintenance  Topic Date Due   DTaP/Tdap/Td vaccine (2 - Tdap) 02/17/2019   COVID-19 Vaccine (8 - 2023-24 season) 07/11/2022   Flu Shot  06/11/2023   Mammogram  09/25/2023   Medicare Annual Wellness Visit  07/05/2024   Colon Cancer Screening  10/29/2027   Pneumonia Vaccine  Completed   DEXA scan (bone density measurement)  Completed   Hepatitis C Screening  Completed   Zoster (Shingles)  Vaccine  Completed   HPV Vaccine  Aged Out    Advanced directives: (Declined) Advance directive discussed with you today. Even though you declined this today, please call our office should you change your mind, and we can give you the proper paperwork for you to fill out.  Next Medicare Annual Wellness Visit scheduled for next year: Yes Preventive Care 64 Years and Older, Female Preventive care refers to lifestyle choices and visits with your health care provider that can promote health and wellness. Preventive care visits are also called wellness exams. What can I expect for my preventive care visit? Counseling Your health care provider may ask you questions about your: Medical history, including: Past medical problems. Family medical history. Pregnancy and menstrual history. History of falls. Current health, including: Memory and ability to understand (cognition). Emotional well-being. Home life and relationship well-being. Sexual activity and sexual health. Lifestyle, including: Alcohol, nicotine or tobacco, and drug use. Access to firearms. Diet, exercise, and sleep habits. Work and work Astronomer. Sunscreen use. Safety issues such as seatbelt and bike helmet use. Physical exam Your health care provider will check your: Height and weight. These may be used to calculate your BMI (body mass index). BMI is a measurement that tells if you are at a healthy weight. Waist circumference. This measures the distance around your waistline. This measurement also tells if you are at a healthy weight and may help predict your risk of certain diseases, such as type 2 diabetes  and high blood pressure. Heart rate and blood pressure. Body temperature. Skin for abnormal spots. What immunizations do I need?  Vaccines are usually given at various ages, according to a schedule. Your health care provider will recommend vaccines for you based on your age, medical history, and lifestyle or other  factors, such as travel or where you work. What tests do I need? Screening Your health care provider may recommend screening tests for certain conditions. This may include: Lipid and cholesterol levels. Hepatitis C test. Hepatitis B test. HIV (human immunodeficiency virus) test. STI (sexually transmitted infection) testing, if you are at risk. Lung cancer screening. Colorectal cancer screening. Diabetes screening. This is done by checking your blood sugar (glucose) after you have not eaten for a while (fasting). Mammogram. Talk with your health care provider about how often you should have regular mammograms. BRCA-related cancer screening. This may be done if you have a family history of breast, ovarian, tubal, or peritoneal cancers. Bone density scan. This is done to screen for osteoporosis. Talk with your health care provider about your test results, treatment options, and if necessary, the need for more tests. Follow these instructions at home: Eating and drinking  Eat a diet that includes fresh fruits and vegetables, whole grains, lean protein, and low-fat dairy products. Limit your intake of foods with high amounts of sugar, saturated fats, and salt. Take vitamin and mineral supplements as recommended by your health care provider. Do not drink alcohol if your health care provider tells you not to drink. If you drink alcohol: Limit how much you have to 0-1 drink a day. Know how much alcohol is in your drink. In the [place] one drink equals one 12 oz bottle of beer (355 mL), one 5 oz glass of wine (148 mL), or one 1 oz glass of hard liquor (44 mL). Lifestyle Brush your teeth every morning and night with fluoride toothpaste. Floss one time each day. Exercise for at least 30 minutes 5 or more days each week. Do not use any products that contain nicotine or tobacco. These products include cigarettes, chewing tobacco, and vaping devices, such as e-cigarettes. If you need help quitting, ask  your health care provider. Do not use drugs. If you are sexually active, practice safe sex. Use a condom or other form of protection in order to prevent STIs. Take aspirin only as told by your health care provider. Make sure that you understand how much to take and what form to take. Work with your health care provider to find out whether it is safe and beneficial for you to take aspirin daily. Ask your health care provider if you need to take a cholesterol-lowering medicine (statin). Find healthy ways to manage stress, such as: Meditation, yoga, or listening to music. Journaling. Talking to a trusted person. Spending time with friends and family. Minimize exposure to UV radiation to reduce your risk of skin cancer. Safety Always wear your seat belt while driving or riding in a vehicle. Do not drive: If you have been drinking alcohol. Do not ride with someone who has been drinking. When you are tired or distracted. While texting. If you have been using any mind-altering substances or drugs. Wear a helmet and other protective equipment during sports activities. If you have firearms in your house, make sure you follow all gun safety procedures. What's next? Visit your health care provider once a year for an annual wellness visit. Ask your health care provider how often you should have your eyes  and teeth checked. Stay up to date on all vaccines. This information is not intended to replace advice given to you by your health care provider. Make sure you discuss any questions you have with your health care provider. Document Revised: 04/24/2021 Document Reviewed: 04/24/2021 Elsevier Patient Education  2024 [employer]. Understanding Your Risk for Falls Millions of people have serious injuries from falls each year. It is important to understand your risk of falling. Talk with your health care provider about your risk and what you can do to lower it. If you do have a serious fall, make sure  to tell your provider. Falling once raises your risk of falling again. How can falls affect me? Serious injuries from falls are common. These include: Broken bones, such as hip fractures. Head injuries, such as traumatic brain injuries (TBI) or concussions. A fear of falling can cause you to avoid activities and stay at home. This can make your muscles weaker and raise your risk for a fall. What can increase my risk? There are a number of risk factors that increase your risk for falling. The more risk factors you have, the higher your risk of falling. Serious injuries from a fall happen most often to people who are older than 74 years old. Teenagers and young adults ages 53-29 are also at higher risk. Common risk factors include: Weakness in the lower body. Being generally weak or confused due to long-term (chronic) illness. Dizziness or balance problems. Poor vision. Medicines that cause dizziness or drowsiness. These may include: Medicines for your blood pressure, heart, anxiety, insomnia, or swelling (edema). Pain medicines. Muscle relaxants. Other risk factors include: Drinking alcohol. Having had a fall in the past. Having foot pain or wearing improper footwear. Working at a dangerous job. Having any of the following in your home: Tripping hazards, such as floor clutter or loose rugs. Poor lighting. Pets. Having dementia or memory loss. What actions can I take to lower my risk of falling?     Physical activity Stay physically fit. Do strength and balance exercises. Consider taking a regular class to build strength and balance. Yoga and tai chi are good options. Vision Have your eyes checked every year and your prescription for glasses or contacts updated as needed. Shoes and walking aids Wear non-skid shoes. Wear shoes that have rubber soles and low heels. Do not wear high heels. Do not walk around the house in socks or slippers. Use a cane or walker as told by your  provider. Home safety Attach secure railings on both sides of your stairs. Install grab bars for your bathtub, shower, and toilet. Use a non-skid mat in your bathtub or shower. Attach bath mats securely with double-sided, non-slip rug tape. Use good lighting in all rooms. Keep a flashlight near your bed. Make sure there is a clear path from your bed to the bathroom. Use night-lights. Do not use throw rugs. Make sure all carpeting is taped or tacked down securely. Remove all clutter from walkways and stairways, including extension cords. Repair uneven or broken steps and floors. Avoid walking on icy or slippery surfaces. Walk on the grass instead of on icy or slick sidewalks. Use ice melter to get rid of ice on walkways in the winter. Use a cordless phone. Questions to ask your health care provider Can you help me check my risk for a fall? Do any of my medicines make me more likely to fall? Should I take a vitamin D supplement? What exercises can I  do to improve my strength and balance? Should I make an appointment to have my vision checked? Do I need a bone density test to check for weak bones (osteoporosis)? Would it help to use a cane or a walker? Where to find more information Centers for Disease Control and Prevention, STEADI: TonerPromos.no Community-Based Fall Prevention Programs: TonerPromos.no [employer] on Aging: BaseRingTones.pl Contact a health care provider if: You fall at home. You are afraid of falling at home. You feel weak, drowsy, or dizzy. This information is not intended to replace advice given to you by your health care provider. Make sure you discuss any questions you have with your health care provider. Document Revised: 06/30/2022 Document Reviewed: 06/30/2022 Elsevier Patient Education  2024 [employer].

## 2023-07-07 ENCOUNTER — Telehealth: Payer: Self-pay | Admitting: *Deleted

## 2023-07-07 NOTE — Progress Notes (Signed)
  Care Coordination   Note   07/07/2023 Name: [name] MRN: [id number] DOB: Aug 11, 1951  [name] is a 72 y.o. year old female who sees Lodema Hong, Milus Mallick, MD for primary care. I reached out to [name] by phone today to offer care coordination services.  Ms. Diguglielmo was given information about Care Coordination services today including:   The Care Coordination services include support from the care team which includes your Nurse Coordinator, Clinical Social Worker, or Pharmacist.  The Care Coordination team is here to help remove barriers to the health concerns and goals most important to you. Care Coordination services are voluntary, and the patient may decline or stop services at any time by request to their care team member.   Care Coordination Consent Status: Patient agreed to services and verbal consent obtained.   Follow up plan:  Telephone appointment with care coordination team member scheduled for:  07/08/23  Encounter Outcome:  Pt. Scheduled  [facility] Coordination Care Guide  Direct Dial: [phone]

## 2023-07-08 ENCOUNTER — Encounter: Payer: Self-pay | Admitting: Dermatology

## 2023-07-08 ENCOUNTER — Ambulatory Visit: Payer: Self-pay

## 2023-07-08 NOTE — Patient Instructions (Signed)
Visit Information  Thank you for taking time to visit with me today. Please don't hesitate to contact me if I can be of assistance to you.   Following are the goals we discussed today:  Patient agreed to contact resources for ramp, hand rails and tub remodel.   Our next appointment is by telephone on 07/22/23 at 07/22/23 at 10:30am  Please call the care guide team at [phone] if you need to cancel or reschedule your appointment.   If you are experiencing a Mental Health or Behavioral Health Crisis or need someone to talk to, please call 911  Patient verbalizes understanding of instructions and care plan provided today and agrees to view in MyChart. Active MyChart status and patient understanding of how to access instructions and care plan via MyChart confirmed with patient.     Telephone follow up appointment with care management team member scheduled for: 07/22/23 at 10:30am.  Lysle Morales, BSW Social Worker [phone]

## 2023-07-08 NOTE — Patient Outreach (Signed)
  Care Coordination   Initial Visit Note   07/08/2023 Name: [name] MRN: [id number] DOB: 07-Jan-1951  [name] is a 72 y.o. year old female who sees [name], MD for primary care. I spoke with  [name] by phone today.  What matters to the patients health and wellness today?  Patient request assistance with ramp, ramp hand rails and renovate tub.   Goals Addressed             This Visit's Progress    Ramp and home repairs       Interventions Today    Flowsheet Row Most Recent Value  Chronic Disease   Chronic disease during today's visit Hypertension (HTN), Chronic Obstructive Pulmonary Disease (COPD)  General Interventions   General Interventions Discussed/Reviewed General Interventions Discussed, General Interventions Reviewed, Communication with, [employer] reports she wants tub replaced, additional ramp to her back door and hand rails on the ramp at the front door. Pt reports on the waiting list for [facility] for ramp/repairs.  SW provides # for [employer]. T/c [facility] left vm.]              SDOH assessments and interventions completed:  Yes  SDOH Interventions Today    Flowsheet Row Most Recent Value  SDOH Interventions   Food Insecurity Interventions Intervention Not Indicated, Other (Comment)  [Receives Food stamps]  Housing Interventions Intervention Not Indicated  Transportation Interventions Intervention Not Indicated, Other (Comment)  [Has a car and medical transportation]  Utilities Interventions Intervention Not Indicated        Care Coordination Interventions:  Yes, provided   Follow up plan: Follow up call scheduled for 07/22/23 at 10:30am    Encounter Outcome:  Pt. Visit Completed

## 2023-07-09 ENCOUNTER — Telehealth: Payer: Self-pay

## 2023-07-09 NOTE — Telephone Encounter (Signed)
Pt sent Mychart msg  Concern: large knot at groin incision site  Location: Left groin  Description:  been noticeable for past couple of days, but was unable to locate information to call office  Quality: pressure  Procedure: Interventional Vascular Procedure  Resolution: Instructed patient to proceed to nearest ER, which is [facility], to r/o hematoma/pseudoaneurysm. Pt stated she would go to her PCP and then go to [facility] if necessary.

## 2023-07-09 NOTE — Telephone Encounter (Signed)
Pt appt scheduled

## 2023-07-10 ENCOUNTER — Emergency Department (HOSPITAL_COMMUNITY)
Admission: EM | Admit: 2023-07-10 | Discharge: 2023-07-10 | Disposition: A | Discharge status: Home / Self Care | Payer: 59

## 2023-07-10 ENCOUNTER — Other Ambulatory Visit: Payer: Self-pay

## 2023-07-10 ENCOUNTER — Emergency Department (HOSPITAL_COMMUNITY): Payer: 59

## 2023-07-10 ENCOUNTER — Encounter (HOSPITAL_COMMUNITY): Payer: Self-pay

## 2023-07-10 DIAGNOSIS — K5730 Diverticulosis of large intestine without perforation or abscess without bleeding: Secondary | ICD-10-CM | POA: Insufficient documentation

## 2023-07-10 DIAGNOSIS — I700 Atherosclerosis of aorta: Secondary | ICD-10-CM | POA: Diagnosis not present

## 2023-07-10 DIAGNOSIS — T82847A Pain from cardiac prosthetic devices, implants and grafts, initial encounter: Secondary | ICD-10-CM | POA: Diagnosis not present

## 2023-07-10 DIAGNOSIS — R1030 Lower abdominal pain, unspecified: Secondary | ICD-10-CM | POA: Diagnosis not present

## 2023-07-10 DIAGNOSIS — S301XXA Contusion of abdominal wall, initial encounter: Secondary | ICD-10-CM | POA: Diagnosis not present

## 2023-07-10 DIAGNOSIS — G8918 Other acute postprocedural pain: Secondary | ICD-10-CM | POA: Insufficient documentation

## 2023-07-10 DIAGNOSIS — I10 Essential (primary) hypertension: Secondary | ICD-10-CM | POA: Diagnosis not present

## 2023-07-10 DIAGNOSIS — I701 Atherosclerosis of renal artery: Secondary | ICD-10-CM | POA: Diagnosis not present

## 2023-07-10 LAB — CBC WITH DIFFERENTIAL/PLATELET
Abs Immature Granulocytes: 0.04 10*3/uL (ref 0.00–0.07)
Basophils Absolute: 0.1 10*3/uL (ref 0.0–0.1)
Basophils Relative: 1 %
Eosinophils Absolute: 0.2 10*3/uL (ref 0.0–0.5)
Eosinophils Relative: 2 %
HCT: 36.6 % (ref 36.0–46.0)
Hemoglobin: 11.7 g/dL — ABNORMAL LOW (ref 12.0–15.0)
Immature Granulocytes: 0 %
Lymphocytes Relative: 20 %
Lymphs Abs: 2 10*3/uL (ref 0.7–4.0)
MCH: 30.7 pg (ref 26.0–34.0)
MCHC: 32 g/dL (ref 30.0–36.0)
MCV: 96.1 fL (ref 80.0–100.0)
Monocytes Absolute: 1.2 10*3/uL — ABNORMAL HIGH (ref 0.1–1.0)
Monocytes Relative: 11 %
Neutro Abs: 6.6 10*3/uL (ref 1.7–7.7)
Neutrophils Relative %: 66 %
Platelets: 332 10*3/uL (ref 150–400)
RBC: 3.81 MIL/uL — ABNORMAL LOW (ref 3.87–5.11)
RDW: 13.8 % (ref 11.5–15.5)
WBC: 10.2 10*3/uL (ref 4.0–10.5)
nRBC: 0 % (ref 0.0–0.2)

## 2023-07-10 LAB — COMPREHENSIVE METABOLIC PANEL
ALT: 28 U/L (ref 0–44)
AST: 36 U/L (ref 15–41)
Albumin: 3.7 g/dL (ref 3.5–5.0)
Alkaline Phosphatase: 62 U/L (ref 38–126)
Anion gap: 10 (ref 5–15)
BUN: 13 mg/dL (ref 8–23)
CO2: 25 mmol/L (ref 22–32)
Calcium: 8.9 mg/dL (ref 8.9–10.3)
Chloride: 103 mmol/L (ref 98–111)
Creatinine, Ser: 0.6 mg/dL (ref 0.44–1.00)
GFR, Estimated: >60 mL/min (ref >60)
Glucose, Bld: 88 mg/dL (ref 70–99)
Potassium: 3.8 mmol/L (ref 3.5–5.1)
Sodium: 138 mmol/L (ref 135–145)
Total Bilirubin: 0.5 mg/dL (ref 0.3–1.2)
Total Protein: 7.1 g/dL (ref 6.5–8.1)

## 2023-07-10 MED ORDER — IOHEXOL 350 MG/ML SOLN
100.0000 mL | Freq: Once | INTRAVENOUS | Status: AC | PRN
  Administered 2023-07-10: 100 mL via INTRAVENOUS

## 2023-07-10 NOTE — ED Notes (Signed)
Pt leaving at this time.

## 2023-07-10 NOTE — ED Triage Notes (Signed)
8/22 pt had abd aortogram B/L Increased pain to LEFT groin Bruising across ABD and down both legs.  Hard lump to LEFT groin  Pt denies numbness in feet.

## 2023-07-10 NOTE — ED Notes (Signed)
Pt wanting to leave, states needs to get home to pets.  Pt requesting IV be taken out.  RN convinced patient to leave in IV until CT scan results back, pt agrees at this time.

## 2023-07-10 NOTE — ED Notes (Signed)
Pt up to use restroom after returning from CT.  Pt ambulatory with steady gait.  Pt refusing to having monitoring cords hooked up at this time.

## 2023-07-10 NOTE — ED Notes (Signed)
Patient transported to CT 

## 2023-07-10 NOTE — ED Provider Notes (Signed)
[place] EMERGENCY DEPARTMENT AT [facility] Provider Note   CSN: [id number] Arrival date & time: 07/10/23  1407     History  Chief Complaint  Patient presents with   Post-op Problem    [name] is a 72 y.o. female.  72 year old presenting emergency department with complaint of bruising and lump to groin after recent aorta/iliac stenting.  Reports that lump to her left groin was larger several days ago, but is still present.  She states that the bruising has worsened across her lower abdomen and she is concerned that she has internal bleeding.  Denies any fevers, chills, chest pain no overt abdominal pain.  No nausea no vomiting.  Reports no numbness tingling changes in sensation or lower extremities.  Reports that she is currently ambulatory and having improved function after procedure.        Home Medications Prior to Admission medications   Medication Sig Start Date End Date Taking? Authorizing Provider  Biotin w/ Vitamins C & E 1250-7.5-7.5 MCG-MG-UNT CHEW Chew 1 tablet by mouth daily.    [provider]  BUSPIRONE HCL PO Take 1 tablet by mouth in the morning and at bedtime.    [provider]  carisoprodol (SOMA) 350 MG tablet Take 350 mg by mouth at bedtime.    [provider]  cetirizine (ZYRTEC) 10 MG tablet Take 1 tablet (10 mg total) daily by mouth. Patient taking differently: Take 10 mg by mouth daily as needed for allergies. 09/16/17   [name], MD  clopidogrel (PLAVIX) 75 MG tablet Take 37.5 mg by mouth in the morning.    [provider]  dicyclomine (BENTYL) 10 MG capsule Take one capsulr by mouth three times daily as needed,  for abdominal spasm 08/15/22   [name], MD  doxycycline (PERIOSTAT) 20 MG tablet Take 20 mg by mouth 2 (two) times daily as needed (itchy eyes.).    [provider]  doxycycline (VIBRA-TABS) 100 MG tablet Take 1 tablet (100 mg total) by mouth 2 (two) times  daily. 07/01/23   [name], MD  EPINEPHRINE 0.3 mg/0.3 mL IJ SOAJ injection Inject 0.3 mLs (0.3 mg total) into the muscle once for 1 dose. 05/27/21   [name], MD  EPINEPHrine 0.3 mg/0.3 mL IJ SOAJ injection Inject 0.3 mg into the muscle as needed for anaphylaxis.    [provider]  ezetimibe (ZETIA) 10 MG tablet Take 1 tablet (10 mg total) by mouth daily. Patient taking differently: Take 10 mg by mouth at bedtime. 02/20/23   [name], MD  flecainide (TAMBOCOR) 150 MG tablet Take 150 mg by mouth 2 (two) times daily.    [provider]  hydrALAZINE (APRESOLINE) 25 MG tablet Take 1 tablet (25 mg total) by mouth 3 (three) times daily. 02/02/23 01/28/24  Amanda Stade, MD  HYDROcodone-acetaminophen (NORCO) 10-325 MG tablet Take 1 tablet by mouth 4 (four) times daily. 06/25/23   [provider]  HYDROcodone-acetaminophen (NORCO) 7.5-325 MG tablet Take 1 tablet by mouth 3 (three) times daily as needed for severe pain or moderate pain.    [provider]  Melatonin 10 MG TBDP Take 10 mg by mouth at bedtime.    [provider]  methocarbamol (ROBAXIN) 750 MG tablet Take 750-1,500 mg by mouth at bedtime.    [provider]  metoprolol succinate (TOPROL-XL) 25 MG 24 hr tablet Take 25 mg by mouth daily.    [provider]  metoprolol succinate (TOPROL-XL) 25 MG 24 hr tablet Take 12.5 mg by mouth daily.    [provider]  omeprazole (PRILOSEC) 40 MG capsule Take 1 capsule (40 mg total) by mouth daily. Patient taking differently: Take 40 mg by mouth daily as needed (acid reflux.). 10/28/22   [name], MD  OXYGEN Inhale 2 L into the lungs at bedtime.    [provider]  pantoprazole (PROTONIX) 40 MG tablet Take 40 mg by mouth daily.    [provider]  PAROXETINE HCL PO Take 1 capsule by mouth daily.    [provider]  rosuvastatin (CRESTOR) 10 MG tablet Take 10 mg  by mouth daily.    [provider]  rosuvastatin (CRESTOR) 20 MG tablet Take 1 tablet (20 mg total) by mouth daily. 04/02/23 10/23/26  Amanda Stade, MD  Suvorexant (BELSOMRA) 5 MG TABS Take 1 tablet by mouth at bedtime.    [provider]  tiZANidine (ZANAFLEX) 4 MG tablet Take 4 mg by mouth every 6 (six) hours as needed for muscle spasms.    [provider]  traZODone (DESYREL) 50 MG tablet Take 50 mg by mouth at bedtime.    [provider]  valsartan (DIOVAN) 320 MG tablet TAKE 1 TABLET EVERY DAY (DOSE CHANGE) 02/20/23   [name], MD  Varenicline Tartrate 0.03 MG/ACT SOLN Place 1 spray into both nostrils daily. 09/10/21   [name], MD  Vitamin D, Ergocalciferol, (DRISDOL) 1.25 MG (50000 UNIT) CAPS capsule Take 1 capsule (50,000 Units total) by mouth every 7 (seven) days. Patient taking differently: Take 50,000 Units by mouth every Saturday. 02/20/23   [name], MD      Allergies    Aspirin, Diphenhydramine hcl, Fluorometholone, Hydroxyzine, Meloxicam, Methocarbamol, Metronidazole, Morphine, Poison sumac extract, Salicin, Salix species, Tramadol hcl, Willow bark [white willow bark], Willow leaf swallow wort rhizome, Amlodipine, Codeine, Diphenhydramine hcl, Duloxetine hcl, Hydrocortisone, Hydroxyzine hcl, Morphine sulfate, Other, Oxycodone, Oxycodone hcl, Pneumococcal vaccines, Prednisone, Promethazine, Promethazine hcl, Tiotropium bromide monohydrate, Tramadol, Tramadol hcl, Bupropion, Cocoa butter, Glycerin, Mineral oil, Petrolatum, Phenylephrine hcl, Phenylephrine hcl (pressors), Preparation h [lidocaine-glycerin], and Promethazine hcl    Review of Systems   Review of Systems  Physical Exam Updated Vital Signs BP (!) 142/84 (BP Location: Right Arm)   Pulse 79   Temp 98.6 F (37 C)   Resp 18   Ht 5\' 3"  (1.6 m)   Wt 59 kg   SpO2 95%   BMI 23.03 kg/m  Physical Exam Vitals and nursing note reviewed. Exam conducted with  a chaperone present.  HENT:     Head: Normocephalic and atraumatic.     Mouth/Throat:     Mouth: Mucous membranes are moist.  Cardiovascular:     Rate and Rhythm: Normal rate and regular rhythm.  Pulmonary:     Effort: Pulmonary effort is normal.     Breath sounds: Normal breath sounds.  Abdominal:     General: Abdomen is flat. There is no distension.     Tenderness: There is no abdominal tenderness. There is no guarding or rebound.  Genitourinary:    Comments: Patient has some diffuse bruising and ecchymosis bilateral groin and across suprapubic bone.  She has a small mass to the left groin, nontender. Musculoskeletal:     Comments: Normal sensation in lower extremities.  Warm well-perfused.  2+ DP pulses bilaterally.  Skin:    Capillary Refill: Capillary refill takes less than 2 seconds.  Neurological:  Mental Status: She is alert.     Gait: Gait normal.  Psychiatric:        Mood and Affect: Mood normal.        Behavior: Behavior normal.     ED Results / Procedures / Treatments   Labs (all labs ordered are listed, but only abnormal results are displayed) Labs Reviewed  CBC WITH DIFFERENTIAL/PLATELET - Abnormal; Notable for the following components:      Result Value   RBC 3.81 (*)    Hemoglobin 11.7 (*)    Monocytes Absolute 1.2 (*)    All other components within normal limits  COMPREHENSIVE METABOLIC PANEL    EKG EKG Interpretation Date/Time:  Friday July 10 2023 14:20:36 EDT Ventricular Rate:  86 PR Interval:  144 QRS Duration:  70 QT Interval:  338 QTC Calculation: 404 R Axis:   69  Text Interpretation: Sinus rhythm with Premature supraventricular complexes Otherwise normal ECG When compared with ECG of 28-Apr-2023 15:09, Premature supraventricular complexes are now Present Confirmed by Vonita Moss [phone]) on 07/10/2023 4:13:59 PM  Radiology CT Angio Abd/Pel W and/or Wo Contrast  Result Date: 07/10/2023 CLINICAL DATA:  Provided history: Aneurysm,  renal or visceral recent iliac stenting, pain in groin and lower abd. Patient had aortogram 07/02/2023 with increased pain in the left groin. Abdominal bruising and down both legs. EXAM: CTA ABDOMEN AND PELVIS WITHOUT AND WITH CONTRAST TECHNIQUE: Multidetector CT imaging of the abdomen and pelvis was performed using the standard protocol during bolus administration of intravenous contrast. Multiplanar reconstructed images and MIPs were obtained and reviewed to evaluate the vascular anatomy. RADIATION DOSE REDUCTION: This exam was performed according to the departmental dose-optimization program which includes automated exposure control, adjustment of the mA and/or kV according to patient size and/or use of iterative reconstruction technique. CONTRAST:  OMNIPAQUE IOHEXOL 350 MG/ML SOLN COMPARISON:  Abdominal CT 02/25/2021 FINDINGS: VASCULAR Aorta: Densely calcified. No aneurysm, dissection, vasculitis or severe stenosis. Celiac: Patent without evidence of aneurysm, dissection, vasculitis or significant stenosis. SMA: Patent without evidence of aneurysm, dissection, vasculitis or significant stenosis. Renals: Single bilateral renal arteries. Minimal stenosis at the origin of both renal arteries due to calcified plaque. No dissection, aneurysm or acute findings. IMA: Patent without evidence of aneurysm, dissection, vasculitis or significant stenosis. Inflow: Aorto bi-iliac stents, stents terminate in the common iliac arteries. Stents are patent. Both common, internal and external iliac arteries are patent with moderate plaque. Proximal Outflow: Hyperdense fluid collection adjacent to the right external iliac vessels measures 2.6 x 1.5 cm. There is no filling of contrast on arterial phase imaging. No active extravasation. There is adjacent stranding. No true aneurysm. Mild stranding adjacent to the left groin vasculature but no focal fluid collection. The included common femoral and superficial femoral arteries  are patent. Veins: No obvious venous abnormality. Iliac veins are patent. Portal, splenic, hepatic and renal veins are patent. The IVC is patent. Review of the MIP images confirms the above findings. 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 No basilar airspace disease or pleural effusion. Hepatobiliary: No focal liver abnormality is seen. No gallstones, gallbladder wall thickening, or biliary dilatation. Pancreas: Parenchymal atrophy. No ductal dilatation or inflammation. Spleen: Normal in size without focal abnormality. Adrenals/Urinary Tract: Normal adrenal glands. No hydronephrosis or focal renal abnormality. Minimal scarring in the upper pole of the left kidney. No visible renal calculi. Decompressed urinary bladder. Stomach/Bowel: No bowel obstruction or inflammation. Colonic diverticulosis without diverticulitis. Chain sutures in the sigmoid. The  appendix is not definitively seen. Lymphatic: No adenopathy. Reproductive: Status post hysterectomy. No adnexal masses. Other: Stranding within both groins as well as lower abdominal subcutaneous tissues. No intra-abdominopelvic collection. No ascites. No free air. Musculoskeletal: Moderate lumbar degenerative change. There are no acute or suspicious osseous abnormalities. IMPRESSION: VASCULAR 1. Fluid collection adjacent to the right external iliac vessels measuring 2.6 x 1.5 cm. This is favored to represent hematoma. No aneurysm or active extravasation. 2. Stranding adjacent to the left groin vascular structures without focal fluid collection. 3. Aorto bi-iliac stents which are patent. Densely calcified abdominal aorta. Aortic Atherosclerosis (ICD10-I70.0). NON-VASCULAR 1. No acute nonvascular finding. 2. Colonic diverticulosis without diverticulitis. Electronically Signed   By: Narda Rutherford M.D.   On: 07/10/2023 21:08    Procedures Procedures    Medications Ordered in ED Medications  iohexol (OMNIPAQUE) 350  MG/ML injection 100 mL (100 mLs Intravenous Contrast Given 07/10/23 2004)    ED Course/ Medical Decision Making/ A&P Clinical Course as of 07/10/23 2333  Fri Jul 10, 2023  2114 CT Angio Abd/Pel W and/or Wo Contrast VASCULAR  1. Fluid collection adjacent to the right external iliac vessels measuring 2.6 x 1.5 cm. This is favored to represent hematoma. No aneurysm or active extravasation. 2. Stranding adjacent to the left groin vascular structures without focal fluid collection. 3. Aorto bi-iliac stents which are patent. Densely calcified abdominal aorta. Aortic Atherosclerosis (ICD10-I70.0).  NON-VASCULAR  1. No acute nonvascular finding. 2. Colonic diverticulosis without diverticulitis.   [TY]    Clinical Course User Index [TY] Coral Spikes, DO                                 Medical Decision Making Presenting emergency department for evaluation of bruising across abdomen and lump in groin after aortic stenting.  She is afebrile vital signs reassuring hemodynamically stable.  Physical exam with soft nontender abdomen she has minor anemia, but no other significant electrolyte abnormalities on her comprehensive panel.  Normal kidney function.  No transaminitis to suggest hepatobiliary disease.  Given recent procedure CTA angiogram ordered appears to have no acute pathology.  Lump in groin is likely hematoma there is some stranding in the area, but suspect likely reactionary.  Patient eloped prior to reevaluation.  Amount and/or Complexity of Data Reviewed External Data Reviewed:     Details: Uncomplicated vascular pair with Dr. Chestine Spore on 822 for Atherosclerotic peripheral vascular disease with intermittent claudication.  Labs: ordered. Decision-making details documented in ED Course. Radiology: ordered. Decision-making details documented in ED Course. ECG/medicine tests:     Details: No ST segment changes to indicate ischemia.  Risk Prescription drug  management.          Final Clinical Impression(s) / ED Diagnoses Final diagnoses:  None    Rx / DC Orders ED Discharge Orders     None         Coral Spikes, DO 07/10/23 2333

## 2023-07-10 NOTE — ED Notes (Signed)
Pt wanting IV taken out and needing to leave.  IV removed per request.  CT scan results available and MD notified of patient requesting to leave, states will attempt to get to patient with update soon.

## 2023-07-14 ENCOUNTER — Telehealth: Payer: Self-pay | Admitting: Family Medicine

## 2023-07-14 NOTE — Telephone Encounter (Signed)
Pt called in regard to recent surgery. Patient is having a hard  time getting in contact with whom done the surgery for her .   Patient  has surface bleeding around stents from surgery, patient is concerned wants a  call back in regard.

## 2023-07-15 ENCOUNTER — Telehealth: Payer: Self-pay

## 2023-07-15 NOTE — Telephone Encounter (Signed)
[name] at [facility] called stating that the pt contacted them because she was unable to get through to this office. The pt had a concern of bleeding around her stents.  Reviewed pt's chart, returned call for clarification, no answer, lf vm.  Pt went to ED as directed on 8/30, non-concerning hematoma, pt hemodynamically stable, left AMA.

## 2023-07-15 NOTE — Telephone Encounter (Signed)
[id number]-KG 

## 2023-07-22 ENCOUNTER — Ambulatory Visit: Payer: Self-pay

## 2023-07-22 ENCOUNTER — Ambulatory Visit: Payer: Medicare HMO | Admitting: Dermatology

## 2023-07-22 NOTE — Patient Outreach (Signed)
  Care Coordination   07/22/2023 Name: [name] MRN: [id number] DOB: 30-Dec-1950   Care Coordination Outreach Attempts:  An unsuccessful telephone outreach was attempted for a scheduled appointment today.  Follow Up Plan:  Additional outreach attempts will be made to offer the patient care coordination information and services.   Encounter Outcome:  No Answer   Care Coordination Interventions:  No, not indicated    Lysle Morales, BSW Social Worker [phone]

## 2023-07-23 DIAGNOSIS — I471 Supraventricular tachycardia, unspecified: Secondary | ICD-10-CM | POA: Diagnosis not present

## 2023-07-23 DIAGNOSIS — I482 Chronic atrial fibrillation, unspecified: Secondary | ICD-10-CM | POA: Diagnosis not present

## 2023-07-23 DIAGNOSIS — M19071 Primary osteoarthritis, right ankle and foot: Secondary | ICD-10-CM | POA: Diagnosis not present

## 2023-07-23 DIAGNOSIS — Z79899 Other long term (current) drug therapy: Secondary | ICD-10-CM | POA: Diagnosis not present

## 2023-07-29 ENCOUNTER — Other Ambulatory Visit: Payer: Self-pay | Admitting: *Deleted

## 2023-07-29 DIAGNOSIS — J432 Centrilobular emphysema: Secondary | ICD-10-CM | POA: Diagnosis not present

## 2023-07-29 DIAGNOSIS — I70219 Atherosclerosis of native arteries of extremities with intermittent claudication, unspecified extremity: Secondary | ICD-10-CM

## 2023-07-30 ENCOUNTER — Telehealth: Payer: Self-pay | Admitting: Family Medicine

## 2023-07-30 NOTE — Telephone Encounter (Signed)
Patient called in wants to speak with provider . States that she is having complications from surgery.  Has to be somewhere this evening by 5 so would like a call back before 4pm.

## 2023-07-31 NOTE — Telephone Encounter (Signed)
[id number]-KG 

## 2023-08-03 ENCOUNTER — Ambulatory Visit (INDEPENDENT_AMBULATORY_CARE_PROVIDER_SITE_OTHER): Payer: 59 | Admitting: Internal Medicine

## 2023-08-03 ENCOUNTER — Encounter: Payer: Self-pay | Admitting: Internal Medicine

## 2023-08-03 VITALS — BP 138/78 | HR 93 | Ht 63.0 in | Wt 131.8 lb

## 2023-08-03 DIAGNOSIS — R195 Other fecal abnormalities: Secondary | ICD-10-CM | POA: Diagnosis not present

## 2023-08-03 NOTE — Patient Instructions (Signed)
It was a pleasure to see you today.  Thank you for giving [place] the opportunity to be involved in your care.  Below is a brief recap of your visit and next steps.  We will plan to see you again in December.  Summary GI referral placed Labs ordered Follow up with Dr. Lodema Hong in December.

## 2023-08-03 NOTE — Assessment & Plan Note (Signed)
Patient presenting today for an acute visit for a change in stool quality as described above.  This has occurred since undergoing surgery 1 month ago in the setting of disabling claudication of the right lower extremity.  She endorses loose stools that are darker in color.  She experiences diffuse abdominal pain after meals and has to defecate within 30 minutes.  She has not identified any alleviating factors.  She has not tried taking any medications for symptom relief.  There is mild abdominal tenderness to palpation diffusely on exam but otherwise no acute findings. -Gastroenterology referral placed at patient's request -Stool studies ordered today to evaluate for infection or bleeding -Check CBC and iron studies as well.  Of note, Hgb decreased to 11.7 from 13.3 previously when checked on 8/30.  I suspect this was secondary to recent surgery and hematoma formation, but cannot exclude GI bleed given reports of dark-colored stools. -Further workup pending initial lab results.  She is scheduled for routine follow-up with Dr. Lodema Hong in December.

## 2023-08-03 NOTE — Progress Notes (Signed)
Acute Office Visit  Subjective:     Patient ID: [name], female    DOB: September 17, 1951, 72 y.o.   MRN: [id number]  Chief Complaint  Patient presents with   Follow-up    Sx to clean out blockages upset stomach bleeding for 12 days after sx, requesting referral to GI    [name] presents for an acute visit today with GI concerns.  She underwent aortogram with right lower extremity arteriogram and stenting on 8/22.  Since that time she describes generalized abdominal pain and a change in stool quality.  She states that stools are loose but not watery.  She states that she has bowel movements within 30 minutes of eating and can feel food boluses migrating through her GI tract.  She has not identified any exacerbating factors aside from eating, and has not identified any alleviating factors.  She presented to the emergency department on 8/30 due to ecchymoses noted along her abdominal wall and groin.  CTA of the abdomen demonstrated a fluid collection adjacent to the right external iliac vessels, consistent with hematoma.  [name] is concerned that intra-abdominal bleeding has irritated her GI tract and is causing her pain.  She requests a referral to gastroenterology for further evaluation.  Review of Systems  Gastrointestinal:  Positive for abdominal pain. Negative for blood in stool, melena, nausea and vomiting.       Increase frequency of stools and change in stool quality      Objective:    BP 138/78 (BP Location: Right Arm, Patient Position: Sitting, Cuff Size: Normal)   Pulse 93   Ht 5\' 3"  (1.6 m)   Wt 131 lb 12.8 oz (59.8 kg)   SpO2 91%   BMI 23.35 kg/m   Physical Exam Vitals reviewed.  Constitutional:      General: She is not in acute distress.    Appearance: Normal appearance. She is not toxic-appearing.  HENT:     Head: Normocephalic and atraumatic.     Right Ear: External ear normal.     Left Ear: External ear normal.     Nose: Nose normal. No congestion or  rhinorrhea.     Mouth/Throat:     Mouth: Mucous membranes are moist.     Pharynx: Oropharynx is clear. No oropharyngeal exudate or posterior oropharyngeal erythema.  Eyes:     General: No scleral icterus.    Extraocular Movements: Extraocular movements intact.     Conjunctiva/sclera: Conjunctivae normal.     Pupils: Pupils are equal, round, and reactive to light.  Cardiovascular:     Rate and Rhythm: Normal rate and regular rhythm.     Pulses: Normal pulses.     Heart sounds: Normal heart sounds. No murmur heard.    No friction rub. No gallop.  Pulmonary:     Effort: Pulmonary effort is normal.     Breath sounds: Normal breath sounds. No wheezing, rhonchi or rales.  Abdominal:     General: Abdomen is flat. Bowel sounds are normal. There is no distension.     Palpations: Abdomen is soft.     Tenderness: There is abdominal tenderness (Diffuse abdominal 'soreness' to palpation).  Musculoskeletal:        General: No swelling. Normal range of motion.     Cervical back: Normal range of motion.     Right lower leg: No edema.     Left lower leg: No edema.  Lymphadenopathy:     Cervical: No cervical adenopathy.  Skin:  General: Skin is warm and dry.     Capillary Refill: Capillary refill takes less than 2 seconds.     Coloration: Skin is not jaundiced.  Neurological:     General: No focal deficit present.     Mental Status: She is alert and oriented to person, place, and time.  Psychiatric:        Mood and Affect: Mood normal.        Behavior: Behavior normal.       Assessment & Plan:   Problem List Items Addressed This Visit       Change in consistency of stool - Primary    Patient presenting today for an acute visit for a change in stool quality as described above.  This has occurred since undergoing surgery 1 month ago in the setting of disabling claudication of the right lower extremity.  She endorses loose stools that are darker in color.  She experiences diffuse  abdominal pain after meals and has to defecate within 30 minutes.  She has not identified any alleviating factors.  She has not tried taking any medications for symptom relief.  There is mild abdominal tenderness to palpation diffusely on exam but otherwise no acute findings. -Gastroenterology referral placed at patient's request -Stool studies ordered today to evaluate for infection or bleeding -Check CBC and iron studies as well.  Of note, Hgb decreased to 11.7 from 13.3 previously when checked on 8/30.  I suspect this was secondary to recent surgery and hematoma formation, but cannot exclude GI bleed given reports of dark-colored stools. -Further workup pending initial lab results.  She is scheduled for routine follow-up with Dr. Lodema Hong in December.      Return if symptoms worsen or fail to improve.  Billie Lade, MD

## 2023-08-04 ENCOUNTER — Encounter (INDEPENDENT_AMBULATORY_CARE_PROVIDER_SITE_OTHER): Payer: Self-pay | Admitting: *Deleted

## 2023-08-04 DIAGNOSIS — R195 Other fecal abnormalities: Secondary | ICD-10-CM | POA: Diagnosis not present

## 2023-08-05 DIAGNOSIS — R195 Other fecal abnormalities: Secondary | ICD-10-CM | POA: Diagnosis not present

## 2023-08-06 ENCOUNTER — Ambulatory Visit (INDEPENDENT_AMBULATORY_CARE_PROVIDER_SITE_OTHER)
Admission: RE | Admit: 2023-08-06 | Discharge: 2023-08-06 | Disposition: A | Discharge status: Home / Self Care | Payer: 59 | Source: Ambulatory Visit | Attending: Vascular Surgery | Admitting: Vascular Surgery

## 2023-08-06 ENCOUNTER — Ambulatory Visit (HOSPITAL_COMMUNITY)
Admission: RE | Admit: 2023-08-06 | Discharge: 2023-08-06 | Disposition: A | Discharge status: Home / Self Care | Payer: 59 | Source: Ambulatory Visit | Attending: Vascular Surgery | Admitting: Vascular Surgery

## 2023-08-06 DIAGNOSIS — I70219 Atherosclerosis of native arteries of extremities with intermittent claudication, unspecified extremity: Secondary | ICD-10-CM

## 2023-08-06 LAB — GI PROFILE, STOOL, PCR

## 2023-08-06 LAB — IRON,TIBC AND FERRITIN PANEL
Ferritin: 195 ng/mL — ABNORMAL HIGH (ref 15–150)
Iron Saturation: 14 % — ABNORMAL LOW (ref 15–55)
Iron: 39 ug/dL (ref 27–139)
Total Iron Binding Capacity: 276 ug/dL (ref 250–450)
UIBC: 237 ug/dL (ref 118–369)

## 2023-08-06 LAB — VAS US ABI WITH/WO TBI
Left ABI: 1.15
Right ABI: 1.12

## 2023-08-06 LAB — SPECIMEN STATUS REPORT

## 2023-08-07 LAB — CLOSTRIDIUM DIFFICILE BY PCR: Toxigenic C. Difficile by PCR: NEGATIVE

## 2023-08-09 LAB — STOOL CULTURE: E coli, Shiga toxin Assay: NEGATIVE

## 2023-08-10 ENCOUNTER — Encounter (INDEPENDENT_AMBULATORY_CARE_PROVIDER_SITE_OTHER): Payer: Self-pay | Admitting: Gastroenterology

## 2023-08-10 ENCOUNTER — Ambulatory Visit (HOSPITAL_COMMUNITY)
Admission: RE | Admit: 2023-08-10 | Discharge: 2023-08-10 | Disposition: A | Discharge status: Home / Self Care | Payer: 59 | Source: Ambulatory Visit | Attending: Gastroenterology | Admitting: Gastroenterology

## 2023-08-10 ENCOUNTER — Ambulatory Visit (INDEPENDENT_AMBULATORY_CARE_PROVIDER_SITE_OTHER): Payer: 59 | Admitting: Gastroenterology

## 2023-08-10 VITALS — BP 148/71 | HR 93 | Temp 98.0°F | Ht 63.0 in | Wt 129.5 lb

## 2023-08-10 DIAGNOSIS — D509 Iron deficiency anemia, unspecified: Secondary | ICD-10-CM | POA: Diagnosis not present

## 2023-08-10 DIAGNOSIS — R197 Diarrhea, unspecified: Secondary | ICD-10-CM | POA: Diagnosis not present

## 2023-08-10 DIAGNOSIS — K259 Gastric ulcer, unspecified as acute or chronic, without hemorrhage or perforation: Secondary | ICD-10-CM | POA: Insufficient documentation

## 2023-08-10 MED ORDER — FERROUS SULFATE 325 (65 FE) MG PO TABS: 325.0000 mg | ORAL_TABLET | Freq: Every day | ORAL | 1 refills | Status: DC

## 2023-08-10 NOTE — Patient Instructions (Signed)
Please start once daily iron pill  We will get an xray of your abdomen to rule out constipation as the cause of your diarrhea As discussed, I would recommend we repeat EGD to evaluate for the ulcers you had being healed. Please let me know if you wish to schedule this  Please restart omeprazole 40mg  daily, given your history of ulcers Continue to avoid all NSAIDs  Follow up 1 month
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
## 2023-08-11 ENCOUNTER — Ambulatory Visit (INDEPENDENT_AMBULATORY_CARE_PROVIDER_SITE_OTHER): Payer: 59 | Admitting: Vascular Surgery

## 2023-08-11 ENCOUNTER — Encounter: Payer: Self-pay | Admitting: Vascular Surgery

## 2023-08-11 VITALS — BP 116/69 | HR 89 | Temp 98.0°F | Resp 18 | Ht 63.0 in | Wt 128.3 lb

## 2023-08-11 DIAGNOSIS — I70219 Atherosclerosis of native arteries of extremities with intermittent claudication, unspecified extremity: Secondary | ICD-10-CM

## 2023-08-17 ENCOUNTER — Encounter (INDEPENDENT_AMBULATORY_CARE_PROVIDER_SITE_OTHER): Payer: Self-pay

## 2023-08-17 ENCOUNTER — Encounter (INDEPENDENT_AMBULATORY_CARE_PROVIDER_SITE_OTHER): Payer: Self-pay | Admitting: Gastroenterology

## 2023-08-17 ENCOUNTER — Ambulatory Visit (INDEPENDENT_AMBULATORY_CARE_PROVIDER_SITE_OTHER): Payer: 59 | Admitting: Gastroenterology

## 2023-08-17 VITALS — BP 132/75 | HR 92 | Temp 97.8°F | Ht 63.0 in | Wt 128.5 lb

## 2023-08-17 DIAGNOSIS — R197 Diarrhea, unspecified: Secondary | ICD-10-CM

## 2023-08-17 NOTE — Progress Notes (Signed)
Patient left prior to being seen by provider

## 2023-08-19 ENCOUNTER — Other Ambulatory Visit (INDEPENDENT_AMBULATORY_CARE_PROVIDER_SITE_OTHER): Payer: Self-pay | Admitting: Gastroenterology

## 2023-08-19 DIAGNOSIS — M19071 Primary osteoarthritis, right ankle and foot: Secondary | ICD-10-CM | POA: Diagnosis not present

## 2023-08-19 DIAGNOSIS — R197 Diarrhea, unspecified: Secondary | ICD-10-CM

## 2023-08-19 DIAGNOSIS — J438 Other emphysema: Secondary | ICD-10-CM | POA: Diagnosis not present

## 2023-08-19 DIAGNOSIS — R030 Elevated blood-pressure reading, without diagnosis of hypertension: Secondary | ICD-10-CM | POA: Diagnosis not present

## 2023-08-19 DIAGNOSIS — R634 Abnormal weight loss: Secondary | ICD-10-CM

## 2023-08-20 ENCOUNTER — Encounter: Payer: Self-pay | Admitting: Internal Medicine

## 2023-08-20 ENCOUNTER — Encounter (INDEPENDENT_AMBULATORY_CARE_PROVIDER_SITE_OTHER): Payer: Self-pay

## 2023-08-20 ENCOUNTER — Other Ambulatory Visit: Payer: Self-pay | Admitting: Family Medicine

## 2023-08-20 DIAGNOSIS — R197 Diarrhea, unspecified: Secondary | ICD-10-CM | POA: Diagnosis not present

## 2023-08-20 DIAGNOSIS — R634 Abnormal weight loss: Secondary | ICD-10-CM | POA: Diagnosis not present

## 2023-08-20 NOTE — Telephone Encounter (Signed)
The orders are replaced, please come by the office and pick the orders up and take them to your labs. I will have them at the front desk for you.

## 2023-08-21 ENCOUNTER — Other Ambulatory Visit (INDEPENDENT_AMBULATORY_CARE_PROVIDER_SITE_OTHER): Payer: Self-pay

## 2023-08-21 ENCOUNTER — Telehealth (INDEPENDENT_AMBULATORY_CARE_PROVIDER_SITE_OTHER): Payer: Self-pay

## 2023-08-21 DIAGNOSIS — K219 Gastro-esophageal reflux disease without esophagitis: Secondary | ICD-10-CM

## 2023-08-21 DIAGNOSIS — B1710 Acute hepatitis C without hepatic coma: Secondary | ICD-10-CM

## 2023-08-21 DIAGNOSIS — R197 Diarrhea, unspecified: Secondary | ICD-10-CM

## 2023-08-21 DIAGNOSIS — K921 Melena: Secondary | ICD-10-CM

## 2023-08-21 DIAGNOSIS — R634 Abnormal weight loss: Secondary | ICD-10-CM

## 2023-08-21 DIAGNOSIS — E739 Lactose intolerance, unspecified: Secondary | ICD-10-CM | POA: Diagnosis not present

## 2023-08-21 DIAGNOSIS — R1310 Dysphagia, unspecified: Secondary | ICD-10-CM

## 2023-08-21 NOTE — Telephone Encounter (Signed)
[name] with [employer] called to report she had to remove the patient's stools studies from the patients blood work yesterday. She needed [place] to re order the stool testing, which I did. I placed an order for Pancreatic elastase, fecal and fecal fat Qualitative.

## 2023-08-22 LAB — CELIAC DISEASE PANEL
Endomysial IgA: NEGATIVE
IgA/Immunoglobulin A, Serum: 224 mg/dL (ref 64–422)
Transglutaminase IgA: <2 U/mL (ref 0–3)

## 2023-08-26 DIAGNOSIS — R200 Anesthesia of skin: Secondary | ICD-10-CM | POA: Diagnosis not present

## 2023-08-26 DIAGNOSIS — R202 Paresthesia of skin: Secondary | ICD-10-CM | POA: Diagnosis not present

## 2023-08-27 ENCOUNTER — Other Ambulatory Visit (INDEPENDENT_AMBULATORY_CARE_PROVIDER_SITE_OTHER): Payer: Self-pay | Admitting: Gastroenterology

## 2023-08-27 LAB — FECAL FAT, QUALITATIVE

## 2023-08-27 LAB — PANCREATIC ELASTASE, FECAL: Pancreatic Elastase, Fecal: 188 ug Elast./g — ABNORMAL LOW (ref >200)

## 2023-08-27 MED ORDER — PANCRELIPASE (LIP-PROT-AMYL) 36000-114000 UNITS PO CPEP: ORAL_CAPSULE | ORAL | 8 refills | Status: DC

## 2023-08-28 ENCOUNTER — Other Ambulatory Visit: Payer: Self-pay

## 2023-08-28 ENCOUNTER — Encounter (INDEPENDENT_AMBULATORY_CARE_PROVIDER_SITE_OTHER): Payer: Self-pay

## 2023-08-28 DIAGNOSIS — J432 Centrilobular emphysema: Secondary | ICD-10-CM | POA: Diagnosis not present

## 2023-08-28 DIAGNOSIS — I70219 Atherosclerosis of native arteries of extremities with intermittent claudication, unspecified extremity: Secondary | ICD-10-CM

## 2023-08-28 DIAGNOSIS — I719 Aortic aneurysm of unspecified site, without rupture: Secondary | ICD-10-CM

## 2023-09-03 ENCOUNTER — Encounter: Payer: Self-pay | Admitting: Internal Medicine

## 2023-09-03 ENCOUNTER — Telehealth: Payer: Self-pay | Admitting: *Deleted

## 2023-09-03 ENCOUNTER — Encounter (INDEPENDENT_AMBULATORY_CARE_PROVIDER_SITE_OTHER): Payer: Self-pay

## 2023-09-03 ENCOUNTER — Encounter: Payer: Self-pay | Admitting: Cardiovascular Disease

## 2023-09-03 ENCOUNTER — Ambulatory Visit (INDEPENDENT_AMBULATORY_CARE_PROVIDER_SITE_OTHER): Payer: 59 | Admitting: Gastroenterology

## 2023-09-03 ENCOUNTER — Encounter (INDEPENDENT_AMBULATORY_CARE_PROVIDER_SITE_OTHER): Payer: Self-pay | Admitting: Gastroenterology

## 2023-09-03 ENCOUNTER — Telehealth (INDEPENDENT_AMBULATORY_CARE_PROVIDER_SITE_OTHER): Payer: Self-pay | Admitting: Gastroenterology

## 2023-09-03 VITALS — BP 118/65 | HR 102 | Temp 98.4°F | Ht 63.0 in | Wt 123.1 lb

## 2023-09-03 DIAGNOSIS — R634 Abnormal weight loss: Secondary | ICD-10-CM

## 2023-09-03 DIAGNOSIS — R197 Diarrhea, unspecified: Secondary | ICD-10-CM

## 2023-09-03 DIAGNOSIS — R109 Unspecified abdominal pain: Secondary | ICD-10-CM

## 2023-09-03 MED ORDER — PEG 3350-KCL-NA BICARB-NACL 420 G PO SOLR: 4000.0000 mL | Freq: Once | ORAL | 0 refills | Status: AC

## 2023-09-03 MED ORDER — DIPHENOXYLATE-ATROPINE 2.5-0.025 MG PO TABS: 1.0000 | ORAL_TABLET | Freq: Four times a day (QID) | ORAL | 1 refills | Status: DC | PRN

## 2023-09-03 NOTE — Telephone Encounter (Signed)
Pt has been scheduled for tele pre op appt 09/08/23 add on per Eligha Bridegroom, NP due to procedure date.   Med rec and consent are done.

## 2023-09-03 NOTE — Telephone Encounter (Signed)
09/03/23  [name] 1951-07-19  What type of surgery is being performed? COLONOSCOPY   When is surgery scheduled? 09/15/23  CLEARANCE TO HOLD PLAVIX FOR 5 DAYS  Name of physician performing surgery?  Dr. Katrinka Blazing [facility] Gastroenterology at [facility] Phone: [phone] Fax: [phone]  Anethesia type (none, local, MAC, general)? MAC
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
## 2023-09-03 NOTE — Telephone Encounter (Signed)
Pt has been scheduled for tele pre op appt 09/08/23 add on per Eligha Bridegroom, NP due to procedure date.   Med rec and consent are done.     Patient Consent for Virtual Visit        [name] has provided verbal consent on 09/03/2023 for a virtual visit (video or telephone).   CONSENT FOR VIRTUAL VISIT FOR:  [name]  By participating in this virtual visit I agree to the following:  I hereby voluntarily request, consent and authorize [facility] and its employed or contracted physicians, physician assistants, nurse practitioners or other licensed health care professionals (the Practitioner), to provide me with telemedicine health care services (the "Services") as deemed necessary by the treating Practitioner. I acknowledge and consent to receive the Services by the Practitioner via telemedicine. I understand that the telemedicine visit will involve communicating with the Practitioner through live audiovisual communication technology and the disclosure of certain medical information by electronic transmission. I acknowledge that I have been given the opportunity to request an in-person assessment or other available alternative prior to the telemedicine visit and am voluntarily participating in the telemedicine visit.  I understand that I have the right to withhold or withdraw my consent to the use of telemedicine in the course of my care at any time, without affecting my right to future care or treatment, and that the Practitioner or I may terminate the telemedicine visit at any time. I understand that I have the right to inspect all information obtained and/or recorded in the course of the telemedicine visit and may receive copies of available information for a reasonable fee.  I understand that some of the potential risks of receiving the Services via telemedicine include:  Delay or interruption in medical evaluation due to technological equipment failure or  disruption; Information transmitted may not be sufficient (e.g. poor resolution of images) to allow for appropriate medical decision making by the Practitioner; and/or  In rare instances, security protocols could fail, causing a breach of personal health information.  Furthermore, I acknowledge that it is my responsibility to provide information about my medical history, conditions and care that is complete and accurate to the best of my ability. I acknowledge that Practitioner's advice, recommendations, and/or decision may be based on factors not within their control, such as incomplete or inaccurate data provided by me or distortions of diagnostic images or specimens that may result from electronic transmissions. I understand that the practice of medicine is not an exact science and that Practitioner makes no warranties or guarantees regarding treatment outcomes. I acknowledge that a copy of this consent can be made available to me via my patient portal [facility] MyChart), or I can request a printed copy by calling the office of  [facility].    I understand that my insurance will be billed for this visit.   I have read or had this consent read to me. I understand the contents of this consent, which adequately explains the benefits and risks of the Services being provided via telemedicine.  I have been provided ample opportunity to ask questions regarding this consent and the Services and have had my questions answered to my satisfaction. I give my informed consent for the services to be provided through the use of telemedicine in my medical care

## 2023-09-03 NOTE — Patient Instructions (Signed)
Schedule colonoscopy Start Lomotil as needed for diarrhea Can stop Creon for now

## 2023-09-03 NOTE — Telephone Encounter (Signed)
Primary Cardiologist:Peter Eden Emms, MD   Preoperative team, please contact this patient and set up a phone call appointment for further preoperative risk assessment. Please obtain consent and complete medication review. Thank you for your help.   Plavix is not managed by cardiology.   I also confirmed the patient resides in the state of [place]. As per [facility] Medical Board telemedicine laws, the patient must reside in the state in which the provider is licensed.   Levi Aland, NP-C  09/03/2023, 3:58 PM [address], [address] Office [phone] Fax [phone]
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
## 2023-09-04 ENCOUNTER — Encounter (INDEPENDENT_AMBULATORY_CARE_PROVIDER_SITE_OTHER): Payer: Self-pay | Admitting: Gastroenterology

## 2023-09-07 ENCOUNTER — Encounter (INDEPENDENT_AMBULATORY_CARE_PROVIDER_SITE_OTHER): Payer: Self-pay

## 2023-09-08 ENCOUNTER — Ambulatory Visit: Payer: 59 | Attending: Nurse Practitioner

## 2023-09-08 DIAGNOSIS — Z01810 Encounter for preprocedural cardiovascular examination: Secondary | ICD-10-CM
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
## 2023-09-10 ENCOUNTER — Encounter (HOSPITAL_COMMUNITY)
Admission: RE | Admit: 2023-09-10 | Discharge: 2023-09-10 | Disposition: A | Discharge status: Home / Self Care | Payer: 59 | Source: Ambulatory Visit | Attending: Gastroenterology | Admitting: Gastroenterology

## 2023-09-15 ENCOUNTER — Ambulatory Visit (HOSPITAL_COMMUNITY)
Admission: RE | Admit: 2023-09-15 | Discharge: 2023-09-15 | Disposition: A | Discharge status: Home / Self Care | Payer: 59 | Attending: Gastroenterology | Admitting: Gastroenterology

## 2023-09-15 ENCOUNTER — Encounter (HOSPITAL_COMMUNITY)
Admission: RE | Disposition: A | Discharge status: Home / Self Care | Payer: Self-pay | Source: Home / Self Care | Attending: Gastroenterology

## 2023-09-15 ENCOUNTER — Ambulatory Visit (HOSPITAL_COMMUNITY): Payer: 59 | Admitting: Certified Registered"

## 2023-09-15 ENCOUNTER — Ambulatory Visit (HOSPITAL_BASED_OUTPATIENT_CLINIC_OR_DEPARTMENT_OTHER): Payer: 59 | Admitting: Certified Registered"

## 2023-09-15 DIAGNOSIS — Z860102 Personal history of hyperplastic colon polyps: Secondary | ICD-10-CM | POA: Diagnosis not present

## 2023-09-15 DIAGNOSIS — K635 Polyp of colon: Secondary | ICD-10-CM | POA: Diagnosis not present

## 2023-09-15 DIAGNOSIS — D126 Benign neoplasm of colon, unspecified: Secondary | ICD-10-CM

## 2023-09-15 DIAGNOSIS — G4730 Sleep apnea, unspecified: Secondary | ICD-10-CM | POA: Insufficient documentation

## 2023-09-15 DIAGNOSIS — I10 Essential (primary) hypertension: Secondary | ICD-10-CM | POA: Diagnosis not present

## 2023-09-15 DIAGNOSIS — K529 Noninfective gastroenteritis and colitis, unspecified: Secondary | ICD-10-CM | POA: Diagnosis present

## 2023-09-15 DIAGNOSIS — F17210 Nicotine dependence, cigarettes, uncomplicated: Secondary | ICD-10-CM | POA: Insufficient documentation

## 2023-09-15 DIAGNOSIS — Z09 Encounter for follow-up examination after completed treatment for conditions other than malignant neoplasm: Secondary | ICD-10-CM | POA: Insufficient documentation

## 2023-09-15 DIAGNOSIS — I2510 Atherosclerotic heart disease of native coronary artery without angina pectoris: Secondary | ICD-10-CM | POA: Insufficient documentation

## 2023-09-15 DIAGNOSIS — K52832 Lymphocytic colitis: Secondary | ICD-10-CM

## 2023-09-15 DIAGNOSIS — J449 Chronic obstructive pulmonary disease, unspecified: Secondary | ICD-10-CM | POA: Insufficient documentation

## 2023-09-15 DIAGNOSIS — E785 Hyperlipidemia, unspecified: Secondary | ICD-10-CM | POA: Insufficient documentation

## 2023-09-15 DIAGNOSIS — R197 Diarrhea, unspecified: Secondary | ICD-10-CM | POA: Diagnosis not present

## 2023-09-15 DIAGNOSIS — D125 Benign neoplasm of sigmoid colon: Secondary | ICD-10-CM | POA: Diagnosis not present

## 2023-09-15 DIAGNOSIS — K5730 Diverticulosis of large intestine without perforation or abscess without bleeding: Secondary | ICD-10-CM | POA: Insufficient documentation

## 2023-09-15 DIAGNOSIS — K5900 Constipation, unspecified: Secondary | ICD-10-CM

## 2023-09-15 DIAGNOSIS — D123 Benign neoplasm of transverse colon: Secondary | ICD-10-CM | POA: Insufficient documentation

## 2023-09-15 DIAGNOSIS — D124 Benign neoplasm of descending colon: Secondary | ICD-10-CM | POA: Diagnosis not present

## 2023-09-15 DIAGNOSIS — F17200 Nicotine dependence, unspecified, uncomplicated: Secondary | ICD-10-CM | POA: Diagnosis not present

## 2023-09-15 HISTORY — PX: COLONOSCOPY WITH PROPOFOL: SHX5780

## 2023-09-15 HISTORY — PX: BIOPSY: SHX5522

## 2023-09-15 HISTORY — PX: POLYPECTOMY: SHX5525

## 2023-09-15 LAB — HM COLONOSCOPY

## 2023-09-15 LAB — CBC
HCT: 37.2 % (ref 36.0–46.0)
Hemoglobin: 12 g/dL (ref 12.0–15.0)
MCH: 31 pg (ref 26.0–34.0)
MCHC: 32.3 g/dL (ref 30.0–36.0)
MCV: 96.1 fL (ref 80.0–100.0)
Platelets: 189 10*3/uL (ref 150–400)
RBC: 3.87 MIL/uL (ref 3.87–5.11)
RDW: 13.9 % (ref 11.5–15.5)
WBC: 6.2 10*3/uL (ref 4.0–10.5)
nRBC: 0 % (ref 0.0–0.2)

## 2023-09-15 LAB — IRON AND TIBC
Iron: 39 ug/dL (ref 28–170)
Saturation Ratios: 14 % (ref 10.4–31.8)
TIBC: 271 ug/dL (ref 250–450)
UIBC: 232 ug/dL

## 2023-09-15 LAB — FERRITIN: Ferritin: 112 ng/mL (ref 11–307)

## 2023-09-15 SURGERY — COLONOSCOPY WITH PROPOFOL
Anesthesia: General

## 2023-09-15 MED ORDER — PROPOFOL 10 MG/ML IV BOLUS
INTRAVENOUS | Status: DC | PRN
  Administered 2023-09-15: 100 mg via INTRAVENOUS

## 2023-09-15 MED ORDER — LACTATED RINGERS IV SOLN: INTRAVENOUS | Status: DC | PRN

## 2023-09-15 MED ORDER — EPHEDRINE 5 MG/ML INJ
INTRAVENOUS | Status: AC
  Filled 2023-09-15: qty 5

## 2023-09-15 MED ORDER — EPHEDRINE SULFATE-NACL 50-0.9 MG/10ML-% IV SOSY
PREFILLED_SYRINGE | INTRAVENOUS | Status: DC | PRN
  Administered 2023-09-15 (×3): 5 mg via INTRAVENOUS

## 2023-09-15 MED ORDER — LIDOCAINE HCL (PF) 2 % IJ SOLN
INTRAMUSCULAR | Status: DC | PRN
  Administered 2023-09-15: 50 mg via INTRADERMAL

## 2023-09-15 MED ORDER — SODIUM CHLORIDE 0.9% FLUSH: 10.0000 mL | Freq: Two times a day (BID) | INTRAVENOUS | Status: DC

## 2023-09-15 MED ORDER — PROPOFOL 500 MG/50ML IV EMUL
INTRAVENOUS | Status: DC | PRN
  Administered 2023-09-15: 150 ug/kg/min via INTRAVENOUS

## 2023-09-15 NOTE — Interval H&P Note (Signed)
History and Physical Interval Note:  09/15/2023 12:47 PM  [name]  has presented today for surgery, with the diagnosis of DIARRHEA.  The various methods of treatment have been discussed with the patient and family. After consideration of risks, benefits and other options for treatment, the patient has consented to  Procedure(s) with comments: COLONOSCOPY WITH PROPOFOL (N/A) - 2:00PM;ASA 1 as a surgical intervention.  The patient's history has been reviewed, patient examined, no change in status, stable for surgery.  I have reviewed the patient's chart and labs.  Questions were answered to the patient's satisfaction.     Katrinka Blazing Mayorga

## 2023-09-15 NOTE — Transfer of Care (Signed)
Immediate Anesthesia Transfer of Care Note  Patient: PENNIE VANBLARCOM  Procedure(s) Performed: COLONOSCOPY WITH PROPOFOL POLYPECTOMY BIOPSY  Patient Location: Short Stay  Anesthesia Type:General  Level of Consciousness: awake, alert , and oriented  Airway & Oxygen Therapy: Patient Spontanous Breathing  Post-op Assessment: Report given to RN and Post -op Vital signs reviewed and stable  Post vital signs: Reviewed and stable  Last Vitals:  Vitals Value Taken Time  BP 103/53 09/15/23 1356  Temp 36.6 C 09/15/23 1356  Pulse 89 09/15/23 1400  Resp 17 09/15/23 1400  SpO2 99 % 09/15/23 1400  Vitals shown include unfiled device data.  Last Pain:  Vitals:   09/15/23 1356  TempSrc: Axillary  PainSc: 0-No pain      Patients Stated Pain Goal: 6 (09/15/23 1257)  Complications: No notable events documented.

## 2023-09-15 NOTE — Anesthesia Procedure Notes (Signed)
Date/Time: 09/15/2023 1:27 PM  Performed by: Julian Reil, CRNAPre-anesthesia Checklist: Patient identified, Emergency Drugs available, Suction available and Patient being monitored Patient Re-evaluated:Patient Re-evaluated prior to induction Oxygen Delivery Method: Nasal cannula Induction Type: IV induction Placement Confirmation: positive ETCO2

## 2023-09-15 NOTE — Op Note (Addendum)
[facility] Patient Name: [name] Procedure Date: 09/15/2023 12:50 PM MRN: [id number] Date of Birth: 1950-12-10 Attending MD: Katrinka Blazing , , [id number] CSN: [id number] Age: 72 Admit Type: Outpatient Procedure:                Colonoscopy Indications:              Clinically significant diarrhea of unexplained                            origin Providers:                Katrinka Blazing, Sheran Fava, Kristine L.                            Jessee Avers, Technician Referring MD:              Medicines:                Monitored Anesthesia Care Complications:            No immediate complications. Estimated Blood Loss:     Estimated blood loss: none. Procedure:                Pre-Anesthesia Assessment:                           - Prior to the procedure, a History and Physical                            was performed, and patient medications, allergies                            and sensitivities were reviewed. The patient's                            tolerance of previous anesthesia was reviewed.                           - The risks and benefits of the procedure and the                            sedation options and risks were discussed with the                            patient. All questions were answered and informed                            consent was obtained.                           - ASA Grade Assessment: III - A patient with severe                            systemic disease.                           After obtaining informed consent, the colonoscope  was passed under direct vision. Throughout the                            procedure, the patient's blood pressure, pulse, and                            oxygen saturations were monitored continuously. The                            [id number]-[id number] ([id number]) scope was introduced through                            the anus and advanced to the the terminal ileum.                             The colonoscopy was performed without difficulty.                            The patient tolerated the procedure well. The                            quality of the bowel preparation was excellent. Scope In: 1:31:50 PM Scope Out: 1:51:18 PM Scope Withdrawal Time: 0 hours 15 minutes 9 seconds  Total Procedure Duration: 0 hours 19 minutes 28 seconds  Findings:      The perianal and digital rectal examinations were normal.      The terminal ileum appeared normal.      Two sessile polyps were found in the sigmoid colon and descending colon.       The polyps were 2 to 3 mm in size. These polyps were removed with a cold       snare. Resection and retrieval were complete.      Multiple large-mouthed and small-mouthed diverticula were found in the       sigmoid colon and descending colon. Biopsies for histology were taken       from normal colon with a cold forceps from the right colon and left       colon for evaluation of microscopic colitis.      The retroflexed view of the distal rectum and anal verge was normal and       showed no anal or rectal abnormalities. Impression:               - The examined portion of the ileum was normal.                           - Two 2 to 3 mm polyps in the sigmoid colon and in                            the descending colon, removed with a cold snare.                            Resected and retrieved.                           - Diverticulosis in the sigmoid colon and  in the                            descending colon. Normal colon biopsied.                           - The distal rectum and anal verge are normal on                            retroflexion view. Moderate Sedation:      Per Anesthesia Care Recommendation:           - Discharge patient to home (ambulatory).                           - Resume previous diet.                           - Await pathology results.                           - Repeat colonoscopy date to be determined after                             pending pathology results are reviewed for                            screening purposes.                           - Continue Lomotil as needed for diarrhea                           - Restart oral iron.                           - Take Creon pancreatic enzymes 2 pills with each                            meal.                           - Consider repeat abdominal imaging if negative                            biopsies                           - May consider trial of mesalamine for possible                            SCAD if biopsies are negative. Procedure Code(s):        --- Professional ---                           [id number], Colonoscopy, flexible; with removal of  tumor(s), polyp(s), or other lesion(s) by snare                            technique                           45380, 59, Colonoscopy, flexible; with biopsy,                            single or multiple Diagnosis Code(s):        --- Professional ---                           D12.5, Benign neoplasm of sigmoid colon                           D12.4, Benign neoplasm of descending colon                           R19.7, Diarrhea, unspecified                           K57.30, Diverticulosis of large intestine without                            perforation or abscess without bleeding CPT copyright 2022 American Medical Association. All rights reserved. The codes documented in this report are preliminary and upon coder review may  be revised to meet current compliance requirements. Katrinka Blazing, MD Katrinka Blazing,  09/15/2023 1:59:41 PM This report has been signed electronically. Number of Addenda: 0

## 2023-09-15 NOTE — Discharge Instructions (Signed)
You are being discharged to home.  Resume your previous diet.  We are waiting for your pathology results.  Your physician has recommended a repeat colonoscopy (date to be determined after pending pathology results are reviewed) for screening purposes.  Continue Lomotil as needed for diarrhea Restart oral iron. Take Creon pancreatic enzymes 2 pills with each meal.

## 2023-09-15 NOTE — Anesthesia Preprocedure Evaluation (Signed)
Anesthesia Evaluation  Patient identified by MRN, date of birth, ID band Patient awake    Reviewed: Allergy & Precautions, H&P , NPO status , Patient's Chart, lab work & pertinent test results, reviewed documented beta blocker date and time   History of Anesthesia Complications (+) history of anesthetic complications  Airway Mallampati: II  TM Distance: >3 FB Neck ROM: full    Dental no notable dental hx. (+) Upper Dentures, Lower Dentures   Pulmonary sleep apnea and Oxygen sleep apnea , COPD, Current Smoker   Pulmonary exam normal breath sounds clear to auscultation       Cardiovascular Exercise Tolerance: Good hypertension, + CAD, + Peripheral Vascular Disease and + DOE  negative cardio ROS + dysrhythmias  Rhythm:regular Rate:Normal     Neuro/Psych  Headaches PSYCHIATRIC DISORDERS Anxiety Depression     Neuromuscular disease negative neurological ROS  negative psych ROS   GI/Hepatic negative GI ROS, Neg liver ROS, hiatal hernia, PUD,GERD  ,,(+) Hepatitis -  Endo/Other  negative endocrine ROS    Renal/GU negative Renal ROS  negative genitourinary   Musculoskeletal   Abdominal   Peds  Hematology negative hematology ROS (+) Blood dyscrasia, anemia   Anesthesia Other Findings   Reproductive/Obstetrics negative OB ROS                             Anesthesia Physical Anesthesia Plan  ASA: 3  Anesthesia Plan: General   Post-op Pain Management:    Induction:   PONV Risk Score and Plan: Propofol infusion  Airway Management Planned:   Additional Equipment:   Intra-op Plan:   Post-operative Plan:   Informed Consent: I have reviewed the patients History and Physical, chart, labs and discussed the procedure including the risks, benefits and alternatives for the proposed anesthesia with the patient or authorized representative who has indicated his/her understanding and acceptance.      Dental Advisory Given  Plan Discussed with: CRNA  Anesthesia Plan Comments:        Anesthesia Quick Evaluation

## 2023-09-16 LAB — SURGICAL PATHOLOGY

## 2023-09-18 ENCOUNTER — Encounter (HOSPITAL_COMMUNITY): Payer: Self-pay | Admitting: Gastroenterology

## 2023-09-18 ENCOUNTER — Encounter (INDEPENDENT_AMBULATORY_CARE_PROVIDER_SITE_OTHER): Payer: Self-pay | Admitting: Gastroenterology

## 2023-09-18 ENCOUNTER — Encounter (INDEPENDENT_AMBULATORY_CARE_PROVIDER_SITE_OTHER): Payer: Self-pay | Admitting: *Deleted

## 2023-09-18 DIAGNOSIS — M19071 Primary osteoarthritis, right ankle and foot: Secondary | ICD-10-CM | POA: Diagnosis not present

## 2023-09-18 DIAGNOSIS — J438 Other emphysema: Secondary | ICD-10-CM | POA: Diagnosis not present

## 2023-09-18 DIAGNOSIS — R030 Elevated blood-pressure reading, without diagnosis of hypertension: Secondary | ICD-10-CM | POA: Diagnosis not present

## 2023-09-19 NOTE — Anesthesia Postprocedure Evaluation (Signed)
Anesthesia Post Note  Patient: [name]  Procedure(s) Performed: COLONOSCOPY WITH PROPOFOL POLYPECTOMY BIOPSY  Patient location during evaluation: Phase II Anesthesia Type: General Level of consciousness: awake Pain management: pain level controlled Vital Signs Assessment: post-procedure vital signs reviewed and stable Respiratory status: spontaneous breathing and respiratory function stable Cardiovascular status: blood pressure returned to baseline and stable Postop Assessment: no headache and no apparent nausea or vomiting Anesthetic complications: no Comments: Late entry   No notable events documented.   Last Vitals:  Vitals:   09/15/23 1257 09/15/23 1356  BP: (!) 115/101 (!) 103/53  Pulse: 79 86  Resp: 18 18  Temp: 36.7 C 36.6 C  SpO2: 96% 99%    Last Pain:  Vitals:   09/16/23 1555  TempSrc:   PainSc: 0-No pain                 Windell Norfolk

## 2023-09-24 ENCOUNTER — Ambulatory Visit (INDEPENDENT_AMBULATORY_CARE_PROVIDER_SITE_OTHER): Payer: 59 | Admitting: Gastroenterology

## 2023-09-24 ENCOUNTER — Telehealth (INDEPENDENT_AMBULATORY_CARE_PROVIDER_SITE_OTHER): Payer: Self-pay | Admitting: Gastroenterology

## 2023-09-24 ENCOUNTER — Encounter (INDEPENDENT_AMBULATORY_CARE_PROVIDER_SITE_OTHER): Payer: Self-pay | Admitting: Gastroenterology

## 2023-09-24 VITALS — BP 170/81 | HR 80 | Temp 98.7°F | Wt 129.0 lb

## 2023-09-24 DIAGNOSIS — M545 Low back pain, unspecified: Secondary | ICD-10-CM | POA: Diagnosis not present

## 2023-09-24 DIAGNOSIS — K52832 Lymphocytic colitis: Secondary | ICD-10-CM | POA: Diagnosis not present

## 2023-09-24 DIAGNOSIS — F17210 Nicotine dependence, cigarettes, uncomplicated: Secondary | ICD-10-CM

## 2023-09-24 DIAGNOSIS — R1032 Left lower quadrant pain: Secondary | ICD-10-CM | POA: Diagnosis not present

## 2023-09-24 MED ORDER — COLESTIPOL HCL 1 G PO TABS: 2.0000 g | ORAL_TABLET | Freq: Every day | ORAL | 3 refills | Status: DC

## 2023-09-24 NOTE — Telephone Encounter (Signed)
This member's benefit plan did not require a prior authorization for this request.   Please print and retain a copy of this confirmation in the patient's medical record.   Physician Name: Dr. [name] Contact: Marlowe Shores Physician Address: [address] [place], [place] [id number] Phone Number: [phone]  Fax Number: [phone] Patient Name: [name] Patient Id: [id number] Insurance Carrier: [employer] Primary Diagnosis Code: R10.32 Description: Left lower quadrant pain Secondary Diagnosis Code:  Description:  CPT Code 18841 Description: CT ABDOMEN & PELVIS W/ Case Number: [id number] Review Date: 09/24/2023 4:47:36 PM Expiration Date: N/A Status: This member's benefit plan did not require a prior authorization for this request.
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
## 2023-09-24 NOTE — Patient Instructions (Addendum)
Start colestipol 2 g every day.  If not presenting adequate control of diarrhea, can increase to 4 g/day (4 pills) Stop diphenoxylate Schedule CT abdomen/pelvis with IV contrast Smoking cessation

## 2023-09-25 ENCOUNTER — Telehealth (INDEPENDENT_AMBULATORY_CARE_PROVIDER_SITE_OTHER): Payer: Self-pay | Admitting: Gastroenterology

## 2023-09-25 NOTE — Telephone Encounter (Signed)
No PA needed for CT per insurance. Pt scheduled for 10/07/23 at 1:30pm, pt to arrive at 11:15am to begin drinking contrast. Pt contacted and made aware of appt.

## 2023-09-28 ENCOUNTER — Ambulatory Visit (HOSPITAL_COMMUNITY)
Admission: RE | Admit: 2023-09-28 | Discharge: 2023-09-28 | Disposition: A | Discharge status: Home / Self Care | Payer: 59 | Source: Ambulatory Visit | Attending: Family Medicine | Admitting: Family Medicine

## 2023-09-28 ENCOUNTER — Encounter (INDEPENDENT_AMBULATORY_CARE_PROVIDER_SITE_OTHER): Payer: Self-pay

## 2023-09-28 DIAGNOSIS — Z1231 Encounter for screening mammogram for malignant neoplasm of breast: Secondary | ICD-10-CM | POA: Insufficient documentation

## 2023-09-28 DIAGNOSIS — J432 Centrilobular emphysema: Secondary | ICD-10-CM | POA: Diagnosis not present

## 2023-09-30 ENCOUNTER — Encounter (INDEPENDENT_AMBULATORY_CARE_PROVIDER_SITE_OTHER): Payer: Self-pay | Admitting: Gastroenterology

## 2023-10-02 ENCOUNTER — Encounter (INDEPENDENT_AMBULATORY_CARE_PROVIDER_SITE_OTHER): Payer: Self-pay | Admitting: Gastroenterology

## 2023-10-02 ENCOUNTER — Other Ambulatory Visit (INDEPENDENT_AMBULATORY_CARE_PROVIDER_SITE_OTHER): Payer: Self-pay | Admitting: Gastroenterology

## 2023-10-02 DIAGNOSIS — K52832 Lymphocytic colitis: Secondary | ICD-10-CM

## 2023-10-02 MED ORDER — BUDESONIDE 3 MG PO CPEP: ORAL_CAPSULE | ORAL | 0 refills | Status: DC

## 2023-10-07 ENCOUNTER — Ambulatory Visit (HOSPITAL_COMMUNITY): Admission: RE | Admit: 2023-10-07 | Payer: 59 | Source: Ambulatory Visit

## 2023-10-15 DIAGNOSIS — G4700 Insomnia, unspecified: Secondary | ICD-10-CM | POA: Diagnosis not present

## 2023-10-15 DIAGNOSIS — R030 Elevated blood-pressure reading, without diagnosis of hypertension: Secondary | ICD-10-CM | POA: Diagnosis not present

## 2023-10-15 DIAGNOSIS — M19071 Primary osteoarthritis, right ankle and foot: Secondary | ICD-10-CM | POA: Diagnosis not present

## 2023-10-15 DIAGNOSIS — G4730 Sleep apnea, unspecified: Secondary | ICD-10-CM | POA: Diagnosis not present

## 2023-10-20 ENCOUNTER — Ambulatory Visit (HOSPITAL_COMMUNITY): Payer: 59

## 2023-10-28 DIAGNOSIS — J432 Centrilobular emphysema: Secondary | ICD-10-CM | POA: Diagnosis not present

## 2023-10-30 ENCOUNTER — Ambulatory Visit (INDEPENDENT_AMBULATORY_CARE_PROVIDER_SITE_OTHER): Payer: 59 | Admitting: Family Medicine

## 2023-10-30 ENCOUNTER — Encounter: Payer: Self-pay | Admitting: Family Medicine

## 2023-10-30 VITALS — BP 111/69 | HR 98 | Ht 63.0 in | Wt 125.1 lb

## 2023-10-30 DIAGNOSIS — F17200 Nicotine dependence, unspecified, uncomplicated: Secondary | ICD-10-CM | POA: Diagnosis not present

## 2023-10-30 DIAGNOSIS — Z0001 Encounter for general adult medical examination with abnormal findings: Secondary | ICD-10-CM | POA: Diagnosis not present

## 2023-10-30 DIAGNOSIS — Z9189 Other specified personal risk factors, not elsewhere classified: Secondary | ICD-10-CM | POA: Diagnosis not present

## 2023-10-30 NOTE — Patient Instructions (Addendum)
F/u in 5 months, call if you need me sooner  Please schedule bone density at checkout  Please cut back SLOWLY on smoking, as abnle  Thankful that you do feel MUCH better  Best for 2025!  Thanks for choosing [facility], we consider it a privelige to serve you.

## 2023-10-30 NOTE — Progress Notes (Unsigned)
    [name]     MRN: [id number]      DOB: Oct 20, 1951  Chief Complaint  Patient presents with   Annual Exam    CPE requesting referral to new GI not happy with upstairs     HPI: Patient is in for annual physical exam. Requests establishment with new GI Doc  Has paper documenting cancer in family members on her Mother's side  and has some concern as to whether any tests are indicated for her  as a result of this. Does accurately point to the fact that she is 51, and also since  she has no children there would  be benefit there Immunization is reviewed , and  is up to date Has felt 300% better since her Right lower ext vascular surgery, more mobile, less pain, able to work on projects that she has wanted to Home repairs as in ramps are almost completed , will get walk in shower PE: BP 111/69 (BP Location: Right Arm, Patient Position: Sitting, Cuff Size: Normal)   Pulse 98   Ht 5\' 3"  (1.6 m)   Wt 125 lb 1.3 oz (56.7 kg)   SpO2 92%   BMI 22.16 kg/m   Pleasant  female, alert and oriented x 3, in no cardio-pulmonary distress. Afebrile. HEENT No facial trauma or asymetry. Sinuses non tender.  Extra occullar muscles intact.. External ears normal, . Neck: supple, carotid bruits Chest: Clear to ascultation bilaterally.decreased air entrty. Non tender to palpation  Breast: Not examined, mammogram UTD and normal Cardiovascular system; Heart sounds normal,  S1 and  S2 ,no S3.  No murmur, or thrill. Abdomen: Soft, non tender   Musculoskeletal exam: Full ROM of spine, hips , shoulders and knees. Neurologic: Cranial nerves 2 to 12 intact. Power, tone ,sensation and reflexes normal throughout. No disturbance in gait. No tremor.  Skin: Intact, no ulceration, erythema , scaling or rash noted. Pigmentation normal throughout  Psych; Normal mood and affect. Judgement and concentration normal   Assessment & Plan:  Annual visit for general adult medical examination with  abnormal findings Annual exam as documented.  Immunization and cancer screening needs are specifically addressed at this visit.   NICOTINE ADDICTION Asked:confirms currently smokes cigarettes Assess: Unwilling to set a quit date, but is cutting back Advise: needs to QUIT to reduce risk of cancer, cardio and cerebrovascular disease Assist: counseled for 5 minutes and literature provided Arrange: follow up in 2 to 4 months   At risk for cancer Personal h/o lung cancer, ongoing nicotine use, family h/o  cancer in several family members on Mother's side, Smoking cessation encouraged D/w Oncologist at upcoming appt Will not refer for genetic testing but defer to Oncology to determine if indicated and carry out plan if one is suggested

## 2023-10-31 ENCOUNTER — Encounter: Payer: Self-pay | Admitting: Family Medicine

## 2023-11-01 ENCOUNTER — Encounter: Payer: Self-pay | Admitting: Family Medicine

## 2023-11-01 DIAGNOSIS — Z9189 Other specified personal risk factors, not elsewhere classified: Secondary | ICD-10-CM | POA: Insufficient documentation

## 2023-11-01 NOTE — Assessment & Plan Note (Signed)
Personal h/o lung cancer, ongoing nicotine use, family h/o  cancer in several family members on Mother's side, Smoking cessation encouraged D/w Oncologist at upcoming appt Will not refer for genetic testing but defer to Oncology to determine if indicated and carry out plan if one is suggested

## 2023-11-01 NOTE — Assessment & Plan Note (Signed)
Annual exam as documented. . Immunization and cancer screening needs are specifically addressed at this visit.  

## 2023-11-01 NOTE — Assessment & Plan Note (Signed)
Asked:confirms currently smokes cigarettes °Assess: Unwilling to set a quit date, but is cutting back °Advise: needs to QUIT to reduce risk of cancer, cardio and cerebrovascular disease °Assist: counseled for 5 minutes and literature provided °Arrange: follow up in 2 to 4 months ° °

## 2023-11-02 ENCOUNTER — Other Ambulatory Visit: Payer: Self-pay

## 2023-11-02 MED ORDER — EPINEPHRINE 0.3 MG/0.3ML IJ SOAJ: 0.3000 mg | INTRAMUSCULAR | 0 refills | Status: DC | PRN

## 2023-11-12 DIAGNOSIS — G4730 Sleep apnea, unspecified: Secondary | ICD-10-CM | POA: Diagnosis not present

## 2023-11-12 DIAGNOSIS — G4700 Insomnia, unspecified: Secondary | ICD-10-CM | POA: Diagnosis not present

## 2023-11-12 DIAGNOSIS — R030 Elevated blood-pressure reading, without diagnosis of hypertension: Secondary | ICD-10-CM | POA: Diagnosis not present

## 2023-11-12 DIAGNOSIS — I471 Supraventricular tachycardia, unspecified: Secondary | ICD-10-CM | POA: Diagnosis not present

## 2023-11-12 DIAGNOSIS — J438 Other emphysema: Secondary | ICD-10-CM | POA: Diagnosis not present

## 2023-11-12 DIAGNOSIS — I482 Chronic atrial fibrillation, unspecified: Secondary | ICD-10-CM | POA: Diagnosis not present

## 2023-11-12 DIAGNOSIS — M19071 Primary osteoarthritis, right ankle and foot: Secondary | ICD-10-CM | POA: Diagnosis not present
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
## 2023-11-19 ENCOUNTER — Ambulatory Visit (HOSPITAL_COMMUNITY)
Admission: RE | Admit: 2023-11-19 | Discharge: 2023-11-19 | Disposition: A | Discharge status: Home / Self Care | Payer: Medicare HMO | Source: Ambulatory Visit | Attending: Family Medicine | Admitting: Family Medicine

## 2023-11-19 DIAGNOSIS — Z780 Asymptomatic menopausal state: Secondary | ICD-10-CM | POA: Insufficient documentation

## 2023-11-19 DIAGNOSIS — M85851 Other specified disorders of bone density and structure, right thigh: Secondary | ICD-10-CM | POA: Diagnosis not present

## 2023-11-19 DIAGNOSIS — M85831 Other specified disorders of bone density and structure, right forearm: Secondary | ICD-10-CM | POA: Diagnosis not present

## 2023-11-24 ENCOUNTER — Ambulatory Visit: Payer: Medicare HMO | Admitting: Cardiovascular Disease

## 2023-11-24 ENCOUNTER — Encounter: Payer: Self-pay | Admitting: Family Medicine

## 2023-12-01 NOTE — Progress Notes (Unsigned)
Cardiology Office Note    Date:  12/02/2023  ID:  [name], DOB Apr 02, 1951, MRN [id number] Cardiologist: Charlton Haws, MD   EP: Dr. Ladona Ridgel  History of Present Illness:    [name] is a 73 y.o. female with past medical history of CAD (s/p Coronary CTA in 12/2022 showing mild to moderate nonobstructive disease and not significant by FFR), paroxysmal atrial fibrillation/AVNRT (s/p ablation in 2014, repeat SVT ablation in 03/2023), carotid artery stenosis, PAD (s/p stenting of bilateral common iliac arteries in 06/2023), history of lung cancer (s/p RLL lobectomy), HTN, HLD, COPD and history of GI bleed who presents to the office today for 69-month follow-up.  She was examined by Dr. Eden Jones in 03/2023 and Flecainide had recently been discontinued given the diagnosis of CAD. BP was above goal and Hydralazine was titrated to 25 mg 3 times daily. She had been evaluated by Dr. Ladona Ridgel and did undergo SVT ablation in 03/2023. At the time of her visit with Dr. Ladona Ridgel in 04/2023, she was overall doing well since her ablation and denied any recurrent palpitations.  In talking with the patient today, she reports having more frequent palpitations and episodes of elevated blood pressure over the past few months. She has been very anxious about this but reports having a history of anxiety and panic disorder in the past and is unsure if this is contributing to episodes as well. Describes her extra beats as feeling like a strong thump in her chest. Reports symptoms sometimes improve with vagal maneuvers. She does consume 1-2 caffeinated beverages a day and 1 glass of wine but no acute changes in this. Reports her leg pain has significantly improved since undergoing prior iliac stenting. Denies any specific progressive dyspnea on exertion or associated chest pain. No recent orthopnea, PND or pitting edema.  Studies Reviewed:   EKG: EKG is not ordered today.  Coronary CTA: 12/2022 FINDINGS: Quality:  Fair, HR 68, attenuation artifact   Coronary calcium score: The patient's coronary artery calcium score is 2260, which places the patient in the 99th percentile.   Coronary arteries: Normal coronary origins.  Right dominance.   Right Coronary Artery: Dominant. Diffusely calcified with mild to moderate stenosis.   Left Main Coronary Artery: Normal. Bifurcates into the LAD and LCx arteries.   Left Anterior Descending Coronary Artery: Large anterior artery that wraps around the apex. There is mild to moderate proximal to mid-vessel diffuse stenosis. Smaller D1 and larger D2 branches are noted.   Left Circumflex Artery: Lateral branch with smaller distal OM vessel (that has no disease). There are minimal to mild mixed stenoses of the proximal vessel.   Aorta: Normal size, 28 mm at the mid ascending aorta (level of the PA bifurcation) measured double oblique. Heavy atherosclerosis - particularly of the descending aorta. No dissection.   Aortic Valve: Trileaflet.  Mild leaflet and annular calcifications.   Other findings:   Normal pulmonary vein drainage into the left atrium.   Normal left atrial appendage without a thrombus.   Normal size of the pulmonary artery.   Mild posterior mitral annular calcification.   IMPRESSION: 1. Heavy coronary calcification with probably mild to moderate mixed non-obstructive CAD, CADRADS = 3. CT FFR will be performed and reported separately.   2. Coronary calcium score of 2260. This was 99th percentile for age and sex matched control.   3. Normal coronary origin with right dominance.   4. Heavy aortic atherosclerosis - particularly the descending aorta   5. Mild  aortic valve leaflet and annular calcification mild posterior mitral annular calcification.   1. Left Main:  No significant stenosis. FFR = 1.00   2. LAD: No significant stenosis. Proximal FFR = 0.98, Mid FFR = 0.96, Distal FFR = not reported 3. LCX: No significant stenosis.  Proximal FFR = 0.98, Distal FFR = 0.93 4. RCA: No significant stenosis. Proximal FFR = 1.00, Mid FFR = 0.92, Distal FFR = 0.91   IMPRESSION: 1.  CT FFR analysis did not show any significant stenosis.    Physical Exam:   VS:  BP 132/78   Pulse 90   Ht 5\' 3"  (1.6 m)   Wt 128 lb (58.1 kg)   SpO2 93%   BMI 22.67 kg/m    Wt Readings from Last 3 Encounters:  12/02/23 128 lb (58.1 kg)  10/30/23 125 lb 1.3 oz (56.7 kg)  09/24/23 129 lb (58.5 kg)     GEN: Well nourished, well developed female appearing in no acute distress NECK: No JVD; No carotid bruits CARDIAC: RRR, no murmurs, rubs, gallops RESPIRATORY:  Clear to auscultation without rales, wheezing or rhonchi  ABDOMEN: Appears non-distended. No obvious abdominal masses. EXTREMITIES: No clubbing or cyanosis. No pitting edema.  Distal pedal pulses are 2+ bilaterally.   Assessment and Plan:   1. CAD - Prior Coronary CTA in 12/2022 showed mild to moderate nonobstructive disease as outlined above but this was not significant by FFR and medical therapy was recommended. No recent anginal symptoms. Continue with risk factor modification. She remains on Zetia 10 mg daily and Crestor 20 mg daily.  She is not on ASA given the use of Plavix (on this per Vascular Surgery).  2. AVNRT/Palpitations - She underwent prior ablation in 2014 and repeat SVT ablation in 03/2023. She does report more frequent palpitations as discussed above and reviewed options in regards to a repeat cardiac monitor vs. adding back beta-blocker therapy. She was previously on Flecainide but this was discontinued given CAD by Coronary CTA. At this time, she prefers medical therapy and we will try adding back Toprol-XL at a lower dose of 12.5 mg daily. We reviewed she could titrate this to 25 mg daily within the next few weeks pending symptoms.  3. PAD - She has a history of carotid artery stenosis (dopplers in 11/2022 showed 50-69% stenosis along LICA and no significant  stenosis along RICA) and also previously underwent stenting of her bilateral common iliac arteries in 06/2023 and is followed closely by Vascular Surgery. Denies any recent claudication. Remains on Plavix 37.5 mg daily and is also on Crestor 20 mg daily and Zetia 10 mg daily.  4. HTN - Blood pressure was initially recorded at 142/72, rechecked and improved to 132/78. Her blood pressure has been variable when checked at home and suspect anxiety is contributing to this as well. We reviewed that she should review this with her PCP if anxiety continues to be an issue as she has a history of panic disorder. Will restart Toprol-XL as discussed above. Continue Hydralazine 25 mg 3 times daily and Valsartan 320 mg daily. Hydralazine can be further titrated in the future if needed.  5. HLD - LDL was at 45 in 06/2023. Continue current medical therapy with Crestor 20 mg daily and Zetia 10 mg daily.   Signed, Ellsworth Lennox, PA-C

## 2023-12-02 ENCOUNTER — Ambulatory Visit: Payer: Medicare HMO | Attending: Student | Admitting: Student

## 2023-12-02 ENCOUNTER — Encounter: Payer: Self-pay | Admitting: Student

## 2023-12-02 VITALS — BP 132/78 | HR 90 | Ht 63.0 in | Wt 128.0 lb

## 2023-12-02 DIAGNOSIS — I10 Essential (primary) hypertension: Secondary | ICD-10-CM | POA: Diagnosis not present

## 2023-12-02 DIAGNOSIS — I471 Supraventricular tachycardia, unspecified: Secondary | ICD-10-CM | POA: Diagnosis not present

## 2023-12-02 DIAGNOSIS — E785 Hyperlipidemia, unspecified: Secondary | ICD-10-CM

## 2023-12-02 DIAGNOSIS — I2510 Atherosclerotic heart disease of native coronary artery without angina pectoris: Secondary | ICD-10-CM

## 2023-12-02 DIAGNOSIS — I739 Peripheral vascular disease, unspecified: Secondary | ICD-10-CM | POA: Diagnosis not present

## 2023-12-02 MED ORDER — METOPROLOL SUCCINATE ER 25 MG PO TB24: 25.0000 mg | ORAL_TABLET | Freq: Every day | ORAL | 3 refills | Status: DC

## 2023-12-02 NOTE — Patient Instructions (Addendum)
Medication Instructions:  Start Toprol-XL 25mg  daily. Can start as a half-tablet for 1-2 weeks then increase to an entire tablet.   Follow-Up: Your physician recommends that you schedule a follow-up appointment in: 2-3 months with Randall An, PA or [name]  Any Other Special Instructions Will Be Listed Below (If Applicable).  If you need a refill on your cardiac medications before your next appointment, please call your pharmacy.

## 2023-12-09 ENCOUNTER — Encounter: Payer: Self-pay | Admitting: Family Medicine

## 2023-12-09 DIAGNOSIS — R490 Dysphonia: Secondary | ICD-10-CM

## 2023-12-09 DIAGNOSIS — F17218 Nicotine dependence, cigarettes, with other nicotine-induced disorders: Secondary | ICD-10-CM

## 2023-12-09 NOTE — Telephone Encounter (Unsigned)
Copied from CRM [id number]. Topic: Referral - Request for Referral >> Dec 09, 2023 10:17 AM Gildardo Pounds wrote: Did the patient discuss referral with their provider in the last year? Yes (If No - schedule appointment) (If Yes - send message)  Appointment offered? 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 Patient does not want Dr Marlise Eves  Preference of office, provider, location: [place], [place]  If referral order, have you been seen by this specialty before? Yes (If Yes, this issue or another issue? When? Where?  Can we respond through MyChart? No

## 2023-12-14 DIAGNOSIS — G4700 Insomnia, unspecified: Secondary | ICD-10-CM | POA: Diagnosis not present

## 2023-12-14 DIAGNOSIS — G4730 Sleep apnea, unspecified: Secondary | ICD-10-CM | POA: Diagnosis not present

## 2023-12-14 DIAGNOSIS — M19071 Primary osteoarthritis, right ankle and foot: Secondary | ICD-10-CM | POA: Diagnosis not present

## 2023-12-14 DIAGNOSIS — R030 Elevated blood-pressure reading, without diagnosis of hypertension: Secondary | ICD-10-CM | POA: Diagnosis not present

## 2023-12-21 ENCOUNTER — Encounter: Payer: Self-pay | Admitting: Family Medicine

## 2023-12-22 ENCOUNTER — Other Ambulatory Visit: Payer: Self-pay | Admitting: Family Medicine

## 2023-12-22 MED ORDER — AMOXICILLIN-POT CLAVULANATE 875-125 MG PO TABS: 1.0000 | ORAL_TABLET | Freq: Two times a day (BID) | ORAL | 0 refills | Status: DC

## 2023-12-22 NOTE — Progress Notes (Signed)
Bitten by stray cat 3 to 4 days ago

## 2023-12-24 ENCOUNTER — Ambulatory Visit (INDEPENDENT_AMBULATORY_CARE_PROVIDER_SITE_OTHER): Payer: Medicare HMO | Admitting: Otolaryngology

## 2023-12-24 ENCOUNTER — Encounter (INDEPENDENT_AMBULATORY_CARE_PROVIDER_SITE_OTHER): Payer: Self-pay | Admitting: Otolaryngology

## 2023-12-24 VITALS — BP 149/79 | HR 80 | Ht 63.0 in | Wt 130.0 lb

## 2023-12-24 DIAGNOSIS — F17210 Nicotine dependence, cigarettes, uncomplicated: Secondary | ICD-10-CM | POA: Diagnosis not present

## 2023-12-24 DIAGNOSIS — R490 Dysphonia: Secondary | ICD-10-CM | POA: Diagnosis not present

## 2023-12-24 DIAGNOSIS — J387 Other diseases of larynx: Secondary | ICD-10-CM | POA: Diagnosis not present

## 2023-12-24 DIAGNOSIS — F17200 Nicotine dependence, unspecified, uncomplicated: Secondary | ICD-10-CM

## 2023-12-24 DIAGNOSIS — J383 Other diseases of vocal cords: Secondary | ICD-10-CM
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
## 2023-12-24 NOTE — Patient Instructions (Signed)
Dear [name],   Congratulations for your interest in quitting smoking!  Find a program that suits you best: when you want to quit, how you need support, where you live, and how you like to learn.    If you're ready to get started TODAY, consider scheduling a visit through [email or web address].  Appointments are available from 8am to 8pm, Monday to Friday.   Most health insurance plans will cover some level of tobacco cessation visits and medications.    Additional Resources: [employer] are also available to help you quit & provide the support you'll need. Many programs are available in both [place] and Spanish and have a long history of successfully helping people get off and stay off tobacco.    Quit Smoking Apps:  quitSTART at [employer].de QuitGuide?at ForgetParking.dk Online education and resources: Smokefree  at [employer].gov Free Telephone Coaching: QuitNow,  Call 1-800-QUIT-NOW (610-589-4864) or Text- Ready to (878) 314-4061 *Quitline [place] has teamed up with Medicaid to offer a free 14 week program    Vaping- Want to Quit? Free 24/7 support. Call [facility]  [place], [place], [place], [place], [place]  [facility]

## 2023-12-25 ENCOUNTER — Telehealth: Payer: Self-pay

## 2023-12-25 NOTE — Telephone Encounter (Signed)
Good Morning [place]  We have received a surgical clearance request for [name] for vocal cord surgical procedure. They were seen recently in clinic on 12/02/23. Can you please comment on surgical clearance for her upcoming procedure. Please forward you guidance and recommendations to P CV DIV PREOP   Thanks,  Robin Searing, NP

## 2023-12-25 NOTE — Telephone Encounter (Signed)
   Patient Name: [name]  DOB: 1951/06/10 MRN: [id number]  Primary Cardiologist: Charlton Haws, MD  Chart reviewed as part of pre-operative protocol coverage. Given past medical history and time since last visit, based on ACC/AHA guidelines, [name] is at acceptable risk for the planned procedure without further cardiovascular testing.   The patient was advised that if she develops new symptoms prior to surgery to contact our office to arrange for a follow-up visit, and she verbalized understanding.  Guidance for holding Plavix will need to come from prescribing provider and is currently not managed by cardiology.  I will route this recommendation to the requesting party via Epic fax function and remove from pre-op pool.  Please call with questions.  Napoleon Form, Leodis Rains, NP 12/25/2023, 11:27 AM

## 2023-12-25 NOTE — Telephone Encounter (Signed)
..     Pre-operative Risk Assessment    Patient Name: [name]  DOB: [birth year] MRN: [id number]   Date of last office visit: 12/02/23 Date of next office visit: NONE   Request for Surgical Clearance    Procedure:   VOCAL FOLD LEUKOPLAKIA EXCISION  Date of Surgery:  Clearance TBD                                Surgeon:  DR Ashok Croon Surgeon's Group or Practice Name:  [facility] Phone number:  [phone] Fax number:  [phone]   Type of Clearance Requested:   - Medical  - Pharmacy:  Hold Clopidogrel (Plavix)     Type of Anesthesia:  General    Additional requests/questions:    Jola Babinski   12/25/2023, 10:58 AM

## 2023-12-25 NOTE — Telephone Encounter (Signed)
    She has a history of mild to moderate nonobstructive disease and denied any recent anginal symptoms. Did report occasional palpitations but no persistent symptoms. She should not require further cardiac testing prior to her planned procedure. Would be of acceptable risk to proceed.  As discussed in the office note, she is on Plavix per Vascular Surgery and would defer to them in regards to holding this.  Signed, Ellsworth Lennox, PA-C 12/25/2023, 11:24 AM

## 2023-12-29 ENCOUNTER — Ambulatory Visit (INDEPENDENT_AMBULATORY_CARE_PROVIDER_SITE_OTHER): Payer: 59 | Admitting: Gastroenterology

## 2023-12-31 ENCOUNTER — Encounter (INDEPENDENT_AMBULATORY_CARE_PROVIDER_SITE_OTHER): Payer: Self-pay | Admitting: Otolaryngology

## 2023-12-31 ENCOUNTER — Telehealth: Payer: Self-pay | Admitting: Family Medicine

## 2023-12-31 NOTE — Telephone Encounter (Signed)
I am contacting Vascular surgeon Dr Lesly Dukes regarding plavix and if it can be held and for how long prior to proposed vocal cord surgery. Pls fax form in  your box , sent from ENT to his nurse , with a  message to her adviinfg her of the form and follow up on reply  I will also send a staff message to Dr Chestine Spore so that he is aware Thank you

## 2024-01-05 ENCOUNTER — Encounter: Payer: Self-pay | Admitting: Family Medicine

## 2024-01-05 DIAGNOSIS — R197 Diarrhea, unspecified: Secondary | ICD-10-CM

## 2024-01-12 MED ORDER — DICYCLOMINE HCL 10 MG PO CAPS: ORAL_CAPSULE | ORAL | 0 refills | Status: DC

## 2024-01-12 NOTE — Addendum Note (Signed)
 Addended by: [name] on: 01/12/2024 09:02 AM   Modules accepted: Orders

## 2024-01-14 DIAGNOSIS — R030 Elevated blood-pressure reading, without diagnosis of hypertension: Secondary | ICD-10-CM | POA: Diagnosis not present

## 2024-01-14 DIAGNOSIS — M19071 Primary osteoarthritis, right ankle and foot: Secondary | ICD-10-CM | POA: Diagnosis not present

## 2024-01-14 DIAGNOSIS — Z79899 Other long term (current) drug therapy: Secondary | ICD-10-CM | POA: Diagnosis not present

## 2024-01-15 ENCOUNTER — Ambulatory Visit (HOSPITAL_COMMUNITY)
Admission: RE | Admit: 2024-01-15 | Discharge: 2024-01-15 | Disposition: A | Discharge status: Home / Self Care | Payer: Medicare HMO | Source: Ambulatory Visit | Attending: Otolaryngology | Admitting: Otolaryngology

## 2024-01-15 DIAGNOSIS — F17200 Nicotine dependence, unspecified, uncomplicated: Secondary | ICD-10-CM | POA: Insufficient documentation

## 2024-01-15 DIAGNOSIS — J383 Other diseases of vocal cords: Secondary | ICD-10-CM | POA: Insufficient documentation

## 2024-01-15 DIAGNOSIS — I6523 Occlusion and stenosis of bilateral carotid arteries: Secondary | ICD-10-CM | POA: Diagnosis not present

## 2024-01-15 DIAGNOSIS — R490 Dysphonia: Secondary | ICD-10-CM | POA: Insufficient documentation

## 2024-01-15 DIAGNOSIS — C3491 Malignant neoplasm of unspecified part of right bronchus or lung: Secondary | ICD-10-CM | POA: Diagnosis not present

## 2024-01-15 DIAGNOSIS — I700 Atherosclerosis of aorta: Secondary | ICD-10-CM | POA: Diagnosis not present

## 2024-01-15 MED ORDER — IOHEXOL 300 MG/ML  SOLN
75.0000 mL | Freq: Once | INTRAMUSCULAR | Status: AC | PRN
  Administered 2024-01-15: 75 mL via INTRAVENOUS

## 2024-01-20 ENCOUNTER — Encounter: Payer: Self-pay | Admitting: Family Medicine

## 2024-01-20 NOTE — Telephone Encounter (Signed)
 Do you have this form?

## 2024-01-24 ENCOUNTER — Encounter (INDEPENDENT_AMBULATORY_CARE_PROVIDER_SITE_OTHER): Payer: Self-pay | Admitting: Otolaryngology

## 2024-01-25 ENCOUNTER — Telehealth (INDEPENDENT_AMBULATORY_CARE_PROVIDER_SITE_OTHER): Payer: Self-pay | Admitting: Otolaryngology

## 2024-01-25 NOTE — Telephone Encounter (Signed)
 The patient would like a call with neck neck CT results.

## 2024-01-27 ENCOUNTER — Telehealth (INDEPENDENT_AMBULATORY_CARE_PROVIDER_SITE_OTHER): Payer: Self-pay

## 2024-01-27 NOTE — Telephone Encounter (Signed)
 Spoke to the patient and let her know we are waiting on the scan to be read, I have requested a stat reading from radiology.  I told her we would call as soon as it was ready, I let her know at the latest we should have it before her appointment on Monday the 24th.

## 2024-01-28 ENCOUNTER — Encounter (INDEPENDENT_AMBULATORY_CARE_PROVIDER_SITE_OTHER): Payer: Self-pay | Admitting: Otolaryngology

## 2024-02-01 ENCOUNTER — Encounter (INDEPENDENT_AMBULATORY_CARE_PROVIDER_SITE_OTHER): Payer: Self-pay | Admitting: Otolaryngology

## 2024-02-01 ENCOUNTER — Ambulatory Visit (INDEPENDENT_AMBULATORY_CARE_PROVIDER_SITE_OTHER): Admitting: Otolaryngology

## 2024-02-01 VITALS — BP 149/66 | HR 79

## 2024-02-01 DIAGNOSIS — R490 Dysphonia: Secondary | ICD-10-CM | POA: Diagnosis not present

## 2024-02-01 DIAGNOSIS — J383 Other diseases of vocal cords: Secondary | ICD-10-CM

## 2024-02-01 DIAGNOSIS — R042 Hemoptysis: Secondary | ICD-10-CM | POA: Diagnosis not present

## 2024-02-01 DIAGNOSIS — F17200 Nicotine dependence, unspecified, uncomplicated: Secondary | ICD-10-CM

## 2024-02-01 DIAGNOSIS — J342 Deviated nasal septum: Secondary | ICD-10-CM

## 2024-02-01 DIAGNOSIS — J343 Hypertrophy of nasal turbinates: Secondary | ICD-10-CM

## 2024-02-01 DIAGNOSIS — F17210 Nicotine dependence, cigarettes, uncomplicated: Secondary | ICD-10-CM | POA: Diagnosis not present

## 2024-02-01 NOTE — Progress Notes (Signed)
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 Consider referral to pulmonologist if bleeding recurs for bronchoscopy  Ear drainage No current ear drainage, decreased hearing aid function, clean ear canals on exam, no middle ear effusion. 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 - Continue annual lung cancer surveillance with scans. - if hemoptysis returns, will consider referral to [facility] for bronchoscopy  Tobacco use disorder.  We had an extensive discussion  about detrimental effects of smoking on overall health. I provided resources available at [facility] to assist with smoking cessation. I spent 4 min on counseling  - Advised smoking  cessation  Follow-up Scheduled for procedure on the 16th, anesthesia to review need for preoperative visit. - Attend procedure on the 16th. - Await potential call for preoperative appointment.   Artice Last, MD Otolaryngology [facility] Phone: [phone] Fax: [phone]    02/01/2024, 1:43 PM

## 2024-02-01 NOTE — H&P (View-Only) (Signed)
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 - Monitor for recurrence of bleeding. - Consider referral to pulmonologist if bleeding recurs for bronchoscopy  Ear drainage No current ear drainage, decreased hearing aid function, clean ear canals on exam, no middle ear effusion. 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 - Continue annual lung cancer surveillance with scans. - if hemoptysis returns, will consider referral to [facility] for bronchoscopy  Tobacco use disorder.  We had an extensive discussion  about detrimental effects of smoking on overall health. I provided resources available at [facility] to assist with smoking cessation. I spent 4 min on counseling  - Advised smoking  cessation  Follow-up Scheduled for procedure on the 16th, anesthesia to review need for preoperative visit. - Attend procedure on the 16th. - Await potential call for preoperative appointment.   Artice Last, MD Otolaryngology [facility] Phone: [phone] Fax: [phone]    02/01/2024, 1:43 PM

## 2024-02-12 DIAGNOSIS — R300 Dysuria: Secondary | ICD-10-CM | POA: Diagnosis not present

## 2024-02-12 DIAGNOSIS — E559 Vitamin D deficiency, unspecified: Secondary | ICD-10-CM | POA: Diagnosis not present

## 2024-02-12 DIAGNOSIS — M19071 Primary osteoarthritis, right ankle and foot: Secondary | ICD-10-CM | POA: Diagnosis not present

## 2024-02-12 DIAGNOSIS — R030 Elevated blood-pressure reading, without diagnosis of hypertension: Secondary | ICD-10-CM | POA: Diagnosis not present

## 2024-02-12 DIAGNOSIS — G629 Polyneuropathy, unspecified: Secondary | ICD-10-CM | POA: Diagnosis not present

## 2024-02-12 DIAGNOSIS — Z79899 Other long term (current) drug therapy: Secondary | ICD-10-CM | POA: Diagnosis not present

## 2024-02-14 ENCOUNTER — Other Ambulatory Visit: Payer: Self-pay | Admitting: Family Medicine

## 2024-02-16 ENCOUNTER — Ambulatory Visit (INDEPENDENT_AMBULATORY_CARE_PROVIDER_SITE_OTHER): Payer: 59 | Admitting: Vascular Surgery

## 2024-02-16 ENCOUNTER — Ambulatory Visit (HOSPITAL_COMMUNITY)
Admission: RE | Admit: 2024-02-16 | Discharge: 2024-02-16 | Disposition: A | Discharge status: Home / Self Care | Payer: 59 | Source: Ambulatory Visit | Attending: Vascular Surgery | Admitting: Vascular Surgery

## 2024-02-16 ENCOUNTER — Encounter: Payer: Self-pay | Admitting: Vascular Surgery

## 2024-02-16 ENCOUNTER — Ambulatory Visit (INDEPENDENT_AMBULATORY_CARE_PROVIDER_SITE_OTHER)
Admission: RE | Admit: 2024-02-16 | Discharge: 2024-02-16 | Disposition: A | Discharge status: Home / Self Care | Payer: 59 | Source: Ambulatory Visit | Attending: Vascular Surgery | Admitting: Vascular Surgery

## 2024-02-16 VITALS — BP 138/74 | HR 85 | Temp 97.7°F | Resp 20 | Ht 63.0 in | Wt 124.9 lb

## 2024-02-16 DIAGNOSIS — I719 Aortic aneurysm of unspecified site, without rupture: Secondary | ICD-10-CM | POA: Diagnosis not present

## 2024-02-16 DIAGNOSIS — I70219 Atherosclerosis of native arteries of extremities with intermittent claudication, unspecified extremity: Secondary | ICD-10-CM

## 2024-02-16 LAB — VAS US ABI WITH/WO TBI
Left ABI: 1.12
Right ABI: 1.21
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
## 2024-02-19 ENCOUNTER — Other Ambulatory Visit: Payer: Self-pay | Admitting: *Deleted

## 2024-02-19 DIAGNOSIS — I70219 Atherosclerosis of native arteries of extremities with intermittent claudication, unspecified extremity: Secondary | ICD-10-CM

## 2024-02-23 ENCOUNTER — Encounter (HOSPITAL_COMMUNITY): Payer: Self-pay | Admitting: *Deleted

## 2024-02-23 ENCOUNTER — Other Ambulatory Visit: Payer: Self-pay

## 2024-02-23 NOTE — Anesthesia Preprocedure Evaluation (Addendum)
 Anesthesia Evaluation  Patient identified by MRN, date of birth, ID band Patient awake    Reviewed: Allergy & Precautions, Patient's Chart, lab work & pertinent test results  History of Anesthesia Complications (+) history of anesthetic complications (issues breathing when flat after surgery in 2014 @ [facility])  Airway Mallampati: II  TM Distance: >3 FB Neck ROM: Full    Dental no notable dental hx.    Pulmonary sleep apnea (noncompliant w/ cpap) , COPD,  oxygen dependent, Current Smoker and Patient abstained from smoking. 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 CT showed thickening of the epiglottis with two small nonspecific nodular/polypoid lesions at the vallecula.  Lung ca- RLL SCC lung cancer (s/p right VATS/RL lobectomy 07/25/15), OSA (no CPAP, uses 2L Q HS) 49 pack year history   Pulmonary exam normal        Cardiovascular hypertension, Pt. on medications + CAD (elevated CAC 2260, 99th percentile; no significant coronary stenosis by Coronary CT FFR 12/19/22), + Peripheral Vascular Disease and + DOE  + dysrhythmias (s/p SVT ablation 2014 and 03/2023) + Valvular Problems/Murmurs (mild MR, mild TR) MR  Rhythm:Regular Rate:Normal  Echo 10/01/2021 ([facility]): INTERPRETATION  NORMAL LEFT VENTRICULAR SYSTOLIC FUNCTION  NORMAL RIGHT VENTRICULAR SYSTOLIC FUNCTION  NO VALVULAR STENOSIS  MILD MR, TR  EF >55%     Neuro/Psych  Headaches PSYCHIATRIC DISORDERS Anxiety Depression       GI/Hepatic hiatal hernia, PUD,GERD  Controlled,,(+) Hepatitis - (treated 1993), C  Endo/Other  negative endocrine ROS    Renal/GU negative Renal ROS  negative genitourinary   Musculoskeletal  (+) Arthritis , Osteoarthritis,  Fibromyalgia -  Abdominal Normal abdominal exam  (+)   Peds  Hematology negative hematology ROS (+)    Anesthesia Other Findings   Reproductive/Obstetrics negative OB ROS                             Anesthesia Physical Anesthesia Plan  ASA: 4  Anesthesia Plan: General   Post-op Pain Management: Tylenol PO (pre-op)*   Induction: Intravenous  PONV Risk Score and Plan: 3 and Ondansetron, Dexamethasone and Treatment may vary due to age or medical condition  Airway Management Planned: Oral ETT and Mask  Additional Equipment: None  Intra-op Plan:   Post-operative Plan: Extubation in OR  Informed Consent: I have reviewed the patients History and Physical, chart, labs and discussed the procedure including the risks, benefits and alternatives for the proposed anesthesia with the patient or authorized representative who has indicated his/her understanding and acceptance.     Dental advisory given  Plan Discussed with: CRNA  Anesthesia Plan Comments: (Laser ETT )       Anesthesia Quick Evaluation

## 2024-02-23 NOTE — Progress Notes (Signed)
 PCP - Dr. Alberteen Huge Cardiologist - Dr. Janelle Mediate EP- Dr. Manya Sells  PPM/ICD - denies   Chest x-ray - 01/05/23 EKG - 07/10/23 Stress Test - 04/18/13 ECHO - 01/28/12 Cardiac Cath - denies  CPAP - OSA+, no CPAP, pt wears 2L o2 at bedtime  DM- denies  Blood Thinner Instructions: Hold Plavix 3 days. Pt states she was not instructed by anyone on how long to hold, so she took her last dose on 4/10  Aspirin Instructions: n/a  ERAS Protcol - NPO  COVID TEST- n/a  Anesthesia review: yes, cardiac clearance, O2 2L at bedtime  Patient verbally denies any shortness of breath, fever, cough and chest pain during phone call      Questions were answered. Patient verbalized understanding of instructions.
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
## 2024-02-23 NOTE — Pre-Procedure Instructions (Signed)
-------------    SDW INSTRUCTIONS given:  Your procedure is scheduled on 4/16.  Report to [facility] Main Entrance "A" at 06:00 A.M., and check in at the Admitting office.  Any questions or running late day of surgery: call [phone]    Remember:  Do not eat or drink after midnight the night before your surgery     Take these medicines the morning of surgery with A SIP OF WATER  hydralazine, norco, metoprolol, rosuvastatin, ezetimibe (ZETIA)      May take these medicines IF NEEDED: zyrtec, doxycycline, EPI   As of today, STOP taking any Aspirin (unless otherwise instructed by your surgeon) Aleve, Naproxen, Ibuprofen, Motrin, Advil, Goody's, BC's, all herbal medications, fish oil, and all vitamins.   Do NOT Smoke (Tobacco/Vaping) 24 hours prior to your procedure  If you use a CPAP at night, you may bring all equipment for your overnight stay.     You will be asked to remove any contacts, glasses, piercing's, hearing aid's, dentures/partials prior to surgery. Please bring cases for these items if needed.     Patients discharged the day of surgery will not be allowed to drive home, and someone needs to stay with them for 24 hours.  SURGICAL WAITING ROOM VISITATION Patients may have no more than 2 support people in the waiting area - these visitors may rotate.   Pre-op nurse will coordinate an appropriate time for 1 ADULT support person, who may not rotate, to accompany patient in pre-op.  Children under the age of 77 must have an adult with them who is not the patient and must remain in the main waiting area with an adult.  If the patient needs to stay at the hospital during part of their recovery, the visitor guidelines for inpatient rooms apply.  Please refer to the [facility] website for the visitor guidelines for any additional information.   Special instructions:   [place]- Preparing For Surgery   Please follow these instructions carefully.   Shower the NIGHT  BEFORE SURGERY and the MORNING OF SURGERY with DIAL Soap.   Pat yourself dry with a CLEAN TOWEL.  Wear CLEAN PAJAMAS to bed the night before surgery  Place CLEAN SHEETS on your bed the night of your first shower and DO NOT SLEEP WITH PETS.   Additional instructions for the day of surgery: DO NOT APPLY any lotions, deodorants, cologne, or perfumes.   Do not wear jewelry or makeup Do not wear nail polish, gel polish, artificial nails, or any other type of covering on natural nails (fingers and toes) Do not bring valuables to the hospital. [facility] is not responsible for valuables/personal belongings. Put on clean/comfortable clothes.  Please brush your teeth.  Ask your nurse before applying any prescription medications to the skin.

## 2024-02-24 ENCOUNTER — Other Ambulatory Visit: Payer: Self-pay

## 2024-02-24 ENCOUNTER — Ambulatory Visit (HOSPITAL_BASED_OUTPATIENT_CLINIC_OR_DEPARTMENT_OTHER): Admitting: Vascular Surgery

## 2024-02-24 ENCOUNTER — Ambulatory Visit (HOSPITAL_COMMUNITY): Admitting: Vascular Surgery

## 2024-02-24 ENCOUNTER — Ambulatory Visit (HOSPITAL_COMMUNITY)
Admission: RE | Admit: 2024-02-24 | Discharge: 2024-02-24 | Disposition: A | Discharge status: Home / Self Care | Attending: Otolaryngology | Admitting: Otolaryngology

## 2024-02-24 ENCOUNTER — Encounter (HOSPITAL_COMMUNITY)
Admission: RE | Disposition: A | Discharge status: Home / Self Care | Payer: Self-pay | Source: Home / Self Care | Attending: Otolaryngology

## 2024-02-24 ENCOUNTER — Encounter (HOSPITAL_COMMUNITY): Payer: Self-pay | Admitting: *Deleted

## 2024-02-24 DIAGNOSIS — R490 Dysphonia: Secondary | ICD-10-CM | POA: Diagnosis present

## 2024-02-24 DIAGNOSIS — I700 Atherosclerosis of aorta: Secondary | ICD-10-CM | POA: Insufficient documentation

## 2024-02-24 DIAGNOSIS — D020 Carcinoma in situ of larynx: Secondary | ICD-10-CM | POA: Insufficient documentation

## 2024-02-24 DIAGNOSIS — I10 Essential (primary) hypertension: Secondary | ICD-10-CM | POA: Insufficient documentation

## 2024-02-24 DIAGNOSIS — J439 Emphysema, unspecified: Secondary | ICD-10-CM | POA: Diagnosis not present

## 2024-02-24 DIAGNOSIS — J383 Other diseases of vocal cords: Secondary | ICD-10-CM

## 2024-02-24 DIAGNOSIS — I2510 Atherosclerotic heart disease of native coronary artery without angina pectoris: Secondary | ICD-10-CM

## 2024-02-24 DIAGNOSIS — J449 Chronic obstructive pulmonary disease, unspecified: Secondary | ICD-10-CM | POA: Diagnosis not present

## 2024-02-24 DIAGNOSIS — M797 Fibromyalgia: Secondary | ICD-10-CM | POA: Diagnosis not present

## 2024-02-24 DIAGNOSIS — F17210 Nicotine dependence, cigarettes, uncomplicated: Secondary | ICD-10-CM

## 2024-02-24 DIAGNOSIS — Q318 Other congenital malformations of larynx: Secondary | ICD-10-CM

## 2024-02-24 DIAGNOSIS — F17211 Nicotine dependence, cigarettes, in remission: Secondary | ICD-10-CM

## 2024-02-24 HISTORY — PX: MICROLARYNGOSCOPY WITH CO2 LASER AND EXCISION OF VOCAL CORD LESION: SHX5970

## 2024-02-24 HISTORY — PX: RIGID BRONCHOSCOPY: SHX5069

## 2024-02-24 LAB — BASIC METABOLIC PANEL WITH GFR
Anion gap: 10 (ref 5–15)
BUN: 14 mg/dL (ref 8–23)
CO2: 26 mmol/L (ref 22–32)
Calcium: 9.4 mg/dL (ref 8.9–10.3)
Chloride: 104 mmol/L (ref 98–111)
Creatinine, Ser: 0.67 mg/dL (ref 0.44–1.00)
GFR, Estimated: >60 mL/min (ref >60)
Glucose, Bld: 95 mg/dL (ref 70–99)
Potassium: 4.7 mmol/L (ref 3.5–5.1)
Sodium: 140 mmol/L (ref 135–145)

## 2024-02-24 LAB — CBC
HCT: 41.1 % (ref 36.0–46.0)
Hemoglobin: 13.4 g/dL (ref 12.0–15.0)
MCH: 31.4 pg (ref 26.0–34.0)
MCHC: 32.6 g/dL (ref 30.0–36.0)
MCV: 96.3 fL (ref 80.0–100.0)
Platelets: 247 10*3/uL (ref 150–400)
RBC: 4.27 MIL/uL (ref 3.87–5.11)
RDW: 13.8 % (ref 11.5–15.5)
WBC: 11.1 10*3/uL — ABNORMAL HIGH (ref 4.0–10.5)
nRBC: 0 % (ref 0.0–0.2)

## 2024-02-24 SURGERY — BRONCHOSCOPY, RIGID
Anesthesia: General

## 2024-02-24 MED ORDER — OXYMETAZOLINE HCL 0.05 % NA SOLN
NASAL | Status: DC | PRN
  Administered 2024-02-24: 1 via TOPICAL

## 2024-02-24 MED ORDER — ONDANSETRON HCL 4 MG/2ML IJ SOLN
INTRAMUSCULAR | Status: AC
  Filled 2024-02-24: qty 2

## 2024-02-24 MED ORDER — PROPOFOL 500 MG/50ML IV EMUL
INTRAVENOUS | Status: DC | PRN
  Administered 2024-02-24: 100 ug/kg/min via INTRAVENOUS

## 2024-02-24 MED ORDER — ROCURONIUM BROMIDE 10 MG/ML (PF) SYRINGE
PREFILLED_SYRINGE | INTRAVENOUS | Status: DC | PRN
  Administered 2024-02-24: 40 mg via INTRAVENOUS
  Administered 2024-02-24: 20 mg via INTRAVENOUS

## 2024-02-24 MED ORDER — OXYMETAZOLINE HCL 0.05 % NA SOLN
NASAL | Status: AC
  Filled 2024-02-24: qty 30

## 2024-02-24 MED ORDER — CHLORHEXIDINE GLUCONATE 0.12 % MT SOLN
OROMUCOSAL | Status: AC
  Administered 2024-02-24: 15 mL via OROMUCOSAL
  Filled 2024-02-24: qty 15

## 2024-02-24 MED ORDER — LIDOCAINE 2% (20 MG/ML) 5 ML SYRINGE
INTRAMUSCULAR | Status: DC | PRN
  Administered 2024-02-24: 40 mg via INTRAVENOUS

## 2024-02-24 MED ORDER — FENTANYL CITRATE (PF) 100 MCG/2ML IJ SOLN
25.0000 ug | INTRAMUSCULAR | Status: DC | PRN
Start: 2024-02-24 — End: 2024-02-24

## 2024-02-24 MED ORDER — FENTANYL CITRATE (PF) 250 MCG/5ML IJ SOLN
INTRAMUSCULAR | Status: DC | PRN
Start: 2024-02-24 — End: 2024-02-24
  Administered 2024-02-24: 50 ug via INTRAVENOUS
  Administered 2024-02-24 (×2): 100 ug via INTRAVENOUS

## 2024-02-24 MED ORDER — SUGAMMADEX SODIUM 200 MG/2ML IV SOLN
INTRAVENOUS | Status: DC | PRN
  Administered 2024-02-24: 120 mg via INTRAVENOUS

## 2024-02-24 MED ORDER — CHLORHEXIDINE GLUCONATE 0.12 % MT SOLN: 15.0000 mL | Freq: Once | OROMUCOSAL | Status: AC

## 2024-02-24 MED ORDER — LIDOCAINE 2% (20 MG/ML) 5 ML SYRINGE
INTRAMUSCULAR | Status: AC
  Filled 2024-02-24: qty 5

## 2024-02-24 MED ORDER — SODIUM CHLORIDE 0.9 % IV SOLN
0.1500 ug/kg/min | INTRAVENOUS | Status: DC
  Administered 2024-02-24: .1 ug/kg/min via INTRAVENOUS
  Filled 2024-02-24: qty 2000

## 2024-02-24 MED ORDER — 0.9 % SODIUM CHLORIDE (POUR BTL) OPTIME
TOPICAL | Status: DC | PRN
  Administered 2024-02-24: 1000 mL

## 2024-02-24 MED ORDER — EPINEPHRINE HCL (NASAL) 0.1 % NA SOLN
NASAL | Status: AC
  Filled 2024-02-24: qty 30

## 2024-02-24 MED ORDER — EPINEPHRINE HCL (NASAL) 0.1 % NA SOLN
NASAL | Status: DC | PRN
  Administered 2024-02-24: 1 [drp] via TOPICAL

## 2024-02-24 MED ORDER — PHENYLEPHRINE 80 MCG/ML (10ML) SYRINGE FOR IV PUSH (FOR BLOOD PRESSURE SUPPORT)
PREFILLED_SYRINGE | INTRAVENOUS | Status: DC | PRN
  Administered 2024-02-24: 160 ug via INTRAVENOUS

## 2024-02-24 MED ORDER — LACTATED RINGERS IV SOLN: INTRAVENOUS | Status: DC

## 2024-02-24 MED ORDER — LIDOCAINE-EPINEPHRINE 1 %-1:100000 IJ SOLN
INTRAMUSCULAR | Status: AC
  Filled 2024-02-24: qty 1

## 2024-02-24 MED ORDER — PROPOFOL 10 MG/ML IV BOLUS
INTRAVENOUS | Status: AC
  Filled 2024-02-24: qty 20

## 2024-02-24 MED ORDER — PROPOFOL 10 MG/ML IV BOLUS
INTRAVENOUS | Status: DC | PRN
  Administered 2024-02-24: 100 mg via INTRAVENOUS

## 2024-02-24 MED ORDER — DEXAMETHASONE SODIUM PHOSPHATE 10 MG/ML IJ SOLN
INTRAMUSCULAR | Status: DC | PRN
Start: 2024-02-24 — End: 2024-02-24
  Administered 2024-02-24: 10 mg via INTRAVENOUS

## 2024-02-24 MED ORDER — PHENYLEPHRINE HCL-NACL 20-0.9 MG/250ML-% IV SOLN
INTRAVENOUS | Status: DC | PRN
  Administered 2024-02-24: 50 ug/min via INTRAVENOUS

## 2024-02-24 MED ORDER — ORAL CARE MOUTH RINSE: 15.0000 mL | Freq: Once | OROMUCOSAL | Status: AC

## 2024-02-24 MED ORDER — ONDANSETRON HCL 4 MG/2ML IJ SOLN: 4.0000 mg | Freq: Once | INTRAMUSCULAR | Status: DC | PRN

## 2024-02-24 MED ORDER — FENTANYL CITRATE (PF) 250 MCG/5ML IJ SOLN
INTRAMUSCULAR | Status: AC
  Filled 2024-02-24: qty 5

## 2024-02-24 MED ORDER — ROCURONIUM BROMIDE 10 MG/ML (PF) SYRINGE
PREFILLED_SYRINGE | INTRAVENOUS | Status: AC
  Filled 2024-02-24: qty 10

## 2024-02-24 MED ORDER — FAMOTIDINE 20 MG PO TABS: 20.0000 mg | ORAL_TABLET | Freq: Two times a day (BID) | ORAL | 1 refills | Status: DC

## 2024-02-24 MED ORDER — ONDANSETRON HCL 4 MG/2ML IJ SOLN
INTRAMUSCULAR | Status: DC | PRN
  Administered 2024-02-24: 4 mg via INTRAVENOUS

## 2024-02-24 MED ORDER — DEXAMETHASONE SODIUM PHOSPHATE 10 MG/ML IJ SOLN
INTRAMUSCULAR | Status: AC
  Filled 2024-02-24: qty 1

## 2024-02-24 SURGICAL SUPPLY — 28 items
BAG COUNTER SPONGE SURGICOUNT (BAG) ×2 IMPLANT
CANISTER SUCT 3000ML PPV (MISCELLANEOUS) ×2 IMPLANT
CNTNR URN SCR LID CUP LEK RST (MISCELLANEOUS) IMPLANT
COVER BACK TABLE 60X90IN (DRAPES) ×2 IMPLANT
COVER MAYO STAND STRL (DRAPES) ×2 IMPLANT
DRAPE HALF SHEET 40X57 (DRAPES) ×2 IMPLANT
DRSG TELFA 3X8 NADH STRL (GAUZE/BANDAGES/DRESSINGS) ×2 IMPLANT
GAUZE SPONGE 4X4 12PLY STRL (GAUZE/BANDAGES/DRESSINGS) ×2 IMPLANT
GLOVE BIO SURGEON STRL SZ 6 (GLOVE) ×2 IMPLANT
GOWN STRL REUS W/TWL LRG LVL3 (GOWN DISPOSABLE) ×4 IMPLANT
GUARD TEETH (MISCELLANEOUS) IMPLANT
KIT BASIN OR (CUSTOM PROCEDURE TRAY) ×2 IMPLANT
KIT TURNOVER KIT B (KITS) ×2 IMPLANT
MARKER SKIN DUAL TIP RULER LAB (MISCELLANEOUS) IMPLANT
NDL HYPO 25GX1X1/2 BEV (NEEDLE) IMPLANT
NEEDLE HYPO 25GX1X1/2 BEV (NEEDLE) IMPLANT
NS IRRIG 1000ML POUR BTL (IV SOLUTION) ×2 IMPLANT
PAD ARMBOARD POSITIONER FOAM (MISCELLANEOUS) ×4 IMPLANT
PATTIES SURGICAL .5 X.5 (GAUZE/BANDAGES/DRESSINGS) ×2 IMPLANT
POSITIONER HEAD DONUT 9IN (MISCELLANEOUS) ×2 IMPLANT
SET COLLECT BLD 25X3/4 12 (NEEDLE) IMPLANT
SOL ANTI FOG 6CC (MISCELLANEOUS) ×2 IMPLANT
SURGILUBE 2OZ TUBE FLIPTOP (MISCELLANEOUS) ×2 IMPLANT
SYR 3ML LL SCALE MARK (SYRINGE) IMPLANT
SYR TB 1ML LUER SLIP (SYRINGE) IMPLANT
TOWEL GREEN STERILE FF (TOWEL DISPOSABLE) ×4 IMPLANT
TUBE CONNECTING 12X1/4 (SUCTIONS) ×4 IMPLANT
WATER STERILE IRR 1000ML POUR (IV SOLUTION) ×2 IMPLANT

## 2024-02-24 NOTE — Anesthesia Postprocedure Evaluation (Signed)
 Anesthesia Post Note  Patient: [name]  Procedure(s) Performed: BRONCHOSCOPY, RIGID MICROLARYNGOSCOPY WITH CO2 LASER AND EXCISION OF VOCAL CORD LESION     Patient location during evaluation: PACU Anesthesia Type: General Level of consciousness: awake and alert Pain management: pain level controlled Vital Signs Assessment: post-procedure vital signs reviewed and stable Respiratory status: spontaneous breathing, nonlabored ventilation, respiratory function stable and patient connected to nasal cannula oxygen Cardiovascular status: blood pressure returned to baseline and stable Postop Assessment: no apparent nausea or vomiting Anesthetic complications: no   No notable events documented.  Last Vitals:  Vitals:   02/24/24 1015 02/24/24 1030  BP: (!) 154/75 (!) 159/67  Pulse: 74 70  Resp: 18 19  Temp:  (!) 36.4 C  SpO2: 94% 92%    Last Pain:  Vitals:   02/24/24 1000  TempSrc:   PainSc: 6                  Dierdra Salameh P Resha Filippone

## 2024-02-24 NOTE — Interval H&P Note (Signed)
 History and Physical Interval Note:  02/24/2024 6:53 AM  [name]  has presented today for surgery, with the diagnosis of Vocal fold lesions.  The various methods of treatment have been discussed with the patient and family. After consideration of risks, benefits and other options for treatment, the patient has consented to  Procedure(s): BRONCHOSCOPY, RIGID (N/A) MICROLARYNGOSCOPY WITH CO2 LASER AND EXCISION OF VOCAL CORD LESION (N/A) as a surgical intervention.  The patient's history has been reviewed, patient examined, no change in status, stable for surgery.  I have reviewed the patient's chart and labs.  Questions were answered to the patient's satisfaction.     [name]

## 2024-02-24 NOTE — Discharge Instructions (Addendum)
 POST-OP Instructions  Vocal Cord Surgery This post-operative instruction sheet is designed to help you care for your voice/throat after surgery and help answer any of the common questions you may have. It is not entirely comprehensive, so if you have any questions, do not hesitate to call the office.   What to Expect: - It is common to have a sore throat for several days after surgery. You may have some tongue numbness/pain or taste changes as well - these are temporary but can take several weeks to resolve. - You can eat and drink anything you normally do - there are no restrictions due to surgery alone.  If you have been recommended any other type of diet, such as an acid reflux diet, you should continue that.  You may want to eat light meals the day of anesthesia to make sure you don't get nauseated. You may have some pain after surgery, even with pain medications. To make this pain more tolerable, you should use Tylenol and/or ibuprofen. You can take 500mg -600mg  of tylenol every 6 hours, alternating with to 400-600mg  of ibuprofen every 6 hours, so long as your regular doctor hasn't told you to avoid either of these medications. If you don't take acid reflux medication already, you should add a dose of famotidine 20mg  with any ibuprofen dose over 400mg . If you're not sure whether these medications are safe for you, you should ask Dr Irene Pap or your regular doctor first (particularly if you have a heart condition or history of stomach ulcers or are on blood thinners). If it is more severe pain that doesn't respond to the medications above, you should request or use the prescription narcotic pain medicine you were given. (You can call the office to request a script if you need one.)  If you have to use narcotics, plan to use a stool softener as well.  - It is very important that you stay well-hydrated and drink plenty of fluids during the recovery period.  Getting dehydrated tends to worsen  post-operative pain. - Avoid tobacco products, spicy foods, excessive alcohol, or eating late at night as these may cause heartburn or reflux of stomach acid into the throat and may delay the healing process.  If you are on reflux medication already (such as Prilosec (omeprazole) or Nexium (esomeprazole)), continue it after surgery.  If you are not on anything regularly and have reflux symptoms in the post-operative period (heartburn, throat burning, excessive throat mucous or throat-clearing, sensation of a lump in your throat) then you can treat these symptoms with over-the-counter Pepcid 20-40mg . - Avoid coughing or throat clearing. If you have the urge to clear your throat, take a sip of water and a hard swallow instead. Drinking plenty of water can help alleviate the urge to clear the throat.  If you cannot control your cough, you may take the narcotic pain medicine as a cough suppressant - it works similar to codeine cough syrup to help decrease cough.  If this doesn't help or the cough is excessive, please call the office. - You can return to normal activity 24-48 hours after surgery; avoid intense exercise for the first week. - Call the office if you experience any of the following: Fever higher than 101 F Complete loss of voice Difficulty breathing or swallowing Bleeding from the mouth  Voice Use After Surgery: - Follow the voice rest instructions. Rest your voice for 48 hrs and this means that you need to use dry erase board or texting to communicate.  After 48 hrs, start to slowly use your voice and do some glides with your voice, gentle singing to warm up.   Nutrition: - Some patients have less of an appetite after surgery, and it is ok to decrease food intake. However, it is not ok to decrease fluid intake. It is very important to continue to drink plenty of fluids. - Drinking fluids will help lessen throat discomfort. -You can eat and drink as you normally do, but limit any foods that  might cause acid reflux (fried foods, meat, caffeine or alcohol, acidic sauces or fruits).  You may want to eat light meals the day of anesthesia to make sure you don't get nauseated.  Safety Information for Giving and Taking Medications: - Each time you give a medication, read the label. - If your medication is in liquid form, do not measure liquid with a kitchen spoon. There are pediatric measuring devices available at the pharmacy. Ask for one when you get your prescription filled. - If you have questions, ask the pharmacist. - Do not take Tylenol for pain if you have a history of liver problems. Check with your primary care doctor if you are uncertain. - Do not take ibuprofen for pain if you have a history of stomach bleeding or ulcers and have been told to avoid Non-Steroidal Anti-Inflammatory Drugs (NSAIDs).  Certain blood thinners can also interact with ibuprofen.  Check with your primary care doctor if you are uncertain.  Follow-up Appointment: You should see Dr Rula Keniston approximately 2-3 weeks after your surgery. If you don't have an appointment, please call her office to schedule it.   You can resume all of your home medications following this surgery including blood thinner/Plavix

## 2024-02-24 NOTE — Op Note (Signed)
 Operative Report  PREOPERATIVE DIAGNOSIS: 1. Bilateral true vocal fold lesions/leukoplakia 2. Dysphonia 3.  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  History of smoking  POSTOPERATIVE DIAGNOSIS: 1. Bilateral true vocal fold lesions/leukoplakia 2. Dysphonia 3.  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  History of smoking   PROCEDURE: 1. Direct microlaryngoscopy with excision of bilateral true vocal cord lesions/vocal fold stripping with CO2 laser 2.Rigid bronchoscopy  SURGEON: Ashok Croon, MD  ASSISTANT: none  ANESTHESIA: General endotracheal with a laser safe tube  COMPLICATIONS: None.  ESTIMATED BLOOD LOSS: < 5 mL  INTRAOPERATIVE FINDINGS: 1. Difficult exposure requiring anterior neck pressure to see true vocal folds and anterior commissure  2.  Bilateral true vocal fold leukoplakia along nearly entire length of the true vocal fold primarily centered around mid third  SPECIMEN: 1. Left true vocal fold lesion/leukoplakia 2. 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  Her examination revealed bilateral true vocal fold leukoplakia. The patient was therefore offered the aforementioned procedures. The procedure, risks, benefits, alternatives, potential complications, possible outcomes as well as the option of no treatment were reviewed with the patient who indicated their understanding and wished to proceed forward with the procedure. Informed consent was obtained.  DESCRIPTION OF PROCEDURE: On 02/24/24, the patient was brought to the operating room by the anesthesia team. The patient was transferred onto the operating table and placed in supine position.  After smooth induction of general endotracheal anesthesia, a surgeon initiated time-out was performed. The bed had been rotated 90 degrees. The patient was then draped in the appropriate fashion. We first began by inserted a maxillary tooth guard over the maxillary dentition for protection during the procedure. The female Sataloff laryngoscope was then advanced into the oral cavity and oropharynx until the larynx was brought into view. The initial exposure was not adequate and we had to apply anterior pressure to bring anterior commissure into view. Once the larynx was adequately visualized the patient was placed into suspension. A 0 degree Hopkins endoscope were then used to visualize the larynx and photo documentation was obtained. Thorough examination including palpation revealed bilateral changes along the true vocal fold mucosa consistent with diffuse leukoplakia but the borders were easily identified on visual inspection.  These changes involve nearly entire length of both vocal cords but were centered around mid third. Rigid bronchoscopy did not reveal extension of the lesion along the infraglottic portion of the true vocal folds, subglottis or proximal trachea. Given these findings, we decided to proceed with excision.   We then proceeded with excision and vocal fold stripping portion of the procedure using a combination of the CO2 laser and microlaryngeal instruments. The operating microscope was brought into the field. Saline soaked blue towels were wrapped around the face and laryngoscope for protection. All operating personnel were confirmed to be wearing laser safe glasses.  A laser safety time out was then conducted in the usual standard fashion. We first began by utilizing the CO2 laser with the operating microscope. Settings of 2-3 Watts, superpulsed in continuous mode was used to first outline and then excise abnormal appearing true vocal fold mucosa and then ablate the site for hemostasis using  defocused laser beam for the latter.  Several specimens were removed (outlined above) and submitted for permanent histopathology. A small area along the anterior aspect of the true vocal folds was intentionally avoided to prevent a risk of post-operative web and scar formation. At the end of the excision and ablation for hemostasis, we used Afrin and Epinephrine soaked pledgets for hemostasis. Photo documentation was then once again obtained using the endoscope. The patient was then removed from suspension and the Sataloff laryngoscope was carefully removed without injury to the mucosa or dentition. The mouth guard was removed and the dentition was once again inspected with no evidence of injury to the teeth.  This completed the procedure. The patient tolerated the procedure well without any immediate post operative complications. All counts were correct x2 at the conclusion of the procedure. The specimen was sent off for permanent pathology.

## 2024-02-24 NOTE — Transfer of Care (Signed)
 Immediate Anesthesia Transfer of Care Note  Patient: [name]  Procedure(s) Performed: BRONCHOSCOPY, RIGID MICROLARYNGOSCOPY WITH CO2 LASER AND EXCISION OF VOCAL CORD LESION  Patient Location: PACU  Anesthesia Type:General  Level of Consciousness: awake, drowsy, patient cooperative, and responds to stimulation  Airway & Oxygen Therapy: Patient Spontanous Breathing and Patient connected to face mask oxygen  Post-op Assessment: Report given to RN and Post -op Vital signs reviewed and stable  Post vital signs: Reviewed and stable  Last Vitals:  Vitals Value Taken Time  BP 138/70 02/24/24 0955  Temp    Pulse 75 02/24/24 0956  Resp 17 02/24/24 0956  SpO2 100 % 02/24/24 0956  Vitals shown include unfiled device data.  Last Pain:  Vitals:   02/24/24 0715  TempSrc:   PainSc: 0-No pain      Patients Stated Pain Goal: 0 (02/24/24 0715)  Complications: No notable events documented.

## 2024-02-24 NOTE — Anesthesia Procedure Notes (Addendum)
 Procedure Name: Intubation Date/Time: 02/24/2024 8:45 AM  Performed by: Claud Crumb, CRNAPre-anesthesia Checklist: Patient identified, Emergency Drugs available, Suction available and Patient being monitored Patient Re-evaluated:Patient Re-evaluated prior to induction Oxygen Delivery Method: Circle System Utilized Preoxygenation: Pre-oxygenation with 100% oxygen Induction Type: IV induction Ventilation: Mask ventilation without difficulty Laryngoscope Size: Glidescope and 3 Grade View: Grade II Tube type: Oral Laser Tube: Laser Tube and Cuffed inflated with minimal occlusive pressure - saline Tube size: 7.0 mm Number of attempts: 1 Airway Equipment and Method: Stylet and Oral airway Placement Confirmation: ETT inserted through vocal cords under direct vision, positive ETCO2 and breath sounds checked- equal and bilateral Secured at: 21 cm Tube secured with: Tape Dental Injury: Teeth and Oropharynx as per pre-operative assessment  Difficulty Due To: Difficult Airway- due to anterior larynx Comments: Video laryngoscopy elective use given laser ETT placement verification.  Easy hand ventilation throughout induction.

## 2024-02-25 ENCOUNTER — Encounter (HOSPITAL_COMMUNITY): Payer: Self-pay | Admitting: Otolaryngology

## 2024-02-29 ENCOUNTER — Ambulatory Visit: Payer: Self-pay | Admitting: Family Medicine

## 2024-02-29 NOTE — Telephone Encounter (Signed)
 Chief Complaint: Difficulty breathing   Symptoms: SOB  Disposition: [x] ED [x] Refused Recommended Disposition   Additional Notes: Pt had surgery on 4/16 on her throat. Pt has an appointment on 4/29 with her surgeon. Pt audibly struggling to breathe and speaking in single words. This RN recommended ED but pt refused. This RN notified [facility] of pt refusal for ED disposition.    Copied from CRM [id number]. Topic: Clinical - Red Word Triage >> Feb 29, 2024  4:07 PM [name] B wrote: [facility] that prompted transfer to Nurse Triage:  Difficulty Breathing Reason for Disposition  SEVERE difficulty breathing (e.g., struggling for each breath, speaks in single words)  Answer Assessment - Initial Assessment Questions Chief Complaint: Difficulty breathing   Symptoms: SOB  Protocols used: Breathing Difficulty-A-AH

## 2024-02-29 NOTE — Telephone Encounter (Signed)
 Spoke to patient , patient refused ER recommendation

## 2024-03-01 ENCOUNTER — Ambulatory Visit: Payer: Medicare HMO | Attending: Student | Admitting: Student

## 2024-03-01 ENCOUNTER — Encounter: Payer: Self-pay | Admitting: Student

## 2024-03-01 VITALS — BP 126/68 | HR 76 | Ht 63.0 in | Wt 125.0 lb

## 2024-03-01 DIAGNOSIS — I6523 Occlusion and stenosis of bilateral carotid arteries: Secondary | ICD-10-CM | POA: Diagnosis not present

## 2024-03-01 DIAGNOSIS — E785 Hyperlipidemia, unspecified: Secondary | ICD-10-CM

## 2024-03-01 DIAGNOSIS — I2510 Atherosclerotic heart disease of native coronary artery without angina pectoris: Secondary | ICD-10-CM

## 2024-03-01 DIAGNOSIS — I10 Essential (primary) hypertension: Secondary | ICD-10-CM

## 2024-03-01 DIAGNOSIS — I471 Supraventricular tachycardia, unspecified: Secondary | ICD-10-CM | POA: Diagnosis not present

## 2024-03-01 DIAGNOSIS — I7123 Aneurysm of the descending thoracic aorta, without rupture: Secondary | ICD-10-CM

## 2024-03-01 NOTE — Progress Notes (Addendum)
 Cardiology Office Note    Date:  03/01/2024  ID:  MORRISON MASSER, DOB [birth year], MRN [id number] Cardiologist: Janelle Mediate, MD    History of Present Illness:    [name] is a 73 y.o. female with past medical history of CAD (s/p Coronary CTA in 12/2022 showing mild to moderate nonobstructive disease and not significant by FFR), paroxysmal atrial fibrillation/AVNRT (s/p ablation in 2014, repeat SVT ablation in 03/2023), carotid artery stenosis, PAD (s/p stenting of bilateral common iliac arteries in 06/2023), history of lung cancer (s/p RLL lobectomy), HTN, HLD, COPD and history of GI bleed who presents to the office today for 68-month follow-up.  She was last examined by myself in 11/2023 and reported more frequent palpitations and elevated blood pressure over the past few months. Also noted worsening anxiety and panic episodes during that timeframe. Options were reviewed in regards to a follow-up cardiac monitor vs. restarting beta-blocker therapy and she preferred medical therapy. Was started on Toprol -XL at 12.5 mg daily and reviewed this could be titrated to 25 mg daily within a few weeks pending symptoms.  In talking with the patient today, she expresses frustration regarding her recent vocal cord surgery. Says she was informed they were only going to treat one side at a time but instead, both were treated and she has barely been able to talk. Says she has to use a breathalyzer on her car due to having a DUI over 20+ years ago and she has not been able to drive as she cannot blow into the machine. She tried reaching out to Pulmonology in regards to a follow-up test they had recommended for a modified breathalyzer but says thy kept referring her to the ED. She has baseline dyspnea but no acute changes in this. No recent orthopnea, PND or pitting edema. Palpitations improved with restarting Toprol -XL 12.5mg  daily. Also repots cramps along her hands resolved after increasing her hydration. She  denies any chest pain but reports 2-3 episodes of jaw pain over the past 6 months and these episodes occur at rest and spontaneously resolve.   Studies Reviewed:   EKG: EKG is not ordered today.  Coronary CTA: 12/2022 IMPRESSION: 1. Heavy coronary calcification with probably mild to moderate mixed non-obstructive CAD, CADRADS = 3. CT FFR will be performed and reported separately.   2. Coronary calcium  score of 2260. This was 99th percentile for age and sex matched control.   3. Normal coronary origin with right dominance.   4. Heavy aortic atherosclerosis - particularly the descending aorta   5. Mild aortic valve leaflet and annular calcification mild posterior mitral annular calcification.    1. Left Main:  No significant stenosis. FFR = 1.00   2. LAD: No significant stenosis. Proximal FFR = 0.98, Mid FFR = 0.96, Distal FFR = not reported 3. LCX: No significant stenosis. Proximal FFR = 0.98, Distal FFR = 0.93 4. RCA: No significant stenosis. Proximal FFR = 1.00, Mid FFR = 0.92, Distal FFR = 0.91   IMPRESSION: 1.  CT FFR analysis did not show any significant stenosis.   Event Monitor: 01/2023 Patch Wear Time:  13 days and 1 hours (2024-02-28T13:53:49-0500 to 2024-03-12T16:11:11-398)   Patient had a min HR of 56 bpm, max HR of 231 bpm, and avg HR of 85 bpm. Predominant underlying rhythm was Sinus Rhythm. 397 Supraventricular Tachycardia runs occurred, the run with the fastest interval lasting 51.0 secs with a max rate of 231 bpm, the  longest lasting 3 mins 15 secs  with an avg rate of 186 bpm. Supraventricular Tachycardia was detected within +/- 45 seconds of symptomatic patient event(s). Isolated SVEs were frequent (7.7%, [id number]), SVE Couplets were occasional (2.0%, 15413), and SVE  Triplets were rare (<1.0%, 1459). Isolated VEs were rare (<1.0%), VE Couplets were rare (<1.0%), and no VE Triplets were present. Ventricular Bigeminy and Trigeminy were present. MD notification  criteria for Supraventricular Tachycardia met - report  posted prior to notification per account request (MJ).   Physical Exam:   VS:  BP 126/68 (BP Location: Right Arm, Cuff Size: Normal)   Pulse 76   Ht 5\' 3"  (1.6 m)   Wt 125 lb (56.7 kg)   SpO2 93%   BMI 22.14 kg/m    Wt Readings from Last 3 Encounters:  03/01/24 125 lb (56.7 kg)  02/24/24 124 lb (56.2 kg)  02/16/24 124 lb 14.4 oz (56.7 kg)     GEN: Well nourished, well developed female appearing in no acute distress NECK: No JVD; No carotid bruits CARDIAC: RRR, no murmurs, rubs, gallops RESPIRATORY:  Clear to auscultation without rales, wheezing or rhonchi  ABDOMEN: Appears non-distended. No obvious abdominal masses. EXTREMITIES: No clubbing or cyanosis. No pitting edema.  Distal pedal pulses are 2+ bilaterally.   Assessment and Plan:   1. CAD/HLD - Prior Coronary CTA in 2024 only showed mild to moderate nonobstructive disease and this was nonsignificant by FFR with medical management recommended. She has baseline dyspnea but no chest pain. Reports occasional episodes of jaw pain but very infrequent and no association with exertion. Encouraged her to make us  aware if episodes increase in frequency or severity. Continue Plavix  75mg  daily (on this per Vascular).  - LDL was at 45 when checked in 06/2023. Continue Crestor  20mg  daily and Zetia  10mg  daily.   2. AVNRT/Palpitations - She underwent ablation in 2014 and repeat SVT ablation in 03/2023. Was previously on Flecainide  but this was discontinued given CAD by Coronary CTA. Reports palpitations significantly improved following resumption of beta-blocker therapy. Continue Toprol -XL 12.5mg  daily.   3. PAD - Followed by Vascular Surgery. She has known carotid artery stenosis with dopplers in 11/2022 showing 50 to 69% stenosis along the LICA and no significant stenosis along the RICA. Also previously underwent stenting of her bilateral common iliac arteries in 06/2023. Followed by  Vascular Surgery. Remains on Plavix , Zetia  and Crestor .   4. 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 Reports she is anticipating a follow-up CT by Oncology in 06/2024. She does have previously scheduled follow-up with Vascular later this year.   5. HTN - BP is well-controlled at 126/68 during today's visit.  Continue current medical therapy with Hydralazine  25 mg 3 times daily, Toprol -XL 12.5 mg daily and Valsartan  320 mg daily.  6. Social Issues - Reports she is currently unable to drive due to not being able to use her breathalyzer following recent vocal cord surgery and is relying on transportation services but these are limited in [place]. Offered to reach out to our Child psychotherapist but she declined as she says she has already tried exploring options in regards to this. She plans to ask her neighbor for transportation assistance to the grocery store.  - She does report she previously talked with Dr. Waymond Hailey about undergoing a breathing test in relation to a modified breathalyzer but she has been unable  to reach him. Will send a staff message to make him aware.   Signed, Amanda Gash, PA-C

## 2024-03-01 NOTE — Patient Instructions (Signed)
 Medication Instructions:  Your physician recommends that you continue on your current medications as directed. Please refer to the Current Medication list given to you today.  *If you need a refill on your cardiac medications before your next appointment, please call your pharmacy*  Lab Work: NONE   If you have labs (blood work) drawn today and your tests are completely normal, you will receive your results only by: MyChart Message (if you have MyChart) OR A paper copy in the mail If you have any lab test that is abnormal or we need to change your treatment, we will call you to review the results.  Testing/Procedures: NONE    Follow-Up: At [facility], you and your health needs are our priority.  As part of our continuing mission to provide you with exceptional heart care, our providers are all part of one team.  This team includes your primary Cardiologist (physician) and Advanced Practice Providers or APPs (Physician Assistants and Nurse Practitioners) who all work together to provide you with the care you need, when you need it.  Your next appointment:   6 month(s)  Provider:   You may see Janelle Mediate, MD or one of the following Advanced Practice Providers on your designated Care Team:   Woodfin Hays, PA-C  [place], [place] Theotis Flake, [place]     We recommend signing up for the patient portal called "MyChart".  Sign up information is provided on this After Visit Summary.  MyChart is used to connect with patients for Virtual Visits (Telemedicine).  Patients are able to view lab/test results, encounter notes, upcoming appointments, etc.  Non-urgent messages can be sent to your provider as well.   To learn more about what you can do with MyChart, go to [email or web address].   Other Instructions Thank you for choosing [facility]!

## 2024-03-03 LAB — SURGICAL PATHOLOGY

## 2024-03-03 NOTE — Telephone Encounter (Signed)
 Per Dr. Waymond Hailey in message below, we would need to see patient to document what patient is able to do and not do.  I spoke with patient and she says she needs two PFTs done for documentation that she can not blow into hr breathalyzer. I informed her that she would need to be seen but she does not want to make an appointment. She just wants the order for the test. I informed pt that I will send Dr. Waymond Hailey a message since he will have to approve the test orders without an office visit. Pt expressed again that she did not want an office visit and has a hard time driving  Dr. Jeraldine Molt advise

## 2024-03-03 NOTE — Telephone Encounter (Signed)
-----   Message from Vernestine Gondola sent at 03/01/2024  2:07 PM EDT ----- Regarding: RE: Modified Breathalyzer Never heard of it but I'll see if if can get her in to document what she is or is not able to do  thx ----- Message ----- From: Dorma Gash, PA-C Sent: 03/01/2024   2:05 PM EDT To: Diamond Formica, MD Subject: Modified Breathalyzer                          Hi Dr. Waymond Hailey,  I saw this mutual patient back today and she expressed frustration as she has been unable to take a deep breath to use the breathalyzer on her car (prior DUI 20+ years ago). Acutely worse since recently undergoing bronchoscopy with excision of bilateral true vocal cord lesions/vocal fold stripping. She brought up that you had said she could undergo a test for a modified breathalyzer? She tried contacting your office to review but was unsuccessful, therefore I told her I would send a message to make you aware.   Thanks for your help!  Best,  [place]

## 2024-03-04 ENCOUNTER — Telehealth: Payer: Self-pay

## 2024-03-04 NOTE — Telephone Encounter (Signed)
 Per patient she is requesting PFT to be done so DMV form can be completed regarding her breathalyzer machine in her car. She claims she does not have the lung capacity to blow into the machine to get an accurate reading, and therefore cannot drive her car. She is expecting the form from the [facility] to come in the mail by the end of next week. Explained to patient she has not been seen by Dr. Waymond Hailey since 2023 and will need an OV before any testing can be ordered (this was per conversation with Dr. Waymond Hailey this morning re:the details of her request). She has been scheduled for an OV on 03/16/24 @3pm  and is aware of the date/time. She will bring the form with her to the appt. Nothing further needed at this time.

## 2024-03-04 NOTE — Telephone Encounter (Signed)
 Copied from Dr. Geraldean Klein response. It will not let me add his comment   Hat's fine to ordered pfts with dx doe but she should understand that s ov same day I won't be able to offer anything but a strict interpretation of  whether the criteria is met by the study    Here are the rules you can copy and paste in the response:   Per the DMV guidelines for the interlock system:  in order to accommodate users who have been medically determined by [facility] under G.S.  20-17.8 and pursuant to pulmonary function testing (PFT) to be physically incapable of normal  exhalation values for a person of similar age, gender, body height and size, and ethnicity/race, be  configured with the capability of permanently adjusting breath blow pressure or air volume to a  Forced Vital Capacity (FVC) at least as low as 1.2 liters;

## 2024-03-06 ENCOUNTER — Other Ambulatory Visit: Payer: Self-pay | Admitting: Family Medicine

## 2024-03-07 ENCOUNTER — Ambulatory Visit (INDEPENDENT_AMBULATORY_CARE_PROVIDER_SITE_OTHER): Admitting: Otolaryngology

## 2024-03-08 ENCOUNTER — Ambulatory Visit (INDEPENDENT_AMBULATORY_CARE_PROVIDER_SITE_OTHER): Admitting: Otolaryngology

## 2024-03-08 ENCOUNTER — Encounter (INDEPENDENT_AMBULATORY_CARE_PROVIDER_SITE_OTHER): Payer: Self-pay

## 2024-03-08 ENCOUNTER — Encounter (INDEPENDENT_AMBULATORY_CARE_PROVIDER_SITE_OTHER): Payer: Self-pay | Admitting: Otolaryngology

## 2024-03-08 VITALS — BP 129/79 | HR 77

## 2024-03-08 DIAGNOSIS — R490 Dysphonia: Secondary | ICD-10-CM | POA: Diagnosis not present

## 2024-03-08 DIAGNOSIS — J383 Other diseases of vocal cords: Secondary | ICD-10-CM

## 2024-03-08 DIAGNOSIS — J3089 Other allergic rhinitis: Secondary | ICD-10-CM | POA: Diagnosis not present

## 2024-03-08 DIAGNOSIS — C320 Malignant neoplasm of glottis: Secondary | ICD-10-CM

## 2024-03-08 DIAGNOSIS — F17210 Nicotine dependence, cigarettes, uncomplicated: Secondary | ICD-10-CM

## 2024-03-08 DIAGNOSIS — K219 Gastro-esophageal reflux disease without esophagitis: Secondary | ICD-10-CM

## 2024-03-08 DIAGNOSIS — D020 Carcinoma in situ of larynx: Secondary | ICD-10-CM

## 2024-03-08 DIAGNOSIS — R0981 Nasal congestion: Secondary | ICD-10-CM

## 2024-03-08 DIAGNOSIS — F17200 Nicotine dependence, unspecified, uncomplicated: Secondary | ICD-10-CM

## 2024-03-08 MED ORDER — AZELASTINE HCL 0.1 % NA SOLN: 2.0000 | Freq: Two times a day (BID) | NASAL | 12 refills | Status: DC

## 2024-03-08 MED ORDER — FAMOTIDINE 20 MG PO TABS: 20.0000 mg | ORAL_TABLET | Freq: Two times a day (BID) | ORAL | 3 refills | Status: DC

## 2024-03-08 NOTE — Progress Notes (Signed)
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 No current ear drainage, decreased hearing aid function, clean ear canals on exam, no middle ear effusion. 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 - Continue annual  lung cancer surveillance with scans. - if hemoptysis returns, will consider referral to [facility] for bronchoscopy  Tobacco use disorder.  We had an extensive discussion about detrimental effects of smoking on overall health. I provided resources available at [facility] to assist with smoking cessation. I spent 4 min on counseling  - Advised smoking cessation  Follow-up Scheduled for procedure on the 16th, anesthesia to review need for preoperative visit. -  Attend procedure on the 16th. - Await potential call for preoperative appointment.  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 Progression from severe dysplasia noted on excision/bx done in 2021. 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 We discussed risks and benefits of surgery and since her disease appears to be well-localized 2nd re-excision procedure will help avoid radiation therapy. Explained radiation therapy and total laryngectomy if cancer progresses, discussed that XRT can only be done once. Emphasized smoking cessation as a critical risk factor for her disease and a cause of disease progression - will schedule for DML bronch and CO2 laser excision of residual leukoplakia + margins - Discuss potential for radiation therapy if re-excision fails or if disease progresses in the future  - Educate on regular ENT follow-up every two months for repeat scope exam for surveillance in the future . - Provide DMV letter detailing diagnosis and voice limitations post-op and in the future  - Emphasize smoking cessation.   Nicotine  dependence/Tobacco use d/o Nicotine  dependence is a significant risk factor for vocal cord carcinoma progression. She has reduced smoking but needs further support to quit.Spent 4 min counseling on importance of quitting  - Provided information on  quit program and 1-800 support number.  Silent gastroesophageal reflux disease (GERD) LPR Silent GERD suspected due to scope exam changes. GERD may contribute to chronic inflammation in dysplasia or cancer. - Prescribed Pepcid  to manage silent GERD and reduce inflammation/help with healing post-op  Eustachian tube dysfunction Suspected nasal congestion and  environmental allergies  Eustachian tube dysfunction suspected due to ear pressure and hx of chronic nasal congestion. No current nasal spray or allergy medication use. - Azelastine BID - Rx sent    I spent 30 minutes in total face-to-face time and in reviewing records during pre-charting, more than 50% of which was spent in counseling and coordination of care, reviewing test results, reviewing medications and treatment regimen and/or in discussing or reviewing the diagnosis, the prognosis and treatment options. Pertinent laboratory and imaging test results that were available during this visit with the patient were reviewed by me and considered in my medical decision making (see chart for details).     Artice Last, MD Otolaryngology [facility] Phone: [phone] Fax: [phone]    03/08/2024, 12:43 PM

## 2024-03-08 NOTE — Patient Instructions (Signed)
 Dear [name],   Congratulations for your interest in quitting smoking!  Find a program that suits you best: when you want to quit, how you need support, where you live, and how you like to learn.    If you're ready to get started TODAY, consider scheduling a visit through [email or web address].  Appointments are available from 8am to 8pm, Monday to Friday.   Most health insurance plans will cover some level of tobacco cessation visits and medications.    Additional Resources: [employer] are also available to help you quit & provide the support you'll need. Many programs are available in both [place] and Spanish and have a long history of successfully helping people get off and stay off tobacco.    Quit Smoking Apps:  quitSTART at SeriousBroker.de QuitGuide?at ForgetParking.dk Online education and resources: Smokefree  at [employer].gov Free Telephone Coaching: QuitNow,  Call 1-800-QUIT-NOW (610-589-4864) or Text- Ready to (878) 314-4061 *Quitline [place] has teamed up with Medicaid to offer a free 14 week program    Vaping- Want to Quit? Free 24/7 support. Call [facility]  [place], [place], [place], [place], [place]  [facility]

## 2024-03-10 ENCOUNTER — Encounter: Payer: Self-pay | Admitting: Family Medicine

## 2024-03-10 DIAGNOSIS — H0288A Meibomian gland dysfunction right eye, upper and lower eyelids: Secondary | ICD-10-CM | POA: Diagnosis not present

## 2024-03-10 DIAGNOSIS — H43812 Vitreous degeneration, left eye: Secondary | ICD-10-CM | POA: Diagnosis not present

## 2024-03-10 DIAGNOSIS — H524 Presbyopia: Secondary | ICD-10-CM | POA: Diagnosis not present

## 2024-03-10 DIAGNOSIS — H0288B Meibomian gland dysfunction left eye, upper and lower eyelids: Secondary | ICD-10-CM | POA: Diagnosis not present

## 2024-03-10 DIAGNOSIS — H52209 Unspecified astigmatism, unspecified eye: Secondary | ICD-10-CM | POA: Diagnosis not present

## 2024-03-10 DIAGNOSIS — Z9841 Cataract extraction status, right eye: Secondary | ICD-10-CM | POA: Diagnosis not present

## 2024-03-10 DIAGNOSIS — H5210 Myopia, unspecified eye: Secondary | ICD-10-CM | POA: Diagnosis not present

## 2024-03-10 DIAGNOSIS — H1013 Acute atopic conjunctivitis, bilateral: Secondary | ICD-10-CM | POA: Diagnosis not present

## 2024-03-10 DIAGNOSIS — H04123 Dry eye syndrome of bilateral lacrimal glands: Secondary | ICD-10-CM | POA: Diagnosis not present

## 2024-03-11 DIAGNOSIS — M19071 Primary osteoarthritis, right ankle and foot: Secondary | ICD-10-CM | POA: Diagnosis not present

## 2024-03-11 DIAGNOSIS — Z1211 Encounter for screening for malignant neoplasm of colon: Secondary | ICD-10-CM | POA: Diagnosis not present

## 2024-03-11 DIAGNOSIS — R030 Elevated blood-pressure reading, without diagnosis of hypertension: Secondary | ICD-10-CM | POA: Diagnosis not present

## 2024-03-11 DIAGNOSIS — R29818 Other symptoms and signs involving the nervous system: Secondary | ICD-10-CM | POA: Diagnosis not present

## 2024-03-11 DIAGNOSIS — Z1339 Encounter for screening examination for other mental health and behavioral disorders: Secondary | ICD-10-CM | POA: Diagnosis not present

## 2024-03-11 DIAGNOSIS — Z122 Encounter for screening for malignant neoplasm of respiratory organs: Secondary | ICD-10-CM | POA: Diagnosis not present

## 2024-03-11 DIAGNOSIS — F17210 Nicotine dependence, cigarettes, uncomplicated: Secondary | ICD-10-CM | POA: Diagnosis not present

## 2024-03-11 DIAGNOSIS — Z0000 Encounter for general adult medical examination without abnormal findings: Secondary | ICD-10-CM | POA: Diagnosis not present

## 2024-03-11 DIAGNOSIS — Z1331 Encounter for screening for depression: Secondary | ICD-10-CM | POA: Diagnosis not present

## 2024-03-11 MED ORDER — BUSPIRONE HCL 5 MG PO TABS: 5.0000 mg | ORAL_TABLET | Freq: Two times a day (BID) | ORAL | 1 refills | Status: DC

## 2024-03-14 NOTE — Progress Notes (Unsigned)
 [name], female    DOB: 1951/09/15,    MRN: [id number]   Brief patient profile:  42 yowf active smoker healthy until  2016 incidental  RLL nodule   DIAGNOSIS:  Stage IA (T1a, N0, M0) non-small cell lung cancer, moderately differentiated squamous cell carcinoma diagnosed in July 2016   PRIOR THERAPY: Right VATS with right lower lobectomy with lymph node dissection under the care of Dr. Nicanor Barge on 07/25/2015     History of Present Illness  06/19/2020  Pulmonary/ 1st office eval/ Clarie Camey / [facility]  Chief Complaint  Patient presents with   Consult    Shortness of breath with exertion  Dyspnea:  50 -100 ft (not verified on 600 ft walk on day of ov)  Cough: immediately lying down and in am clear x rattling  Sleep: on side one pillow  SABA use: none / "spiriva  hurt my lungs" 02  2lpm hs only  rec The key is to stop smoking completely before smoking completely stops you - good luck! I will document your problems with the safety lock to support your claim you have trouble using it. No need for medications for now   08/15/2020  f/u ov/Emori Mumme re: COPD GOLD 1 still smoking / no maint rx  Chief Complaint  Patient presents with   Follow-up    shortness of breath with exertion  Dyspnea:  Worse since last ov / plans to see Darlin Ehrlich soon as also more hoarse Cough:  qhs severe and dry nose and throat  X months Sleeping: on R side bed is flat   SABA use: none  02: 2lpm hs   Rec No pulmonary medications are needed  Protonix  40 mg Take 30- 60 min before your first and last meals of the day  GERD diet reviewed, bed blocks      11/07/2022  f/u ov/[place] office/Avielle Imbert re: GOLD 1  maint on no regular inhalers   Teoh "scar tissue"  Chief Complaint  Patient presents with   Follow-up    Congestion in chest she can't get up.   Dyspnea:  attibutes to awkward gait  Cough: clear mucus/ real thick in am  Sleeping: cannot tol cpap, just does 2 lpm humidified and says rests fine   SABA use: none  02: 2lpm hs  Main concern is dry mouth (taking zyrtec  and bentyl  prn)  Rec For cough/ congestion > mucinex 1200 mg every 12 hours and use the flutter valve as much as possible  Bentyl  and cigarettes are the biggest factors for your cough The key is to stop smoking completely before smoking completely stops you!  Dx throat ca by Soldativa  02/24/24    03/16/2024  re establish  ov/[place] office/Ladavia Lindenbaum re: GOLD 1 COPD doe maint on no meds x years / stiolto caused lung burning  / still smoking  Chief Complaint  Patient presents with   COPD   Dyspnea:  [employer] shopping gives out  Cough: none  Sleeping: bed is flat / on side one pillow   resp cc  SABA use: none  02: 02 2lpm q night - not using daytime    No obvious day to day or daytime variability or assoc excess/ purulent sputum or mucus plugs or hemoptysis or cp or chest tightness, subjective wheeze or overt sinus or hb symptoms.    Also denies any obvious fluctuation of symptoms with weather or environmental changes or other aggravating or alleviating factors except as outlined above   No unusual  exposure hx or h/o childhood pna/ asthma or knowledge of premature birth.  Current Allergies, Complete Past Medical History, Past Surgical History, Family History, and Social History were reviewed in [employer] record.  ROS  The following are not active complaints unless bolded Hoarseness, sore throat, dysphagia, dental problems, itching, sneezing,  nasal congestion or discharge of excess mucus or purulent secretions, ear ache,   fever, chills, sweats, unintended wt loss or wt gain, classically pleuritic or exertional cp,  orthopnea pnd or arm/hand swelling  or leg swelling, presyncope, palpitations, abdominal pain, anorexia, nausea, vomiting, diarrhea  or change in bowel habits or change in bladder habits, change in stools or change in urine, dysuria, hematuria,  rash, arthralgias, visual complaints,  headache, numbness, weakness or ataxia or problems with walking or coordination,  change in mood or  memory.         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 states that she can't breathe after surgery when laying on back   COPD (chronic obstructive pulmonary disease) (HCC)    Coronary artery disease    Daily headache    Patient stated they are felt in back of the head, not throbing. But always in same spot. MRI's done, no reason why they occur. (10/20/2013)   Depression    Diastolic dysfunction 01/28/2012   Grade 1   Diverticulitis    pt reports 8 times. Dr. Saint Cranker colectomy in 2009   Diverticulosis 2008   diagnosed; pt. states now cured 07/19/15   Dysrhythmia    pt. states not since ablation..history of Supraventricular tachycardia   Fibromyalgia    Fracture 2006   left foot & ankle , immobilized for healing    Gout    Recently diagnosed.   HCV antibody positive    HEARING LOSS    since age 1   Hepatitis C 1993    Needs Hepatic panel every 6   months, treated for 1 year    Hiatal hernia    "repaired"    History of blood transfusion    "probably when I was young, when I was 30" (10/20/2013)   History of pneumonia    Hyperlipidemia 2001   Hypertension 2001   Menopause    per medical history form   Night sweats    Per medical history form dated 05/02/11.   On home oxygen  therapy    "2L only at night" (10/20/2013); pt. currently not wearing O2 at night (07/19/15)   Panic disorder    was followed by mental health   Sleep apnea 2001   non compliant wit the use of the machine   Sleep apnea     wear oxygen  at bedtime.    SOB (shortness of breath)    "after lying in bed, go to the bathroom; heart races & I'm SOB" (10/20/2013)   SVT (supraventricular tachycardia) (HCC)    s/p ablation 10-20-2013 by Dr Carolynne Citron   Tinnitus 2006   disabling   Wears dentures    Per medical history form dated 05/02/11.  Wears glasses        Objective:      wts  03/16/2024         122  11/07/2022     132   08/15/20 125 lb 6.4 oz (56.9 kg)  08/13/20 127 lb 0.6 oz (57.6 kg)  06/19/20 123 lb 6.4 oz (56 kg)    Vital signs reviewed  03/16/2024  - Note at rest 02 sats  93% on RA   General appearance:    amb restless wf pacing the room/slt hoarse    HEENT : Oropharynx  clear  Nasal turbinates nl    NECK :  without  apparent JVD/ palpable Nodes/TM / upper airway wheeze/ rattling    LUNGS: no acc muscle use,  Min barrel  contour chest wall with bilateral  slightly decreased bs s audible wheeze and  without cough on insp or exp maneuvers and min  Hyperresonant  to  percussion bilaterally    CV:  RRR  no s3 or murmur or increase in P2, and no edema   ABD:  soft and nontender   MS:  Nl gait/ ext warm without deformities Or obvious joint restrictions  calf tenderness, cyanosis or clubbing    SKIN: warm and dry without lesions    NEURO:  alert, approp, nl sensorium with  no motor or cerebellar deficits apparent.          I personally reviewed images and agree with radiology impression as follows:   Chest CTw contrast   06/11/23 table postsurgical findings of right lower lobectomy. Moderate centrilobular emphysema. Stable small irregular solid nodule of the left lower lobe measuring 4 mm on series 4, image 51 no new or enlarging pulmonary nodules. No pleural effusion       Assessment   Outpatient Encounter Medications as of 03/16/2024  Medication Sig   pantoprazole  (PROTONIX ) 40 MG tablet Take 1 tablet (40 mg total) by mouth daily. Take 30-60 min before first meal of the day   azelastine   (ASTELIN ) 0.1 % nasal spray Place 2 sprays into both nostrils 2 (two) times daily. Use in each nostril as directed   busPIRone  (BUSPAR ) 5 MG tablet Take 1 tablet (5 mg total) by mouth 2 (two) times daily.   cetirizine  (ZYRTEC ) 10 MG tablet Take 1 tablet (10 mg total) daily by mouth. (Patient taking differently: Take 10 mg by mouth daily as needed for allergies.)   clopidogrel  (PLAVIX ) 75 MG tablet Take 37.5 mg by mouth in the morning.   doxycycline  (PERIOSTAT ) 20 MG tablet Take 20 mg by mouth 3 (three) times daily as needed (spasms).   EPINEPHrine  0.3 mg/0.3 mL IJ SOAJ injection Inject 0.3 mg into the muscle as needed for anaphylaxis.   ezetimibe  (ZETIA ) 10 MG tablet TAKE ONE TABLET BY MOUTH EVERY DAY   famotidine  (PEPCID ) 20 MG tablet Take 1 tablet (20 mg total) by mouth 2 (two) times daily.   hydrALAZINE  (APRESOLINE ) 25 MG tablet Take 25 mg by mouth 3 (three) times daily.   HYDROcodone -acetaminophen  (NORCO) 10-325 MG tablet Take 1 tablet by mouth 4 (four) times daily.   metoprolol  succinate (TOPROL  XL) 25 MG 24 hr tablet Take 1 tablet (25 mg total) by mouth daily. (Patient taking differently: Take 12.5 mg by mouth daily.)   OXYGEN  Inhale 2 L into the lungs at bedtime.   rosuvastatin  (CRESTOR ) 20 MG tablet TAKE ONE TABLET BY MOUTH ONCE DAILY   valsartan  (DIOVAN ) 320 MG tablet TAKE ONE TABLET BY MOUTH EVERY  DAY   No facility-administered encounter medications on file as of 03/16/2024.

## 2024-03-16 ENCOUNTER — Ambulatory Visit: Admitting: Internal Medicine

## 2024-03-16 ENCOUNTER — Encounter: Payer: Self-pay | Admitting: Internal Medicine

## 2024-03-16 ENCOUNTER — Telehealth: Payer: Self-pay

## 2024-03-16 VITALS — BP 133/73 | HR 79 | Wt 122.6 lb

## 2024-03-16 DIAGNOSIS — F17210 Nicotine dependence, cigarettes, uncomplicated: Secondary | ICD-10-CM | POA: Diagnosis not present

## 2024-03-16 DIAGNOSIS — J449 Chronic obstructive pulmonary disease, unspecified: Secondary | ICD-10-CM

## 2024-03-16 DIAGNOSIS — C801 Malignant (primary) neoplasm, unspecified: Secondary | ICD-10-CM

## 2024-03-16 MED ORDER — PANTOPRAZOLE SODIUM 40 MG PO TBEC: 40.0000 mg | DELAYED_RELEASE_TABLET | Freq: Every day | ORAL | 2 refills | Status: DC

## 2024-03-16 NOTE — Patient Instructions (Addendum)
 My office will be contacting you by phone for referral to PFTs 1st available ASAP   - if you don't hear back from my office within one week please call us  back or notify us  thru MyChart and we'll address it right away.   Pantoprazole  (protonix ) 40 mg   Take  30-60 min before first meal of the day and Pepcid  (famotidine )  20 mg after supper until return to office - this is the best way to tell whether stomach acid is contributing to your problem.    GERD (REFLUX)  is an extremely common cause of respiratory symptoms just like yours , many times with no obvious heartburn at all.    It can be treated with medication, but also with lifestyle changes including elevation of the head of your bed (ideally with 6 -8inch blocks under the headboard of your bed),  Smoking cessation, avoidance of late meals, excessive alcohol , and avoid fatty foods, chocolate, peppermint, colas, red wine, and acidic juices such as orange juice.  NO MINT OR MENTHOL PRODUCTS SO NO COUGH DROPS  USE SUGARLESS CANDY INSTEAD (Jolley ranchers or Stover's or Life Savers) or even ice chips will also do - the key is to swallow to prevent all throat clearing. NO OIL BASED VITAMINS - use powdered substitutes.  Avoid fish oil when coughing.  For cough/ congestion > mucinex or mucinex dm  up to maximum of  1200 mg every 12 hours as needed     Pulmonary follow up is as needed

## 2024-03-16 NOTE — Telephone Encounter (Signed)
 Pt stopped by the office to let you know the Buspirone  5mg  is not helping. States she is taking 1 in the am and 1 in the pm. She expresses that her anxiety continues to get worse and she is having a hard time with her cancer diagnosis. Pt expresses concern about becoming suicidal. Pt would like advise on how to move forward. Pt is scheduled to see you at the end of the month but pt is asking to speak to you sooner or know what you advise in the mean time?

## 2024-03-16 NOTE — Assessment & Plan Note (Signed)
 Counseled re importance of smoking cessation but did not meet time criteria for separate billing            Each maintenance medication was reviewed in detail including emphasizing most importantly the difference between maintenance and prns and under what circumstances the prns are to be triggered using an action plan format where appropriate.  Total time for H and P, chart review, counseling,  and generating customized AVS unique to this office visit / same day charting = 41 min for pt not seen in over a year for preop pulmonary eval.

## 2024-03-17 ENCOUNTER — Other Ambulatory Visit: Payer: Self-pay | Admitting: Cardiovascular Disease

## 2024-03-17 ENCOUNTER — Other Ambulatory Visit: Payer: Self-pay | Admitting: Family Medicine

## 2024-03-17 MED ORDER — BUSPIRONE HCL 5 MG PO TABS: 5.0000 mg | ORAL_TABLET | Freq: Three times a day (TID) | ORAL | 0 refills | Status: DC

## 2024-03-17 NOTE — Telephone Encounter (Signed)
 Pt advised and verbalized understanding. Pt agreeable to being referred to counseling. States today has been better. I encouraged her to reach out if things worsen for her or if she does become suicidal. Pt states she has the crisis help line on her fridge.  Pt scheduled to see you 05/23.

## 2024-03-17 NOTE — Telephone Encounter (Signed)
Patient and/or legal guardian verbally consented to [facility] services about presenting concerns and psychiatric consultation as appropriate.  The services will be billed as appropriate for the patient.

## 2024-03-18 ENCOUNTER — Encounter (INDEPENDENT_AMBULATORY_CARE_PROVIDER_SITE_OTHER): Payer: Self-pay | Admitting: Otolaryngology

## 2024-03-18 MED ORDER — NICOTINE 21 MG/24HR TD PT24
21.0000 mg | MEDICATED_PATCH | Freq: Every day | TRANSDERMAL | 0 refills | Status: DC
Start: 2024-03-18 — End: 2024-07-13

## 2024-03-21 ENCOUNTER — Telehealth: Payer: Self-pay | Admitting: Internal Medicine

## 2024-03-21 ENCOUNTER — Telehealth (INDEPENDENT_AMBULATORY_CARE_PROVIDER_SITE_OTHER): Payer: Self-pay | Admitting: Otolaryngology

## 2024-03-21 ENCOUNTER — Other Ambulatory Visit: Payer: Self-pay

## 2024-03-21 DIAGNOSIS — J449 Chronic obstructive pulmonary disease, unspecified: Secondary | ICD-10-CM

## 2024-03-21 NOTE — Telephone Encounter (Signed)
 Patient dropped off [place] DMV Ignition Interlock Accommodation Form to be completed by Dr. Soldatova.  Patient requested completed form be mailed back to her in the self addressed stamped envelope provided.  Form was completed, put in envelope and put in outgoing mail tray.  Called and spoke with patient to let her know the form was completed and it will be going out in the mail today in the envelope she provided.

## 2024-03-21 NOTE — Telephone Encounter (Signed)
 Spoke with pateint regarding the 1:00 pm PFT appointment Wednesday 03/23/24 at [facility]--arrival time is 12:45 pm 1st floor registration desk at Entrance A---patient was given instructions: NO SMOKING OR TOBACCO PRODUCTS-----------6 HOURS PRIOR  NO CAFFEINE OR DEFCAFFEINATED PRODUCTS-----------6 HOURS PRIOR NO INHALER(S) / BREATHING MEDICATION(S) or BRONCHODILATORS----6 HOURS PRIOR.  She voiced her understanding

## 2024-03-21 NOTE — Telephone Encounter (Signed)
 Patient stopped by in person to drop off papers to be signed for an upcoming surgery.  She stated she would like to speak with someone regarding some questions she has about her medications.  Also, she would like to discuss some changes with her upcoming surgery.  Please call her at [phone].

## 2024-03-23 ENCOUNTER — Other Ambulatory Visit: Payer: Self-pay | Admitting: Cardiovascular Disease

## 2024-03-23 ENCOUNTER — Ambulatory Visit (HOSPITAL_COMMUNITY)
Admission: RE | Admit: 2024-03-23 | Discharge: 2024-03-23 | Disposition: A | Discharge status: Home / Self Care | Source: Ambulatory Visit | Attending: Internal Medicine | Admitting: Internal Medicine

## 2024-03-23 DIAGNOSIS — J449 Chronic obstructive pulmonary disease, unspecified: Secondary | ICD-10-CM | POA: Diagnosis not present

## 2024-03-23 LAB — PULMONARY FUNCTION TEST
DL/VA % pred: 50 %
DL/VA: 2.1 ml/min/mmHg/L
DLCO unc % pred: 45 %
DLCO unc: 8.47 ml/min/mmHg
FEF 25-75 Post: 1.09 L/s
FEF 25-75 Pre: 1.05 L/s
FEF2575-%Change-Post: 3 %
FEF2575-%Pred-Post: 65 %
FEF2575-%Pred-Pre: 63 %
FEV1-%Change-Post: 5 %
FEV1-%Pred-Post: 94 %
FEV1-%Pred-Pre: 90 %
FEV1-Post: 1.94 L
FEV1-Pre: 1.85 L
FEV1FVC-%Change-Post: 4 %
FEV1FVC-%Pred-Pre: 83 %
FEV6-%Change-Post: 3 %
FEV6-%Pred-Post: 113 %
FEV6-%Pred-Pre: 109 %
FEV6-Post: 2.93 L
FEV6-Pre: 2.84 L
FEV6FVC-%Change-Post: -1 %
FEV6FVC-%Pred-Post: 103 %
FEV6FVC-%Pred-Pre: 105 %
FVC-%Change-Post: 0 %
FVC-%Pred-Post: 108 %
FVC-%Pred-Pre: 107 %
FVC-Post: 2.97 L
Post FEV1/FVC ratio: 65 %
Post FEV6/FVC ratio: 99 %
Pre FEV1/FVC ratio: 63 %
Pre FEV6/FVC Ratio: 100 %
RV % pred: 272 %
RV: 6.04 L
TLC % pred: 181 %
TLC: 8.89 L

## 2024-03-23 MED ORDER — ALBUTEROL SULFATE (2.5 MG/3ML) 0.083% IN NEBU
2.5000 mg | INHALATION_SOLUTION | Freq: Once | RESPIRATORY_TRACT | Status: AC
  Administered 2024-03-23: 2.5 mg via RESPIRATORY_TRACT

## 2024-03-27 ENCOUNTER — Ambulatory Visit: Payer: Self-pay | Admitting: Internal Medicine

## 2024-03-27 NOTE — Assessment & Plan Note (Signed)
 Active smoker - s/p RLLobectomy 2016 - Spirometry 09/08/17   FEV1 1.98 (98%)  Ratio 0.69  With minimal curvature  -  06/19/2020   Walked RA  approx   600 ft  @ fast pace  stopped due to  End of study, slt dizzy at end s sob and sats 96%    - PFT's  07/20/20  FEV1 2.07 (95 % ) ratio 0.65  p 0 % improvement from saba p 0 prior to study with DLCO  4.22 (22%) corrects to 2.07 (45%)  for alv volume and FV curve mild concavity  Air trapping present   -  08/15/2020   Walked RA  approx   600  ft  @ fast pace  stopped due to  End of study, no sob, sats 98%    - 03/16/2024   Walked on RA  x  3  lap(s) =  approx 450  ft  @ mod pace, stopped due to end of study with lowest 02 sats 94% and no sob      She does not have significant copd but requesting repeat spirometry for pre-op purposes and to turn over to [facility] to get an exclusion for the igniton lock out  Per the DMV guidelines for the interlock system: in order to accommodate users who have been medically determined by [employer]CDMV under G.S. 20-17.8 and pursuant to pulmonary function testing (PFT) to be physically incapable of normal exhalation values for a person of similar age, gender, body height and size, and ethnicity/race, be configured with the capability of permanently adjusting breath blow pressure or air volume to a Forced Vital Capacity (FVC) at least as low as 1.2 liters;   She won't likely meet the PFT criteria but with her throat ca she clearly won't be able to continue to use this device in the short term and possibly the long term depending on the extent of resection needed which I cede to Dr Vassie Gentry judgement and clarification for the ignition lockout device.    might benefit periop with max gerd rx which I reviewed with her in detail - see avs for instructions unique to this ov   F/u in this clinic is prn

## 2024-03-28 ENCOUNTER — Telehealth: Payer: Self-pay

## 2024-03-28 NOTE — Telephone Encounter (Signed)
 Form completed and given to front desk.   Patient came by office and picked up form. Nothing further needed at this time.

## 2024-03-28 NOTE — Telephone Encounter (Signed)
 Patient came to office and brought DMV form for completion.   Form given to Dr. Waymond Hailey.

## 2024-03-31 ENCOUNTER — Ambulatory Visit: Payer: Medicare HMO | Admitting: Cardiovascular Disease

## 2024-04-01 ENCOUNTER — Encounter: Payer: Self-pay | Admitting: Family Medicine

## 2024-04-01 ENCOUNTER — Ambulatory Visit (INDEPENDENT_AMBULATORY_CARE_PROVIDER_SITE_OTHER): Payer: 59 | Admitting: Family Medicine

## 2024-04-01 VITALS — BP 111/67 | HR 82 | Temp 98.1°F | Ht 63.0 in | Wt 117.0 lb

## 2024-04-01 DIAGNOSIS — I10 Essential (primary) hypertension: Secondary | ICD-10-CM | POA: Diagnosis not present

## 2024-04-01 DIAGNOSIS — I480 Paroxysmal atrial fibrillation: Secondary | ICD-10-CM

## 2024-04-01 DIAGNOSIS — J3089 Other allergic rhinitis: Secondary | ICD-10-CM | POA: Diagnosis not present

## 2024-04-01 DIAGNOSIS — C320 Malignant neoplasm of glottis: Secondary | ICD-10-CM | POA: Diagnosis not present

## 2024-04-01 DIAGNOSIS — K219 Gastro-esophageal reflux disease without esophagitis: Secondary | ICD-10-CM

## 2024-04-01 DIAGNOSIS — J302 Other seasonal allergic rhinitis: Secondary | ICD-10-CM

## 2024-04-01 DIAGNOSIS — E782 Mixed hyperlipidemia: Secondary | ICD-10-CM | POA: Diagnosis not present

## 2024-04-01 DIAGNOSIS — F17200 Nicotine dependence, unspecified, uncomplicated: Secondary | ICD-10-CM

## 2024-04-01 DIAGNOSIS — G4734 Idiopathic sleep related nonobstructive alveolar hypoventilation: Secondary | ICD-10-CM

## 2024-04-01 DIAGNOSIS — M549 Dorsalgia, unspecified: Secondary | ICD-10-CM | POA: Diagnosis not present

## 2024-04-01 NOTE — Patient Instructions (Addendum)
 F/U in 3 months, call if you need me sooner  I will send your Oncologist a message  as discussed  QUIT date set and is happening , great!   Important that you proceed with recommended treatment  by ENT

## 2024-04-03 DIAGNOSIS — C320 Malignant neoplasm of glottis: Secondary | ICD-10-CM | POA: Insufficient documentation

## 2024-04-03 MED ORDER — BUSPIRONE HCL 5 MG PO TABS: 5.0000 mg | ORAL_TABLET | Freq: Three times a day (TID) | ORAL | 3 refills | Status: DC

## 2024-04-03 NOTE — Assessment & Plan Note (Signed)
 Hyperlipidemia:Low fat diet discussed and encouraged.   Lipid Panel  Lab Results  Component Value Date   CHOL 130 07/01/2023   HDL 63 07/01/2023   LDLCALC 45 07/01/2023   TRIG 128 07/01/2023   CHOLHDL 2.1 07/01/2023     Controlled when last checked  Updated lab needed at/ before next visit.

## 2024-04-03 NOTE — Assessment & Plan Note (Signed)
 Surgery scheduled for 05/2024, pt deferred sooner appt

## 2024-04-03 NOTE — Assessment & Plan Note (Signed)
 Chronic pain management through pain clinic, controlled on current meds

## 2024-04-03 NOTE — Assessment & Plan Note (Signed)
 Controlled, no change in medication

## 2024-04-03 NOTE — Assessment & Plan Note (Addendum)
 Maintained on chronic anticoagulant, rate is controlled

## 2024-04-03 NOTE — Assessment & Plan Note (Signed)
 Needs supplemental oxygen  at bedtime

## 2024-04-03 NOTE — Progress Notes (Signed)
 [name]     MRN: [id number]      DOB: [birth year]  No chief complaint on file.   HPI [name] is here for follow up and re-evaluation of chronic medical conditions, medication management and review of any available recent lab and radiology data.  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 She has improved in terms of her anxiety , is currently on meds which she states have helped and she is delving into spirituality as well as holistic medication in the interim, has been cleansing her system with sugfar free diet  also I have offered to aler her Oncologist as to what is going on with her but advise that she needs to proceed with ENT management sooner rather than later and nmot postpone again, she is in agreement Has a quit date for smoking  The PT denies any adverse reactions to current medications since the last visit.  ROS Denies recent fever or chills. Denies sinus pressure, nasal congestion, ear pain or sore throat.chronic hoarseness present Chronic smoker's cough Denies chest pains, palpitations and leg swelling Denies abdominal pain, nausea, vomiting,diarrhea or constipation.   Denies dysuria, frequency, hesitancy or incontinence. Chronic  joint pain, swelling and limitation in mobility. Denies headaches, seizures, numbness, or tingling. Increased depression and  anxiety not suicidal or homicidal, no interest in therapy currently or insomnia. Denies skin break down or rash.   PE  BP 111/67   Pulse 82   Temp 98.1 F (36.7 C) (Oral)   Ht 5\' 3"  (1.6 m)   Wt 117 lb 0.6 oz (53.1 kg)   SpO2 96%   BMI 20.73 kg/m   Patient alert and oriented and in no cardiopulmonary distress.  HEENT: No facial asymmetry, EOMI,     Neck supple .  Chest: Clear to  auscultation bilaterally.  CVS: S1, S2 no murmurs, no S3.Regular rate.  .   Ext: No edema  MS: Adequate though reduced ROM spine, shoulders, hips and knees.  Skin: Intact, no ulcerations or rash noted.  Psych: Good eye contact, normal affect. Memory intact mildly  anxious not  depressed appearing.  CNS: CN 2-12 intact, power,  normal throughout.no focal deficits noted.   Assessment & Plan  Essential hypertension Controlled, no change in medication   Hyperlipemia Hyperlipidemia:Low fat diet discussed and encouraged.   Lipid Panel  Lab Results  Component Value Date   CHOL 130 07/01/2023   HDL 63 07/01/2023   LDLCALC 45 07/01/2023   TRIG 128 07/01/2023   CHOLHDL 2.1 07/01/2023     Controlled when last checked  Updated lab needed at/ before next visit.   Back pain with radiation Chronic pain management through pain clinic, controlled on current meds  Vocal cord cancer [facility]) Surgery scheduled for 05/2024, pt deferred sooner appt  GERD Controlled, no change in medication   Nocturnal hypoxia Needs supplemental oxygen  at bedtime  Paroxysmal A-fib (HCC) Maintained on chronic anticoagulant, rate is controlled  Seasonal and perennial allergic rhinitis Controlled, no change in medication   NICOTINE  ADDICTION Asked:confirms currently smokes cigarettes Assess: Has  set a quit date, and  is cutting back Advise: needs to QUIT to reduce risk of cancer, cardio and cerebrovascular disease Assist: counseled for 5 minutes and literature provided Arrange: follow  up in 2 to 4 months

## 2024-04-04 NOTE — Assessment & Plan Note (Signed)
 Controlled, no change in medication

## 2024-04-04 NOTE — Assessment & Plan Note (Signed)
 Asked:confirms currently smokes cigarettes Assess: Has  set a quit date, and  is cutting back Advise: needs to QUIT to reduce risk of cancer, cardio and cerebrovascular disease Assist: counseled for 5 minutes and literature provided Arrange: follow up in 2 to 4 months

## 2024-04-06 ENCOUNTER — Encounter (INDEPENDENT_AMBULATORY_CARE_PROVIDER_SITE_OTHER): Admitting: Otolaryngology

## 2024-04-07 ENCOUNTER — Encounter: Payer: Self-pay | Admitting: Internal Medicine

## 2024-04-07 DIAGNOSIS — J9611 Chronic respiratory failure with hypoxia: Secondary | ICD-10-CM

## 2024-04-07 DIAGNOSIS — J449 Chronic obstructive pulmonary disease, unspecified: Secondary | ICD-10-CM

## 2024-04-07 NOTE — Telephone Encounter (Signed)
 Fine with me to order a second pft but needs just spirometery without before and after or dlco or lung volumes

## 2024-04-07 NOTE — Telephone Encounter (Signed)
 Please advise patient request on 2nd PFT, thanks

## 2024-04-11 DIAGNOSIS — C3490 Malignant neoplasm of unspecified part of unspecified bronchus or lung: Secondary | ICD-10-CM | POA: Diagnosis not present

## 2024-04-11 DIAGNOSIS — R030 Elevated blood-pressure reading, without diagnosis of hypertension: Secondary | ICD-10-CM | POA: Diagnosis not present

## 2024-04-11 DIAGNOSIS — M19071 Primary osteoarthritis, right ankle and foot: Secondary | ICD-10-CM | POA: Diagnosis not present

## 2024-04-14 ENCOUNTER — Ambulatory Visit (HOSPITAL_BASED_OUTPATIENT_CLINIC_OR_DEPARTMENT_OTHER): Admitting: Internal Medicine

## 2024-04-14 DIAGNOSIS — J449 Chronic obstructive pulmonary disease, unspecified: Secondary | ICD-10-CM | POA: Diagnosis not present

## 2024-04-14 DIAGNOSIS — J9611 Chronic respiratory failure with hypoxia: Secondary | ICD-10-CM

## 2024-04-14 LAB — PULMONARY FUNCTION TEST
DL/VA % pred: 64 %
DL/VA: 2.68 ml/min/mmHg/L
DLCO cor % pred: 40 %
DLCO cor: 7.51 ml/min/mmHg
DLCO unc % pred: 40 %
DLCO unc: 7.51 ml/min/mmHg
FEF 25-75 Post: 1.24 L/s
FEF 25-75 Pre: 1.08 L/s
FEF2575-%Change-Post: 15 %
FEF2575-%Pred-Post: 75 %
FEF2575-%Pred-Pre: 65 %
FEV1-%Change-Post: 3 %
FEV1-%Pred-Post: 91 %
FEV1-%Pred-Pre: 88 %
FEV1-Post: 1.87 L
FEV1-Pre: 1.81 L
FEV1FVC-%Change-Post: -3 %
FEV1FVC-%Pred-Pre: 89 %
FEV6-%Change-Post: 6 %
FEV6-%Pred-Post: 110 %
FEV6-%Pred-Pre: 103 %
FEV6-Post: 2.87 L
FEV6-Pre: 2.68 L
FEV6FVC-%Change-Post: 0 %
FEV6FVC-%Pred-Post: 104 %
FEV6FVC-%Pred-Pre: 105 %
FVC-%Change-Post: 7 %
FVC-%Pred-Post: 105 %
FVC-%Pred-Pre: 98 %
FVC-Post: 2.88 L
FVC-Pre: 2.68 L
Post FEV1/FVC ratio: 65 %
Post FEV6/FVC ratio: 100 %
Pre FEV1/FVC ratio: 67 %
Pre FEV6/FVC Ratio: 100 %

## 2024-04-14 NOTE — Progress Notes (Signed)
 Full PFT performed today.

## 2024-04-14 NOTE — Patient Instructions (Signed)
 Full PFT performed today.

## 2024-04-17 ENCOUNTER — Ambulatory Visit: Payer: Self-pay | Admitting: Internal Medicine

## 2024-04-18 NOTE — Progress Notes (Signed)
 Attempted to contact patient regarding results.No voicemail set up.

## 2024-04-19 NOTE — Progress Notes (Signed)
Patient has reviewed results and recommendations via My Chart.

## 2024-05-04 ENCOUNTER — Encounter (INDEPENDENT_AMBULATORY_CARE_PROVIDER_SITE_OTHER): Admitting: Otolaryngology

## 2024-05-10 ENCOUNTER — Telehealth: Payer: Self-pay

## 2024-05-10 NOTE — Telephone Encounter (Signed)
 Patient left a voicemail stating that she is scheduled for a CT scan at [facility] on 06/09/24 and needs labs completed beforehand.  Sent a message to [facility], and an appointment has been made for labs at 3:00 PM on 06/09/24.  Spoke with patient and confirmed both appointments. Lab orders are in place for the expected date of 06/09/24. Patient voiced understanding.

## 2024-05-11 DIAGNOSIS — F17210 Nicotine dependence, cigarettes, uncomplicated: Secondary | ICD-10-CM | POA: Diagnosis not present

## 2024-05-11 DIAGNOSIS — I482 Chronic atrial fibrillation, unspecified: Secondary | ICD-10-CM | POA: Diagnosis not present

## 2024-05-11 DIAGNOSIS — J438 Other emphysema: Secondary | ICD-10-CM | POA: Diagnosis not present

## 2024-05-11 DIAGNOSIS — M19071 Primary osteoarthritis, right ankle and foot: Secondary | ICD-10-CM | POA: Diagnosis not present

## 2024-05-11 DIAGNOSIS — Z9181 History of falling: Secondary | ICD-10-CM | POA: Diagnosis not present

## 2024-05-11 DIAGNOSIS — R030 Elevated blood-pressure reading, without diagnosis of hypertension: Secondary | ICD-10-CM | POA: Diagnosis not present

## 2024-05-11 DIAGNOSIS — C3490 Malignant neoplasm of unspecified part of unspecified bronchus or lung: Secondary | ICD-10-CM | POA: Diagnosis not present

## 2024-05-16 ENCOUNTER — Telehealth: Payer: Self-pay | Admitting: Medical Oncology

## 2024-05-16 ENCOUNTER — Encounter: Payer: Self-pay | Admitting: Family Medicine

## 2024-05-16 NOTE — Telephone Encounter (Signed)
 Dysphagia-Patient called to report experiencing difficulty swallowing food, describing it as food getting hung up. Symptoms began approximately 4 days ago. Patient states she needs to drink a large amount of water to help pass the food. For example, she ate a hard-boiled egg yesterday and had trouble swallowing it but was able to clear it by drinking plenty of water.   ENT f/u 07/14-Dot wants Dr .Sherrod to advise before she proceeds with any ENT procedure. I advised her to eat jello, soup , puddings, drink fluids.

## 2024-05-16 NOTE — Telephone Encounter (Signed)
 HX lung cancer.   New dx squamous cell vocal cord. Wants Mohamed to consider changing CT chest to PET scan.  She does not want ENT to do anything else until [name] advises.  ...A. VOCAL CORD, RIGHT, BIOPSY:  - Squamous cell carcinoma in situ, suspicious for focal invasion.  See  comment   B. VOCAL CORD, LEFT, BIOPSY:  - Squamous cell carcinoma in situ

## 2024-05-17 ENCOUNTER — Telehealth: Payer: Self-pay | Admitting: Medical Oncology

## 2024-05-17 ENCOUNTER — Encounter (INDEPENDENT_AMBULATORY_CARE_PROVIDER_SITE_OTHER): Payer: Self-pay | Admitting: Otolaryngology

## 2024-05-17 ENCOUNTER — Encounter: Payer: Self-pay | Admitting: Medical Oncology

## 2024-05-17 NOTE — Telephone Encounter (Signed)
 I gave [name]'s recommendation to pt to f/u with ENT as scheduled next week. She said she will see ENT however, she is not going to schedule any ENT surgery until she has recommendation from [name].   She still wants Sherrod to consider ordering a PET instead of a CT chest because of new cancer on vocal cord.

## 2024-05-22 ENCOUNTER — Other Ambulatory Visit: Payer: Self-pay | Admitting: Internal Medicine

## 2024-05-22 DIAGNOSIS — C3490 Malignant neoplasm of unspecified part of unspecified bronchus or lung: Secondary | ICD-10-CM

## 2024-05-24 ENCOUNTER — Telehealth (INDEPENDENT_AMBULATORY_CARE_PROVIDER_SITE_OTHER): Payer: Self-pay

## 2024-05-24 ENCOUNTER — Encounter (INDEPENDENT_AMBULATORY_CARE_PROVIDER_SITE_OTHER): Admitting: Physician Assistant

## 2024-05-24 NOTE — Telephone Encounter (Signed)
 Patient was called concerning her Pre-Op visit today being moved to another provider. Patient questioned if our PA is authorized to scope her. I explained to the patient that Chyrl Cohen is authorized to scope as he is also a provider. I also explained to her that her visit today is just to prepare her for surgery and give her an idea of what to expect. Patient said that she did not see a reason to come in if she was not being scoped and that she was already prepped for surgery before. Patient then went on to explain that something had come up with her cancer doctor and that she did not want to do her surgery until she sees her cancer doctor on 8/7. She also explained that she has a PET scan on 7/31. She explained to me that she wanted her visit and surgery cancelled. (Just letting Chyrl Cohen and Dr. Okey know)

## 2024-06-01 ENCOUNTER — Ambulatory Visit (HOSPITAL_COMMUNITY): Admit: 2024-06-01

## 2024-06-01 SURGERY — MICROLARYNGOSCOPY, WITH PROCEDURE USING LASER
Anesthesia: Choice

## 2024-06-06 ENCOUNTER — Encounter (INDEPENDENT_AMBULATORY_CARE_PROVIDER_SITE_OTHER): Payer: Self-pay | Admitting: Gastroenterology

## 2024-06-07 DIAGNOSIS — F17210 Nicotine dependence, cigarettes, uncomplicated: Secondary | ICD-10-CM | POA: Diagnosis not present

## 2024-06-07 DIAGNOSIS — M19071 Primary osteoarthritis, right ankle and foot: Secondary | ICD-10-CM | POA: Diagnosis not present

## 2024-06-07 DIAGNOSIS — R1011 Right upper quadrant pain: Secondary | ICD-10-CM | POA: Diagnosis not present

## 2024-06-07 DIAGNOSIS — R030 Elevated blood-pressure reading, without diagnosis of hypertension: Secondary | ICD-10-CM | POA: Diagnosis not present

## 2024-06-08 ENCOUNTER — Other Ambulatory Visit: Payer: Self-pay

## 2024-06-08 DIAGNOSIS — C3490 Malignant neoplasm of unspecified part of unspecified bronchus or lung: Secondary | ICD-10-CM

## 2024-06-09 ENCOUNTER — Other Ambulatory Visit (HOSPITAL_COMMUNITY)

## 2024-06-09 ENCOUNTER — Inpatient Hospital Stay: Attending: Family Medicine

## 2024-06-09 ENCOUNTER — Ambulatory Visit (HOSPITAL_COMMUNITY)
Admission: RE | Admit: 2024-06-09 | Discharge: 2024-06-09 | Disposition: A | Discharge status: Home / Self Care | Source: Ambulatory Visit | Attending: Internal Medicine | Admitting: Internal Medicine

## 2024-06-09 ENCOUNTER — Other Ambulatory Visit (HOSPITAL_COMMUNITY)
Admission: RE | Admit: 2024-06-09 | Discharge: 2024-06-09 | Disposition: A | Discharge status: Home / Self Care | Source: Ambulatory Visit | Attending: Physician Assistant | Admitting: Physician Assistant

## 2024-06-09 DIAGNOSIS — C3490 Malignant neoplasm of unspecified part of unspecified bronchus or lung: Secondary | ICD-10-CM | POA: Diagnosis not present

## 2024-06-09 DIAGNOSIS — C320 Malignant neoplasm of glottis: Secondary | ICD-10-CM | POA: Diagnosis not present

## 2024-06-09 LAB — CBC WITH DIFFERENTIAL/PLATELET
Abs Immature Granulocytes: 0.04 K/uL (ref 0.00–0.07)
Basophils Absolute: 0.1 K/uL (ref 0.0–0.1)
Basophils Relative: 1 %
Eosinophils Absolute: 0.2 K/uL (ref 0.0–0.5)
Eosinophils Relative: 2 %
HCT: 42.9 % (ref 36.0–46.0)
Hemoglobin: 14.2 g/dL (ref 12.0–15.0)
Immature Granulocytes: 0 %
Lymphocytes Relative: 24 %
Lymphs Abs: 2.4 K/uL (ref 0.7–4.0)
MCH: 32.2 pg (ref 26.0–34.0)
MCHC: 33.1 g/dL (ref 30.0–36.0)
MCV: 97.3 fL (ref 80.0–100.0)
Monocytes Absolute: 1 K/uL (ref 0.1–1.0)
Monocytes Relative: 9 %
Neutro Abs: 6.6 K/uL (ref 1.7–7.7)
Neutrophils Relative %: 64 %
Platelets: 259 K/uL (ref 150–400)
RBC: 4.41 MIL/uL (ref 3.87–5.11)
RDW: 14.2 % (ref 11.5–15.5)
WBC: 10.3 K/uL (ref 4.0–10.5)
nRBC: 0 % (ref 0.0–0.2)

## 2024-06-09 LAB — COMPREHENSIVE METABOLIC PANEL WITH GFR
ALT: 34 U/L (ref 0–44)
AST: 30 U/L (ref 15–41)
Albumin: 4.4 g/dL (ref 3.5–5.0)
Alkaline Phosphatase: 61 U/L (ref 38–126)
Anion gap: 10 (ref 5–15)
BUN: 19 mg/dL (ref 8–23)
CO2: 24 mmol/L (ref 22–32)
Calcium: 9.3 mg/dL (ref 8.9–10.3)
Chloride: 105 mmol/L (ref 98–111)
Creatinine, Ser: 0.62 mg/dL (ref 0.44–1.00)
GFR, Estimated: >60 mL/min (ref >60)
Glucose, Bld: 101 mg/dL — ABNORMAL HIGH (ref 70–99)
Potassium: 4.4 mmol/L (ref 3.5–5.1)
Sodium: 139 mmol/L (ref 135–145)
Total Bilirubin: 0.7 mg/dL (ref 0.0–1.2)
Total Protein: 7.4 g/dL (ref 6.5–8.1)

## 2024-06-09 MED ORDER — FLUDEOXYGLUCOSE F - 18 (FDG) INJECTION
6.4500 | Freq: Once | INTRAVENOUS | Status: AC | PRN
  Administered 2024-06-09: 6.45 via INTRAVENOUS

## 2024-06-16 ENCOUNTER — Encounter: Payer: Self-pay | Admitting: Family Medicine

## 2024-06-16 ENCOUNTER — Inpatient Hospital Stay: Payer: 59 | Attending: Family Medicine | Admitting: Internal Medicine

## 2024-06-16 VITALS — BP 139/70 | HR 72 | Temp 98.3°F | Resp 16 | Ht 63.0 in | Wt 114.0 lb

## 2024-06-16 DIAGNOSIS — R229 Localized swelling, mass and lump, unspecified: Secondary | ICD-10-CM | POA: Diagnosis not present

## 2024-06-16 DIAGNOSIS — Z85118 Personal history of other malignant neoplasm of bronchus and lung: Secondary | ICD-10-CM | POA: Insufficient documentation

## 2024-06-16 DIAGNOSIS — C3490 Malignant neoplasm of unspecified part of unspecified bronchus or lung: Secondary | ICD-10-CM

## 2024-06-16 DIAGNOSIS — D020 Carcinoma in situ of larynx: Secondary | ICD-10-CM | POA: Diagnosis not present

## 2024-06-16 NOTE — Progress Notes (Signed)
 Faxed referral to [facility]NT with confirmation at [phone].
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
## 2024-06-20 ENCOUNTER — Encounter (INDEPENDENT_AMBULATORY_CARE_PROVIDER_SITE_OTHER): Admitting: Otolaryngology

## 2024-07-05 ENCOUNTER — Ambulatory Visit: Admitting: Family Medicine

## 2024-07-05 DIAGNOSIS — C320 Malignant neoplasm of glottis: Secondary | ICD-10-CM | POA: Diagnosis not present

## 2024-07-05 DIAGNOSIS — D020 Carcinoma in situ of larynx: Secondary | ICD-10-CM | POA: Diagnosis not present

## 2024-07-07 DIAGNOSIS — I482 Chronic atrial fibrillation, unspecified: Secondary | ICD-10-CM | POA: Diagnosis not present

## 2024-07-07 DIAGNOSIS — F17210 Nicotine dependence, cigarettes, uncomplicated: Secondary | ICD-10-CM | POA: Diagnosis not present

## 2024-07-07 DIAGNOSIS — R030 Elevated blood-pressure reading, without diagnosis of hypertension: Secondary | ICD-10-CM | POA: Diagnosis not present

## 2024-07-07 DIAGNOSIS — M19071 Primary osteoarthritis, right ankle and foot: Secondary | ICD-10-CM | POA: Diagnosis not present

## 2024-07-12 ENCOUNTER — Ambulatory Visit (INDEPENDENT_AMBULATORY_CARE_PROVIDER_SITE_OTHER): Payer: 59

## 2024-07-12 VITALS — BP 144/60 | Ht 63.0 in | Wt 114.0 lb

## 2024-07-12 DIAGNOSIS — Z0000 Encounter for general adult medical examination without abnormal findings: Secondary | ICD-10-CM

## 2024-07-12 DIAGNOSIS — Z1231 Encounter for screening mammogram for malignant neoplasm of breast: Secondary | ICD-10-CM

## 2024-07-12 NOTE — Progress Notes (Signed)
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 summary with patients personalized plan was offered to patient via MyChart   Notes: Nothing significant to report at this time.

## 2024-07-12 NOTE — Patient Instructions (Signed)
 [name] , Thank you for taking time out of your busy schedule to complete your Annual Wellness Visit with me. I enjoyed our conversation and look forward to speaking with you again next year. I, as well as your care team,  appreciate your ongoing commitment to your health goals. Please review the following plan we discussed and let me know if I can assist you in the future.  Your Game plan/ To Do List  Referrals/Orders: If you haven't heard from the office you've been referred to, please reach out to them at the phone provided.   To Schedule Your Mammogram, Call:  [facility] @  [phone]   Follow up Visits: We will see or speak with you next year for your Next Medicare AWV with our clinical staff  Clinician Recommendations:  Aim for 30 minutes of exercise or brisk walking, 6-8 glasses of water, and 5 servings of fruits and vegetables each day.    Wishing you many blessings and good health during the next year until our next visit.  -Garon Melander   This is a list of the screenings recommended for you:  Health Maintenance  Topic Date Due   Flu Shot  06/10/2024   COVID-19 Vaccine (9 - 2025-26 season) 07/11/2024   Mammogram  09/27/2024   Medicare Annual Wellness Visit  07/12/2025   Colon Cancer Screening  09/14/2026   DTaP/Tdap/Td vaccine (3 - Td or Tdap) 06/30/2033   Pneumococcal Vaccine for age over 33  Completed   DEXA scan (bone density measurement)  Completed   Hepatitis C Screening  Completed   Zoster (Shingles) Vaccine  Completed   HPV Vaccine  Aged Out   Meningitis B Vaccine  Aged Out    Advanced directives: (Copy Requested) Please bring a copy of your health care power of attorney and living will to the office to be added to your chart at your convenience. You can mail to [facility] [address] [place], [place] [address] or email to [email or web address] Advance Care Planning is important because it:  [x]  Makes sure you receive the medical  care that is consistent with your values, goals, and preferences  [x]  It provides guidance to your family and loved ones and reduces their decisional burden about whether or not they are making the right decisions based on your wishes.  Follow the link provided in your after visit summary or read over the paperwork we have mailed to you to help you started getting your Advance Directives in place. If you need assistance in completing these, please reach out to us  so that we can help you!  Information on Advanced Care Planning can be found at [place]  Secretary of [facility] Advance Health Care Directives Advance Health Care Directives ([email or web address]   See attachments for Preventive Care and Fall Prevention Tips.

## 2024-07-13 ENCOUNTER — Encounter: Payer: Self-pay | Admitting: Family Medicine

## 2024-07-13 ENCOUNTER — Telehealth: Payer: Self-pay | Admitting: Family Medicine

## 2024-07-13 ENCOUNTER — Ambulatory Visit (INDEPENDENT_AMBULATORY_CARE_PROVIDER_SITE_OTHER): Admitting: Family Medicine

## 2024-07-13 VITALS — BP 111/64 | HR 82 | Resp 18 | Ht 63.0 in | Wt 114.1 lb

## 2024-07-13 DIAGNOSIS — Z23 Encounter for immunization: Secondary | ICD-10-CM

## 2024-07-13 DIAGNOSIS — F17200 Nicotine dependence, unspecified, uncomplicated: Secondary | ICD-10-CM | POA: Diagnosis not present

## 2024-07-13 DIAGNOSIS — F17210 Nicotine dependence, cigarettes, uncomplicated: Secondary | ICD-10-CM

## 2024-07-13 DIAGNOSIS — H04123 Dry eye syndrome of bilateral lacrimal glands: Secondary | ICD-10-CM | POA: Diagnosis not present

## 2024-07-13 DIAGNOSIS — I10 Essential (primary) hypertension: Secondary | ICD-10-CM | POA: Diagnosis not present

## 2024-07-13 DIAGNOSIS — E559 Vitamin D deficiency, unspecified: Secondary | ICD-10-CM

## 2024-07-13 DIAGNOSIS — E782 Mixed hyperlipidemia: Secondary | ICD-10-CM | POA: Diagnosis not present

## 2024-07-13 DIAGNOSIS — R682 Dry mouth, unspecified: Secondary | ICD-10-CM | POA: Diagnosis not present

## 2024-07-13 NOTE — Patient Instructions (Addendum)
 Annual exam end January or early February.  Flu vaccine today  Let me know if you want to be referred to GI doc re weight loss  Continue to work on quitting smoking  You will be referred to Rheumatology for evaluation of dry mouth , dry eyes to se if there is underlying disease causing this  Thankful vocal cords are much better  Fasting lipid, cmp and EGFRr, TSH and vit D today if fasting pls  Happy Birthday when it comes! Thanks for choosing [facility], we consider it a privelige to serve you.

## 2024-07-13 NOTE — Telephone Encounter (Signed)
 Link that was given to her  for some help she does not live in [place] she [place] so the link of information that was given to her does not apply to her.

## 2024-07-15 NOTE — Telephone Encounter (Signed)
 Additional resources sent. Resources are chosen based on zip code associated with address in patients chart which was a [place] address. Many times, it doesn't matter the  [place]. Resources emailed to patient.

## 2024-07-20 ENCOUNTER — Encounter: Payer: Self-pay | Admitting: Family Medicine

## 2024-07-20 ENCOUNTER — Other Ambulatory Visit: Payer: Self-pay | Admitting: Medical Genetics

## 2024-07-20 DIAGNOSIS — Z23 Encounter for immunization: Secondary | ICD-10-CM | POA: Insufficient documentation

## 2024-07-20 DIAGNOSIS — R682 Dry mouth, unspecified: Secondary | ICD-10-CM | POA: Insufficient documentation

## 2024-07-20 NOTE — Assessment & Plan Note (Signed)
 Refer rheumatology for eval

## 2024-07-20 NOTE — Assessment & Plan Note (Signed)
 After obtaining informed consent, the influenza vaccine is  administered , with no adverse effect noted at the time of administration.

## 2024-07-20 NOTE — Assessment & Plan Note (Signed)
 Hyperlipidemia:Low fat diet discussed and encouraged.   Lipid Panel  Lab Results  Component Value Date   CHOL 130 07/01/2023   HDL 63 07/01/2023   LDLCALC 45 07/01/2023   TRIG 128 07/01/2023   CHOLHDL 2.1 07/01/2023     Controlled, no change in medication

## 2024-07-20 NOTE — Assessment & Plan Note (Signed)
Asked:confirms currently smokes cigarettes Assess: Unwilling to set a quit date, but is trying to cut back Advise: needs to QUIT to reduce risk of cancer, cardio and cerebrovascular disease Assist: counseled for 5 minutes and literature provided Arrange: follow up in 2 to 4 months  

## 2024-07-20 NOTE — Assessment & Plan Note (Signed)
 Updated lab needed at/ before next visit.

## 2024-07-20 NOTE — Assessment & Plan Note (Signed)
 Controlled, no change in medication

## 2024-07-20 NOTE — Progress Notes (Signed)
   [name]     MRN: [id number]      DOB: 1950/11/19  Chief Complaint  Patient presents with   Hypertension    3 month follow up     HPI [name] is here for follow up and re-evaluation of chronic medical conditions, medication management and review of any available recent lab and radiology data.  Preventive health is updated, specifically  Cancer screening and Immunization.   Questions or concerns regarding consultations or procedures which the PT has had in the interim are  addressed. C/o weigt loss states eats well has just cut out sugar The PT denies any adverse reactions to current medications since the last visit.  C/o dry mouth and dry eyes for years  Has improved phonation post surgery and excellent post surgical healing Working on quitting, quit line stresses her out so not using this   ROS Denies recent fever or chills. Denies sinus pressure, nasal congestion, ear pain or sore throat. Denies chest congestion, productive cough or wheezing. Denies chest pains, palpitations and leg swelling Denies abdominal pain, nausea, vomiting,diarrhea or constipation.   Denies dysuria, frequency, hesitancy or incontinence. . Denies headaches, seizures, numbness, or tingling. . Denies skin break down or rash.   PE  BP 111/64   Pulse 82   Resp 18   Ht 5' 3 (1.6 m)   Wt 114 lb 1.9 oz (51.8 kg)   SpO2 91%   BMI 20.22 kg/m   Patient alert and oriented and in no cardiopulmonary distress.  HEENT: No facial asymmetry, EOMI,     Neck supple .  Chest: Clear to auscultation bilaterally.decreased air entry  CVS: S1, S2 no murmurs, no S3.Regular rate.  ABD: Soft non tender.   Ext: No edema  MS: Adequate ROM spine, shoulders, hips and knees.  Skin: Intact, no ulcerations or rash noted.  Psych: Good eye contact, normal affect. Memory intact not anxious or depressed appearing.  CNS: CN 2-12 intact, power,  normal throughout.no focal deficits noted.   Assessment &  Plan  Immunization due After obtaining informed consent, the influenza  vaccine is  administered , with no adverse effect noted at the time of administration.   Essential hypertension Controlled, no change in medication   Hyperlipemia Hyperlipidemia:Low fat diet discussed and encouraged.   Lipid Panel  Lab Results  Component Value Date   CHOL 130 07/01/2023   HDL 63 07/01/2023   LDLCALC 45 07/01/2023   TRIG 128 07/01/2023   CHOLHDL 2.1 07/01/2023     Controlled, no change in medication   Vitamin D  deficiency Updated lab needed at/ before next visit.   Dry mouth and eyes Refer rheumatology for eval

## 2024-07-21 ENCOUNTER — Other Ambulatory Visit (HOSPITAL_COMMUNITY)
Admission: RE | Admit: 2024-07-21 | Discharge: 2024-07-21 | Disposition: A | Discharge status: Home / Self Care | Payer: Self-pay | Source: Ambulatory Visit | Attending: Medical Genetics | Admitting: Medical Genetics

## 2024-07-21 DIAGNOSIS — E559 Vitamin D deficiency, unspecified: Secondary | ICD-10-CM | POA: Diagnosis not present

## 2024-07-21 DIAGNOSIS — E782 Mixed hyperlipidemia: Secondary | ICD-10-CM | POA: Diagnosis not present

## 2024-07-21 DIAGNOSIS — I10 Essential (primary) hypertension: Secondary | ICD-10-CM | POA: Diagnosis not present

## 2024-07-22 LAB — CMP14+EGFR
ALT: 46 IU/L — ABNORMAL HIGH (ref 0–32)
AST: 35 IU/L (ref 0–40)
Albumin: 4.8 g/dL (ref 3.8–4.8)
Alkaline Phosphatase: 72 IU/L (ref 44–121)
BUN/Creatinine Ratio: 18 (ref 12–28)
BUN: 12 mg/dL (ref 8–27)
Bilirubin Total: 0.4 mg/dL (ref 0.0–1.2)
CO2: 24 mmol/L (ref 20–29)
Calcium: 9.5 mg/dL (ref 8.7–10.3)
Chloride: 102 mmol/L (ref 96–106)
Creatinine, Ser: 0.66 mg/dL (ref 0.57–1.00)
Globulin, Total: 2 g/dL (ref 1.5–4.5)
Glucose: 93 mg/dL (ref 70–99)
Potassium: 4.8 mmol/L (ref 3.5–5.2)
Sodium: 140 mmol/L (ref 134–144)
Total Protein: 6.8 g/dL (ref 6.0–8.5)
eGFR: 92 mL/min/1.73 (ref >59)

## 2024-07-22 LAB — LIPID PANEL
Chol/HDL Ratio: 2.3 ratio (ref 0.0–4.4)
Cholesterol, Total: 157 mg/dL (ref 100–199)
HDL: 67 mg/dL (ref >39)
LDL Chol Calc (NIH): 68 mg/dL (ref 0–99)
Triglycerides: 125 mg/dL (ref 0–149)
VLDL Cholesterol Cal: 22 mg/dL (ref 5–40)

## 2024-07-22 LAB — TSH: TSH: 2.71 u[IU]/mL (ref 0.450–4.500)

## 2024-07-22 LAB — VITAMIN D 25 HYDROXY (VIT D DEFICIENCY, FRACTURES): Vit D, 25-Hydroxy: 29 ng/mL — ABNORMAL LOW (ref 30.0–100.0)

## 2024-07-31 LAB — GENECONNECT MOLECULAR SCREEN: Genetic Analysis Overall Interpretation: NEGATIVE

## 2024-08-01 ENCOUNTER — Other Ambulatory Visit: Payer: Self-pay | Admitting: Family Medicine

## 2024-08-02 DIAGNOSIS — E559 Vitamin D deficiency, unspecified: Secondary | ICD-10-CM | POA: Diagnosis not present

## 2024-08-02 DIAGNOSIS — Z79899 Other long term (current) drug therapy: Secondary | ICD-10-CM | POA: Diagnosis not present

## 2024-08-02 DIAGNOSIS — M19071 Primary osteoarthritis, right ankle and foot: Secondary | ICD-10-CM | POA: Diagnosis not present

## 2024-08-02 DIAGNOSIS — R030 Elevated blood-pressure reading, without diagnosis of hypertension: Secondary | ICD-10-CM | POA: Diagnosis not present

## 2024-08-02 DIAGNOSIS — I482 Chronic atrial fibrillation, unspecified: Secondary | ICD-10-CM | POA: Diagnosis not present

## 2024-08-02 DIAGNOSIS — F17210 Nicotine dependence, cigarettes, uncomplicated: Secondary | ICD-10-CM | POA: Diagnosis not present

## 2024-08-04 DIAGNOSIS — Z79899 Other long term (current) drug therapy: Secondary | ICD-10-CM | POA: Diagnosis not present

## 2024-08-24 ENCOUNTER — Encounter (INDEPENDENT_AMBULATORY_CARE_PROVIDER_SITE_OTHER): Payer: Self-pay | Admitting: Gastroenterology

## 2024-08-30 ENCOUNTER — Ambulatory Visit (HOSPITAL_BASED_OUTPATIENT_CLINIC_OR_DEPARTMENT_OTHER)
Admission: RE | Admit: 2024-08-30 | Discharge: 2024-08-30 | Disposition: A | Discharge status: Home / Self Care | Source: Ambulatory Visit | Attending: Vascular Surgery | Admitting: Vascular Surgery

## 2024-08-30 ENCOUNTER — Ambulatory Visit: Admitting: Vascular Surgery

## 2024-08-30 ENCOUNTER — Encounter: Payer: Self-pay | Admitting: Vascular Surgery

## 2024-08-30 ENCOUNTER — Ambulatory Visit (HOSPITAL_COMMUNITY)
Admission: RE | Admit: 2024-08-30 | Discharge: 2024-08-30 | Disposition: A | Discharge status: Home / Self Care | Source: Ambulatory Visit | Attending: Vascular Surgery | Admitting: Vascular Surgery

## 2024-08-30 VITALS — BP 128/72 | HR 77 | Temp 98.2°F | Resp 18 | Ht 63.0 in | Wt 114.9 lb

## 2024-08-30 DIAGNOSIS — I70219 Atherosclerosis of native arteries of extremities with intermittent claudication, unspecified extremity: Secondary | ICD-10-CM | POA: Insufficient documentation

## 2024-08-30 DIAGNOSIS — I700 Atherosclerosis of aorta: Secondary | ICD-10-CM | POA: Diagnosis not present

## 2024-08-30 LAB — VAS US ABI WITH/WO TBI
Left ABI: 1.13
Right ABI: 1.13
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
## 2024-08-31 ENCOUNTER — Other Ambulatory Visit: Payer: Self-pay

## 2024-08-31 DIAGNOSIS — I700 Atherosclerosis of aorta: Secondary | ICD-10-CM

## 2024-09-06 ENCOUNTER — Encounter: Payer: Self-pay | Admitting: Cardiovascular Disease

## 2024-09-06 DIAGNOSIS — M19071 Primary osteoarthritis, right ankle and foot: Secondary | ICD-10-CM | POA: Diagnosis not present

## 2024-09-06 DIAGNOSIS — I482 Chronic atrial fibrillation, unspecified: Secondary | ICD-10-CM | POA: Diagnosis not present

## 2024-09-06 DIAGNOSIS — R030 Elevated blood-pressure reading, without diagnosis of hypertension: Secondary | ICD-10-CM | POA: Diagnosis not present

## 2024-09-06 DIAGNOSIS — F17210 Nicotine dependence, cigarettes, uncomplicated: Secondary | ICD-10-CM | POA: Diagnosis not present

## 2024-09-09 ENCOUNTER — Encounter: Payer: Self-pay | Admitting: Family Medicine

## 2024-09-12 ENCOUNTER — Ambulatory Visit: Payer: Self-pay | Admitting: Family Medicine

## 2024-09-13 DIAGNOSIS — H532 Diplopia: Secondary | ICD-10-CM | POA: Diagnosis not present

## 2024-09-13 DIAGNOSIS — Z961 Presence of intraocular lens: Secondary | ICD-10-CM | POA: Diagnosis not present

## 2024-09-13 DIAGNOSIS — H0288B Meibomian gland dysfunction left eye, upper and lower eyelids: Secondary | ICD-10-CM | POA: Diagnosis not present

## 2024-09-13 DIAGNOSIS — H04123 Dry eye syndrome of bilateral lacrimal glands: Secondary | ICD-10-CM | POA: Diagnosis not present

## 2024-09-13 DIAGNOSIS — H1013 Acute atopic conjunctivitis, bilateral: Secondary | ICD-10-CM | POA: Diagnosis not present

## 2024-09-13 DIAGNOSIS — H0288A Meibomian gland dysfunction right eye, upper and lower eyelids: Secondary | ICD-10-CM | POA: Diagnosis not present

## 2024-09-13 DIAGNOSIS — H43812 Vitreous degeneration, left eye: Secondary | ICD-10-CM | POA: Diagnosis not present

## 2024-09-14 ENCOUNTER — Telehealth: Payer: Self-pay | Admitting: Cardiovascular Disease

## 2024-09-14 ENCOUNTER — Other Ambulatory Visit: Payer: Self-pay

## 2024-09-14 DIAGNOSIS — R7401 Elevation of levels of liver transaminase levels: Secondary | ICD-10-CM

## 2024-09-14 MED ORDER — CLOPIDOGREL BISULFATE 75 MG PO TABS: 37.5000 mg | ORAL_TABLET | Freq: Every morning | ORAL | 1 refills | Status: AC

## 2024-09-14 NOTE — Telephone Encounter (Signed)
*  STAT* If patient is at the pharmacy, call can be transferred to refill team.   1. Which medications need to be refilled? (please list name of each medication and dose if known) clopidogrel  (PLAVIX ) 75 MG tablet    2. Would you like to learn more about the convenience, safety, & potential cost savings by using the [facility]? No   3. Are you open to using the [facility] (Type [facility].) No   4. Which pharmacy/location (including street and city if local pharmacy) is medication to be sent to? [facility] - [place], [place] - [address]    5. Do they need a 30 day or 90 day supply? 90 day

## 2024-09-14 NOTE — Telephone Encounter (Signed)
 Pt's medication was sent to pt's pharmacy as requested. Confirmation received.

## 2024-09-23 ENCOUNTER — Encounter

## 2024-09-26 DIAGNOSIS — R7401 Elevation of levels of liver transaminase levels: Secondary | ICD-10-CM | POA: Diagnosis not present

## 2024-09-27 ENCOUNTER — Ambulatory Visit: Payer: Self-pay | Admitting: Family Medicine

## 2024-09-27 LAB — HEPATIC FUNCTION PANEL
ALT: 11 IU/L (ref 0–32)
AST: 19 IU/L (ref 0–40)
Albumin: 4.4 g/dL (ref 3.8–4.8)
Alkaline Phosphatase: 71 IU/L (ref 49–135)
Bilirubin Total: 0.3 mg/dL (ref 0.0–1.2)
Bilirubin, Direct: 0.13 mg/dL (ref 0.00–0.40)
Total Protein: 6.9 g/dL (ref 6.0–8.5)

## 2024-10-04 DIAGNOSIS — R748 Abnormal levels of other serum enzymes: Secondary | ICD-10-CM | POA: Diagnosis not present

## 2024-10-04 DIAGNOSIS — I482 Chronic atrial fibrillation, unspecified: Secondary | ICD-10-CM | POA: Diagnosis not present

## 2024-10-04 DIAGNOSIS — F17210 Nicotine dependence, cigarettes, uncomplicated: Secondary | ICD-10-CM | POA: Diagnosis not present

## 2024-10-04 DIAGNOSIS — M19071 Primary osteoarthritis, right ankle and foot: Secondary | ICD-10-CM | POA: Diagnosis not present

## 2024-10-04 DIAGNOSIS — I739 Peripheral vascular disease, unspecified: Secondary | ICD-10-CM | POA: Diagnosis not present

## 2024-10-17 NOTE — Progress Notes (Deleted)
 Cardiology Office Note:  .   Date:  10/17/2024  ID:  [name], DOB 1950/12/23, MRN [id number] PCP: [name], MD  [facility] Providers Cardiologist:  Maude Emmer, MD Electrophysiologist:  Danelle Birmingham, MD {  History of Present Illness: .   [name] is a 73 y.o. female  with PMHx of CAD, paroxysmal atrial fibrillation/AVNRT, carotid artery stenosis, PAD, history of lung cancer (s/p RLL lobectomy), HTN, HLD, Palpitations, Dilation of Descending Thoracic Aorta, COPD and history of GI bleed who reports to [facility] office for follow up.   Pertinent cardiac medical history:  CAD  s/p Coronary CTA in 12/2022 showing mild to moderate nonobstructive disease and not significant by [facility] medical management recommended. Paroxysmal atrial fibrillation/AVNRT  s/p ablation in 2014, repeat SVT ablation in 03/2023 Carotid artery stenosis Carotid US  11/2022: 50 to 69% stenosis along the LICA and no significant stenosis along the RICA.  PAD  s/p stenting of bilateral common iliac arteries in 06/2023 Palpitations  Previously reviewed Zio versus restarting beta-blocker therapy.  Patient preferred to restart Toprol  XL.  Dilation of Descending Thoracic Aorta Prior Chest CT at 06/2023 showed a 3.5 cm aneurysmal dilatation of the descending thoracic aorta with associated contour abnormality likely due to chronic penetrating atherosclerotic ulcer.    Last seen in heartcare 03/01/2024 by Laymon Qua, PA-C for 88-month follow-up.  Reported baseline dyspnea with no acute changes.  Also reported improvement in palpitations with restarting Toprol  12.5 mg daily.  Noted hand cramps resolved after increasing hydration.  Denied any chest pain but reported 2-3 episodes of jaw pain over the past 6 months that occurred at rest and spontaneously resolved.  Also, reported frustrations related to her vocal cord surgery that makes it difficult for her to be able to tolerate and use breathalyzer  in her car due to DUI over 20 years ago.  She tried following up with pulmonary in regards to follow-up testing and they recommended a modified breathalyzer, however they keep referring her to ED. continued on Plavix  75 mg daily (on this per vascular), Crestor  20 mg daily, Zetia  10 mg daily, Toprol  XL 12.5 mg daily, hydralazine  25 mg 3 times daily, Valsartan  320 mg daily.   Recent hospitalization   Today, reports ### and denies ###.  Denies chest pain, shortness of breath, palpitations, syncope, presyncope, dizziness, orthopnea, PND, swelling or significant weight changes, acute bleeding, or claudication.   Reports compliance with medications.  Dietary habitats:  Activity level:  Social: Denies tobacco use/alcohol /drug use  Denies any recent hospitalizations or visits to the emergency department.   Coronary artery disease involving native coronary artery of native heart without angina pectoris Hyperlipidemia LDL goal <55 Coronary CTA in 2024 as above showed mild to moderate nonobstructive disease and not significant by FFR Denies angina symptoms. No need for further ischemic evaluation at this time.  LDL 68 in 07/21/2024.  Increase Crestor  20 mg to 40 mg daily.  Continue Plavix  75 mg daily (on this per vascular), Zetia  10 mg daily, Toprol  XL 12.5 mg daily.    AVNRT (AV nodal re-entry tachycardia) (HCC) Palpitations Reports palpitations significantly improved following resumption of beta-blocker therapy. Continue Toprol -XL 12.5 mg daily Previously on flecainide  but this was discontinued given CAD by coronary CTA.   PAD  Carotid Artery Stenosis Carotid US  11/2022: 50 to 69% stenosis along the LICA and no significant stenosis along the RICA.  s/p stenting of bilateral common iliac arteries in 06/2023 Followed by vascular surgery Continue on  Plavix , Zetia , and Crestor  as above  Aneurysm of descending thoracic aorta without rupture Prior Chest CT at 06/2023 showed a 3.5 cm aneurysmal  dilatation of the descending thoracic aorta with associated contour abnormality likely due to chronic penetrating atherosclerotic ulcer.  Previously reported anticipating a follow-up CT by oncology in 06/2024.  No repeat imaging located in chart review.  Essential hypertension Reports well controlled Home BP:  BP this OV well controlled today:  Continue on Toprol  XL 12.5 mg daily, hydralazine  25 mg 3 times daily, Valsartan  320 mg daily.  Encourage physical activity for 150 minutes per week and heart healthy low sodium diet. Discussed limiting sodium intake to < 2 grams daily.     ROS: 10 point review of system has been reviewed and considered negative except ones been listed in the HPI.   Studies Reviewed: SABRA   Coronary CTA: 12/2022 IMPRESSION: 1. Heavy coronary calcification with probably mild to moderate mixed non-obstructive CAD, CADRADS = 3. CT FFR will be performed and reported separately. 2. Coronary calcium  score of 2260. This was 99th percentile for age and sex matched control. 3. Normal coronary origin with right dominance. 4. Heavy aortic atherosclerosis - particularly the descending aorta 5. Mild aortic valve leaflet and annular calcification mild posterior mitral annular calcification.   1. Left Main:  No significant stenosis. FFR = 1.00 2. LAD: No significant stenosis. Proximal FFR = 0.98, Mid FFR = 0.96, Distal FFR = not reported 3. LCX: No significant stenosis. Proximal FFR = 0.98, Distal FFR = 0.93 4. RCA: No significant stenosis. Proximal FFR = 1.00, Mid FFR = 0.92, Distal FFR = 0.91   IMPRESSION: 1.  CT FFR analysis did not show any significant stenosis.   Event Monitor: 01/2023 Patch Wear Time:  13 days and 1 hours (2024-02-28T13:53:49-0500 to 2024-03-12T16:11:11-398)   Patient had a min HR of 56 bpm, max HR of 231 bpm, and avg HR of 85 bpm. Predominant underlying rhythm was Sinus Rhythm. 397 Supraventricular Tachycardia runs occurred, the run with the fastest  interval lasting 51.0 secs with a max rate of 231 bpm, the longest lasting 3 mins 15 secs with an avg rate of 186 bpm. Supraventricular Tachycardia was detected within +/- 45 seconds of symptomatic patient event(s). Isolated SVEs were frequent (7.7%, [id number]), SVE Couplets were occasional (2.0%, 15413), and SVE Triplets were rare (<1.0%, 1459). Isolated VEs were rare (<1.0%), VE Couplets were rare (<1.0%), and no VE Triplets were present. Ventricular Bigeminy and Trigeminy were present. MD notification criteria for Supraventricular Tachycardia met - report posted prior to notification per account request (MJ). Risk Assessment/Calculations:   {Does this patient have ATRIAL FIBRILLATION?[phone]} No BP recorded.  {Refresh Note OR Click here to enter BP  :1}***       Physical Exam:   VS:  There were no vitals taken for this visit.   Wt Readings from Last 3 Encounters:  08/30/24 114 lb 14.4 oz (52.1 kg)  07/13/24 114 lb 1.9 oz (51.8 kg)  07/12/24 114 lb (51.7 kg)    GEN: Well nourished, well developed in no acute distress while sitting in chair.  NECK: No JVD; No carotid bruits CARDIAC: ***RRR, no murmurs, rubs, gallops RESPIRATORY:  Clear to auscultation without rales, wheezing or rhonchi  ABDOMEN: Soft, non-tender, non-distended EXTREMITIES:  No edema; No deformity   ASSESSMENT AND PLAN: .   ***    {Are you ordering a CV Procedure (e.g. stress test, cath, DCCV, TEE, etc)?   Press F2        :  [id number]}  Dispo: ***  Signed, Lorette CINDERELLA Kapur, PA-C

## 2024-10-18 ENCOUNTER — Ambulatory Visit: Admitting: Physician Assistant

## 2024-10-18 DIAGNOSIS — I739 Peripheral vascular disease, unspecified: Secondary | ICD-10-CM

## 2024-10-18 DIAGNOSIS — E785 Hyperlipidemia, unspecified: Secondary | ICD-10-CM

## 2024-10-18 DIAGNOSIS — I10 Essential (primary) hypertension: Secondary | ICD-10-CM

## 2024-10-18 DIAGNOSIS — R002 Palpitations: Secondary | ICD-10-CM

## 2024-10-18 DIAGNOSIS — I7123 Aneurysm of the descending thoracic aorta, without rupture: Secondary | ICD-10-CM

## 2024-10-18 DIAGNOSIS — I4719 Other supraventricular tachycardia: Secondary | ICD-10-CM

## 2024-10-18 DIAGNOSIS — I2510 Atherosclerotic heart disease of native coronary artery without angina pectoris: Secondary | ICD-10-CM

## 2024-10-31 NOTE — Progress Notes (Signed)
 Pharmacy Quality Measure Review  This patient is appearing on a report for being at risk of failing the adherence measure for hypertension (ACEi/ARB) medications this calendar year.   Medication: Valsartan  320 mg Last fill date: 05/17/24 for 90 day supply  Will collaborate with embedded pharmacist to send in more refills.  Jenkins Graces, PharmD PGY1 Pharmacy Resident

## 2024-11-09 ENCOUNTER — Other Ambulatory Visit: Payer: Self-pay

## 2024-11-14 ENCOUNTER — Ambulatory Visit: Payer: Self-pay

## 2024-11-14 NOTE — Telephone Encounter (Signed)
 Noted EMS called

## 2024-11-14 NOTE — Telephone Encounter (Signed)
 FYI Only or Action Required?: FYI only for provider: ED advised and EMS called.  Patient was last seen in primary care on 07/13/2024 by [name], MD.  Called Nurse Triage reporting Back Pain.  Symptoms began yesterday. Worsened yesterday  Interventions attempted: Prescription medications: oxy.  Symptoms are: rapidly worsening.  Triage Disposition: Call EMS 911 Now  Patient/caregiver understands and will follow disposition?: Yes  Copied from CRM [id number]. Topic: Clinical - Red Word Triage >> Nov 14, 2024 12:06 PM China J wrote: [facility] that prompted transfer to Nurse Triage: Patient tried calling 911 but they are at critical capacity for ambulances. She is having severe back pain that buckles her legs and caused her to fall. Reason for Disposition  Sounds like a life-threatening emergency to the triager  Answer Assessment - Initial Assessment Questions Patient with extreme back pain. Started with hip pain about 3 weeks ago with worsened overnight last night. 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 Pain has progressed up her back, and now involving her neck. Unable to turn neck. Hurts to lift arms. Pain excruciating and having to log roll out of bed. Legs buckling. Legs are pale and cool.  Fell twice in last 24hrs- denies hitting head or other injury but was unable to move for before could get off the floor.  Denies SOB but reports pain takes her breath away. Denies CP but while on call reports numbness starting in left hand and pain in the shoulder blades that are piercing.  Prescription pain meds not denting pain level.   [place] [address] Called 911 earlier and a paramedic was sent out to do vitals and then told to call PCP.  EMS called with assistance from RN D.Cora. EMS sending unit out to house. Patient adamant she doesn't want to be transported to [place] and die. Advised they will  take her to the ED closest for urgent treatment. Patient ends call prior to EMS arrival to unlock the door and try to put up her dogs.  1. ONSET: When did the pain begin? (e.g., minutes, hours, days)     3 weeks ago- worsened over last night   2. LOCATION: Where does it hurt? (upper, mid or lower back)     Right back- from groin to the  3. SEVERITY: How bad is the pain?  (e.g., Scale 1-10; mild, moderate, or severe)     10/10 pain- off the charts 4. PATTERN: Is the pain constant? (e.g., yes, no; constant, intermittent)      constant 5. RADIATION: Does the pain shoot into your legs or somewhere else?     Groin all the way to her neck  6. CAUSE:  What do you think is causing the back pain?      Blood clots to the groin 7. BACK OVERUSE:  Any recent lifting of heavy objects, strenuous work or exercise?     denies 8. MEDICINES: What have you taken so far for the pain? (e.g., nothing, acetaminophen , NSAIDS)     Oxy, tylenol  9. NEUROLOGIC SYMPTOMS: Do you have any weakness, numbness, or problems with bowel/bladder control?     Unable to turn neck, lightening bolt/shock sensation down arms/legs on the right side  10. OTHER SYMPTOMS: Do you have any other symptoms? (e.g., fever, abdomen pain, burning with urination, blood in urine)       Chills but unsure about fever  Protocols used: Back  Pain-A-AH

## 2024-11-14 NOTE — Telephone Encounter (Signed)
 This RN contacted 911, provided dispatch with patient c/c and address. 911 confirmed EMS will be dispatched.

## 2024-11-18 ENCOUNTER — Ambulatory Visit: Payer: Self-pay

## 2024-11-18 DIAGNOSIS — M542 Cervicalgia: Secondary | ICD-10-CM

## 2024-11-18 NOTE — Telephone Encounter (Signed)
 FYI Only or Action Required?: Action required by provider: update on patient condition.  Patient was last seen in primary care on 07/13/2024 by [name], MD.  Called Nurse Triage reporting Back Pain.   Interventions attempted: Prescription medications: robaxin, lidocaine  5% patch, morphine, oxycodone  7.5mg  and Ice/heat application.  Symptoms are: gradually worsening.  Triage Disposition: Go to ED Now (Notify PCP)  Patient/caregiver understands and will follow disposition?: No, wishes to speak with PCP    Copied from CRM [id number]. Topic: Clinical - Red Word Triage >> Nov 18, 2024  5:02 PM Travis F wrote: Red Word that prompted transfer to Nurse Triage: Severe Pain    Patient is calling in because she is upset because she hasn't heard anything from the doctor since they sent her to the hospital. Patient says she needs an MRI and the hospital didn't give her one due to no tech being available or any spinal surgeon on call to assist. Patient is in so much pain and needs this done as soon as possible. Patient is requesting to speak with someone regarding this. She is also upset about not being able to be seen until the 19th and she is in pain. She says she needs this done immediately. Patient is feeling completely defeated and feels her office is dropping the ball on her. Patient is in excruciating pain, she says she can't move her head or her neck and her back is killing her.    Reason for Disposition  [1] SEVERE back pain (e.g., excruciating) AND [2] sudden onset AND [3] age > 60 years  Answer Assessment - Initial Assessment Questions Pt contacted clinic very upset as she was diverted to [facility] ED 11/14/24 and after ED admission was told she needed a spinal MRI within 2-3 days and evaluated by a spinal surgeon but hospital did not have beds to admit her and no tech no staff for MRI to be completed. Pt states she has not heard anything from clinic or PCP so she went to pain  management who added morphine to her pain regimen but did not offer any other advice or assistance. Pt states her pain is worsening, she is unable to walk without crying out in pain. Pt states she is very scared and frustrated that MRI has not been completed at this time. Reassured her that per her chart, no documents from [facility] were received and PCP unaware of MRI or events that have occurred this week. Discussed I would update PCP on need for MRI and referral to ortho. Discussed PCP may want to eval pt but ultimately with time of day and weekend closure, pt should return to ED. Pt states she has 4 days worth of pain medication and would like to wait to hear from PCP. States she relies upon medical transport for appts as she cannot drive so if PCP wants to see her it would need to be an afternoon. Pt states medical transport requires 3 days notice for appts but if she was aware first thing in the morning she could try to reach out to them or a friend. Reassured her to continue pain control regimen and if at any point she feels unsafe to call EMS. Pt voiced understanding.     1. ONSET: When did the pain begin? (e.g., minutes, hours, days)     Worsening since 11/14/24  2. LOCATION: Where does it hurt? (upper, mid or lower back)     Entire back   3. SEVERITY: How bad is  the pain?  (e.g., Scale 1-10; mild, moderate, or severe)     10  4. PATTERN: Is the pain constant? (e.g., yes, no; constant, intermittent)      Constant   6. CAUSE:  What do you think is causing the back pain?      Unsure   7. BACK OVERUSE:  Any recent lifting of heavy objects, strenuous work or exercise?     No   8. MEDICINES: What have you taken so far for the pain? (e.g., nothing, acetaminophen , NSAIDS)     Robaxin, lidocaine  5% patch, morphine, oxycodone  7.5mg , heat and ice rotated   9. NEUROLOGIC SYMPTOMS: Do you have any weakness, numbness, or problems with bowel/bladder control?     No   10. OTHER  SYMPTOMS: Do you have any other symptoms? (e.g., fever, abdomen pain, burning with urination, blood in urine)       No  Protocols used: Back Pain-A-AH

## 2024-11-21 ENCOUNTER — Encounter: Payer: Self-pay | Admitting: Family Medicine

## 2024-11-21 ENCOUNTER — Other Ambulatory Visit: Payer: Self-pay

## 2024-11-22 ENCOUNTER — Telehealth: Payer: Self-pay

## 2024-11-22 NOTE — Telephone Encounter (Signed)
 Patient sent mychart message, nurse will be back Wednesday

## 2024-11-22 NOTE — Telephone Encounter (Signed)
 Copied from CRM #[id number]. Topic: General - Other >> Nov 22, 2024 10:29 AM Joesph NOVAK wrote: Reason for CRM: patient is reaching back out wanting to know why she has not heard from a nurse or doctor since 1/5? Called cal to see if a nurse is avail to speak with patient. Left on hold. Please call patient back asap.

## 2024-11-23 NOTE — Telephone Encounter (Addendum)
 Pals get specific detaile from pt, how long she is having the pain, which leg radiaiting down and how far , I will attempt order mri if I have this

## 2024-11-23 NOTE — Telephone Encounter (Signed)
 Please review triage note. Has appt for HFU with Leita next week. Pt is requesting an MRI be ordered since [facility] hospital was unable to. Please advise

## 2024-11-24 NOTE — Telephone Encounter (Signed)
 Mri of neck is ordered, she has established arthritis ansd disc disease, most recent MRI of C spine was in 2022 Please let her know

## 2024-11-24 NOTE — Addendum Note (Signed)
 Addended by: ANTONETTA QUANT E on: 11/24/2024 12:23 PM   Modules accepted: Orders

## 2024-11-28 ENCOUNTER — Telehealth (INDEPENDENT_AMBULATORY_CARE_PROVIDER_SITE_OTHER): Payer: Self-pay

## 2024-11-28 ENCOUNTER — Encounter: Payer: Self-pay | Admitting: Family Medicine

## 2024-11-28 DIAGNOSIS — M542 Cervicalgia: Secondary | ICD-10-CM

## 2024-11-28 MED ORDER — METHOCARBAMOL 750 MG PO TABS: 750.0000 mg | ORAL_TABLET | Freq: Three times a day (TID) | ORAL | 3 refills | Status: AC | PRN

## 2024-11-28 NOTE — Progress Notes (Unsigned)
 "  Virtual Visit via Video Note  I connected with [name] on 12/01/24 at 11:00 AM EST by a video enabled telemedicine application and verified that I am speaking with the correct person using two identifiers.  Patient Location: Home Provider Location: Office/Clinic  I discussed the limitations, risks, security, and privacy concerns of performing an evaluation and management service by video and the availability of in person appointments. I also discussed with the patient that there may be a patient responsible charge related to this service. The patient expressed understanding and agreed to proceed.  Subjective: PCP: [name], MD  Chief Complaint  Patient presents with   Hospitalization Follow-up    Needs a referral for a spine surgeon, and a hospital follow up    HPI Discussed the use of AI scribe software for clinical note transcription with the patient, who gave verbal consent to proceed.  History of Present Illness    [name] is a 74 year old female with a history of spinal issues who presents with severe back pain and mobility issues.  Severe back pain and mobility impairment - Onset of severe back pain and mobility issues on November 13, 2024 - Initial pain affected legs, similar to prior arterial blockage requiring stents in April - Pain progressed to hips and lower back, resulting in inability to move from the waist down on November 14, 2024 - Attempts to stand caused severe pain and falls, necessitating emergency services - Ongoing pain radiates from the bottom of the shoulder blades to the skull - Associated 'crunching' sounds with movement of the head - Pain behind right eye when moving head - Fear of moving head due to concern for paralysis - Difficulty finding a comfortable sleeping position due to back pain  Neurological symptoms - Inability to move from the waist down on November 14, 2024 - Falls when attempting to stand - Pain radiating  from shoulder blades to skull - Pain behind right eye with head movement - No mention of numbness, tingling, or weakness in upper extremities  Prior vascular and spinal evaluation - Hospitalized at [facility] after falls and immobility - Ultrasound of legs reportedly normal - CT scan revealed spinal abnormalities - Discharged with prescriptions for lidocaine  patches and methocarbamol   Pain management and medication use - Prescribed lidocaine  patches and methocarbamol  after hospital discharge, unable to fill until following day - Visited pain management physician on November 15, 2024 - Started morphine 15 mg on November 16, 2024 - Continues oxycodone , ongoing for several months without adverse reactions - Current medications include oxycodone , trazodone , turmeric, and ginger supplements - No longer taking blood pressure medication - Allergic to acetaminophen  and steroids  Obstructive sleep apnea - History of obstructive sleep apnea  Social and functional limitations - Lives alone - Difficulty obtaining assistance - Seven-month wait for home health care     ROS: Per HPI Current Medications[1]  Observations/Objective: There were no vitals filed for this visit. Physical Exam  Assessment and Plan: Cervical pain Assessment & Plan: Chronic cervical and lumbar spinal stenosis with recent exacerbation. Severe radiating pain from lower back to neck. Recent CT scan showed spinal issues. Allergic to steroids and acetaminophen .  - Sent prescription for methocarbamol  to [facility]. - Advised her to call imaging department to expedite MRI scheduling. - Continue current pain management regimen with oxycodone  and morphine as needed. - Ensure follow-up with pain management on Thursday.  Orders: -     Methocarbamol ; Take  1 tablet (750 mg total) by mouth every 8 (eight) hours as needed for muscle spasms.  Dispense: 90 tablet; Refill: 3     Follow Up Instructions: No follow-ups on  file.   I discussed the assessment and treatment plan with the patient. The patient was provided an opportunity to ask questions, and all were answered. The patient agreed with the plan and demonstrated an understanding of the instructions.   The patient was advised to call back or seek an in-person evaluation if the symptoms worsen or if the condition fails to improve as anticipated.  The above assessment and management plan was discussed with the patient. The patient verbalized understanding of and has agreed to the management plan.   Amanda Longs, FNP     [1]  Current Outpatient Medications:    clopidogrel  (PLAVIX ) 75 MG tablet, Take 0.5 tablets (37.5 mg total) by mouth in the morning., Disp: 45 tablet, Rfl: 1   methocarbamol  (ROBAXIN ) 750 MG tablet, Take 1 tablet (750 mg total) by mouth every 8 (eight) hours as needed for muscle spasms., Disp: 90 tablet, Rfl: 3   oxyCODONE  (ROXICODONE ) 15 MG immediate release tablet, Take 15 mg by mouth every 6 (six) hours as needed for pain (cuts in half and takes 7.5mg )., Disp: , Rfl:    traZODone  (DESYREL ) 50 MG tablet, Take 50 mg by mouth at bedtime., Disp: , Rfl:   "

## 2024-11-30 ENCOUNTER — Telehealth: Payer: Self-pay

## 2024-11-30 NOTE — Telephone Encounter (Signed)
 Copied from CRM [id number]. Topic: Clinical - Medication Prior Auth >> Nov 30, 2024 12:22 PM Delon DASEN wrote: Reason for CRM: Patient needs PA for MRI w/o contrast scheduled for 1/23

## 2024-12-01 NOTE — Telephone Encounter (Unsigned)
 Copied from CRM [id number]. Topic: Clinical - Medication Prior Auth >> Nov 30, 2024 12:22 PM Delon DASEN wrote: Reason for CRM: Patient needs PA for MRI w/o contrast scheduled for 1/23

## 2024-12-01 NOTE — Assessment & Plan Note (Signed)
 Chronic cervical and lumbar spinal stenosis with recent exacerbation. Severe radiating pain from lower back to neck. Recent CT scan showed spinal issues. Allergic to steroids and acetaminophen .  - Sent prescription for methocarbamol  to [facility]. - Advised her to call imaging department to expedite MRI scheduling. - Continue current pain management regimen with oxycodone  and morphine as needed. - Ensure follow-up with pain management on Thursday.

## 2024-12-02 ENCOUNTER — Encounter: Payer: Self-pay | Admitting: Family Medicine

## 2024-12-02 ENCOUNTER — Ambulatory Visit (HOSPITAL_COMMUNITY)
Admission: RE | Admit: 2024-12-02 | Discharge: 2024-12-02 | Disposition: A | Source: Ambulatory Visit | Attending: Family Medicine | Admitting: Family Medicine

## 2024-12-02 DIAGNOSIS — M542 Cervicalgia: Secondary | ICD-10-CM | POA: Diagnosis present

## 2024-12-02 NOTE — Telephone Encounter (Signed)
 Authorization was received by Ezella SQUIBB yesterday (12/01/2024).

## 2024-12-03 ENCOUNTER — Ambulatory Visit: Payer: Self-pay | Admitting: Family Medicine

## 2024-12-03 ENCOUNTER — Ambulatory Visit (HOSPITAL_COMMUNITY)

## 2024-12-03 DIAGNOSIS — M5440 Lumbago with sciatica, unspecified side: Secondary | ICD-10-CM

## 2024-12-09 ENCOUNTER — Encounter: Admitting: Family Medicine

## 2024-12-13 ENCOUNTER — Ambulatory Visit (HOSPITAL_COMMUNITY)

## 2024-12-14 ENCOUNTER — Ambulatory Visit: Admitting: Student

## 2024-12-26 ENCOUNTER — Ambulatory Visit (INDEPENDENT_AMBULATORY_CARE_PROVIDER_SITE_OTHER): Admitting: Gastroenterology

## 2024-12-26 ENCOUNTER — Ambulatory Visit (INDEPENDENT_AMBULATORY_CARE_PROVIDER_SITE_OTHER): Payer: Medicare HMO | Admitting: Gastroenterology

## 2025-02-15 ENCOUNTER — Encounter: Payer: Self-pay | Admitting: Family Medicine

## 2025-02-23 ENCOUNTER — Ambulatory Visit: Admitting: Student

## 2025-03-14 ENCOUNTER — Ambulatory Visit (HOSPITAL_COMMUNITY)

## 2025-03-14 ENCOUNTER — Ambulatory Visit: Admitting: Vascular Surgery

## 2025-06-22 ENCOUNTER — Ambulatory Visit: Admitting: Internal Medicine

## 2025-07-18 ENCOUNTER — Ambulatory Visit
